# Patient Record
Sex: Female | Born: 1955 | Race: White | Hispanic: No | Marital: Married | Smoking: Never smoker
Health system: Southern US, Community
[De-identification: ages and names within clinical notes are randomized; demographics above are authoritative.]

## PROBLEM LIST (undated history)

## (undated) DIAGNOSIS — F419 Anxiety disorder, unspecified: Secondary | ICD-10-CM

## (undated) DIAGNOSIS — I10 Essential (primary) hypertension: Secondary | ICD-10-CM

## (undated) DIAGNOSIS — Z8719 Personal history of other diseases of the digestive system: Secondary | ICD-10-CM

## (undated) DIAGNOSIS — F329 Major depressive disorder, single episode, unspecified: Secondary | ICD-10-CM

## (undated) DIAGNOSIS — M1990 Unspecified osteoarthritis, unspecified site: Secondary | ICD-10-CM

## (undated) DIAGNOSIS — E039 Hypothyroidism, unspecified: Secondary | ICD-10-CM

## (undated) DIAGNOSIS — E7800 Pure hypercholesterolemia, unspecified: Secondary | ICD-10-CM

## (undated) DIAGNOSIS — K219 Gastro-esophageal reflux disease without esophagitis: Secondary | ICD-10-CM

## (undated) DIAGNOSIS — F32A Depression, unspecified: Secondary | ICD-10-CM

## (undated) HISTORY — PX: JOINT REPLACEMENT: SHX530

## (undated) HISTORY — PX: DILATION AND CURETTAGE OF UTERUS: SHX78

## (undated) HISTORY — PX: OTHER SURGICAL HISTORY: SHX169

## (undated) HISTORY — PX: TUBAL LIGATION: SHX77

## (undated) HISTORY — PX: CARDIAC CATHETERIZATION: SHX172

## (undated) HISTORY — PX: ABDOMINAL HYSTERECTOMY: SHX81

## (undated) HISTORY — PX: BREAST LUMPECTOMY: SHX2

---

## 1999-04-09 ENCOUNTER — Other Ambulatory Visit: Admission: RE | Admit: 1999-04-09 | Discharge: 1999-04-09 | Payer: Self-pay | Admitting: Family Medicine

## 2001-02-12 ENCOUNTER — Other Ambulatory Visit: Admission: RE | Admit: 2001-02-12 | Discharge: 2001-02-12 | Payer: Self-pay | Admitting: Family Medicine

## 2003-07-18 ENCOUNTER — Other Ambulatory Visit: Admission: RE | Admit: 2003-07-18 | Discharge: 2003-07-18 | Payer: Self-pay | Admitting: Family Medicine

## 2005-01-24 ENCOUNTER — Other Ambulatory Visit: Admission: RE | Admit: 2005-01-24 | Discharge: 2005-01-24 | Payer: Self-pay | Admitting: Family Medicine

## 2006-02-07 ENCOUNTER — Other Ambulatory Visit: Admission: RE | Admit: 2006-02-07 | Discharge: 2006-02-07 | Payer: Self-pay | Admitting: Family Medicine

## 2008-06-17 ENCOUNTER — Encounter: Payer: Self-pay | Admitting: Cardiology

## 2008-12-10 ENCOUNTER — Encounter: Payer: Self-pay | Admitting: Cardiology

## 2008-12-19 ENCOUNTER — Ambulatory Visit: Payer: Self-pay | Admitting: Cardiology

## 2008-12-19 DIAGNOSIS — R079 Chest pain, unspecified: Secondary | ICD-10-CM | POA: Insufficient documentation

## 2008-12-19 DIAGNOSIS — I10 Essential (primary) hypertension: Secondary | ICD-10-CM | POA: Insufficient documentation

## 2008-12-25 ENCOUNTER — Telehealth (INDEPENDENT_AMBULATORY_CARE_PROVIDER_SITE_OTHER): Payer: Self-pay | Admitting: *Deleted

## 2008-12-25 LAB — CONVERTED CEMR LAB
TSH: 0.56 microintl units/mL (ref 0.35–5.50)
Total CHOL/HDL Ratio: 5

## 2008-12-29 ENCOUNTER — Ambulatory Visit: Payer: Self-pay

## 2008-12-29 ENCOUNTER — Ambulatory Visit: Payer: Self-pay | Admitting: Cardiology

## 2008-12-29 ENCOUNTER — Encounter (HOSPITAL_COMMUNITY): Admission: RE | Admit: 2008-12-29 | Discharge: 2008-12-29 | Payer: Self-pay | Admitting: Cardiology

## 2008-12-31 ENCOUNTER — Encounter: Payer: Self-pay | Admitting: Cardiology

## 2009-01-01 ENCOUNTER — Ambulatory Visit: Payer: Self-pay | Admitting: Cardiology

## 2009-01-01 DIAGNOSIS — R9439 Abnormal result of other cardiovascular function study: Secondary | ICD-10-CM | POA: Insufficient documentation

## 2009-01-01 LAB — CONVERTED CEMR LAB
Basophils Relative: 0.9 % (ref 0.0–3.0)
CO2: 29 meq/L (ref 19–32)
Chloride: 98 meq/L (ref 96–112)
Eosinophils Absolute: 0.3 10*3/uL (ref 0.0–0.7)
Eosinophils Relative: 3.9 % (ref 0.0–5.0)
Hemoglobin: 13.2 g/dL (ref 12.0–15.0)
INR: 1 (ref 0.8–1.0)
MCHC: 33.4 g/dL (ref 30.0–36.0)
MCV: 85.8 fL (ref 78.0–100.0)
Monocytes Absolute: 0.5 10*3/uL (ref 0.1–1.0)
Neutro Abs: 3.9 10*3/uL (ref 1.4–7.7)
Potassium: 3.6 meq/L (ref 3.5–5.1)
RBC: 4.63 M/uL (ref 3.87–5.11)
Sodium: 135 meq/L (ref 135–145)
WBC: 6.8 10*3/uL (ref 4.5–10.5)

## 2009-01-05 ENCOUNTER — Inpatient Hospital Stay (HOSPITAL_BASED_OUTPATIENT_CLINIC_OR_DEPARTMENT_OTHER): Admission: RE | Admit: 2009-01-05 | Discharge: 2009-01-05 | Payer: Self-pay | Admitting: Cardiology

## 2009-01-05 ENCOUNTER — Ambulatory Visit: Payer: Self-pay | Admitting: Cardiology

## 2009-01-12 ENCOUNTER — Ambulatory Visit: Payer: Self-pay | Admitting: Cardiology

## 2012-01-30 ENCOUNTER — Encounter (INDEPENDENT_AMBULATORY_CARE_PROVIDER_SITE_OTHER): Payer: Self-pay | Admitting: *Deleted

## 2012-03-09 ENCOUNTER — Other Ambulatory Visit (INDEPENDENT_AMBULATORY_CARE_PROVIDER_SITE_OTHER): Payer: Self-pay | Admitting: *Deleted

## 2012-03-09 ENCOUNTER — Telehealth (INDEPENDENT_AMBULATORY_CARE_PROVIDER_SITE_OTHER): Payer: Self-pay | Admitting: *Deleted

## 2012-03-09 ENCOUNTER — Encounter (INDEPENDENT_AMBULATORY_CARE_PROVIDER_SITE_OTHER): Payer: Self-pay | Admitting: *Deleted

## 2012-03-09 DIAGNOSIS — Z1211 Encounter for screening for malignant neoplasm of colon: Secondary | ICD-10-CM

## 2012-03-09 MED ORDER — PEG-KCL-NACL-NASULF-NA ASC-C 100 G PO SOLR: 1.0000 | Freq: Once | ORAL | Status: DC

## 2012-03-09 NOTE — Telephone Encounter (Signed)
Patient needs movi prep 

## 2012-05-02 ENCOUNTER — Telehealth (INDEPENDENT_AMBULATORY_CARE_PROVIDER_SITE_OTHER): Payer: Self-pay | Admitting: *Deleted

## 2012-05-02 NOTE — Telephone Encounter (Signed)
  Procedure: tcs  Reason/Indication:  screening  Has patient had this procedure before?  no  If so, when, by whom and where?    Is there a family history of colon cancer?  no  Who?  What age when diagnosed?    Is patient diabetic?   no      Does patient have prosthetic heart valve?  no  Do you have a pacemaker?  no  Has patient had joint replacement within last 12 months?  no  Is patient on Coumadin, Plavix and/or Aspirin? yes  Medications: asa prn, advil, escitalopram 20 mg daily, atorvastatin 40 mg daily, benazepril/HCTZ 20/25 mg daily, levothyroxine 137 mg daily, clorazepate 3.75 mg daily, super B complex daily  Allergies: sulfur  Medication Adjustment: asa 2 days  Procedure date & time: 05/30/12 at 830

## 2012-05-02 NOTE — Telephone Encounter (Signed)
agree

## 2012-05-14 ENCOUNTER — Encounter (INDEPENDENT_AMBULATORY_CARE_PROVIDER_SITE_OTHER): Payer: Self-pay | Admitting: *Deleted

## 2012-05-29 ENCOUNTER — Encounter (HOSPITAL_COMMUNITY): Payer: Self-pay | Admitting: Pharmacy Technician

## 2012-05-30 ENCOUNTER — Encounter (HOSPITAL_COMMUNITY)
Admission: RE | Disposition: A | Discharge status: Home / Self Care | Payer: Self-pay | Source: Ambulatory Visit | Attending: Internal Medicine

## 2012-05-30 ENCOUNTER — Ambulatory Visit (HOSPITAL_COMMUNITY)
Admission: RE | Admit: 2012-05-30 | Discharge: 2012-05-30 | Disposition: A | Discharge status: Home / Self Care | Payer: BC Managed Care – PPO | Source: Ambulatory Visit | Attending: Internal Medicine | Admitting: Internal Medicine

## 2012-05-30 ENCOUNTER — Encounter (HOSPITAL_COMMUNITY): Payer: Self-pay | Admitting: *Deleted

## 2012-05-30 DIAGNOSIS — K644 Residual hemorrhoidal skin tags: Secondary | ICD-10-CM

## 2012-05-30 DIAGNOSIS — K5730 Diverticulosis of large intestine without perforation or abscess without bleeding: Secondary | ICD-10-CM

## 2012-05-30 DIAGNOSIS — D126 Benign neoplasm of colon, unspecified: Secondary | ICD-10-CM

## 2012-05-30 DIAGNOSIS — I10 Essential (primary) hypertension: Secondary | ICD-10-CM | POA: Insufficient documentation

## 2012-05-30 DIAGNOSIS — Z1211 Encounter for screening for malignant neoplasm of colon: Secondary | ICD-10-CM

## 2012-05-30 DIAGNOSIS — K645 Perianal venous thrombosis: Secondary | ICD-10-CM | POA: Insufficient documentation

## 2012-05-30 HISTORY — PX: COLONOSCOPY: SHX5424

## 2012-05-30 HISTORY — DX: Pure hypercholesterolemia, unspecified: E78.00

## 2012-05-30 HISTORY — DX: Essential (primary) hypertension: I10

## 2012-05-30 HISTORY — DX: Hypothyroidism, unspecified: E03.9

## 2012-05-30 HISTORY — DX: Major depressive disorder, single episode, unspecified: F32.9

## 2012-05-30 HISTORY — DX: Depression, unspecified: F32.A

## 2012-05-30 SURGERY — COLONOSCOPY
Anesthesia: Moderate Sedation

## 2012-05-30 MED ORDER — MIDAZOLAM HCL 5 MG/5ML IJ SOLN
INTRAMUSCULAR | Status: DC | PRN
  Administered 2012-05-30 (×2): 2 mg via INTRAVENOUS
  Administered 2012-05-30 (×2): 1 mg via INTRAVENOUS
  Administered 2012-05-30: 2 mg via INTRAVENOUS

## 2012-05-30 MED ORDER — SODIUM CHLORIDE 0.45 % IV SOLN: INTRAVENOUS | Status: DC

## 2012-05-30 MED ORDER — SODIUM CHLORIDE 0.9 % IV SOLN
INTRAVENOUS | Status: DC
  Administered 2012-05-30: 10:00:00 via INTRAVENOUS

## 2012-05-30 MED ORDER — MEPERIDINE HCL 50 MG/ML IJ SOLN
INTRAMUSCULAR | Status: AC
  Filled 2012-05-30: qty 1

## 2012-05-30 MED ORDER — MEPERIDINE HCL 50 MG/ML IJ SOLN
INTRAMUSCULAR | Status: DC | PRN
  Administered 2012-05-30 (×2): 25 mg via INTRAVENOUS

## 2012-05-30 MED ORDER — STERILE WATER FOR IRRIGATION IR SOLN
Status: DC | PRN
  Administered 2012-05-30: 10:00:00

## 2012-05-30 MED ORDER — MIDAZOLAM HCL 5 MG/5ML IJ SOLN
INTRAMUSCULAR | Status: AC
  Filled 2012-05-30: qty 10

## 2012-05-30 NOTE — Op Note (Signed)
COLONOSCOPY PROCEDURE REPORT  PATIENT:  Leslie Robbins  MR#:  [id number] Birthdate:  1955-09-30, 57 y.o., female Endoscopist:  Dr. Malissa Hippo, MD Referred By:  Ms. Bennie Pierini, FNP  Procedure Date: 05/30/2012  Procedure:   Colonoscopy  Indications:  Patient is 57 year old Caucasian female who is undergoing average risk screening colonoscopy. She has occasional hematochezia felt to be secondary to hemorrhoids.  Informed Consent:  The procedure and risks were reviewed with the patient and informed consent was obtained.  Medications:  Demerol 50 mg IV Versed 8 mg IV  Description of procedure:  After a digital rectal exam was performed, that colonoscope was advanced from the anus through the rectum and colon to the area of the cecum, ileocecal valve and appendiceal orifice. The cecum was deeply intubated. These structures were well-seen and photographed for the record. From the level of the cecum and ileocecal valve, the scope was slowly and cautiously withdrawn. The mucosal surfaces were carefully surveyed utilizing scope tip to flexion to facilitate fold flattening as needed. The scope was pulled down into the rectum where a thorough exam including retroflexion was performed.  Findings:   Prep excellent. Two small polyps ablated via cold biopsy and submitted together. These were located at cecum and ascending colon. Few diverticula at sigmoid colon. Normal rectal mucosa. Small hemorrhoids below the dentate line.    Therapeutic/Diagnostic Maneuvers Performed:  See above  Complications:  None  Cecal Withdrawal Time:  18 minutes  Impression:  Examination performed to cecum. Two small polyps ablated via cold biopsy and submitted together(cecum and ascending colon). Few diverticula at sigmoid colon. Small external hemorrhoids.  Recommendations:  Standard instructions given.  high fiber diet. I will contact patient with biopsy results and further  recommendations.  Fredderick Swanger U  05/30/2012 11:11 AM  CC: Dr. Bennie Pierini, FNP & Dr. Bonnetta Barry ref. provider found

## 2012-05-30 NOTE — H&P (Signed)
Leslie Robbins is an 57 y.o. female.   Chief Complaint: Patient is here for colonoscopy. HPI: Patient is 57 year old Caucasian female who is here for screening colonoscopy. She denies abdominal pain change in her bowel habits. She has occasional hematochezia. She had an episode one month ago another episode 6 months ago. She has good appetite and her weight has been stable. Family history is negative for colorectal carcinoma.  Past Medical History  Diagnosis Date  . Depression   . Hypothyroidism   . Hypertension   . Hypercholesteremia     Past Surgical History  Procedure Laterality Date  . Abdominal hysterectomy    . Tubal ligation    . Right carpal tunnel release    . Breast lumpectomy      benign    Family History  Problem Relation Age of Onset  . Colon cancer Neg Hx    Social History:  reports that she has never smoked. She does not have any smokeless tobacco history on file. She reports that she does not drink alcohol or use illicit drugs.  Allergies:  Allergies  Allergen Reactions  . Sulfonamide Derivatives     Medications Prior to Admission  Medication Sig Dispense Refill  . atorvastatin (LIPITOR) 40 MG tablet Take 40 mg by mouth at bedtime.      . benazepril-hydrochlorthiazide (LOTENSIN HCT) 20-25 MG per tablet Take 1 tablet by mouth daily.      Marland Kitchen escitalopram (LEXAPRO) 20 MG tablet Take 20 mg by mouth daily.      Marland Kitchen ibuprofen (ADVIL,MOTRIN) 200 MG tablet Take 600 mg by mouth daily.      Marland Kitchen levothyroxine (SYNTHROID, LEVOTHROID) 137 MCG tablet Take 137 mcg by mouth daily.      . peg 3350 powder (MOVIPREP) 100 G SOLR Take 1 kit (100 g total) by mouth once.  1 kit  0    No results found for this or any previous visit (from the past 48 hour(s)). No results found.  ROS  Blood pressure 154/86, pulse 68, temperature 97.6 F (36.4 C), temperature source Oral, resp. rate 20, height 5' 3.5" (1.613 m), weight 195 lb (88.451 kg), SpO2 95.00%. Physical Exam  Constitutional:  She appears well-developed and well-nourished.  HENT:  Mouth/Throat: Oropharynx is clear and moist.  Eyes: Conjunctivae are normal. No scleral icterus.  Neck: No thyromegaly present.  Cardiovascular: Normal rate, regular rhythm and normal heart sounds.   No murmur heard. Respiratory: Effort normal and breath sounds normal.  GI: Soft. Bowel sounds are normal.  Musculoskeletal: She exhibits no edema.  Lymphadenopathy:    She has no cervical adenopathy.  Neurological: She is alert.  Skin: Skin is warm and dry.     Assessment/Plan Average risk screening colonoscopy.  REHMAN,NAJEEB U 05/30/2012, 10:28 AM

## 2012-06-01 ENCOUNTER — Encounter (HOSPITAL_COMMUNITY): Payer: Self-pay | Admitting: Internal Medicine

## 2012-06-04 ENCOUNTER — Encounter (INDEPENDENT_AMBULATORY_CARE_PROVIDER_SITE_OTHER): Payer: Self-pay | Admitting: *Deleted

## 2012-06-11 ENCOUNTER — Other Ambulatory Visit: Payer: Self-pay | Admitting: *Deleted

## 2012-06-11 MED ORDER — ESCITALOPRAM OXALATE 20 MG PO TABS: 20.0000 mg | ORAL_TABLET | Freq: Every day | ORAL | Status: DC

## 2012-06-18 ENCOUNTER — Other Ambulatory Visit: Payer: Self-pay

## 2012-06-18 MED ORDER — LEVOTHYROXINE SODIUM 137 MCG PO TABS: 137.0000 ug | ORAL_TABLET | Freq: Every day | ORAL | Status: DC

## 2012-07-16 ENCOUNTER — Other Ambulatory Visit: Payer: Self-pay | Admitting: Nurse Practitioner

## 2012-07-16 NOTE — Telephone Encounter (Signed)
Patient last seen in office and had labs on 01-20-12. Please advise. Thank you

## 2012-08-12 ENCOUNTER — Other Ambulatory Visit: Payer: Self-pay | Admitting: Nurse Practitioner

## 2012-08-15 NOTE — Telephone Encounter (Signed)
Last seen 11/13.

## 2012-08-16 ENCOUNTER — Telehealth: Payer: Self-pay | Admitting: Nurse Practitioner

## 2012-08-16 MED ORDER — CLORAZEPATE DIPOTASSIUM 3.75 MG PO TABS: 3.7500 mg | ORAL_TABLET | Freq: Two times a day (BID) | ORAL | Status: DC | PRN

## 2012-08-16 NOTE — Telephone Encounter (Signed)
Paper chart says clorazepate 3.75 bid and last filled at [facility] in 09/2011

## 2012-08-16 NOTE — Telephone Encounter (Signed)
pleasecall in clorazepate rx with 0 refills

## 2012-08-16 NOTE — Telephone Encounter (Signed)
Not on med list meed to know when filled last and to what pharmacy

## 2012-08-16 NOTE — Telephone Encounter (Signed)
Rx called to [facility]. Patient notified

## 2012-08-31 ENCOUNTER — Other Ambulatory Visit: Payer: Self-pay | Admitting: Nurse Practitioner

## 2012-09-04 ENCOUNTER — Ambulatory Visit (INDEPENDENT_AMBULATORY_CARE_PROVIDER_SITE_OTHER): Payer: BC Managed Care – PPO

## 2012-09-04 ENCOUNTER — Ambulatory Visit (INDEPENDENT_AMBULATORY_CARE_PROVIDER_SITE_OTHER): Payer: BC Managed Care – PPO | Admitting: Physician Assistant

## 2012-09-04 ENCOUNTER — Encounter: Payer: Self-pay | Admitting: Physician Assistant

## 2012-09-04 VITALS — BP 147/83 | HR 62 | Temp 99.4°F | Ht 65.0 in | Wt 207.0 lb

## 2012-09-04 DIAGNOSIS — M25562 Pain in left knee: Secondary | ICD-10-CM

## 2012-09-04 DIAGNOSIS — M25569 Pain in unspecified knee: Secondary | ICD-10-CM

## 2012-09-04 MED ORDER — MELOXICAM 15 MG PO TABS: 15.0000 mg | ORAL_TABLET | Freq: Every day | ORAL | Status: DC

## 2012-09-04 NOTE — Patient Instructions (Signed)
Knee Pain  The knee is the complex joint between your thigh and your lower leg. It is made up of bones, tendons, ligaments, and cartilage. The bones that make up the knee are:   The femur in the thigh.   The tibia and fibula in the lower leg.   The patella or kneecap riding in the groove on the lower femur.  CAUSES   Knee pain is a common complaint with many causes. A few of these causes are:   Injury, such as:   A ruptured ligament or tendon injury.   Torn cartilage.   Medical conditions, such as:   Gout   Arthritis   Infections   Overuse, over training or overdoing a physical activity.  Knee pain can be minor or severe. Knee pain can accompany debilitating injury. Minor knee problems often respond well to self-care measures or get well on their own. More serious injuries may need medical intervention or even surgery.  SYMPTOMS  The knee is complex. Symptoms of knee problems can vary widely. Some of the problems are:   Pain with movement and weight bearing.   Swelling and tenderness.   Buckling of the knee.   Inability to straighten or extend your knee.   Your knee locks and you cannot straighten it.   Warmth and redness with pain and fever.   Deformity or dislocation of the kneecap.  DIAGNOSIS   Determining what is wrong may be very straight forward such as when there is an injury. It can also be challenging because of the complexity of the knee. Tests to make a diagnosis may include:   Your caregiver taking a history and doing a physical exam.   Routine X-rays can be used to rule out other problems. X-rays will not reveal a cartilage tear. Some injuries of the knee can be diagnosed by:   Arthroscopy a surgical technique by which a small video camera is inserted through tiny incisions on the sides of the knee. This procedure is used to examine and repair internal knee joint problems. Tiny instruments can be used during arthroscopy to repair the torn knee cartilage (meniscus).   Arthrography  is a radiology technique. A contrast liquid is directly injected into the knee joint. Internal structures of the knee joint then become visible on X-ray film.   An MRI scan is a non x-ray radiology procedure in which magnetic fields and a computer produce two- or three-dimensional images of the inside of the knee. Cartilage tears are often visible using an MRI scanner. MRI scans have largely replaced arthrography in diagnosing cartilage tears of the knee.   Blood work.   Examination of the fluid that helps to lubricate the knee joint (synovial fluid). This is done by taking a sample out using a needle and a syringe.  TREATMENT  The treatment of knee problems depends on the cause. Some of these treatments are:   Depending on the injury, proper casting, splinting, surgery or physical therapy care will be needed.   Give yourself adequate recovery time. Do not overuse your joints. If you begin to get sore during workout routines, back off. Slow down or do fewer repetitions.   For repetitive activities such as cycling or running, maintain your strength and nutrition.   Alternate muscle groups. For example if you are a weight lifter, work the upper body on one day and the lower body the next.   Either tight or weak muscles do not give the proper support for your   knee. Tight or weak muscles do not absorb the stress placed on the knee joint. Keep the muscles surrounding the knee strong.   Take care of mechanical problems.   If you have flat feet, orthotics or special shoes may help. See your caregiver if you need help.   Arch supports, sometimes with wedges on the inner or outer aspect of the heel, can help. These can shift pressure away from the side of the knee most bothered by osteoarthritis.   A brace called an "unloader" brace also may be used to help ease the pressure on the most arthritic side of the knee.   If your caregiver has prescribed crutches, braces, wraps or ice, use as directed. The acronym for  this is PRICE. This means protection, rest, ice, compression and elevation.   Nonsteroidal anti-inflammatory drugs (NSAID's), can help relieve pain. But if taken immediately after an injury, they may actually increase swelling. Take NSAID's with food in your stomach. Stop them if you develop stomach problems. Do not take these if you have a history of ulcers, stomach pain or bleeding from the bowel. Do not take without your caregiver's approval if you have problems with fluid retention, heart failure, or kidney problems.   For ongoing knee problems, physical therapy may be helpful.   Glucosamine and chondroitin are over-the-counter dietary supplements. Both may help relieve the pain of osteoarthritis in the knee. These medicines are different from the usual anti-inflammatory drugs. Glucosamine may decrease the rate of cartilage destruction.   Injections of a corticosteroid drug into your knee joint may help reduce the symptoms of an arthritis flare-up. They may provide pain relief that lasts a few months. You may have to wait a few months between injections. The injections do have a small increased risk of infection, water retention and elevated blood sugar levels.   Hyaluronic acid injected into damaged joints may ease pain and provide lubrication. These injections may work by reducing inflammation. A series of shots may give relief for as long as 6 months.   Topical painkillers. Applying certain ointments to your skin may help relieve the pain and stiffness of osteoarthritis. Ask your pharmacist for suggestions. Many over the-counter products are approved for temporary relief of arthritis pain.   In some countries, doctors often prescribe topical NSAID's for relief of chronic conditions such as arthritis and tendinitis. A review of treatment with NSAID creams found that they worked as well as oral medications but without the serious side effects.  PREVENTION   Maintain a healthy weight. Extra pounds put  more strain on your joints.   Get strong, stay limber. Weak muscles are a common cause of knee injuries. Stretching is important. Include flexibility exercises in your workouts.   Be smart about exercise. If you have osteoarthritis, chronic knee pain or recurring injuries, you may need to change the way you exercise. This does not mean you have to stop being active. If your knees ache after jogging or playing basketball, consider switching to swimming, water aerobics or other low-impact activities, at least for a few days a week. Sometimes limiting high-impact activities will provide relief.   Make sure your shoes fit well. Choose footwear that is right for your sport.   Protect your knees. Use the proper gear for knee-sensitive activities. Use kneepads when playing volleyball or laying carpet. Buckle your seat belt every time you drive. Most shattered kneecaps occur in car accidents.   Rest when you are tired.  SEEK MEDICAL CARE IF:     You have knee pain that is continual and does not seem to be getting better.   SEEK IMMEDIATE MEDICAL CARE IF:   Your knee joint feels hot to the touch and you have a high fever.  MAKE SURE YOU:    Understand these instructions.   Will watch your condition.   Will get help right away if you are not doing well or get worse.  Document Released: 12/19/2006 Document Revised: 05/16/2011 Document Reviewed: 12/19/2006  [employer] Patient Information 2014 [employer].

## 2012-09-04 NOTE — Progress Notes (Signed)
Subjective:     Patient ID: Leslie Robbins, female   DOB: 06-03-1955, 57 y.o.   MRN: [id number]  HPI Pt with intermit chronic medial L knee pain Sx improve when she is off of work Worsen w/in 2-3 days of being back on the job Denies any locking or giving way Sx are worse going down steps/hills She has used OTC NSAIDS and more recent OTC joint health med with some relief No prev surgery to the knee  Review of Systems  All other systems reviewed and are negative.       Objective:   Physical Exam  Nursing note and vitals reviewed. No effusion of the L knee Sl TTP along the medial joint line, no other TTP noted FROM of the knee with crepitus No laxity noted Lachman/[place] Scrape neg + Sx with patellar compression Good strength/sensory Xray- sig patellar and medial degen changes     Assessment:     1. Knee pain, left        Plan:     Heat/Ice Nl course reviewed with pt Can cont OTC med Mobic rx If sx cont will need referral to Ortho

## 2012-09-05 ENCOUNTER — Telehealth: Payer: Self-pay | Admitting: Physician Assistant

## 2012-09-05 DIAGNOSIS — M25562 Pain in left knee: Secondary | ICD-10-CM

## 2012-09-19 NOTE — Telephone Encounter (Signed)
Referral ordered. Patient aware.

## 2012-09-19 NOTE — Telephone Encounter (Signed)
Let her know I was out of the office last week so did not get message until today Please set up for Ortho

## 2012-10-05 ENCOUNTER — Other Ambulatory Visit: Payer: Self-pay

## 2012-10-05 DIAGNOSIS — M25562 Pain in left knee: Secondary | ICD-10-CM

## 2012-10-05 MED ORDER — MELOXICAM 15 MG PO TABS: 15.0000 mg | ORAL_TABLET | Freq: Every day | ORAL | Status: DC

## 2012-10-10 ENCOUNTER — Other Ambulatory Visit: Payer: Self-pay | Admitting: Nurse Practitioner

## 2012-10-23 ENCOUNTER — Encounter: Payer: Self-pay | Admitting: Nurse Practitioner

## 2012-10-23 ENCOUNTER — Ambulatory Visit (INDEPENDENT_AMBULATORY_CARE_PROVIDER_SITE_OTHER): Payer: BC Managed Care – PPO | Admitting: Nurse Practitioner

## 2012-10-23 VITALS — BP 139/85 | HR 60 | Temp 98.3°F | Ht 64.5 in | Wt 207.0 lb

## 2012-10-23 DIAGNOSIS — F32A Depression, unspecified: Secondary | ICD-10-CM

## 2012-10-23 DIAGNOSIS — F411 Generalized anxiety disorder: Secondary | ICD-10-CM

## 2012-10-23 DIAGNOSIS — E785 Hyperlipidemia, unspecified: Secondary | ICD-10-CM

## 2012-10-23 DIAGNOSIS — E039 Hypothyroidism, unspecified: Secondary | ICD-10-CM

## 2012-10-23 DIAGNOSIS — K219 Gastro-esophageal reflux disease without esophagitis: Secondary | ICD-10-CM

## 2012-10-23 DIAGNOSIS — Z0000 Encounter for general adult medical examination without abnormal findings: Secondary | ICD-10-CM

## 2012-10-23 DIAGNOSIS — F329 Major depressive disorder, single episode, unspecified: Secondary | ICD-10-CM

## 2012-10-23 DIAGNOSIS — I10 Essential (primary) hypertension: Secondary | ICD-10-CM

## 2012-10-23 LAB — POCT CBC
Hemoglobin: 13.9 g/dL (ref 12.2–16.2)
MPV: 8.3 fL (ref 0–99.8)
POC Granulocyte: 3 (ref 2–6.9)
POC LYMPH PERCENT: 40 %L (ref 10–50)
RBC: 4.8 M/uL (ref 4.04–5.48)

## 2012-10-23 MED ORDER — CLORAZEPATE DIPOTASSIUM 3.75 MG PO TABS: 3.7500 mg | ORAL_TABLET | Freq: Two times a day (BID) | ORAL | Status: DC | PRN

## 2012-10-23 MED ORDER — LEVOTHYROXINE SODIUM 137 MCG PO TABS: 137.0000 ug | ORAL_TABLET | Freq: Every day | ORAL | Status: DC

## 2012-10-23 MED ORDER — BENAZEPRIL-HYDROCHLOROTHIAZIDE 20-25 MG PO TABS: 1.0000 | ORAL_TABLET | Freq: Every day | ORAL | Status: DC

## 2012-10-23 MED ORDER — ESCITALOPRAM OXALATE 20 MG PO TABS: 20.0000 mg | ORAL_TABLET | Freq: Every day | ORAL | Status: DC

## 2012-10-23 MED ORDER — ATORVASTATIN CALCIUM 40 MG PO TABS: 40.0000 mg | ORAL_TABLET | Freq: Every day | ORAL | Status: DC

## 2012-10-23 MED ORDER — OMEPRAZOLE 40 MG PO CPDR: 40.0000 mg | DELAYED_RELEASE_CAPSULE | Freq: Every day | ORAL | Status: DC

## 2012-10-23 NOTE — Patient Instructions (Signed)

## 2012-10-23 NOTE — Progress Notes (Signed)
Subjective:    Patient ID: Leslie Robbins, female    DOB: 12-30-1955, 57 y.o.   MRN: [id number]   Patient in today for annual physical exam- No PAP- She is doing well  Hypertension This is a chronic problem. The current episode started more than 1 year ago. The problem is unchanged. The problem is controlled. Associated symptoms include anxiety. Pertinent negatives include no blurred vision, chest pain, neck pain, palpitations, peripheral edema or shortness of breath. There are no associated agents to hypertension. Risk factors for coronary artery disease include dyslipidemia, family history, obesity and post-menopausal state. Past treatments include ACE inhibitors and diuretics. The current treatment provides moderate improvement. Compliance problems include diet and exercise.  Hypertensive end-organ damage includes a thyroid problem.  Hyperlipidemia This is a chronic problem. The current episode started more than 1 year ago. The problem is controlled. Recent lipid tests were reviewed and are normal. Exacerbating diseases include hypothyroidism and obesity. She has no history of diabetes. Factors aggravating her hyperlipidemia include thiazides. Pertinent negatives include no chest pain, leg pain or shortness of breath. Current antihyperlipidemic treatment includes statins. The current treatment provides moderate improvement of lipids. Compliance problems include adherence to diet and adherence to exercise.  Risk factors for coronary artery disease include family history, hypertension, obesity and post-menopausal.  Thyroid Problem Presents for follow-up (hypothyroidism) visit. Patient reports no anxiety, depressed mood, diaphoresis, diarrhea, dry skin, fatigue, heat intolerance, menstrual problem, palpitations, visual change, weight gain or weight loss. The symptoms have been stable. Her past medical history is significant for hyperlipidemia. There is no history of diabetes.  Depression/GAD Currently on  lexaprodaily and clorazepate Qd-BID- Patient doing well right now- says that she has had no recent anxiety attacks.   Review of Systems  Constitutional: Negative for weight loss, weight gain, diaphoresis and fatigue.  HENT: Negative for neck pain.   Eyes: Negative for blurred vision.  Respiratory: Negative for shortness of breath.   Cardiovascular: Negative for chest pain and palpitations.  Gastrointestinal: Negative for diarrhea.  Endocrine: Negative for heat intolerance.  Genitourinary: Negative for menstrual problem.  All other systems reviewed and are negative.       Objective:   Physical Exam  Constitutional: She is oriented to person, place, and time. She appears well-developed and well-nourished.  HENT:  Nose: Nose normal.  Mouth/Throat: Oropharynx is clear and moist.  Eyes: EOM are normal.  Neck: Trachea normal, normal range of motion and full passive range of motion without pain. Neck supple. No JVD present. Carotid bruit is not present. No thyromegaly present.  Cardiovascular: Normal rate, regular rhythm, normal heart sounds and intact distal pulses.  Exam reveals no gallop and no friction rub.   No murmur heard. Pulmonary/Chest: Effort normal and breath sounds normal.  Abdominal: Soft. Bowel sounds are normal. She exhibits no distension and no mass. There is no tenderness.  Musculoskeletal: Normal range of motion.  Lymphadenopathy:    She has no cervical adenopathy.  Neurological: She is alert and oriented to person, place, and time. She has normal reflexes.  Skin: Skin is warm and dry.  Psychiatric: She has a normal mood and affect. Her behavior is normal. Judgment and thought content normal.   BP 139/85  Pulse 60  Temp(Src) 98.3 F (36.8 C) (Oral)  Ht 5' 4.5" (1.638 m)  Wt 207 lb (93.895 kg)  BMI 35 kg/m2        Assessment & Plan:  1. Annual physical exam  - POCT CBC - Thyroid  Panel With TSH  2. Hyperlipidemia Low fat diet and exercsie - NMR,  lipoprofile - atorvastatin (LIPITOR) 40 MG tablet; Take 1 tablet (40 mg total) by mouth daily.  Dispense: 30 tablet; Refill: 5  3. Hypertension Low NA+ diet - CMP14+EGFR - benazepril-hydrochlorthiazide (LOTENSIN HCT) 20-25 MG per tablet; Take 1 tablet by mouth daily.  Dispense: 30 tablet; Refill: 5  4. Depression Stress management - escitalopram (LEXAPRO) 20 MG tablet; Take 1 tablet (20 mg total) by mouth daily.  Dispense: 30 tablet; Refill: 5  5. GAD (generalized anxiety disorder) Stress menagement - clorazepate (TRANXENE) 3.75 MG tablet; Take 1 tablet (3.75 mg total) by mouth 2 (two) times daily as needed for anxiety.  Dispense: 30 tablet; Refill: 5  6. GERD (gastroesophageal reflux disease) AVoid spicy and fatty foods Do no eat 2 hours prior to bedtime - omeprazole (PRILOSEC) 40 MG capsule; Take 1 capsule (40 mg total) by mouth daily.  Dispense: 30 capsule; Refill: 5  7. Hypothyroidism  - levothyroxine (SYNTHROID, LEVOTHROID) 137 MCG tablet; Take 1 tablet (137 mcg total) by mouth daily.  Dispense: 30 tablet; Refill: 6  Health maintenance reviewed  Mary-Margaret Daphine Deutscher, FNP

## 2012-10-24 LAB — CMP14+EGFR
ALT: 339 IU/L — ABNORMAL HIGH (ref 0–32)
Albumin/Globulin Ratio: 1.4 (ref 1.1–2.5)
CO2: 26 mmol/L (ref 18–29)
Calcium: 9.8 mg/dL (ref 8.7–10.2)
Creatinine, Ser: 1.03 mg/dL — ABNORMAL HIGH (ref 0.57–1.00)
GFR calc non Af Amer: 60 mL/min/{1.73_m2} (ref >59)
Globulin, Total: 2.8 g/dL (ref 1.5–4.5)
Glucose: 69 mg/dL (ref 65–99)
Potassium: 4.5 mmol/L (ref 3.5–5.2)
Total Protein: 6.8 g/dL (ref 6.0–8.5)

## 2012-10-24 LAB — NMR, LIPOPROFILE
HDL Cholesterol by NMR: 54 mg/dL (ref >=40)
LDLC SERPL CALC-MCNC: 98 mg/dL (ref <100)
Small LDL Particle Number: 382 nmol/L (ref <=527)
Triglycerides by NMR: 95 mg/dL (ref <150)

## 2012-10-24 LAB — THYROID PANEL WITH TSH
Free Thyroxine Index: 3 (ref 1.2–4.9)
T3 Uptake Ratio: 25 % (ref 24–39)
T4, Total: 11.8 ug/dL (ref 4.5–12.0)

## 2013-01-06 ENCOUNTER — Other Ambulatory Visit: Payer: Self-pay | Admitting: Nurse Practitioner

## 2013-01-06 ENCOUNTER — Other Ambulatory Visit: Payer: Self-pay | Admitting: Physician Assistant

## 2013-03-11 ENCOUNTER — Other Ambulatory Visit: Payer: Self-pay | Admitting: Nurse Practitioner

## 2013-04-16 ENCOUNTER — Other Ambulatory Visit: Payer: Self-pay | Admitting: Nurse Practitioner

## 2013-04-30 ENCOUNTER — Other Ambulatory Visit: Payer: Self-pay | Admitting: Nurse Practitioner

## 2013-05-28 ENCOUNTER — Telehealth: Payer: Self-pay | Admitting: Nurse Practitioner

## 2013-05-28 NOTE — Telephone Encounter (Signed)
OTC mucinex and motrin

## 2013-05-29 ENCOUNTER — Encounter: Payer: Self-pay | Admitting: General Practice

## 2013-05-29 ENCOUNTER — Ambulatory Visit (INDEPENDENT_AMBULATORY_CARE_PROVIDER_SITE_OTHER): Payer: BC Managed Care – PPO | Admitting: Internal Medicine

## 2013-05-29 ENCOUNTER — Ambulatory Visit (INDEPENDENT_AMBULATORY_CARE_PROVIDER_SITE_OTHER): Payer: BC Managed Care – PPO | Admitting: General Practice

## 2013-05-29 VITALS — BP 117/83 | HR 76 | Temp 99.1°F | Ht 64.5 in | Wt 198.2 lb

## 2013-05-29 DIAGNOSIS — R059 Cough, unspecified: Secondary | ICD-10-CM

## 2013-05-29 DIAGNOSIS — J0100 Acute maxillary sinusitis, unspecified: Secondary | ICD-10-CM

## 2013-05-29 DIAGNOSIS — J101 Influenza due to other identified influenza virus with other respiratory manifestations: Secondary | ICD-10-CM

## 2013-05-29 DIAGNOSIS — R05 Cough: Secondary | ICD-10-CM

## 2013-05-29 DIAGNOSIS — J111 Influenza due to unidentified influenza virus with other respiratory manifestations: Secondary | ICD-10-CM

## 2013-05-29 DIAGNOSIS — R509 Fever, unspecified: Secondary | ICD-10-CM

## 2013-05-29 LAB — POCT RAPID STREP A (OFFICE): RAPID STREP A SCREEN: NEGATIVE

## 2013-05-29 LAB — POCT INFLUENZA A/B
INFLUENZA A, POC: NEGATIVE
INFLUENZA B, POC: POSITIVE

## 2013-05-29 MED ORDER — OSELTAMIVIR PHOSPHATE 75 MG PO CAPS: 75.0000 mg | ORAL_CAPSULE | Freq: Two times a day (BID) | ORAL | Status: DC

## 2013-05-29 MED ORDER — BENZONATATE 100 MG PO CAPS: 100.0000 mg | ORAL_CAPSULE | Freq: Three times a day (TID) | ORAL | Status: DC | PRN

## 2013-05-29 MED ORDER — AZITHROMYCIN 250 MG PO TABS: ORAL_TABLET | ORAL | Status: DC

## 2013-05-29 NOTE — Telephone Encounter (Signed)
Pt here now for appt for this

## 2013-05-29 NOTE — Progress Notes (Signed)
Subjective:    Patient ID: Leslie Robbins, female    DOB: 1955-04-11, 58 y.o.   MRN: [id number]  Cough This is a new problem. The current episode started in the past 7 days. The problem has been unchanged. The problem occurs every few minutes. The cough is non-productive. Associated symptoms include chills, a fever, headaches, nasal congestion, postnasal drip and a sore throat. Pertinent negatives include no chest pain, hemoptysis, shortness of breath or wheezing. The symptoms are aggravated by lying down. She has tried OTC cough suppressant for the symptoms. There is no history of asthma, bronchitis or pneumonia.  Fever  This is a new problem. The current episode started in the past 7 days. The problem occurs daily. The problem has been gradually improving. The maximum temperature noted was 101 to 101.9 F. The temperature was taken using an oral thermometer. Associated symptoms include coughing, headaches, muscle aches and a sore throat. Pertinent negatives include no chest pain, nausea, vomiting or wheezing.  Sore Throat  This is a new problem. The current episode started in the past 7 days. The problem has been gradually improving. Neither side of throat is experiencing more pain than the other. The pain is at a severity of 3/10. Associated symptoms include coughing and headaches. Pertinent negatives include no drooling, neck pain, shortness of breath or vomiting. She has had no exposure to strep or mono.      Review of Systems  Constitutional: Positive for fever and chills.  HENT: Positive for postnasal drip, sinus pressure and sore throat. Negative for drooling.   Respiratory: Positive for cough. Negative for hemoptysis, chest tightness, shortness of breath and wheezing.   Cardiovascular: Negative for chest pain.  Gastrointestinal: Negative for nausea and vomiting.  Musculoskeletal: Negative for neck pain.  Neurological: Positive for headaches.       Objective:   Physical Exam    Constitutional: She is oriented to person, place, and time. She appears well-developed and well-nourished.  HENT:  Head: Normocephalic and atraumatic.  Right Ear: External ear normal.  Left Ear: External ear normal.  Nose: Right sinus exhibits maxillary sinus tenderness. Left sinus exhibits maxillary sinus tenderness.  Pulmonary/Chest: Effort normal and breath sounds normal. No respiratory distress. She exhibits no tenderness.  Neurological: She is alert and oriented to person, place, and time.  Skin: Skin is warm and dry.  Psychiatric: She has a normal mood and affect.      Results for orders placed in visit on 05/29/13  POCT RAPID STREP A (OFFICE)      Result Value Ref Range   Rapid Strep A Screen Negative  Negative  POCT INFLUENZA A/B      Result Value Ref Range   Influenza A, POC Negative     Influenza B, POC Positive         Assessment & Plan:  1. Fever  - POCT rapid strep A - POCT Influenza A/B  2. Influenza B  - oseltamivir (TAMIFLU) 75 MG capsule; Take 1 capsule (75 mg total) by mouth 2 (two) times daily.  Dispense: 10 capsule; Refill: 0  3. Sinusitis, acute maxillary  - azithromycin (ZITHROMAX) 250 MG tablet; Take as directed  Dispense: 6 tablet; Refill: 0  4. Cough  - benzonatate (TESSALON) 100 MG capsule; Take 1 capsule (100 mg total) by mouth 3 (three) times daily as needed.  Dispense: 30 capsule; Refill: 0 -discussed adequate hydration -RTO if symptoms worsen or unresolved Patient verbalized understanding Erby Pian, FNP-C

## 2013-05-29 NOTE — Patient Instructions (Signed)

## 2013-06-04 ENCOUNTER — Other Ambulatory Visit: Payer: Self-pay | Admitting: General Practice

## 2013-06-13 ENCOUNTER — Other Ambulatory Visit: Payer: Self-pay | Admitting: *Deleted

## 2013-06-13 DIAGNOSIS — F32A Depression, unspecified: Secondary | ICD-10-CM

## 2013-06-13 DIAGNOSIS — F329 Major depressive disorder, single episode, unspecified: Secondary | ICD-10-CM

## 2013-06-13 MED ORDER — ESCITALOPRAM OXALATE 20 MG PO TABS: 20.0000 mg | ORAL_TABLET | Freq: Every day | ORAL | Status: DC

## 2013-07-11 ENCOUNTER — Encounter: Payer: Self-pay | Admitting: Nurse Practitioner

## 2013-07-11 ENCOUNTER — Ambulatory Visit (INDEPENDENT_AMBULATORY_CARE_PROVIDER_SITE_OTHER): Payer: BC Managed Care – PPO | Admitting: Nurse Practitioner

## 2013-07-11 ENCOUNTER — Ambulatory Visit (INDEPENDENT_AMBULATORY_CARE_PROVIDER_SITE_OTHER): Payer: BC Managed Care – PPO

## 2013-07-11 VITALS — BP 118/75 | HR 63 | Temp 98.4°F | Ht 64.5 in | Wt 200.8 lb

## 2013-07-11 DIAGNOSIS — F341 Dysthymic disorder: Secondary | ICD-10-CM

## 2013-07-11 DIAGNOSIS — F418 Other specified anxiety disorders: Secondary | ICD-10-CM

## 2013-07-11 DIAGNOSIS — I10 Essential (primary) hypertension: Secondary | ICD-10-CM

## 2013-07-11 DIAGNOSIS — Z01818 Encounter for other preprocedural examination: Secondary | ICD-10-CM

## 2013-07-11 DIAGNOSIS — F411 Generalized anxiety disorder: Secondary | ICD-10-CM

## 2013-07-11 DIAGNOSIS — K219 Gastro-esophageal reflux disease without esophagitis: Secondary | ICD-10-CM

## 2013-07-11 DIAGNOSIS — E039 Hypothyroidism, unspecified: Secondary | ICD-10-CM

## 2013-07-11 DIAGNOSIS — E785 Hyperlipidemia, unspecified: Secondary | ICD-10-CM

## 2013-07-11 LAB — POCT CBC
Granulocyte percent: 71.1 %G (ref 37–80)
HEMATOCRIT: 38.8 % (ref 37.7–47.9)
HEMOGLOBIN: 13.4 g/dL (ref 12.2–16.2)
Lymph, poc: 1.9 (ref 0.6–3.4)
MCH, POC: 29.9 pg (ref 27–31.2)
MCHC: 34.5 g/dL (ref 31.8–35.4)
MCV: 86.6 fL (ref 80–97)
MPV: 8.3 fL (ref 0–99.8)
POC GRANULOCYTE: 5 (ref 2–6.9)
POC LYMPH PERCENT: 26.9 %L (ref 10–50)
Platelet Count, POC: 206 10*3/uL (ref 142–424)
RBC: 4.5 M/uL (ref 4.04–5.48)
RDW, POC: 14.1 %
WBC: 7.1 10*3/uL (ref 4.6–10.2)

## 2013-07-11 NOTE — Progress Notes (Signed)
Subjective:    Patient ID: Leslie Robbins, female    DOB: August 30, 1955, 58 y.o.   MRN: [id number]  HPI Patient in today for surgical clearance- SHe is due to have a total knee replacement in august or 2015. SHe has no complaints today.  Hypertension This is a chronic problem. The current episode started more than 1 year ago. The problem is unchanged. The problem is controlled. Associated symptoms include anxiety. Pertinent negatives include no blurred vision, chest pain, neck pain, palpitations, peripheral edema or shortness of breath. There are no associated agents to hypertension. Risk factors for coronary artery disease include dyslipidemia, family history, obesity and post-menopausal state. Past treatments include ACE inhibitors and diuretics. The current treatment provides moderate improvement. Compliance problems include diet and exercise.  Hypertensive end-organ damage includes a thyroid problem.  Hyperlipidemia This is a chronic problem. The current episode started more than 1 year ago. The problem is controlled. Recent lipid tests were reviewed and are normal. Exacerbating diseases include hypothyroidism and obesity. She has no history of diabetes. Factors aggravating her hyperlipidemia include thiazides. Pertinent negatives include no chest pain, leg pain or shortness of breath. Current antihyperlipidemic treatment includes statins. The current treatment provides moderate improvement of lipids. Compliance problems include adherence to diet and adherence to exercise.  Risk factors for coronary artery disease include family history, hypertension, obesity and post-menopausal.  Thyroid Problem Presents for follow-up (hypothyroidism) visit. Patient reports no anxiety, depressed mood, diaphoresis, diarrhea, dry skin, fatigue, heat intolerance, menstrual problem, palpitations, visual change, weight gain or weight loss. The symptoms have been stable. Her past medical history is significant for  hyperlipidemia. There is no history of diabetes.  Depression/GAD Currently on lexapro daily and clorazepate Qd-BID- Patient doing well right now- says that she has had no recent anxiety attacks. GERD Omeprazole- works well to keep symptoms under control.  Review of Systems  Constitutional: Negative.   HENT: Negative.   Respiratory: Negative.  Negative for shortness of breath and wheezing.   Cardiovascular: Negative for chest pain and palpitations.  Gastrointestinal: Negative.   Genitourinary: Negative.   Neurological: Negative.   Psychiatric/Behavioral: Negative.   All other systems reviewed and are negative.      Objective:   Physical Exam  Constitutional: She is oriented to person, place, and time. She appears well-developed and well-nourished.  HENT:  Nose: Nose normal.  Mouth/Throat: Oropharynx is clear and moist.  Eyes: EOM are normal.  Neck: Trachea normal, normal range of motion and full passive range of motion without pain. Neck supple. No JVD present. Carotid bruit is not present. No thyromegaly present.  Cardiovascular: Normal rate, regular rhythm, normal heart sounds and intact distal pulses.  Exam reveals no gallop and no friction rub.   No murmur heard. Pulmonary/Chest: Effort normal and breath sounds normal.  Abdominal: Soft. Bowel sounds are normal. She exhibits no distension and no mass. There is no tenderness.  Musculoskeletal: Normal range of motion.  Lymphadenopathy:    She has no cervical adenopathy.  Neurological: She is alert and oriented to person, place, and time. She has normal reflexes.  Skin: Skin is warm and dry.  Psychiatric: She has a normal mood and affect. Her behavior is normal. Judgment and thought content normal.   BP 118/75  Pulse 63  Temp(Src) 98.4 F (36.9 C) (Oral)  Ht 5' 4.5" (1.638 m)  Wt 200 lb 12.8 oz (91.082 kg)  BMI 33.95 kg/m2 Chest x ray- bronchitic changes EKG- Sinus bradycardia  Assessment & Plan:   1.  Pre-operative clearance   2. GERD (gastroesophageal reflux disease)   3. GAD (generalized anxiety disorder)   4. Depression with anxiety   5. Hyperlipidemia LDL goal < 100   6. Hypothyroidism   7. ESSENTIAL HYPERTENSION    Orders Placed This Encounter  Procedures  . DG Chest 2 View    Standing Status: Future     Number of Occurrences: 1     Standing Expiration Date: 09/10/2014    Order Specific Question:  Reason for Exam (SYMPTOM  OR DIAGNOSIS REQUIRED)    Answer:  preop    Order Specific Question:  Is the patient pregnant?    Answer:  No    Order Specific Question:  Preferred imaging location?    Answer:  Internal  . CMP14+EGFR  . NMR, lipoprofile  . Thyroid Panel With TSH  . POCT CBC  . EKG 12-Lead    Labs pending Health maintenance reviewed Diet and exercise encouraged Continue all meds Follow up  In 6 months   [place], FNP

## 2013-07-11 NOTE — Patient Instructions (Signed)

## 2013-07-12 LAB — CMP14+EGFR
ALK PHOS: 134 IU/L — AB (ref 39–117)
ALT: 37 IU/L — ABNORMAL HIGH (ref 0–32)
AST: 41 IU/L — ABNORMAL HIGH (ref 0–40)
Albumin/Globulin Ratio: 1.5 (ref 1.1–2.5)
Albumin: 4.2 g/dL (ref 3.6–4.8)
BUN/Creatinine Ratio: 22 (ref 11–26)
BUN: 22 mg/dL (ref 8–27)
CHLORIDE: 102 mmol/L (ref 97–108)
CO2: 27 mmol/L (ref 18–29)
Calcium: 9.5 mg/dL (ref 8.7–10.3)
Creatinine, Ser: 0.99 mg/dL (ref 0.57–1.00)
GFR calc Af Amer: 72 mL/min/{1.73_m2} (ref >59)
GFR calc non Af Amer: 62 mL/min/{1.73_m2} (ref >59)
Globulin, Total: 2.8 g/dL (ref 1.5–4.5)
Glucose: 84 mg/dL (ref 65–99)
Potassium: 3.7 mmol/L (ref 3.5–5.2)
SODIUM: 142 mmol/L (ref 134–144)
Total Bilirubin: 0.8 mg/dL (ref 0.0–1.2)
Total Protein: 7 g/dL (ref 6.0–8.5)

## 2013-07-12 LAB — NMR, LIPOPROFILE
Cholesterol: 148 mg/dL (ref <200)
HDL Cholesterol by NMR: 52 mg/dL (ref >=40)
HDL PARTICLE NUMBER: 31.9 umol/L (ref >=30.5)
LDL PARTICLE NUMBER: 981 nmol/L (ref <1000)
LDL Size: 21.2 nm (ref >20.5)
LDLC SERPL CALC-MCNC: 80 mg/dL (ref <100)
Small LDL Particle Number: 313 nmol/L (ref <=527)
Triglycerides by NMR: 81 mg/dL (ref <150)

## 2013-07-12 LAB — THYROID PANEL WITH TSH
FREE THYROXINE INDEX: 3 (ref 1.2–4.9)
T3 Uptake Ratio: 30 % (ref 24–39)
T4, Total: 10 ug/dL (ref 4.5–12.0)
TSH: 1.28 u[IU]/mL (ref 0.450–4.500)

## 2013-08-19 ENCOUNTER — Other Ambulatory Visit: Payer: Self-pay | Admitting: Nurse Practitioner

## 2013-08-21 NOTE — Telephone Encounter (Signed)
Phoned refill request into pharmacy

## 2013-08-21 NOTE — Telephone Encounter (Signed)
Last refilled 04/18/13. Last office visit 07/11/13.

## 2013-08-21 NOTE — Telephone Encounter (Signed)
Please call in clorazepate with 1p refills

## 2013-08-27 NOTE — Progress Notes (Signed)
Please put orders in EPIC as patient haas pre-op appointment on 09/05/2013 at 11 am! Thank you!

## 2013-08-29 ENCOUNTER — Encounter (HOSPITAL_COMMUNITY): Payer: Self-pay | Admitting: Pharmacy Technician

## 2013-08-29 ENCOUNTER — Other Ambulatory Visit: Payer: Self-pay | Admitting: Orthopedic Surgery

## 2013-09-05 ENCOUNTER — Ambulatory Visit (HOSPITAL_COMMUNITY): Admission: RE | Admit: 2013-09-05 | Payer: BC Managed Care – PPO | Source: Ambulatory Visit

## 2013-09-05 ENCOUNTER — Encounter (HOSPITAL_COMMUNITY)
Admission: RE | Admit: 2013-09-05 | Discharge: 2013-09-05 | Disposition: A | Discharge status: Home / Self Care | Payer: BC Managed Care – PPO | Source: Ambulatory Visit | Attending: Orthopedic Surgery | Admitting: Orthopedic Surgery

## 2013-09-05 ENCOUNTER — Encounter (HOSPITAL_COMMUNITY): Payer: Self-pay

## 2013-09-05 DIAGNOSIS — Z01812 Encounter for preprocedural laboratory examination: Secondary | ICD-10-CM | POA: Insufficient documentation

## 2013-09-05 HISTORY — DX: Anxiety disorder, unspecified: F41.9

## 2013-09-05 HISTORY — DX: Gastro-esophageal reflux disease without esophagitis: K21.9

## 2013-09-05 HISTORY — DX: Unspecified osteoarthritis, unspecified site: M19.90

## 2013-09-05 LAB — URINALYSIS, ROUTINE W REFLEX MICROSCOPIC
Bilirubin Urine: NEGATIVE
Glucose, UA: NEGATIVE mg/dL
Hgb urine dipstick: NEGATIVE
Ketones, ur: NEGATIVE mg/dL
NITRITE: NEGATIVE
Protein, ur: NEGATIVE mg/dL
SPECIFIC GRAVITY, URINE: 1.011 (ref 1.005–1.030)
Urobilinogen, UA: 0.2 mg/dL (ref 0.0–1.0)
pH: 5.5 (ref 5.0–8.0)

## 2013-09-05 LAB — COMPREHENSIVE METABOLIC PANEL
ALBUMIN: 3.6 g/dL (ref 3.5–5.2)
ALT: 99 U/L — ABNORMAL HIGH (ref 0–35)
AST: 97 U/L — AB (ref 0–37)
Alkaline Phosphatase: 158 U/L — ABNORMAL HIGH (ref 39–117)
Anion gap: 13 (ref 5–15)
BUN: 19 mg/dL (ref 6–23)
CALCIUM: 10.3 mg/dL (ref 8.4–10.5)
CO2: 26 meq/L (ref 19–32)
Chloride: 104 mEq/L (ref 96–112)
Creatinine, Ser: 1.05 mg/dL (ref 0.50–1.10)
GFR calc Af Amer: 66 mL/min — ABNORMAL LOW (ref >90)
GFR, EST NON AFRICAN AMERICAN: 57 mL/min — AB (ref >90)
Glucose, Bld: 72 mg/dL (ref 70–99)
Potassium: 4.3 mEq/L (ref 3.7–5.3)
SODIUM: 143 meq/L (ref 137–147)
TOTAL PROTEIN: 7.6 g/dL (ref 6.0–8.3)
Total Bilirubin: 0.7 mg/dL (ref 0.3–1.2)

## 2013-09-05 LAB — URINE MICROSCOPIC-ADD ON

## 2013-09-05 LAB — CBC
HCT: 42.2 % (ref 36.0–46.0)
Hemoglobin: 13.5 g/dL (ref 12.0–15.0)
MCH: 28.8 pg (ref 26.0–34.0)
MCHC: 32 g/dL (ref 30.0–36.0)
MCV: 90.2 fL (ref 78.0–100.0)
PLATELETS: 196 10*3/uL (ref 150–400)
RBC: 4.68 MIL/uL (ref 3.87–5.11)
RDW: 14.2 % (ref 11.5–15.5)
WBC: 4.8 10*3/uL (ref 4.0–10.5)

## 2013-09-05 LAB — SURGICAL PCR SCREEN
MRSA, PCR: NEGATIVE
STAPHYLOCOCCUS AUREUS: NEGATIVE

## 2013-09-05 LAB — PROTIME-INR
INR: 0.93 (ref 0.00–1.49)
Prothrombin Time: 12.5 seconds (ref 11.6–15.2)

## 2013-09-05 LAB — APTT: aPTT: 29 seconds (ref 24–37)

## 2013-09-05 NOTE — Patient Instructions (Addendum)
Leslie Robbins  09/05/2013                           YOUR PROCEDURE IS SCHEDULED ON: 09/16/13 AT 12:40 PM               [facility] AND                            FOLLOW  SIGNS TO SHORT STAY CENTER                 ARRIVE AT SHORT STAY AT: 9:40 AM               CALL THIS NUMBER IF ANY PROBLEMS THE DAY OF SURGERY :               [phone]                                REMEMBER:   Do not eat food or drink liquids AFTER MIDNIGHT   May have clear liquids UNTIL 6 HOURS BEFORE SURGERY (6:40 AM)               Take these medicines the morning of surgery with               A SIPS OF WATER :   LEVOTHYROXINE / OMEPRAZOLE / ESCITALOPRAM / MAY TAKE CLORAZEPATE IF NEED      Do not wear jewelry, make-up   Do not wear lotions, powders, or perfumes.   Do not shave legs or underarms 12 hrs. before surgery (men may shave face)  Do not bring valuables to the hospital.  Contacts, dentures or bridgework may not be worn into surgery.  Leave suitcase in the car. After surgery it may be brought to your room.  For patients admitted to the hospital more than one night, checkout time is            11:00 AM                                                      ________________________________________________________________________                               CLEAR LIQUID DIET   Foods Allowed                                                                     Foods Excluded  Coffee and tea, regular and decaf                             liquids that you cannot  Plain Jell-O in any flavor  see through such as: Fruit ices (not with fruit pulp)                                     milk, soups, orange juice  Iced Popsicles                                    All solid food Carbonated beverages, regular and diet                                    Cranberry, grape and apple juices Sports drinks like Gatorade Lightly  seasoned clear broth or consume(fat free) Sugar, honey syrup  _____________________________________________________________________                [place]  Before surgery, you can play an important role.  Because skin is not sterile, your skin needs to be as free of germs as possible.  You can reduce the number of germs on your skin by washing with CHG (chlorahexidine gluconate) soap before surgery.  CHG is an antiseptic cleaner which kills germs and bonds with the skin to continue killing germs even after washing. Please DO NOT use if you have an allergy to CHG or antibacterial soaps.  If your skin becomes reddened/irritated stop using the CHG and inform your nurse when you arrive at Short Stay. Do not shave (including legs and underarms) for at least 48 hours prior to the first CHG shower.  You may shave your face. Please follow these instructions carefully:   1.  Shower with CHG Soap the night before surgery and the  morning of Surgery.   2.  If you choose to wash your hair, wash your hair first as usual with your  normal  Shampoo.   3.  After you shampoo, rinse your hair and body thoroughly to remove the  shampoo.                                         4.  Use CHG as you would any other liquid soap.  You can apply chg directly  to the skin and wash . Gently wash with scrungie or clean wascloth    5.  Apply the CHG Soap to your body ONLY FROM THE NECK DOWN.   Do not use on open                           Wound or open sores. Avoid contact with eyes, ears mouth and genitals (private parts).                        Genitals (private parts) with your normal soap.              6.  Wash thoroughly, paying special attention to the area where your surgery  will be performed.   7.  Thoroughly rinse your body with warm water from the neck down.   8.  DO NOT shower/wash with your normal soap after using and rinsing off  the CHG Soap .  9.  Pat yourself  dry with a clean towel.             10.  Wear clean pajamas.             11.  Place clean sheets on your bed the night of your first shower and do not  sleep with pets.  Day of Surgery : Do not apply any lotions/deodorants the morning of surgery.  Please wear clean clothes to the hospital/surgery center.  FAILURE TO FOLLOW THESE INSTRUCTIONS MAY RESULT IN THE CANCELLATION OF YOUR SURGERY    PATIENT SIGNATURE_________________________________  ______________________________________________________________________     Adam [place]ix  An incentive spirometer is a tool that can help keep your lungs clear and active. This tool measures how well you are filling your lungs with each breath. Taking long deep breaths may help reverse or decrease the chance of developing breathing (pulmonary) problems (especially infection) following:  A long period of time when you are unable to move or be active. BEFORE THE PROCEDURE   If the spirometer includes an indicator to show your best effort, your nurse or respiratory therapist will set it to a desired goal.  If possible, sit up straight or lean slightly forward. Try not to slouch.  Hold the incentive spirometer in an upright position. INSTRUCTIONS FOR USE  1. Sit on the edge of your bed if possible, or sit up as far as you can in bed or on a chair. 2. Hold the incentive spirometer in an upright position. 3. Breathe out normally. 4. Place the mouthpiece in your mouth and seal your lips tightly around it. 5. Breathe in slowly and as deeply as possible, raising the piston or the ball toward the top of the column. 6. Hold your breath for 3-5 seconds or for as long as possible. Allow the piston or ball to fall to the bottom of the column. 7. Remove the mouthpiece from your mouth and breathe out normally. 8. Rest for a few seconds and repeat Steps 1 through 7 at least 10 times every 1-2 hours when you are awake. Take your time and take a few  normal breaths between deep breaths. 9. The spirometer may include an indicator to show your best effort. Use the indicator as a goal to work toward during each repetition. 10. After each set of 10 deep breaths, practice coughing to be sure your lungs are clear. If you have an incision (the cut made at the time of surgery), support your incision when coughing by placing a pillow or rolled up towels firmly against it. Once you are able to get out of bed, walk around indoors and cough well. You may stop using the incentive spirometer when instructed by your caregiver.  RISKS AND COMPLICATIONS  Take your time so you do not get dizzy or light-headed.  If you are in pain, you may need to take or ask for pain medication before doing incentive spirometry. It is harder to take a deep breath if you are having pain. AFTER USE  Rest and breathe slowly and easily.  It can be helpful to keep track of a log of your progress. Your caregiver can provide you with a simple table to help with this. If you are using the spirometer at home, follow these instructions: [place] IF:   You are having difficultly using the spirometer.  You have trouble using the spirometer as often as instructed.  Your pain medication is not giving enough relief while  using the spirometer.  You develop fever of 100.5 F (38.1 C) or higher. SEEK IMMEDIATE MEDICAL CARE IF:   You cough up bloody sputum that had not been present before.  You develop fever of 102 F (38.9 C) or greater.  You develop worsening pain at or near the incision site. MAKE SURE YOU:   Understand these instructions.  Will watch your condition.  Will get help right away if you are not doing well or get worse. Document Released: 07/04/2006 Document Revised: 05/16/2011 Document Reviewed: 09/04/2006 ExitCare Patient Information 2014 Exit[employer], [place].   ________________________________________________________________________  WHAT IS A  BLOOD TRANSFUSION? Blood Transfusion Information  A transfusion is the replacement of blood or some of its parts. Blood is made up of multiple cells which provide different functions.  Red blood cells carry oxygen and are used for blood loss replacement.  White blood cells fight against infection.  Platelets control bleeding.  Plasma helps clot blood.  Other blood products are available for specialized needs, such as hemophilia or other clotting disorders. BEFORE THE TRANSFUSION  Who gives blood for transfusions?   Healthy volunteers who are fully evaluated to make sure their blood is safe. This is blood bank blood. Transfusion therapy is the safest it has ever been in the practice of medicine. Before blood is taken from a donor, a complete history is taken to make sure that person has no history of diseases nor engages in risky social behavior (examples are intravenous drug use or sexual activity with multiple partners). The donor's travel history is screened to minimize risk of transmitting infections, such as malaria. The donated blood is tested for signs of infectious diseases, such as HIV and hepatitis. The blood is then tested to be sure it is compatible with you in order to minimize the chance of a transfusion reaction. If you or a relative donates blood, this is often done in anticipation of surgery and is not appropriate for emergency situations. It takes many days to process the donated blood. RISKS AND COMPLICATIONS Although transfusion therapy is very safe and saves many lives, the main dangers of transfusion include:   Getting an infectious disease.  Developing a transfusion reaction. This is an allergic reaction to something in the blood you were given. Every precaution is taken to prevent this. The decision to have a blood transfusion has been considered carefully by your caregiver before blood is given. Blood is not given unless the benefits outweigh the risks. AFTER THE  TRANSFUSION  Right after receiving a blood transfusion, you will usually feel much better and more energetic. This is especially true if your red blood cells have gotten low (anemic). The transfusion raises the level of the red blood cells which carry oxygen, and this usually causes an energy increase.  The nurse administering the transfusion will monitor you carefully for complications. HOME CARE INSTRUCTIONS  No special instructions are needed after a transfusion. You may find your energy is better. Speak with your caregiver about any limitations on activity for underlying diseases you may have. SEEK MEDICAL CARE IF:   Your condition is not improving after your transfusion.  You develop redness or irritation at the intravenous (IV) site. SEEK IMMEDIATE MEDICAL CARE IF:  Any of the following symptoms occur over the next 12 hours:  Shaking chills.  You have a temperature by mouth above 102 F (38.9 C), not controlled by medicine.  Chest, back, or muscle pain.  People around you feel you are not acting correctly or  are confused.  Shortness of breath or difficulty breathing.  Dizziness and fainting.  You get a rash or develop hives.  You have a decrease in urine output.  Your urine turns a dark color or changes to pink, red, or brown. Any of the following symptoms occur over the next 10 days:  You have a temperature by mouth above 102 F (38.9 C), not controlled by medicine.  Shortness of breath.  Weakness after normal activity.  The white part of the eye turns yellow (jaundice).  You have a decrease in the amount of urine or are urinating less often.  Your urine turns a dark color or changes to pink, red, or brown. Document Released: 02/19/2000 Document Revised: 05/16/2011 Document Reviewed: 10/08/2007 [facility] Patient Information 2014 [place], [place].  _______________________________________________________________________

## 2013-09-11 NOTE — Progress Notes (Signed)
Did you really want a left hip xray on this pt since she is having a total left knee? I released the order then realized the order may not be correct but I can put it back in if you need the hip xray.

## 2013-09-15 ENCOUNTER — Other Ambulatory Visit: Payer: Self-pay | Admitting: Orthopedic Surgery

## 2013-09-15 NOTE — H&P (Signed)
Leslie Robbins w. Kittelson DOB: 1955-12-28 Married / Language: English / Race: White Female  Date of Admission:  09-16-2013 Chief Complaint:  Left Knee Pain History of Present Illness  The patient is a 58 year old female who comes in  for a preoperative history and physical. The patient is scheduled for a left total knee arthroplasty to be performed by Dr. Dione Plover. Aluisio, MD at [facility] on 09-16-2013. The patient is a 58 year old female who presents for follow up of their knee. The patient is being followed for their left knee pain and osteoarthritis. They are now month(s) out from Synvisc series. Symptoms reported today include: pain, pain in calf, locking, giving way, instability and difficulty ambulating. The patient feels that they are doing poorly and report their pain level to be moderate to severe. Current treatment includes: NSAIDs (meloxicam). The following medication has been used for pain control: Tylenol. The patient has reported improvement of their symptoms with: Cortisone injections (short term) and viscosupplementation (she reports her knees did not lock or catch as much after the visco, but the pain never really went away). The knee has been giving her more difficulty since her Synvisc has worn off now six months out. She would like to proceed with the total knee replacement at this time. They have been treated conservatively in the past for the above stated problem and despite conservative measures, they continue to have progressive pain and severe functional limitations and dysfunction. They have failed non-operative management including home exercise, medications, and injections. It is felt that they would benefit from undergoing total joint replacement. Risks and benefits of the procedure have been discussed with the patient and they elect to proceed with surgery. There are no active contraindications to surgery such as ongoing infection or rapidly progressive neurological  disease.  Allergies  Sulfathiazole *Sulfonamides** Itching, Rash.  Problem List/Past Medical Ok Edwards, III PA-C; 09/11/2013 2:00 PM) Primary osteoarthritis of left knee (715.16  M17.12) Osteoarthritis, Knee (715.96) (715.96  M17.9) Knee Pain (719.46  M25.569) Bursitis, hip (726.5  M70.70) Anxiety Disorder Depression Impaired Vision Hypertension Hypercholesterolemia Varicose veins Hiatal Hernia Gastroesophageal Reflux Disease Hemorrhoids Hepatitis Childhood with Jaundice Diverticulosis Hypothyroidism Measles   Family History Ok Edwards, III PA-C; Sep 11, 2013 2:06 PM) Father Deceased. age 30, Alcoholism, Suicide Mother Living. Lupus, Dementia  Social History Ok Edwards, III PA-C; September 11, 2013 2:03 PM) Tobacco use Former smoker. many years ago, but smoked very infrequently when she did Alcohol use Never consumed alcohol. Illicit drug use NONE Children 3 Living situation Lives with spouse. 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 Benazepril-Hydrochlorothiazide (20-25MG  Tablet, Oral) Active. (1 QD) Escitalopram Oxalate (20MG  Tablet, Oral) Active. (1 QD) Levothyroxine Sodium (137MCG Tablet, Oral) Active. (1 QD) Meloxicam (15MG  Tablet, Oral) Active. (QD) Clorazepate Dipotassium (3.75MG  Tablet, Oral) Active. (PRN) Atorvastatin Calcium (40MG  Tablet, Oral) Active. (1 QD)  Past Surgical History Ok Edwards, III PA-C; 11-Sep-2013 2:01 PM) Breast Biopsy Date: 1983. left Hysterectomy Date: 38. complete (non-cancerous) Carpal Tunnel Repair Date: 1982. right Tubal Ligation Date: 65. D & C Procedure 1976, 1993   Review of Systems (Alezandrew L. Roshelle Traub III PA-C; 09-11-13 1:56 PM) General Not Present- Chills,  Fatigue, Fever, Memory Loss, Night Sweats, Weight Gain and Weight Loss. Skin Not Present- Eczema, Hives, Itching, Lesions and Rash. HEENT Not Present- Dentures, Double Vision, Headache, Hearing Loss,  Tinnitus and Visual Loss. Respiratory Not Present- Allergies, Chronic Cough, Coughing up blood, Shortness of breath at rest and Shortness of breath with exertion. Cardiovascular Not Present- Chest Pain, Difficulty Breathing Lying Down, Murmur, Palpitations, Racing/skipping heartbeats and Swelling. Gastrointestinal Present- Nausea. Not Present- Abdominal Pain, Bloody Stool, Constipation, Diarrhea, Difficulty Swallowing, Heartburn, Jaundice, Loss of appetitie and Vomiting. Female Genitourinary Not Present- Blood in Urine, Discharge, Flank Pain, Incontinence, Painful Urination, Urgency, Urinary frequency, Urinary Retention, Urinating at Night and Weak urinary stream. Musculoskeletal Present- Back Pain, Joint Pain, Joint Swelling and Morning Stiffness. Not Present- Muscle Pain, Muscle Weakness and Spasms. Neurological Present- Headaches. Not Present- Blackout spells, Difficulty with balance, Dizziness, Paralysis, Tremor and Weakness. Psychiatric Not Present- Insomnia.   Vitals  Weight: 195 lb Height: 64in Weight was reported by patient. Height was reported by patient. Body Surface Area: 2 m Body Mass Index: 33.47 kg/m Pulse: 64 (Regular)  BP: 102/70 (Sitting, Left Arm, Standard)    Physical Exam (Alezandrew L. [place] Gadbois III PA-C; 09/05/2013 2:11 PM) General Mental Status -Alert, cooperative and good historian. General Appearance-pleasant, Not in acute distress. Orientation-Oriented X3. Build & Nutrition-Well nourished and Well developed.  Head and Neck Head-normocephalic, atraumatic . Neck Global Assessment - supple, no bruit auscultated on the right, no bruit auscultated on the left.  Eye Vision-Wears corrective lenses. Pupil - Bilateral-Regular and Round. Motion -  Bilateral-EOMI.  Chest and Lung Exam Auscultation Breath sounds - clear at anterior chest wall and clear at posterior chest wall. Adventitious sounds - No Adventitious sounds.  Cardiovascular Auscultation Rhythm - Regular rate and rhythm. Heart Sounds - S1 WNL and S2 WNL. Murmurs & Other Heart Sounds - Auscultation of the heart reveals - No Murmurs.  Abdomen Palpation/Percussion Tenderness - Abdomen is non-tender to palpation. Rigidity (guarding) - Abdomen is soft. Auscultation Auscultation of the abdomen reveals - Bowel sounds normal.  Female Genitourinary Note: Not done, not pertinent to present illness   Musculoskeletal Note: Today her knee shows slight varus deformity. She has crepitation throughout the range of motion. Tenderness in the medial joint line.  RADIOGRAPHS: x rays are obtained today and do show advanced patellofemoral and medial compartment arthritis, and moderately severe lateral compartment arthritis.    Assessment & Plan (Alezandrew L. Robbert Langlinais III PA-C; 09/05/2013 1:54 PM) Primary osteoarthritis of left knee (715.16  M17.12) Note:Plan is for a Left Total Knee Replacement by Dr. Wynelle Link.  Plan is to go home.  PCP - [facility], Ronnald Collum FNP  The patient does not have any contraindications and will receive TXA (tranexamic acid) prior to surgery.  Signed electronically by Ok Edwards, III PA-C

## 2013-09-16 ENCOUNTER — Encounter (HOSPITAL_COMMUNITY)
Admission: RE | Disposition: A | Discharge status: Home Health | Payer: Self-pay | Source: Ambulatory Visit | Attending: Orthopedic Surgery

## 2013-09-16 ENCOUNTER — Encounter (HOSPITAL_COMMUNITY): Payer: BC Managed Care – PPO | Admitting: Anesthesiology

## 2013-09-16 ENCOUNTER — Encounter (HOSPITAL_COMMUNITY): Payer: Self-pay | Admitting: *Deleted

## 2013-09-16 ENCOUNTER — Inpatient Hospital Stay (HOSPITAL_COMMUNITY): Payer: BC Managed Care – PPO | Admitting: Anesthesiology

## 2013-09-16 ENCOUNTER — Inpatient Hospital Stay (HOSPITAL_COMMUNITY)
Admission: RE | Admit: 2013-09-16 | Discharge: 2013-09-18 | DRG: 470 | Disposition: A | Discharge status: Home Health | Payer: BC Managed Care – PPO | Source: Ambulatory Visit | Attending: Orthopedic Surgery | Admitting: Orthopedic Surgery

## 2013-09-16 DIAGNOSIS — M1712 Unilateral primary osteoarthritis, left knee: Secondary | ICD-10-CM

## 2013-09-16 DIAGNOSIS — I10 Essential (primary) hypertension: Secondary | ICD-10-CM | POA: Diagnosis present

## 2013-09-16 DIAGNOSIS — F411 Generalized anxiety disorder: Secondary | ICD-10-CM

## 2013-09-16 DIAGNOSIS — F418 Other specified anxiety disorders: Secondary | ICD-10-CM

## 2013-09-16 DIAGNOSIS — M1710 Unilateral primary osteoarthritis, unspecified knee: Principal | ICD-10-CM | POA: Diagnosis present

## 2013-09-16 DIAGNOSIS — M179 Osteoarthritis of knee, unspecified: Secondary | ICD-10-CM | POA: Diagnosis present

## 2013-09-16 DIAGNOSIS — Z96652 Presence of left artificial knee joint: Secondary | ICD-10-CM

## 2013-09-16 DIAGNOSIS — Z01812 Encounter for preprocedural laboratory examination: Secondary | ICD-10-CM

## 2013-09-16 DIAGNOSIS — E038 Other specified hypothyroidism: Secondary | ICD-10-CM

## 2013-09-16 DIAGNOSIS — Z6834 Body mass index (BMI) 34.0-34.9, adult: Secondary | ICD-10-CM

## 2013-09-16 DIAGNOSIS — K219 Gastro-esophageal reflux disease without esophagitis: Secondary | ICD-10-CM | POA: Diagnosis present

## 2013-09-16 DIAGNOSIS — E039 Hypothyroidism, unspecified: Secondary | ICD-10-CM | POA: Diagnosis present

## 2013-09-16 DIAGNOSIS — E7800 Pure hypercholesterolemia, unspecified: Secondary | ICD-10-CM | POA: Diagnosis present

## 2013-09-16 HISTORY — PX: TOTAL KNEE ARTHROPLASTY: SHX125

## 2013-09-16 LAB — ABO/RH: ABO/RH(D): B POS

## 2013-09-16 LAB — TYPE AND SCREEN
ABO/RH(D): B POS
ANTIBODY SCREEN: NEGATIVE

## 2013-09-16 SURGERY — ARTHROPLASTY, KNEE, TOTAL
Anesthesia: Spinal | Site: Knee | Laterality: Left

## 2013-09-16 MED ORDER — DEXAMETHASONE SODIUM PHOSPHATE 10 MG/ML IJ SOLN
10.0000 mg | Freq: Every day | INTRAMUSCULAR | Status: AC
  Filled 2013-09-16: qty 1

## 2013-09-16 MED ORDER — TRANEXAMIC ACID 100 MG/ML IV SOLN
1000.0000 mg | INTRAVENOUS | Status: AC
  Administered 2013-09-16: 1000 mg via INTRAVENOUS
  Filled 2013-09-16: qty 10

## 2013-09-16 MED ORDER — METHOCARBAMOL 1000 MG/10ML IJ SOLN
500.0000 mg | Freq: Four times a day (QID) | INTRAMUSCULAR | Status: DC | PRN
  Administered 2013-09-16: 500 mg via INTRAVENOUS
  Filled 2013-09-16: qty 5

## 2013-09-16 MED ORDER — SODIUM CHLORIDE 0.9 % IR SOLN
Status: DC | PRN
  Administered 2013-09-16: 1000 mL

## 2013-09-16 MED ORDER — PROPOFOL INFUSION 10 MG/ML OPTIME
INTRAVENOUS | Status: DC | PRN
  Administered 2013-09-16: 75 ug/kg/min via INTRAVENOUS

## 2013-09-16 MED ORDER — PROPOFOL 10 MG/ML IV BOLUS
INTRAVENOUS | Status: AC
  Filled 2013-09-16: qty 20

## 2013-09-16 MED ORDER — CEFAZOLIN SODIUM-DEXTROSE 2-3 GM-% IV SOLR
INTRAVENOUS | Status: AC
  Filled 2013-09-16: qty 50

## 2013-09-16 MED ORDER — ONDANSETRON HCL 4 MG/2ML IJ SOLN
INTRAMUSCULAR | Status: AC
  Filled 2013-09-16: qty 2

## 2013-09-16 MED ORDER — HYDROMORPHONE HCL PF 1 MG/ML IJ SOLN
INTRAMUSCULAR | Status: AC
  Filled 2013-09-16: qty 1

## 2013-09-16 MED ORDER — 0.9 % SODIUM CHLORIDE (POUR BTL) OPTIME
TOPICAL | Status: DC | PRN
  Administered 2013-09-16: 1000 mL

## 2013-09-16 MED ORDER — BUPIVACAINE IN DEXTROSE 0.75-8.25 % IT SOLN
INTRATHECAL | Status: DC | PRN
  Administered 2013-09-16: 1.6 mL via INTRATHECAL

## 2013-09-16 MED ORDER — CLORAZEPATE DIPOTASSIUM 7.5 MG PO TABS
3.7500 mg | ORAL_TABLET | Freq: Two times a day (BID) | ORAL | Status: DC
  Administered 2013-09-16 – 2013-09-18 (×4): 3.75 mg via ORAL
  Filled 2013-09-16 (×4): qty 1

## 2013-09-16 MED ORDER — LIDOCAINE HCL (CARDIAC) 20 MG/ML IV SOLN
INTRAVENOUS | Status: DC | PRN
  Administered 2013-09-16: 50 mg via INTRAVENOUS

## 2013-09-16 MED ORDER — SODIUM CHLORIDE 0.9 % IJ SOLN
INTRAMUSCULAR | Status: DC | PRN
  Administered 2013-09-16: 30 mL

## 2013-09-16 MED ORDER — PHENOL 1.4 % MT LIQD
1.0000 | OROMUCOSAL | Status: DC | PRN
  Filled 2013-09-16: qty 177

## 2013-09-16 MED ORDER — MIDAZOLAM HCL 2 MG/2ML IJ SOLN
INTRAMUSCULAR | Status: AC
  Filled 2013-09-16: qty 2

## 2013-09-16 MED ORDER — ONDANSETRON HCL 4 MG/2ML IJ SOLN
INTRAMUSCULAR | Status: DC | PRN
  Administered 2013-09-16: 4 mg via INTRAVENOUS

## 2013-09-16 MED ORDER — METOCLOPRAMIDE HCL 5 MG/ML IJ SOLN: 5.0000 mg | Freq: Three times a day (TID) | INTRAMUSCULAR | Status: DC | PRN

## 2013-09-16 MED ORDER — FENTANYL CITRATE 0.05 MG/ML IJ SOLN
INTRAMUSCULAR | Status: DC | PRN
  Administered 2013-09-16: 100 ug via INTRAVENOUS

## 2013-09-16 MED ORDER — METHOCARBAMOL 500 MG PO TABS
500.0000 mg | ORAL_TABLET | Freq: Four times a day (QID) | ORAL | Status: DC | PRN
  Administered 2013-09-16 – 2013-09-18 (×5): 500 mg via ORAL
  Filled 2013-09-16 (×5): qty 1

## 2013-09-16 MED ORDER — FLEET ENEMA 7-19 GM/118ML RE ENEM: 1.0000 | ENEMA | Freq: Once | RECTAL | Status: AC | PRN

## 2013-09-16 MED ORDER — LACTATED RINGERS IV SOLN: INTRAVENOUS | Status: DC

## 2013-09-16 MED ORDER — BUPIVACAINE HCL 0.25 % IJ SOLN
INTRAMUSCULAR | Status: DC | PRN
  Administered 2013-09-16: 20 mL

## 2013-09-16 MED ORDER — LACTATED RINGERS IV SOLN
INTRAVENOUS | Status: DC
  Administered 2013-09-16: 14:00:00 via INTRAVENOUS
  Administered 2013-09-16: 1000 mL via INTRAVENOUS

## 2013-09-16 MED ORDER — SODIUM CHLORIDE 0.9 % IV SOLN: INTRAVENOUS | Status: DC

## 2013-09-16 MED ORDER — MENTHOL 3 MG MT LOZG
1.0000 | LOZENGE | OROMUCOSAL | Status: DC | PRN
  Filled 2013-09-16: qty 9

## 2013-09-16 MED ORDER — DEXAMETHASONE SODIUM PHOSPHATE 10 MG/ML IJ SOLN
10.0000 mg | Freq: Once | INTRAMUSCULAR | Status: AC
  Administered 2013-09-16: 10 mg via INTRAVENOUS

## 2013-09-16 MED ORDER — CEFAZOLIN SODIUM-DEXTROSE 2-3 GM-% IV SOLR
2.0000 g | INTRAVENOUS | Status: AC
  Administered 2013-09-16: 2 g via INTRAVENOUS

## 2013-09-16 MED ORDER — LEVOTHYROXINE SODIUM 137 MCG PO TABS
137.0000 ug | ORAL_TABLET | Freq: Every day | ORAL | Status: DC
  Administered 2013-09-17 – 2013-09-18 (×2): 137 ug via ORAL
  Filled 2013-09-16 (×3): qty 1

## 2013-09-16 MED ORDER — KETOROLAC TROMETHAMINE 15 MG/ML IJ SOLN
7.5000 mg | Freq: Four times a day (QID) | INTRAMUSCULAR | Status: DC | PRN
  Administered 2013-09-16: 7.5 mg via INTRAVENOUS
  Filled 2013-09-16: qty 1

## 2013-09-16 MED ORDER — PANTOPRAZOLE SODIUM 40 MG PO TBEC
80.0000 mg | DELAYED_RELEASE_TABLET | Freq: Every day | ORAL | Status: DC
  Administered 2013-09-16: 80 mg via ORAL
  Filled 2013-09-16 (×2): qty 2

## 2013-09-16 MED ORDER — BISACODYL 10 MG RE SUPP: 10.0000 mg | Freq: Every day | RECTAL | Status: DC | PRN

## 2013-09-16 MED ORDER — BUPIVACAINE HCL (PF) 0.25 % IJ SOLN
INTRAMUSCULAR | Status: AC
  Filled 2013-09-16: qty 30

## 2013-09-16 MED ORDER — ESCITALOPRAM OXALATE 20 MG PO TABS
20.0000 mg | ORAL_TABLET | Freq: Every morning | ORAL | Status: DC
  Administered 2013-09-17 – 2013-09-18 (×2): 20 mg via ORAL
  Filled 2013-09-16 (×2): qty 1

## 2013-09-16 MED ORDER — DIPHENHYDRAMINE HCL 12.5 MG/5ML PO ELIX: 12.5000 mg | ORAL_SOLUTION | ORAL | Status: DC | PRN

## 2013-09-16 MED ORDER — ATORVASTATIN CALCIUM 40 MG PO TABS
40.0000 mg | ORAL_TABLET | Freq: Every day | ORAL | Status: DC
  Administered 2013-09-16 – 2013-09-17 (×2): 40 mg via ORAL
  Filled 2013-09-16 (×3): qty 1

## 2013-09-16 MED ORDER — DEXAMETHASONE 6 MG PO TABS
10.0000 mg | ORAL_TABLET | Freq: Every day | ORAL | Status: AC
  Administered 2013-09-17: 10 mg via ORAL
  Filled 2013-09-16: qty 1

## 2013-09-16 MED ORDER — DOCUSATE SODIUM 100 MG PO CAPS
100.0000 mg | ORAL_CAPSULE | Freq: Two times a day (BID) | ORAL | Status: DC
  Administered 2013-09-16 – 2013-09-18 (×4): 100 mg via ORAL

## 2013-09-16 MED ORDER — OXYCODONE HCL 5 MG PO TABS
5.0000 mg | ORAL_TABLET | ORAL | Status: DC | PRN
  Administered 2013-09-16 (×2): 10 mg via ORAL
  Administered 2013-09-17: 5 mg via ORAL
  Administered 2013-09-17 – 2013-09-18 (×8): 10 mg via ORAL
  Filled 2013-09-16 (×11): qty 2

## 2013-09-16 MED ORDER — METOCLOPRAMIDE HCL 10 MG PO TABS: 5.0000 mg | ORAL_TABLET | Freq: Three times a day (TID) | ORAL | Status: DC | PRN

## 2013-09-16 MED ORDER — HYDROMORPHONE HCL PF 1 MG/ML IJ SOLN
0.2500 mg | INTRAMUSCULAR | Status: DC | PRN
  Administered 2013-09-16 (×3): 0.5 mg via INTRAVENOUS

## 2013-09-16 MED ORDER — BUPIVACAINE LIPOSOME 1.3 % IJ SUSP: 20.0000 mL | Freq: Once | INTRAMUSCULAR | Status: DC

## 2013-09-16 MED ORDER — CHLORHEXIDINE GLUCONATE 4 % EX LIQD: 60.0000 mL | Freq: Once | CUTANEOUS | Status: DC

## 2013-09-16 MED ORDER — MORPHINE SULFATE 2 MG/ML IJ SOLN: 1.0000 mg | INTRAMUSCULAR | Status: DC | PRN

## 2013-09-16 MED ORDER — ACETAMINOPHEN 10 MG/ML IV SOLN
1000.0000 mg | Freq: Once | INTRAVENOUS | Status: AC
  Administered 2013-09-16: 1000 mg via INTRAVENOUS
  Filled 2013-09-16: qty 100

## 2013-09-16 MED ORDER — BENAZEPRIL-HYDROCHLOROTHIAZIDE 20-25 MG PO TABS: 1.0000 | ORAL_TABLET | Freq: Every morning | ORAL | Status: DC

## 2013-09-16 MED ORDER — ONDANSETRON HCL 4 MG PO TABS: 4.0000 mg | ORAL_TABLET | Freq: Four times a day (QID) | ORAL | Status: DC | PRN

## 2013-09-16 MED ORDER — BENAZEPRIL HCL 20 MG PO TABS
20.0000 mg | ORAL_TABLET | Freq: Every day | ORAL | Status: DC
  Administered 2013-09-17 – 2013-09-18 (×2): 20 mg via ORAL
  Filled 2013-09-16 (×2): qty 1

## 2013-09-16 MED ORDER — ONDANSETRON HCL 4 MG/2ML IJ SOLN: 4.0000 mg | Freq: Four times a day (QID) | INTRAMUSCULAR | Status: DC | PRN

## 2013-09-16 MED ORDER — MIDAZOLAM HCL 5 MG/5ML IJ SOLN
INTRAMUSCULAR | Status: DC | PRN
  Administered 2013-09-16: 2 mg via INTRAVENOUS

## 2013-09-16 MED ORDER — RIVAROXABAN 10 MG PO TABS
10.0000 mg | ORAL_TABLET | Freq: Every day | ORAL | Status: DC
  Administered 2013-09-17 – 2013-09-18 (×2): 10 mg via ORAL
  Filled 2013-09-16 (×3): qty 1

## 2013-09-16 MED ORDER — BUPIVACAINE LIPOSOME 1.3 % IJ SUSP
20.0000 mL | Freq: Once | INTRAMUSCULAR | Status: DC
  Filled 2013-09-16: qty 20

## 2013-09-16 MED ORDER — DEXAMETHASONE SODIUM PHOSPHATE 10 MG/ML IJ SOLN
INTRAMUSCULAR | Status: AC
  Filled 2013-09-16: qty 1

## 2013-09-16 MED ORDER — BUPIVACAINE LIPOSOME 1.3 % IJ SUSP
INTRAMUSCULAR | Status: DC | PRN
  Administered 2013-09-16: 20 mL

## 2013-09-16 MED ORDER — CEFAZOLIN SODIUM-DEXTROSE 2-3 GM-% IV SOLR
2.0000 g | Freq: Four times a day (QID) | INTRAVENOUS | Status: AC
  Administered 2013-09-16 – 2013-09-17 (×2): 2 g via INTRAVENOUS
  Filled 2013-09-16 (×3): qty 50

## 2013-09-16 MED ORDER — POLYETHYLENE GLYCOL 3350 17 G PO PACK: 17.0000 g | PACK | Freq: Every day | ORAL | Status: DC | PRN

## 2013-09-16 MED ORDER — SODIUM CHLORIDE 0.9 % IJ SOLN
INTRAMUSCULAR | Status: AC
  Filled 2013-09-16: qty 50

## 2013-09-16 MED ORDER — LIDOCAINE HCL (CARDIAC) 20 MG/ML IV SOLN
INTRAVENOUS | Status: AC
  Filled 2013-09-16: qty 5

## 2013-09-16 MED ORDER — FENTANYL CITRATE 0.05 MG/ML IJ SOLN
INTRAMUSCULAR | Status: AC
  Filled 2013-09-16: qty 2

## 2013-09-16 MED ORDER — HYDROCHLOROTHIAZIDE 25 MG PO TABS
25.0000 mg | ORAL_TABLET | Freq: Every day | ORAL | Status: DC
  Administered 2013-09-17 – 2013-09-18 (×2): 25 mg via ORAL
  Filled 2013-09-16 (×2): qty 1

## 2013-09-16 MED ORDER — DEXTROSE-NACL 5-0.45 % IV SOLN
INTRAVENOUS | Status: DC
  Administered 2013-09-16 – 2013-09-17 (×2): via INTRAVENOUS

## 2013-09-16 SURGICAL SUPPLY — 65 items
BAG SPEC THK2 15X12 ZIP CLS (MISCELLANEOUS) ×1
BAG ZIPLOCK 12X15 (MISCELLANEOUS) ×2 IMPLANT
BANDAGE ELASTIC 6 VELCRO ST LF (GAUZE/BANDAGES/DRESSINGS) ×2 IMPLANT
BANDAGE ESMARK 6X9 LF (GAUZE/BANDAGES/DRESSINGS) ×1 IMPLANT
BLADE SAG 18X100X1.27 (BLADE) ×2 IMPLANT
BLADE SAW SGTL 11.0X1.19X90.0M (BLADE) ×2 IMPLANT
BNDG CMPR 9X6 STRL LF SNTH (GAUZE/BANDAGES/DRESSINGS) ×1
BNDG ESMARK 6X9 LF (GAUZE/BANDAGES/DRESSINGS) ×2
BOWL SMART MIX CTS (DISPOSABLE) ×2 IMPLANT
CAP KNEE ATTUNE RP ×1 IMPLANT
CEMENT HV SMART SET (Cement) ×4 IMPLANT
CUFF TOURN SGL QUICK 34 (TOURNIQUET CUFF) ×2
CUFF TRNQT CYL 34X4X40X1 (TOURNIQUET CUFF) ×1 IMPLANT
DECANTER SPIKE VIAL GLASS SM (MISCELLANEOUS) ×2 IMPLANT
DRAPE EXTREMITY T 121X128X90 (DRAPE) ×2 IMPLANT
DRAPE POUCH INSTRU U-SHP 10X18 (DRAPES) ×2 IMPLANT
DRAPE U-SHAPE 47X51 STRL (DRAPES) ×2 IMPLANT
DRSG ADAPTIC 3X8 NADH LF (GAUZE/BANDAGES/DRESSINGS) ×2 IMPLANT
DRSG PAD ABDOMINAL 8X10 ST (GAUZE/BANDAGES/DRESSINGS) ×2 IMPLANT
DURAPREP 26ML APPLICATOR (WOUND CARE) ×2 IMPLANT
ELECT REM PT RETURN 9FT ADLT (ELECTROSURGICAL) ×2
ELECTRODE REM PT RTRN 9FT ADLT (ELECTROSURGICAL) ×1 IMPLANT
EVACUATOR 1/8 PVC DRAIN (DRAIN) ×2 IMPLANT
FACESHIELD WRAPAROUND (MASK) ×10 IMPLANT
FACESHIELD WRAPAROUND OR TEAM (MASK) ×5 IMPLANT
GAUZE SPONGE 4X4 12PLY STRL (GAUZE/BANDAGES/DRESSINGS) ×2 IMPLANT
GLOVE BIO SURGEON STRL SZ7.5 (GLOVE) ×1 IMPLANT
GLOVE BIO SURGEON STRL SZ8 (GLOVE) ×2 IMPLANT
GLOVE BIOGEL PI IND STRL 6.5 (GLOVE) ×2 IMPLANT
GLOVE BIOGEL PI IND STRL 7.5 (GLOVE) IMPLANT
GLOVE BIOGEL PI IND STRL 8 (GLOVE) ×1 IMPLANT
GLOVE BIOGEL PI INDICATOR 6.5 (GLOVE) ×2
GLOVE BIOGEL PI INDICATOR 7.5 (GLOVE) ×1
GLOVE BIOGEL PI INDICATOR 8 (GLOVE) ×2
GLOVE SURG SS PI 6.5 STRL IVOR (GLOVE) ×1 IMPLANT
GLOVE SURG SS PI 7.5 STRL IVOR (GLOVE) ×2 IMPLANT
GOWN STRL REIN 2XL XLG LVL4 (GOWN DISPOSABLE) ×1 IMPLANT
GOWN STRL REUS W/TWL LRG LVL3 (GOWN DISPOSABLE) ×2 IMPLANT
GOWN STRL REUS W/TWL XL LVL3 (GOWN DISPOSABLE) ×2 IMPLANT
HANDPIECE INTERPULSE COAX TIP (DISPOSABLE) ×2
IMMOBILIZER KNEE 20 (SOFTGOODS) ×2
IMMOBILIZER KNEE 20 THIGH 36 (SOFTGOODS) ×1 IMPLANT
KIT BASIN OR (CUSTOM PROCEDURE TRAY) ×2 IMPLANT
MANIFOLD NEPTUNE II (INSTRUMENTS) ×2 IMPLANT
NDL SAFETY ECLIPSE 18X1.5 (NEEDLE) ×2 IMPLANT
NEEDLE HYPO 18GX1.5 SHARP (NEEDLE) ×4
NS IRRIG 1000ML POUR BTL (IV SOLUTION) ×2 IMPLANT
PACK TOTAL JOINT (CUSTOM PROCEDURE TRAY) ×2 IMPLANT
PADDING CAST COTTON 6X4 STRL (CAST SUPPLIES) ×3 IMPLANT
POSITIONER SURGICAL ARM (MISCELLANEOUS) ×2 IMPLANT
SET HNDPC FAN SPRY TIP SCT (DISPOSABLE) ×1 IMPLANT
SPONGE GAUZE 4X4 12PLY (GAUZE/BANDAGES/DRESSINGS) ×1 IMPLANT
STRIP CLOSURE SKIN 1/2X4 (GAUZE/BANDAGES/DRESSINGS) ×3 IMPLANT
SUCTION FRAZIER 12FR DISP (SUCTIONS) ×2 IMPLANT
SUT MNCRL AB 4-0 PS2 18 (SUTURE) ×2 IMPLANT
SUT VIC AB 2-0 CT1 27 (SUTURE) ×6
SUT VIC AB 2-0 CT1 TAPERPNT 27 (SUTURE) ×3 IMPLANT
SUT VLOC 180 0 24IN GS25 (SUTURE) ×2 IMPLANT
SYRINGE 20CC LL (MISCELLANEOUS) ×2 IMPLANT
SYRINGE 60CC LL (MISCELLANEOUS) ×2 IMPLANT
TOWEL OR 17X26 10 PK STRL BLUE (TOWEL DISPOSABLE) ×2 IMPLANT
TOWEL OR NON WOVEN STRL DISP B (DISPOSABLE) ×2 IMPLANT
TRAY FOLEY CATH 14FRSI W/METER (CATHETERS) ×2 IMPLANT
WATER STERILE IRR 1500ML POUR (IV SOLUTION) ×2 IMPLANT
WRAP KNEE MAXI GEL POST OP (GAUZE/BANDAGES/DRESSINGS) ×2 IMPLANT

## 2013-09-16 NOTE — Op Note (Signed)
Pre-operative diagnosis- Osteoarthritis  Left knee(s)  Post-operative diagnosis- Osteoarthritis Left knee(s)  Procedure-  Left  Total Knee Arthroplasty (Attune System)  Surgeon- Dione Plover. Mahki Spikes, MD  Assistant- Arlee Muslim, PA-C   Anesthesia-  Spinal  EBL-* No blood loss amount entered *   Drains Hemovac  Tourniquet time-  Total Tourniquet Time Documented: Thigh (Left) - 39 minutes Total: Thigh (Left) - 39 minutes     Complications- None  Condition-PACU - hemodynamically stable.   Brief Clinical Note  Leslie Robbins is a 58 y.o. year old female with end stage OA of her left knee with progressively worsening pain and dysfunction. She has constant pain, with activity and at rest and significant functional deficits with difficulties even with ADLs. She has had extensive non-op management including analgesics, injections of cortisone and viscosupplements, and home exercise program, but remains in significant pain with significant dysfunction. Radiographs show bone on bone arthritis medial and patellofemoral. She presents now for left Total Knee Arthroplasty.    Procedure in detail---   The patient is brought into the operating room and positioned supine on the operating table. After successful administration of  Spinal,   a tourniquet is placed high on the  Left thigh(s) and the lower extremity is prepped and draped in the usual sterile fashion. Time out is performed by the operating team and then the  Left lower extremity is wrapped in Esmarch, knee flexed and the tourniquet inflated to 300 mmHg.       A midline incision is made with a ten blade through the subcutaneous tissue to the level of the extensor mechanism. A fresh blade is used to make a medial parapatellar arthrotomy. Soft tissue over the proximal medial tibia is subperiosteally elevated to the joint line with a knife and into the semimembranosus bursa with a Cobb elevator. Soft tissue over the proximal lateral tibia is elevated  with attention being paid to avoiding the patellar tendon on the tibial tubercle. The patella is everted, knee flexed 90 degrees and the ACL and PCL are removed. Findings are bone on bone medial and patellofemoral with massive global osteophytes.        The drill is used to create a starting hole in the distal femur and the canal is thoroughly irrigated with sterile saline to remove the fatty contents. The 5 degree Left  valgus alignment guide is placed into the femoral canal and the distal femoral cutting block is pinned to remove 9 mm off the distal femur. Resection is made with an oscillating saw.      The tibia is subluxed forward and the menisci are removed. The extramedullary alignment guide is placed referencing proximally at the medial aspect of the tibial tubercle and distally along the second metatarsal axis and tibial crest. The block is pinned to remove 28mm off the more deficient medial  side. Resection is made with an oscillating saw. Size 6is the most appropriate size for the tibia and the proximal tibia is prepared with the modular drill and keel punch for that size.      The femoral sizing guide is placed and size 6 is most appropriate. Rotation is marked off the epicondylar axis and confirmed by creating a rectangular flexion gap at 90 degrees. The size 6 cutting block is pinned in this rotation and the anterior, posterior and chamfer cuts are made with the oscillating saw. The intercondylar block is then placed and that cut is made.      Trial size 6  tibial component, trial size 6 posterior stabilized femur and a 10  mm posterior stabilized rotating platform insert trial is placed. Full extension is achieved with excellent varus/valgus and anterior/posterior balance throughout full range of motion. The patella is everted and thickness measured to be 22  mm. Free hand resection is taken to 12 mm, a 35 template is placed, lug holes are drilled, trial patella is placed, and it tracks normally.  Osteophytes are removed off the posterior femur with the trial in place. All trials are removed and the cut bone surfaces prepared with pulsatile lavage. Cement is mixed and once ready for implantation, the size 6 tibial implant, size  6 posterior stabilized femoral component, and the size 35 patella are cemented in place and the patella is held with the clamp. The trial insert is placed and the knee held in full extension. The Exparel (20 ml mixed with 30 ml saline) and .25% Bupivicaine, are injected into the extensor mechanism, posterior capsule, medial and lateral gutters and subcutaneous tissues.  All extruded cement is removed and once the cement is hard the permanent 10 mm posterior stabilized rotating platform insert is placed into the tibial tray.      The wound is copiously irrigated with saline solution and the extensor mechanism closed over a hemovac drain with #1 V-loc suture. The tourniquet is released for a total tourniquet time of 39  minutes. Flexion against gravity is 140 degrees and the patella tracks normally. Subcutaneous tissue is closed with 2.0 vicryl and subcuticular with running 4.0 Monocryl. The incision is cleaned and dried and steri-strips and a bulky sterile dressing are applied. The limb is placed into a knee immobilizer and the patient is awakened and transported to recovery in stable condition.      Please note that a surgical assistant was a medical necessity for this procedure in order to perform it in a safe and expeditious manner. Surgical assistant was necessary to retract the ligaments and vital neurovascular structures to prevent injury to them and also necessary for proper positioning of the limb to allow for anatomic placement of the prosthesis.   Dione Plover Aphrodite Harpenau, MD    09/16/2013, 1:52 PM

## 2013-09-16 NOTE — Anesthesia Postprocedure Evaluation (Signed)
  Anesthesia Post-op Note  Patient: Leslie Robbins  Procedure(s) Performed: Procedure(s) (LRB): LEFT TOTAL KNEE ARTHROPLASTY (Left)  Patient Location: PACU  Anesthesia Type: Spinal  Level of Consciousness: awake and alert   Airway and Oxygen Therapy: Patient Spontanous Breathing  Post-op Pain: mild  Post-op Assessment: Post-op Vital signs reviewed, Patient's Cardiovascular Status Stable, Respiratory Function Stable, Patent Airway and No signs of Nausea or vomiting  Last Vitals:  Filed Vitals:   09/16/13 1445  BP: 131/76  Pulse: 55  Temp:   Resp: 12    Post-op Vital Signs: stable   Complications: No apparent anesthesia complications

## 2013-09-16 NOTE — H&P (View-Only) (Signed)
Leslie Robbins DOB: 1955/06/25 Married / Language: English / Race: White Female  Date of Admission:  09-16-2013 Chief Complaint:  Left Knee Pain History of Present Illness  The patient is a 58 year old female who comes in  for a preoperative history and physical. The patient is scheduled for a left total knee arthroplasty to be performed by Dr. Dione Plover. Aluisio, MD at [facility] on 09-16-2013. The patient is a 58 year old female who presents for follow up of their knee. The patient is being followed for their left knee pain and osteoarthritis. They are now month(s) out from Synvisc series. Symptoms reported today include: pain, pain in calf, locking, giving way, instability and difficulty ambulating. The patient feels that they are doing poorly and report their pain level to be moderate to severe. Current treatment includes: NSAIDs (meloxicam). The following medication has been used for pain control: Tylenol. The patient has reported improvement of their symptoms with: Cortisone injections (short term) and viscosupplementation (she reports her knees did not lock or catch as much after the visco, but the pain never really went away). The knee has been giving her more difficulty since her Synvisc has worn off now six months out. She would like to proceed with the total knee replacement at this time. They have been treated conservatively in the past for the above stated problem and despite conservative measures, they continue to have progressive pain and severe functional limitations and dysfunction. They have failed non-operative management including home exercise, medications, and injections. It is felt that they would benefit from undergoing total joint replacement. Risks and benefits of the procedure have been discussed with the patient and they elect to proceed with surgery. There are no active contraindications to surgery such as ongoing infection or rapidly progressive neurological  disease.  Allergies  Sulfathiazole *Sulfonamides** Itching, Rash.  Problem List/Past Medical Ok Edwards, III PA-C; 09/14/2013 2:00 PM) Primary osteoarthritis of left knee (715.16  M17.12) Osteoarthritis, Knee (715.96) (715.96  M17.9) Knee Pain (719.46  M25.569) Bursitis, hip (726.5  M70.70) Anxiety Disorder Depression Impaired Vision Hypertension Hypercholesterolemia Varicose veins Hiatal Hernia Gastroesophageal Reflux Disease Hemorrhoids Hepatitis Childhood with Jaundice Diverticulosis Hypothyroidism Measles   Family History Ok Edwards, III PA-C; 14-Sep-2013 2:06 PM) Father Deceased. age 29, Alcoholism, Suicide Mother Living. Lupus, Dementia  Social History Ok Edwards, III PA-C; September 14, 2013 2:03 PM) Tobacco use Former smoker. many years ago, but smoked very infrequently when she did Alcohol use Never consumed alcohol. Illicit drug use NONE Children 3 Living situation Lives with spouse. 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 Benazepril-Hydrochlorothiazide (20-25MG  Tablet, Oral) Active. (1 QD) Escitalopram Oxalate (20MG  Tablet, Oral) Active. (1 QD) Levothyroxine Sodium (137MCG Tablet, Oral) Active. (1 QD) Meloxicam (15MG  Tablet, Oral) Active. (QD) Clorazepate Dipotassium (3.75MG  Tablet, Oral) Active. (PRN) Atorvastatin Calcium (40MG  Tablet, Oral) Active. (1 QD)  Past Surgical History Ok Edwards, III PA-C; 09/14/2013 2:01 PM) Breast Biopsy Date: 1983. left Hysterectomy Date: 5. complete (non-cancerous) Carpal Tunnel Repair Date: 1982. right Tubal Ligation Date: 85. D & C Procedure 1976, 1993   Review of Systems (Alezandrew L. Serafina Topham III PA-C; Sep 14, 2013 1:56 PM) General Not Present- Chills,  Fatigue, Fever, Memory Loss, Night Sweats, Weight Gain and Weight Loss. Skin Not Present- Eczema, Hives, Itching, Lesions and Rash. HEENT Not Present- Dentures, Double Vision, Headache, Hearing Loss,  Tinnitus and Visual Loss. Respiratory Not Present- Allergies, Chronic Cough, Coughing up blood, Shortness of breath at rest and Shortness of breath with exertion. Cardiovascular Not Present- Chest Pain, Difficulty Breathing Lying Down, Murmur, Palpitations, Racing/skipping heartbeats and Swelling. Gastrointestinal Present- Nausea. Not Present- Abdominal Pain, Bloody Stool, Constipation, Diarrhea, Difficulty Swallowing, Heartburn, Jaundice, Loss of appetitie and Vomiting. Female Genitourinary Not Present- Blood in Urine, Discharge, Flank Pain, Incontinence, Painful Urination, Urgency, Urinary frequency, Urinary Retention, Urinating at Night and Weak urinary stream. Musculoskeletal Present- Back Pain, Joint Pain, Joint Swelling and Morning Stiffness. Not Present- Muscle Pain, Muscle Weakness and Spasms. Neurological Present- Headaches. Not Present- Blackout spells, Difficulty with balance, Dizziness, Paralysis, Tremor and Weakness. Psychiatric Not Present- Insomnia.   Vitals  Weight: 195 lb Height: 64in Weight was reported by patient. Height was reported by patient. Body Surface Area: 2 m Body Mass Index: 33.47 kg/m Pulse: 64 (Regular)  BP: 102/70 (Sitting, Left Arm, Standard)    Physical Exam (Alezandrew L. Christipher Rieger III PA-C; 09/05/2013 2:11 PM) General Mental Status -Alert, cooperative and good historian. General Appearance-pleasant, Not in acute distress. Orientation-Oriented X3. Build & Nutrition-Well nourished and Well developed.  Head and Neck Head-normocephalic, atraumatic . Neck Global Assessment - supple, no bruit auscultated on the right, no bruit auscultated on the left.  Eye Vision-Wears corrective lenses. Pupil - Bilateral-Regular and Round. Motion -  Bilateral-EOMI.  Chest and Lung Exam Auscultation Breath sounds - clear at anterior chest wall and clear at posterior chest wall. Adventitious sounds - No Adventitious sounds.  Cardiovascular Auscultation Rhythm - Regular rate and rhythm. Heart Sounds - S1 WNL and S2 WNL. Murmurs & Other Heart Sounds - Auscultation of the heart reveals - No Murmurs.  Abdomen Palpation/Percussion Tenderness - Abdomen is non-tender to palpation. Rigidity (guarding) - Abdomen is soft. Auscultation Auscultation of the abdomen reveals - Bowel sounds normal.  Female Genitourinary Note: Not done, not pertinent to present illness   Musculoskeletal Note: Today her knee shows slight varus deformity. She has crepitation throughout the range of motion. Tenderness in the medial joint line.  RADIOGRAPHS: x rays are obtained today and do show advanced patellofemoral and medial compartment arthritis, and moderately severe lateral compartment arthritis.    Assessment & Plan (Alezandrew L. Derry Kassel III PA-C; 09/05/2013 1:54 PM) Primary osteoarthritis of left knee (715.16  M17.12) Note:Plan is for a Left Total Knee Replacement by Dr. Wynelle Link.  Plan is to go home.  PCP - [facility], Ronnald Collum FNP  The patient does not have any contraindications and will receive TXA (tranexamic acid) prior to surgery.  Signed electronically by Ok Edwards, III PA-C

## 2013-09-16 NOTE — Anesthesia Preprocedure Evaluation (Signed)
Anesthesia Evaluation  Patient identified by MRN, date of birth, ID band Patient awake    Reviewed: Allergy & Precautions, H&P , NPO status , Patient's Chart, lab work & pertinent test results  Airway Mallampati: II TM Distance: >3 FB Neck ROM: full    Dental no notable dental hx. (+) Teeth Intact, Dental Advisory Given   Pulmonary neg pulmonary ROS,  breath sounds clear to auscultation  Pulmonary exam normal       Cardiovascular Exercise Tolerance: Good hypertension, On Medications Rhythm:regular Rate:Normal     Neuro/Psych Anxiety Depression negative neurological ROS  negative psych ROS   GI/Hepatic negative GI ROS, Neg liver ROS, GERD-  Medicated and Controlled,  Endo/Other  negative endocrine ROSHypothyroidism   Renal/GU negative Renal ROS  negative genitourinary   Musculoskeletal   Abdominal   Peds  Hematology negative hematology ROS (+)   Anesthesia Other Findings   Reproductive/Obstetrics negative OB ROS                           Anesthesia Physical Anesthesia Plan  ASA: II  Anesthesia Plan: Spinal   Post-op Pain Management:    Induction:   Airway Management Planned: Simple Face Mask  Additional Equipment:   Intra-op Plan:   Post-operative Plan:   Informed Consent: I have reviewed the patients History and Physical, chart, labs and discussed the procedure including the risks, benefits and alternatives for the proposed anesthesia with the patient or authorized representative who has indicated his/her understanding and acceptance.   Dental Advisory Given  Plan Discussed with: CRNA and Surgeon  Anesthesia Plan Comments:         Anesthesia Quick Evaluation

## 2013-09-16 NOTE — Interval H&P Note (Signed)
History and Physical Interval Note:  09/16/2013 10:51 AM  Leslie Robbins  has presented today for surgery, with the diagnosis of left knee osteoarthritis  The various methods of treatment have been discussed with the patient and family. After consideration of risks, benefits and other options for treatment, the patient has consented to  Procedure(s): LEFT TOTAL KNEE ARTHROPLASTY (Left) as a surgical intervention .  The patient's history has been reviewed, patient examined, no change in status, stable for surgery.  I have reviewed the patient's chart and labs.  Questions were answered to the patient's satisfaction.     Gearlean Alf

## 2013-09-16 NOTE — Transfer of Care (Signed)
Immediate Anesthesia Transfer of Care Note  Patient: Leslie Robbins  Procedure(s) Performed: Procedure(s): LEFT TOTAL KNEE ARTHROPLASTY (Left)  Patient Location: PACU  Anesthesia Type:Regional  Level of Consciousness: awake, alert  and oriented  Airway & Oxygen Therapy: Patient Spontanous Breathing and Patient connected to face mask oxygen  Post-op Assessment: Report given to PACU RN and Post -op Vital signs reviewed and stable  Post vital signs: Reviewed and stable  Complications: No apparent anesthesia complications

## 2013-09-16 NOTE — Anesthesia Procedure Notes (Signed)
Spinal  Patient location during procedure: OR End time: 09/16/2013 12:50 PM Staffing CRNA/Resident: Noralyn Pick Performed by: anesthesiologist and resident/CRNA  Preanesthetic Checklist Completed: patient identified, site marked, surgical consent, pre-op evaluation, timeout performed, IV checked, risks and benefits discussed and monitors and equipment checked Spinal Block Patient position: sitting Prep: Betadine Patient monitoring: heart rate, continuous pulse ox and blood pressure Approach: midline Location: L2-3 Injection technique: single-shot Needle Needle type: Sprotte  Needle gauge: 24 G Needle length: 9 cm Assessment Sensory level: T6 Additional Notes Expiration date of kit checked and confirmed. Patient tolerated procedure well, without complications.

## 2013-09-17 LAB — CBC
HCT: 36.2 % (ref 36.0–46.0)
Hemoglobin: 11.9 g/dL — ABNORMAL LOW (ref 12.0–15.0)
MCH: 29.5 pg (ref 26.0–34.0)
MCHC: 32.9 g/dL (ref 30.0–36.0)
MCV: 89.8 fL (ref 78.0–100.0)
PLATELETS: 170 10*3/uL (ref 150–400)
RBC: 4.03 MIL/uL (ref 3.87–5.11)
RDW: 13.6 % (ref 11.5–15.5)
WBC: 9.2 10*3/uL (ref 4.0–10.5)

## 2013-09-17 LAB — BASIC METABOLIC PANEL
ANION GAP: 9 (ref 5–15)
BUN: 18 mg/dL (ref 6–23)
CALCIUM: 9.5 mg/dL (ref 8.4–10.5)
CO2: 29 mEq/L (ref 19–32)
Chloride: 101 mEq/L (ref 96–112)
Creatinine, Ser: 0.93 mg/dL (ref 0.50–1.10)
GFR calc Af Amer: 76 mL/min — ABNORMAL LOW (ref >90)
GFR calc non Af Amer: 65 mL/min — ABNORMAL LOW (ref >90)
Glucose, Bld: 138 mg/dL — ABNORMAL HIGH (ref 70–99)
Potassium: 4 mEq/L (ref 3.7–5.3)
Sodium: 139 mEq/L (ref 137–147)

## 2013-09-17 MED ORDER — OMEPRAZOLE 20 MG PO CPDR
40.0000 mg | DELAYED_RELEASE_CAPSULE | Freq: Every day | ORAL | Status: DC
  Administered 2013-09-17 – 2013-09-18 (×2): 40 mg via ORAL
  Filled 2013-09-17 (×2): qty 2

## 2013-09-17 MED ORDER — PROMETHAZINE HCL 25 MG PO TABS: 25.0000 mg | ORAL_TABLET | Freq: Four times a day (QID) | ORAL | Status: DC | PRN

## 2013-09-17 MED ORDER — NON FORMULARY: 40.0000 mg | Freq: Every day | Status: DC

## 2013-09-17 MED ORDER — PROMETHAZINE HCL 25 MG/ML IJ SOLN: 6.2500 mg | Freq: Four times a day (QID) | INTRAMUSCULAR | Status: DC | PRN

## 2013-09-17 NOTE — Evaluation (Signed)
Physical Therapy Evaluation Patient Details Name: Leslie Robbins MRN: [id number] DOB: [birth year] Today's Date: 09/17/2013   History of Present Illness  LTKA  Clinical Impression  Pt tolerated  Ambulation, did c/o mild dizziness while ambulating. BP 127/77.Pt will benefit from PT to address problems listed in chart.    Follow Up Recommendations Home health PT;Supervision/Assistance - 24 hour    Equipment Recommendations  Rolling walker with 5" wheels;3in1 (PT)    Recommendations for Other Services       Precautions / Restrictions Precautions Precautions: Knee Required Braces or Orthoses: Knee Immobilizer - Left Restrictions Weight Bearing Restrictions: No      Mobility  Bed Mobility Overal bed mobility: Needs Assistance Bed Mobility: Supine to Sit     Supine to sit: Min assist     General bed mobility comments: cues for technique  Transfers Overall transfer level: Needs assistance Equipment used: Rolling walker (2 wheeled) Transfers: Sit to/from Stand Sit to Stand: Min assist         General transfer comment: cues for hand and LLE position, safety with RW  Ambulation/Gait Ambulation/Gait assistance: Min assist Ambulation Distance (Feet): 30 Feet Assistive device: Rolling walker (2 wheeled) Gait Pattern/deviations: Step-to pattern;Antalgic     General Gait Details: cues for posture and sequence and step length  Stairs            Wheelchair Mobility    Modified Rankin (Stroke Patients Only)       Balance                                             Pertinent Vitals/Pain 4-6 with mobility, premedicated, ice applied.    Home Living Family/patient expects to be discharged to:: Private residence Living Arrangements: Spouse/significant other;Children Available Help at Discharge: Family Type of Home: House Home Access: Stairs to enter Entrance Stairs-Rails: Right;Left;Can reach both Technical brewer of Steps: 3 Home  Layout: One level Home Equipment: None      Prior Function Level of Independence: Independent               Hand Dominance        Extremity/Trunk Assessment               Lower Extremity Assessment: LLE deficits/detail   LLE Deficits / Details: knee flexion to 40, performs 1 SLR     Communication   Communication: No difficulties  Cognition Arousal/Alertness: Awake/alert Behavior During Therapy: WFL for tasks assessed/performed Overall Cognitive Status: Within Functional Limits for tasks assessed                      General Comments      Exercises Total Joint Exercises Quad Sets: AROM;Both;10 reps;Supine Heel Slides: AAROM;Left;10 reps;Supine Straight Leg Raises: AAROM;Left;10 reps;Supine      Assessment/Plan    PT Assessment    PT Diagnosis     PT Problem List    PT Treatment Interventions     PT Goals (Current goals can be found in the Care Plan section) Acute Rehab PT Goals Patient Stated Goal: I want to get this pain out of my R hip. I have favored the left leg PT Goal Formulation: With patient Time For Goal Achievement: 09/20/13 Potential to Achieve Goals: Good    Frequency     Barriers to discharge        Co-evaluation  End of Session Equipment Utilized During Treatment: Gait belt Activity Tolerance: Patient tolerated treatment well Patient left: in chair;with call bell/phone within reach;with family/visitor present Nurse Communication: Mobility status (Bleeding through dressing. reinforced with ABD and 2 ace wraps.)         Time: 2542-7062 PT Time Calculation (min): 45 min   Charges:   PT Evaluation $Initial PT Evaluation Tier I: 1 Procedure PT Treatments $Gait Training: 23-37 mins $Therapeutic Exercise: 8-22 mins   PT G Codes:          Claretha Cooper 09/17/2013, 10:09 AM  Tresa Endo PT [phone]

## 2013-09-17 NOTE — Evaluation (Addendum)
Occupational Therapy Evaluation Patient Details Name: Leslie Robbins MRN: [id number] DOB: [birth year] Today's Date: 09/17/2013    History of Present Illness LTKA   Clinical Impression   Pt up to the 3in1 to practice toilet transfer and had an anxiety attack while transferring back out of bathroom. Nursing called to room and aware of all. Pt will benefit from continued OT to progress ADL independence for d/c home with family.    Follow Up Recommendations  No OT follow up;Supervision/Assistance - 24 hour    Equipment Recommendations  3 in 1 bedside comode    Recommendations for Other Services       Precautions / Restrictions Precautions Precautions: Knee Required Braces or Orthoses: Knee Immobilizer - Left Restrictions Weight Bearing Restrictions: No      Mobility Bed Mobility Overal bed mobility: Needs Assistance Bed Mobility: Sit to Supine      Sit to supine: Min assist   General bed mobility comments: assist for LEs onto bed  Transfers Overall transfer level: Needs assistance Equipment used: Rolling walker (2 wheeled) Transfers: Sit to/from Stand Sit to Stand: Min assist         General transfer comment: verbal cues for hand placement and LE management.    Balance                                            ADL Overall ADL's : Needs assistance/impaired Eating/Feeding: Independent;Sitting   Grooming: Wash/dry hands;Minimal assistance;Standing   Upper Body Bathing: Set up;Sitting;Supervision/ safety   Lower Body Bathing: Moderate assistance;Sit to/from stand   Upper Body Dressing : Set up;Supervision/safety;Sitting   Lower Body Dressing: Moderate assistance;Sit to/from stand   Toilet Transfer: Minimal assistance;RW;Ambulation;BSC   Toileting- Clothing Manipulation and Hygiene: Minimal assistance;Sit to/from stand         General ADL Comments: Pt up to 3in1 and was able to void. Stood at the sink to wash hands and started to exit  bathroom with walker and pt became short of breath  and started breathing heaver. Pulled up 3in1 for pt to sit down. Called nursing to the room. Pt and family member both report that pt has history of anxiety attacks. Attempted to calm pt and encourage purse lip breathing. Nursing in room and O2 sats wouldnt read due to ? cold fingers. Pt able to calm after sitting on BSC for several minutes. Assisted pt to return to bed to rest. Nursing in room with return to bed also and aware of all. Discussued 3in1 recommendation with pt as she doesnt have a vanity or anything to hold to on side of toilet. Discussed use of 3in1 as shower chair but pt states she cant fit it in shower with her built in seat also. She states she will likely sponge bathe a few days. Discussed Ki and how to don, when to wear.      Vision                     Perception     Praxis      Pertinent Vitals/Pain 2/10 at rest L knee; 5/10 with activity ; reposition, ice     Hand Dominance     Extremity/Trunk Assessment Upper Extremity Assessment Upper Extremity Assessment: Overall WFL for tasks assessed   Lower Extremity Assessment Lower Extremity Assessment: LLE deficits/detail LLE Deficits / Details: knee flexion to 40, performs  1 SLR       Communication Communication Communication: No difficulties   Cognition Arousal/Alertness: Awake/alert Behavior During Therapy: Anxious Overall Cognitive Status: Within Functional Limits for tasks assessed                     General Comments       Exercises       Shoulder Instructions      Home Living Family/patient expects to be discharged to:: Private residence Living Arrangements: Spouse/significant other;Children Available Help at Discharge: Family Type of Home: House Home Access: Stairs to enter Technical brewer of Steps: 3 Entrance Stairs-Rails: Right;Left;Can reach both [place]: One level     Bathroom Shower/Tub: Medical illustrator: Handicapped height     Home Equipment: None          Prior Functioning/Environment Level of Independence: Independent             OT Diagnosis: Generalized weakness   OT Problem List: Decreased strength;Decreased knowledge of use of DME or AE   OT Treatment/Interventions: Self-care/ADL training;Patient/family education;Therapeutic activities;DME and/or AE instruction    OT Goals(Current goals can be found in the care plan section) Acute Rehab OT Goals Patient Stated Goal: agreeable to OT OT Goal Formulation: With patient Time For Goal Achievement: 09/24/13 Potential to Achieve Goals: Good ADL Goals Pt Will Perform Grooming: with min guard assist;standing Pt Will Perform Lower Body Dressing: with min assist;sit to/from stand Pt Will Transfer to Toilet: with min guard assist;ambulating;bedside commode Pt Will Perform Toileting - Clothing Manipulation and hygiene: with min guard assist;sit to/from stand Pt Will Perform Tub/Shower Transfer: with min assist;Shower transfer  OT Frequency: Min 2X/week   Barriers to D/C:            Co-evaluation              End of Session Equipment Utilized During Treatment: Gait belt;Left knee immobilizer;Rolling walker  Activity Tolerance: Other (comment) (anxiety attack) Patient left: in bed;with call bell/phone within reach;with family/visitor present   Time: 1219-7588 OT Time Calculation (min): 42 min Charges:  OT General Charges $OT Visit: 1 Procedure OT Evaluation $Initial OT Evaluation Tier I: 1 Procedure OT Treatments $Self Care/Home Management : 8-22 mins $Therapeutic Activity: 23-37 mins G-Codes:    Jules Schick [phone] 09/17/2013, 11:59 AM

## 2013-09-17 NOTE — Discharge Instructions (Addendum)
° °Dr. Frank Aluisio °Total Joint Specialist °[facility] °[address] °[place], [place] [address] °[phone] ° °TOTAL KNEE REPLACEMENT POSTOPERATIVE DIRECTIONS ° ° ° °Knee Rehabilitation, Guidelines Following Surgery  °Results after knee surgery are often greatly improved when you follow the exercise, range of motion and muscle strengthening exercises prescribed by your doctor. Safety measures are also important to protect the knee from further injury. Any time any of these exercises cause you to have increased pain or swelling in your knee joint, decrease the amount until you are comfortable again and slowly increase them. If you have problems or questions, call your caregiver or physical therapist for advice.  ° °HOME CARE INSTRUCTIONS  °Remove items at home which could result in a fall. This includes throw rugs or furniture in walking pathways.  °Continue medications as instructed at time of discharge. °You may have some home medications which will be placed on hold until you complete the course of blood thinner medication.  °You may start showering once you are discharged home but do not submerge the incision under water. Just pat the incision dry and apply a dry gauze dressing on daily. °Walk with walker as instructed.  °You may resume a sexual relationship in one month or when given the OK by  your doctor.  °· Use walker as long as suggested by your caregivers. °· Avoid periods of inactivity such as sitting longer than an hour when not asleep. This helps prevent blood clots.  °You may put full weight on your legs and walk as much as is comfortable.  °You may return to work once you are cleared by your doctor.  °Do not drive a car for 6 weeks or until released by you surgeon.  °· Do not drive while taking narcotics.  °Wear the elastic stockings for three weeks following surgery during the day but you may remove then at night. °Make sure you keep all of your appointments after your  operation with all of your doctors and caregivers. You should call the office at the above phone number and make an appointment for approximately two weeks after the date of your surgery. °Change the dressing daily and reapply a dry dressing each time. °Please pick up a stool softener and laxative for home use as long as you are requiring pain medications. °· Continue to use ice on the knee for pain and swelling from surgery. You may notice swelling that will progress down to the foot and ankle.  This is normal after surgery.  Elevate the leg when you are not up walking on it.   °It is important for you to complete the blood thinner medication as prescribed by your doctor. °· Continue to use the breathing machine which will help keep your temperature down.  It is common for your temperature to cycle up and down following surgery, especially at night when you are not up moving around and exerting yourself.  The breathing machine keeps your lungs expanded and your temperature down. ° °RANGE OF MOTION AND STRENGTHENING EXERCISES  °Rehabilitation of the knee is important following a knee injury or an operation. After just a few days of immobilization, the muscles of the thigh which control the knee become weakened and shrink (atrophy). Knee exercises are designed to build up the tone and strength of the thigh muscles and to improve knee motion. Often times heat used for twenty to thirty minutes before working out will loosen up your tissues and help with improving the   range of motion but do not use heat for the first two weeks following surgery. These exercises can be done on a training (exercise) mat, on the floor, on a table or on a bed. Use what ever works the best and is most comfortable for you Knee exercises include:  Leg Lifts - While your knee is still immobilized in a splint or cast, you can do straight leg raises. Lift the leg to 60 degrees, hold for 3 sec, and slowly lower the leg. Repeat 10-20 times 2-3  times daily. Perform this exercise against resistance later as your knee gets better.  Quad and Hamstring Sets - Tighten up the muscle on the front of the thigh (Quad) and hold for 5-10 sec. Repeat this 10-20 times hourly. Hamstring sets are done by pushing the foot backward against an object and holding for 5-10 sec. Repeat as with quad sets.  A rehabilitation program following serious knee injuries can speed recovery and prevent re-injury in the future due to weakened muscles. Contact your doctor or a physical therapist for more information on knee rehabilitation.   SKILLED REHAB INSTRUCTIONS: If the patient is transferred to a skilled rehab facility following release from the hospital, a list of the current medications will be sent to the facility for the patient to continue.  When discharged from the skilled rehab facility, please have the facility set up the patient's [place] prior to being released. Also, the skilled facility will be responsible for providing the patient with their medications at time of release from the facility to include their pain medication, the muscle relaxants, and their blood thinner medication. If the patient is still at the rehab facility at time of the two week follow up appointment, the skilled rehab facility will also need to assist the patient in arranging follow up appointment in our office and any transportation needs.  MAKE SURE YOU:  Understand these instructions.  Will watch your condition.  Will get help right away if you are not doing well or get worse.    Pick up stool softner and laxative for home. Do not submerge incision under water. May shower. Continue to use ice for pain and swelling from surgery.  Take Xarelto for two and a half more weeks, then discontinue Xarelto. Once the patient has completed the blood thinner regimen, then take a Baby 81 mg Aspirin daily for three more weeks.    Information on my medicine - XARELTO  (Rivaroxaban)  This medication education was reviewed with me or my healthcare representative as part of my discharge preparation.  The pharmacist that spoke with me during my hospital stay was:  WOFFORD, DREW A, RPH  Why was Xarelto prescribed for you? Xarelto was prescribed for you to reduce the risk of blood clots forming after orthopedic surgery. The medical term for these abnormal blood clots is venous thromboembolism (VTE).  What do you need to know about xarelto ? Take your Xarelto ONCE DAILY at the same time every day. You may take it either with or without food.  If you have difficulty swallowing the tablet whole, you may crush it and mix in applesauce just prior to taking your dose.  Take Xarelto exactly as prescribed by your doctor and DO NOT stop taking Xarelto without talking to the doctor who prescribed the medication.  Stopping without other VTE prevention medication to take the place of Xarelto may increase your risk of developing a clot.  After discharge, you should have regular  check-up appointments with your healthcare provider that is prescribing your Xarelto.    What do you do if you miss a dose? If you miss a dose, take it as soon as you remember on the same day then continue your regularly scheduled once daily regimen the next day. Do not take two doses of Xarelto on the same day.   Important Safety Information A possible side effect of Xarelto is bleeding. You should call your healthcare provider right away if you experience any of the following:   Bleeding from an injury or your nose that does not stop.   Unusual colored urine (red or dark brown) or unusual colored stools (red or black).   Unusual bruising for unknown reasons.   A serious fall or if you hit your head (even if there is no bleeding).  Some medicines may interact with Xarelto and might increase your risk of bleeding while on Xarelto. To help avoid this, consult your healthcare provider or  pharmacist prior to using any new prescription or non-prescription medications, including herbals, vitamins, non-steroidal anti-inflammatory drugs (NSAIDs) and supplements.  This website has more information on Xarelto: [email or web address]

## 2013-09-17 NOTE — Progress Notes (Signed)
   Subjective: 1 Day Post-Op Procedure(s) (LRB): LEFT TOTAL KNEE ARTHROPLASTY (Left) Patient reports pain as moderate and severe last night but a little better this morning. Patient seen in rounds with Dr. Wynelle Link. Patient is well, but has had some minor complaints of pain in the knee, requiring pain medications We will start therapy today.  Plan is to go Home after hospital stay.  Objective: Vital signs in last 24 hours: Temp:  [96.9 F (36.1 C)-98.3 F (36.8 C)] 97.3 F (36.3 C) (07/14 0540) Pulse Rate:  [50-63] 50 (07/14 0540) Resp:  [12-26] 16 (07/14 0727) BP: (111-146)/(75-93) 111/75 mmHg (07/14 0540) SpO2:  [97 %-100 %] 100 % (07/14 0727) Weight:  [91.627 kg (202 lb)] 91.627 kg (202 lb) (07/13 1031)  Intake/Output from previous day:  Intake/Output Summary (Last 24 hours) at 09/17/13 0800 Last data filed at 09/17/13 0701  Gross per 24 hour  Intake 3207.5 ml  Output   2980 ml  Net  227.5 ml    Intake/Output this shift: UOP 1525 +227  Labs:  Recent Labs  09/17/13 0440  HGB 11.9*    Recent Labs  09/17/13 0440  WBC 9.2  RBC 4.03  HCT 36.2  PLT 170    Recent Labs  09/17/13 0440  NA 139  K 4.0  CL 101  CO2 29  BUN 18  CREATININE 0.93  GLUCOSE 138*  CALCIUM 9.5   No results found for this basename: LABPT, INR,  in the last 72 hours  EXAM General - Patient is Alert, Appropriate and Oriented Extremity - Neurovascular intact Sensation intact distally Dressing - dressing C/D/I Motor Function - intact, moving foot and toes well on exam.  Hemovac pulled without difficulty.  Past Medical History  Diagnosis Date  . Depression   . Hypothyroidism   . Hypertension   . Hypercholesteremia   . Arthritis   . GERD (gastroesophageal reflux disease)   . Anxiety     Assessment/Plan: 1 Day Post-Op Procedure(s) (LRB): LEFT TOTAL KNEE ARTHROPLASTY (Left) Principal Problem:   OA (osteoarthritis) of knee  Estimated body mass index is 34.66 kg/(m^2) as  calculated from the following:   Height as of this encounter: 5\' 4"  (1.626 m).   Weight as of this encounter: 91.627 kg (202 lb). Advance diet Up with therapy Plan for discharge tomorrow Discharge home with home health  DVT Prophylaxis - Xarelto Weight-Bearing as tolerated to left leg D/C O2 and Pulse OX and try on Room Air  Arlee Muslim, PA-C Orthopaedic Surgery 09/17/2013, 8:00 AM

## 2013-09-17 NOTE — Progress Notes (Signed)
Physical Therapy Treatment Patient Details Name: AAHNA ROSSA MRN: [id number] DOB: 03-27-1955 Today's Date: 09/17/2013    History of Present Illness LTKA    PT Comments    Pt reports having a panic attack but is not as pronounced as w/OT earlier. Pt did ambulate back to room.   Follow Up Recommendations  Home health PT;Supervision/Assistance - 24 hour     Equipment Recommendations  Rolling walker with 5" wheels;3in1 (PT)    Recommendations for Other Services       Precautions / Restrictions Precautions Precautions: Knee Precaution Comments: has panic attacks Required Braces or Orthoses: Knee Immobilizer - Left Restrictions Weight Bearing Restrictions: No    Mobility  Bed Mobility   Bed Mobility: Supine to Sit;Sit to Supine     Supine to sit: Min guard Sit to supine: Min guard   General bed mobility comments: assist for LEs onto bed  Transfers Overall transfer level: Needs assistance Equipment used: Rolling walker (2 wheeled) Transfers: Sit to/from [facility] Sit to Stand: Min guard Stand pivot transfers: Min guard       General transfer comment: verbal cues for hand placement and LE management.  Ambulation/Gait Ambulation/Gait assistance: Min assist Ambulation Distance (Feet): 50 Feet Assistive device: Rolling walker (2 wheeled) Gait Pattern/deviations: Step-to pattern     General Gait Details: cues for posture and sequence and step length, while walking pt reports feeling like she cannot breathe, encouraged pt to purse lip breathe. Pt worked through respiratory issue and able to walk back to room.   Stairs            Wheelchair Mobility    Modified Rankin (Stroke Patients Only)       Balance                                    Cognition Arousal/Alertness: Awake/alert Behavior During Therapy: Anxious Overall Cognitive Status: Within Functional Limits for tasks assessed                       Exercises      General Comments General comments (skin integrity, edema, etc.): min assist to groom at the sink with walker.      Pertinent Vitals/Pain Pain ,4    Home Living Family/patient expects to be discharged to:: Private residence Living Arrangements: Spouse/significant other;Children Available Help at Discharge: Family Type of Home: House Home Access: Stairs to enter Entrance Stairs-Rails: Right;Left;Can reach both Home Layout: One level Home Equipment: None      Prior Function Level of Independence: Independent          PT Goals (current goals can now be found in the care plan section) Acute Rehab PT Goals Patient Stated Goal: agreeable to OT Progress towards PT goals: Progressing toward goals    Frequency  7X/week    PT Plan Current plan remains appropriate    Co-evaluation             End of Session Equipment Utilized During Treatment: Gait belt Activity Tolerance: Patient tolerated treatment well Patient left: with call bell/phone within reach;with family/visitor present;in bed     Time: 5188-4166 PT Time Calculation (min): 25 min  Charges:  $Gait Training: 23-37 mins                    G Codes:      Claretha Cooper 09/17/2013, 3:10 PM

## 2013-09-18 LAB — CBC
HCT: 33.8 % — ABNORMAL LOW (ref 36.0–46.0)
Hemoglobin: 11.2 g/dL — ABNORMAL LOW (ref 12.0–15.0)
MCH: 29.4 pg (ref 26.0–34.0)
MCHC: 33.1 g/dL (ref 30.0–36.0)
MCV: 88.7 fL (ref 78.0–100.0)
Platelets: 178 10*3/uL (ref 150–400)
RBC: 3.81 MIL/uL — ABNORMAL LOW (ref 3.87–5.11)
RDW: 13.7 % (ref 11.5–15.5)
WBC: 8.4 10*3/uL (ref 4.0–10.5)

## 2013-09-18 LAB — BASIC METABOLIC PANEL
Anion gap: 10 (ref 5–15)
BUN: 23 mg/dL (ref 6–23)
CO2: 30 mEq/L (ref 19–32)
Calcium: 9.5 mg/dL (ref 8.4–10.5)
Chloride: 100 mEq/L (ref 96–112)
Creatinine, Ser: 0.94 mg/dL (ref 0.50–1.10)
GFR, EST AFRICAN AMERICAN: 75 mL/min — AB (ref >90)
GFR, EST NON AFRICAN AMERICAN: 65 mL/min — AB (ref >90)
Glucose, Bld: 117 mg/dL — ABNORMAL HIGH (ref 70–99)
POTASSIUM: 4 meq/L (ref 3.7–5.3)
SODIUM: 140 meq/L (ref 137–147)

## 2013-09-18 MED ORDER — OXYCODONE HCL 5 MG PO TABS: 5.0000 mg | ORAL_TABLET | ORAL | Status: DC | PRN

## 2013-09-18 MED ORDER — METHOCARBAMOL 500 MG PO TABS: 500.0000 mg | ORAL_TABLET | Freq: Four times a day (QID) | ORAL | Status: DC | PRN

## 2013-09-18 MED ORDER — RIVAROXABAN 10 MG PO TABS: 10.0000 mg | ORAL_TABLET | Freq: Every day | ORAL | Status: DC

## 2013-09-18 NOTE — Plan of Care (Signed)
Problem: Phase III Progression Outcomes Goal: Anticoagulant follow-up in place Outcome: Not Applicable Date Met:  05/25/20 Xarelto VTE, no f/u needed.

## 2013-09-18 NOTE — Progress Notes (Signed)
PT DC instructions reviewed with family at the bedside. All questions and concerns were addressed, prescriptions given and reviewed. Pt denies pain at this time, VSS, no apparent distress or discomfort at this time, dsg c/d/i- all other skin intact, supplies given for dsg changes at home. Pt will make her own follow up appt within the next two weeks. Pt waiting on [facility] equipment to arrive. HH PT to see her tomorrow from [facility].

## 2013-09-18 NOTE — Progress Notes (Signed)
Physical Therapy Treatment Patient Details Name: IMANIE DARROW MRN: [id number] DOB: [birth year] Today's Date: 09/18/2013    History of Present Illness LTKA    PT Comments    Pt tolerating activity. No "panic attack" today. Plan for PT after noon , then Dc.  Follow Up Recommendations  Home health PT;Supervision/Assistance - 24 hour     Equipment Recommendations  Rolling walker with 5" wheels;3in1 (PT)    Recommendations for Other Services       Precautions / Restrictions Precautions Precautions: Knee Precaution Comments: has panic attacks    Mobility  Bed Mobility   Bed Mobility: Supine to Sit     Supine to sit: Min guard        Transfers Overall transfer level: Needs assistance Equipment used: Rolling walker (2 wheeled) Transfers: Sit to/from Stand Sit to Stand: Min guard;Supervision         General transfer comment: verbal cues for hand placement and LE management.  Ambulation/Gait Ambulation/Gait assistance: Min guard Ambulation Distance (Feet): 80 Feet Assistive device: Rolling walker (2 wheeled) Gait Pattern/deviations: Step-to pattern;Antalgic     General Gait Details: cues for posture and sequence and step length, while walking pt reports feeling like she cannot breathe, encouraged pt to purse lip breathe. Pt worked through respiratory issue and able to walk back to room.   Stairs Stairs: Yes Stairs assistance: Min assist Stair Management: Two rails;Step to pattern;Forwards Number of Stairs: 2 General stair comments: pt tends to stand sideways and states did this before.   Wheelchair Mobility    Modified Rankin (Stroke Patients Only)       Balance                                    Cognition Arousal/Alertness: Awake/alert                          Exercises      General Comments        Pertinent Vitals/Pain 3 , took meds.    Home Living                      Prior Function            PT  Goals (current goals can now be found in the care plan section) Progress towards PT goals: Progressing toward goals    Frequency  7X/week    PT Plan Current plan remains appropriate    Co-evaluation             End of Session   Activity Tolerance: Patient tolerated treatment well Patient left: in chair;with family/visitor present;with call bell/phone within reach     Time: 1157-2620 PT Time Calculation (min): 41 min  Charges:  $Gait Training: 8-22 mins $Therapeutic Exercise: 8-22 mins $Self Care/Home Management: 8-22                    G Codes:      Claretha Cooper 09/18/2013, 9:31 AM

## 2013-09-18 NOTE — Progress Notes (Signed)
   Subjective: 2 Days Post-Op Procedure(s) (LRB): LEFT TOTAL KNEE ARTHROPLASTY (Left) Patient reports pain as mild.    Doing better. Patient seen in rounds with Dr. Wynelle Link. Patient is well, and has had no acute complaints or problems Patient is ready to go home after two sessions.  Objective: Vital signs in last 24 hours: Temp:  [97.7 F (36.5 C)-98.8 F (37.1 C)] 97.7 F (36.5 C) (07/15 0435) Pulse Rate:  [57-70] 70 (07/15 0435) Resp:  [16-18] 16 (07/15 0435) BP: (92-148)/(50-68) 100/65 mmHg (07/15 0435) SpO2:  [94 %-98 %] 96 % (07/15 0435)  Intake/Output from previous day:  Intake/Output Summary (Last 24 hours) at 09/18/13 1014 Last data filed at 09/18/13 0435  Gross per 24 hour  Intake    720 ml  Output   2950 ml  Net  -2230 ml    Intake/Output this shift:    Labs:  Recent Labs  09/17/13 0440 09/18/13 0516  HGB 11.9* 11.2*    Recent Labs  09/17/13 0440 09/18/13 0516  WBC 9.2 8.4  RBC 4.03 3.81*  HCT 36.2 33.8*  PLT 170 178    Recent Labs  09/17/13 0440 09/18/13 0516  NA 139 140  K 4.0 4.0  CL 101 100  CO2 29 30  BUN 18 23  CREATININE 0.93 0.94  GLUCOSE 138* 117*  CALCIUM 9.5 9.5   No results found for this basename: LABPT, INR,  in the last 72 hours  EXAM: General - Patient is Alert, Appropriate and Oriented Extremity - Neurovascular intact Sensation intact distally Dorsiflexion/Plantar flexion intact Incision - clean, dry, no drainage, healing Motor Function - intact, moving foot and toes well on exam.   Assessment/Plan: 2 Days Post-Op Procedure(s) (LRB): LEFT TOTAL KNEE ARTHROPLASTY (Left) Procedure(s) (LRB): LEFT TOTAL KNEE ARTHROPLASTY (Left) Past Medical History  Diagnosis Date  . Depression   . Hypothyroidism   . Hypertension   . Hypercholesteremia   . Arthritis   . GERD (gastroesophageal reflux disease)   . Anxiety    Principal Problem:   OA (osteoarthritis) of knee  Estimated body mass index is 34.66 kg/(m^2) as  calculated from the following:   Height as of this encounter: 5\' 4"  (1.626 m).   Weight as of this encounter: 91.627 kg (202 lb). Up with therapy Discharge home with home health Diet - Cardiac diet Follow up - in two weeks Activity - WBAT Disposition - Home Condition Upon Discharge - Good D/C Meds - See DC Summary DVT Prophylaxis - Xarelto  Arlee Muslim, PA-C Orthopaedic Surgery 09/18/2013, 10:14 AM

## 2013-09-18 NOTE — Progress Notes (Signed)
Occupational Therapy Treatment Patient Details Name: WANETTA FUNDERBURKE MRN: [id number] DOB: 04-12-1955 Today's Date: 09/18/2013    History of present illness LTKA   OT comments  Pt tolerated session well and no panic attacks today. Family present for education. Pt supposed to d/c later today.  Follow Up Recommendations  No OT follow up;Supervision/Assistance - 24 hour    Equipment Recommendations  3 in 1 bedside comode    Recommendations for Other Services      Precautions / Restrictions Precautions Precautions: Knee Precaution Comments: has panic attacks Required Braces or Orthoses: Knee Immobilizer - Left Restrictions Weight Bearing Restrictions: No       Mobility Bed Mobility   Bed Mobility: Supine to Sit     Supine to sit: Min guard        Transfers Overall transfer level: Needs assistance Equipment used: Rolling walker (2 wheeled) Transfers: Sit to/from Stand Sit to Stand: Min guard         General transfer comment: verbal cues for hand placement and LE management.    Balance                                   ADL                           Toilet Transfer: Minimal assistance;Ambulation;BSC;RW   Toileting- Clothing Manipulation and Hygiene: Minimal assistance;Sit to/from stand         General ADL Comments: Demonstrated shower transfer technique for pt and family and they verbalized understanding. Feel pt would benefit from an additional day or two of sponge bathing before showering and pt states she will wait several days or a week before taking a shower and is ok with sponge bathing. Discussed use of 3in1 as shower seat also and how to adjust 3in1 for appropriate height. Reviewed KI wear and how to don/doff. Discussed sequence for LB dressing and sitting down to start clothing over LEs and then stand to pull up with walker in front. Pt states family will help with LB dressing. Pt did well and no panic attacks today. She needed some  cues for walker distance to self and hand placement as she doesnt consistently reach back for armrests of 3in1.       Vision                     Perception     Praxis      Cognition   Behavior During Therapy: [facility] for tasks assessed/performed Overall Cognitive Status: Within Functional Limits for tasks assessed                       Extremity/Trunk Assessment               Exercises     Shoulder Instructions       General Comments      Pertinent Vitals/ Pain       3/10 L knee; reposition, ice  Home Living                                          Prior Functioning/Environment              Frequency Min 2X/week     Progress Toward  Goals  OT Goals(current goals can now be found in the care plan section)  Progress towards OT goals: Progressing toward goals     Plan Discharge plan remains appropriate    Co-evaluation                 End of Session Equipment Utilized During Treatment: Rolling walker;Left knee immobilizer   Activity Tolerance Patient tolerated treatment well   Patient Left in chair;with call bell/phone within reach;with family/visitor present   Nurse Communication          Time: 0950-1020 OT Time Calculation (min): 30 min  Charges: OT General Charges $OT Visit: 1 Procedure OT Treatments $Self Care/Home Management : 8-22 mins $Therapeutic Activity: 8-22 mins  Jules Schick [id number] 09/18/2013, 10:31 AM

## 2013-09-18 NOTE — Discharge Summary (Signed)
Physician Discharge Summary   Patient ID: Leslie Robbins MRN: [id number] DOB/AGE: 60-Feb-1957 60 y.o.  Admit date: 09/16/2013 Discharge date: 09/18/2013  Primary Diagnosis:  Osteoarthritis Left knee(s)  Admission Diagnoses:  Past Medical History  Diagnosis Date  . Depression   . Hypothyroidism   . Hypertension   . Hypercholesteremia   . Arthritis   . GERD (gastroesophageal reflux disease)   . Anxiety    Discharge Diagnoses:   Principal Problem:   OA (osteoarthritis) of knee  Estimated body mass index is 34.66 kg/(m^2) as calculated from the following:   Height as of this encounter: _0  (1.626 m).   Weight as of this encounter: 91.627 kg (202 lb).  Procedure:  Procedure(s) (LRB): LEFT TOTAL KNEE ARTHROPLASTY (Left)   Consults: None  HPI: Leslie Robbins is a 58 y.o. year old female with end stage OA of her left knee with progressively worsening pain and dysfunction. She has constant pain, with activity and at rest and significant functional deficits with difficulties even with ADLs. She has had extensive non-op management including analgesics, injections of cortisone and viscosupplements, and home exercise program, but remains in significant pain with significant dysfunction. Radiographs show bone on bone arthritis medial and patellofemoral. She presents now for left Total Knee Arthroplasty.   Laboratory Data: Admission on 09/16/2013, Discharged on 09/18/2013  Component Date Value Ref Range Status  . ABO/RH(D) 09/16/2013 B POS   Final  . Antibody Screen 09/16/2013 NEG   Final  . Sample Expiration 09/16/2013 09/19/2013   Final  . ABO/RH(D) 09/16/2013 B POS   Final  . WBC 09/17/2013 9.2  4.0 - 10.5 K/uL Final  . RBC 09/17/2013 4.03  3.87 - 5.11 MIL/uL Final  . Hemoglobin 09/17/2013 11.9* 12.0 - 15.0 g/dL Final  . HCT 09/17/2013 36.2  36.0 - 46.0 % Final  . MCV 09/17/2013 89.8  78.0 - 100.0 fL Final  . MCH 09/17/2013 29.5  26.0 - 34.0 pg Final  . MCHC 09/17/2013 32.9  30.0 -  36.0 g/dL Final  . RDW 09/17/2013 13.6  11.5 - 15.5 % Final  . Platelets 09/17/2013 170  150 - 400 K/uL Final  . Sodium 09/17/2013 139  137 - 147 mEq/L Final  . Potassium 09/17/2013 4.0  3.7 - 5.3 mEq/L Final  . Chloride 09/17/2013 101  96 - 112 mEq/L Final  . CO2 09/17/2013 29  19 - 32 mEq/L Final  . Glucose, Bld 09/17/2013 138* 70 - 99 mg/dL Final  . BUN 09/17/2013 18  6 - 23 mg/dL Final  . Creatinine, Ser 09/17/2013 0.93  0.50 - 1.10 mg/dL Final  . Calcium 09/17/2013 9.5  8.4 - 10.5 mg/dL Final  . GFR calc non Af Amer 09/17/2013 65* >90 mL/min Final  . GFR calc Af Amer 09/17/2013 76* >90 mL/min Final   Comment: (NOTE)                          The eGFR has been calculated using the CKD EPI equation.                          This calculation has not been validated in all clinical situations.                          eGFR's persistently <90 mL/min signify possible Chronic Kidney  Disease.  . Anion gap 09/17/2013 9  5 - 15 Final  . WBC 09/18/2013 8.4  4.0 - 10.5 K/uL Final  . RBC 09/18/2013 3.81* 3.87 - 5.11 MIL/uL Final  . Hemoglobin 09/18/2013 11.2* 12.0 - 15.0 g/dL Final  . HCT 09/18/2013 33.8* 36.0 - 46.0 % Final  . MCV 09/18/2013 88.7  78.0 - 100.0 fL Final  . MCH 09/18/2013 29.4  26.0 - 34.0 pg Final  . MCHC 09/18/2013 33.1  30.0 - 36.0 g/dL Final  . RDW 09/18/2013 13.7  11.5 - 15.5 % Final  . Platelets 09/18/2013 178  150 - 400 K/uL Final  . Sodium 09/18/2013 140  137 - 147 mEq/L Final  . Potassium 09/18/2013 4.0  3.7 - 5.3 mEq/L Final  . Chloride 09/18/2013 100  96 - 112 mEq/L Final  . CO2 09/18/2013 30  19 - 32 mEq/L Final  . Glucose, Bld 09/18/2013 117* 70 - 99 mg/dL Final  . BUN 09/18/2013 23  6 - 23 mg/dL Final  . Creatinine, Ser 09/18/2013 0.94  0.50 - 1.10 mg/dL Final  . Calcium 09/18/2013 9.5  8.4 - 10.5 mg/dL Final  . GFR calc non Af Amer 09/18/2013 65* >90 mL/min Final  . GFR calc Af Amer 09/18/2013 75* >90 mL/min Final   Comment: (NOTE)                            The eGFR has been calculated using the CKD EPI equation.                          This calculation has not been validated in all clinical situations.                          eGFR's persistently <90 mL/min signify possible Chronic Kidney                          Disease.  Georgiann Hahn gap 09/18/2013 10  5 - 15 Final  Hospital Outpatient Visit on 09/05/2013  Component Date Value Ref Range Status  . aPTT 09/05/2013 29  24 - 37 seconds Final  . WBC 09/05/2013 4.8  4.0 - 10.5 K/uL Final  . RBC 09/05/2013 4.68  3.87 - 5.11 MIL/uL Final  . Hemoglobin 09/05/2013 13.5  12.0 - 15.0 g/dL Final  . HCT 09/05/2013 42.2  36.0 - 46.0 % Final  . MCV 09/05/2013 90.2  78.0 - 100.0 fL Final  . MCH 09/05/2013 28.8  26.0 - 34.0 pg Final  . MCHC 09/05/2013 32.0  30.0 - 36.0 g/dL Final  . RDW 09/05/2013 14.2  11.5 - 15.5 % Final  . Platelets 09/05/2013 196  150 - 400 K/uL Final  . Sodium 09/05/2013 143  137 - 147 mEq/L Final  . Potassium 09/05/2013 4.3  3.7 - 5.3 mEq/L Final  . Chloride 09/05/2013 104  96 - 112 mEq/L Final  . CO2 09/05/2013 26  19 - 32 mEq/L Final  . Glucose, Bld 09/05/2013 72  70 - 99 mg/dL Final  . BUN 09/05/2013 19  6 - 23 mg/dL Final  . Creatinine, Ser 09/05/2013 1.05  0.50 - 1.10 mg/dL Final  . Calcium 09/05/2013 10.3  8.4 - 10.5 mg/dL Final  . Total Protein 09/05/2013 7.6  6.0 - 8.3 g/dL Final  . Albumin 09/05/2013 3.6  3.5 - 5.2  g/dL Final  . AST 09/05/2013 97* 0 - 37 U/L Final  . ALT 09/05/2013 99* 0 - 35 U/L Final  . Alkaline Phosphatase 09/05/2013 158* 39 - 117 U/L Final  . Total Bilirubin 09/05/2013 0.7  0.3 - 1.2 mg/dL Final  . GFR calc non Af Amer 09/05/2013 57* >90 mL/min Final  . GFR calc Af Amer 09/05/2013 66* >90 mL/min Final   Comment: (NOTE)                          The eGFR has been calculated using the CKD EPI equation.                          This calculation has not been validated in all clinical situations.                          eGFR's  persistently <90 mL/min signify possible Chronic Kidney                          Disease.  . Anion gap 09/05/2013 13  5 - 15 Final  . Prothrombin Time 09/05/2013 12.5  11.6 - 15.2 seconds Final  . INR 09/05/2013 0.93  0.00 - 1.49 Final  . Color, Urine 09/05/2013 YELLOW  YELLOW Final  . APPearance 09/05/2013 CLOUDY* CLEAR Final  . Specific Gravity, Urine 09/05/2013 1.011  1.005 - 1.030 Final  . pH 09/05/2013 5.5  5.0 - 8.0 Final  . Glucose, UA 09/05/2013 NEGATIVE  NEGATIVE mg/dL Final  . Hgb urine dipstick 09/05/2013 NEGATIVE  NEGATIVE Final  . Bilirubin Urine 09/05/2013 NEGATIVE  NEGATIVE Final  . Ketones, ur 09/05/2013 NEGATIVE  NEGATIVE mg/dL Final  . Protein, ur 09/05/2013 NEGATIVE  NEGATIVE mg/dL Final  . Urobilinogen, UA 09/05/2013 0.2  0.0 - 1.0 mg/dL Final  . Nitrite 09/05/2013 NEGATIVE  NEGATIVE Final  . Leukocytes, UA 09/05/2013 SMALL* NEGATIVE Final  . MRSA, PCR 09/05/2013 NEGATIVE  NEGATIVE Final  . Staphylococcus aureus 09/05/2013 NEGATIVE  NEGATIVE Final   Comment:                                 The Xpert SA Assay (FDA                          approved for NASAL specimens                          in patients over 31 years of age),                          is one component of                          a comprehensive surveillance                          program.  Test performance has                          been validated by [facility] for patients greater                          than or equal to 74 year old.                          It is not intended                          to diagnose infection nor to                          guide or monitor treatment.  . Squamous Epithelial / LPF 09/05/2013 RARE  RARE Final  . WBC, UA 09/05/2013 3-6  <3 WBC/hpf Final  . RBC / HPF 09/05/2013 0-2  <3 RBC/hpf Final  . Bacteria, UA 09/05/2013 FEW* RARE Final  . Casts 09/05/2013 HYALINE CASTS* NEGATIVE Final  . Urine-Other 09/05/2013 MUCOUS PRESENT    Final     X-Rays:No results found.  EKG: Orders placed in visit on 07/11/13  . EKG 12-LEAD     Hospital Course: Leslie Robbins is a 58 y.o. who was admitted to [facility]. They were brought to the operating room on 09/16/2013 and underwent Procedure(s): LEFT TOTAL KNEE ARTHROPLASTY.  Patient tolerated the procedure well and was later transferred to the recovery room and then to the orthopaedic floor for postoperative care.  They were given PO and IV analgesics for pain control following their surgery.  They were given 24 hours of postoperative antibiotics of  Anti-infectives   Start     Dose/Rate Route Frequency Ordered Stop   09/16/13 1900  ceFAZolin (ANCEF) IVPB 2 g/50 mL premix     2 g 100 mL/hr over 30 Minutes Intravenous Every 6 hours 09/16/13 1549 09/17/13 0214   09/16/13 1031  ceFAZolin (ANCEF) IVPB 2 g/50 mL premix     2 g 100 mL/hr over 30 Minutes Intravenous On call to O.R. 09/16/13 1031 09/16/13 1252     and started on DVT prophylaxis in the form of Xarelto.   PT and OT were ordered for total joint protocol.  Discharge planning consulted to help with postop disposition and equipment needs.  Patient had a tough night on the evening of surgery with moderate to severe pain.  They started to get up OOB with therapy on day one since pain was a little better. Hemovac drain was pulled without difficulty.  Continued to work with therapy into day two.  Dressing was changed on day two and the incision was healing well.  Patient was seen in rounds and was ready to go home later on day two following therapy.  Discharge home with home health  Diet - Cardiac diet  Follow up - in two weeks  Activity - WBAT  Disposition - Home  Condition Upon Discharge - Good  D/C Meds - See DC Summary  DVT Prophylaxis - Xarelto        Discharge Instructions   Call MD / Call 911    Complete by:  As directed   If you experience chest pain or shortness of breath, CALL 911 and be transported to  the hospital emergency room.  If you develope a fever above 101 F, pus (white drainage) or increased drainage or redness at the wound, or calf pain, call your surgeon's office.     Change  dressing    Complete by:  As directed   Change dressing daily with sterile 4 x 4 inch gauze dressing and apply TED hose. Do not submerge the incision under water.     Constipation Prevention    Complete by:  As directed   Drink plenty of fluids.  Prune juice may be helpful.  You may use a stool softener, such as Colace (over the counter) 100 mg twice a day.  Use MiraLax (over the counter) for constipation as needed.     Diet - low sodium heart healthy    Complete by:  As directed      Discharge instructions    Complete by:  As directed   Pick up stool softner and laxative for home. Do not submerge incision under water. May shower. Continue to use ice for pain and swelling from surgery. Take Xarelto for two and a half more weeks, then discontinue Xarelto. Once the patient has completed the blood thinner regimen, then take a Baby 81 mg Aspirin daily for three more weeks.     Do not put a pillow under the knee. Place it under the heel.    Complete by:  As directed      Do not sit on low chairs, stoools or toilet seats, as it may be difficult to get up from low surfaces    Complete by:  As directed      Driving restrictions    Complete by:  As directed   No driving until released by the physician.     Increase activity slowly as tolerated    Complete by:  As directed      Lifting restrictions    Complete by:  As directed   No lifting until released by the physician.     Patient may shower    Complete by:  As directed   You may shower without a dressing once there is no drainage.  Do not wash over the wound.  If drainage remains, do not shower until drainage stops.     TED hose    Complete by:  As directed   Use stockings (TED hose) for 3 weeks on both leg(s).  You may remove them at night for sleeping.      Weight bearing as tolerated    Complete by:  As directed             Medication List    STOP taking these medications       meloxicam 15 MG tablet  Commonly known as:  MOBIC      TAKE these medications       atorvastatin 40 MG tablet  Commonly known as:  LIPITOR  Take 40 mg by mouth daily at 6 PM.     benazepril-hydrochlorthiazide 20-25 MG per tablet  Commonly known as:  LOTENSIN HCT  Take 1 tablet by mouth every morning.     clorazepate 3.75 MG tablet  Commonly known as:  TRANXENE  Take 3.75 mg by mouth 2 (two) times daily.     escitalopram 20 MG tablet  Commonly known as:  LEXAPRO  Take 20 mg by mouth every morning.     levothyroxine 137 MCG tablet  Commonly known as:  SYNTHROID, LEVOTHROID  Take 137 mcg by mouth daily before breakfast.     methocarbamol 500 MG tablet  Commonly known as:  ROBAXIN  Take 1 tablet (500 mg total) by mouth every 6 (six) hours as needed for muscle spasms.  omeprazole 40 MG capsule  Commonly known as:  PRILOSEC  Take 40 mg by mouth every evening.     oxyCODONE 5 MG immediate release tablet  Commonly known as:  Oxy IR/ROXICODONE  Take 1-2 tablets (5-10 mg total) by mouth every 3 (three) hours as needed for moderate pain, severe pain or breakthrough pain.     rivaroxaban 10 MG Tabs tablet  Commonly known as:  XARELTO  - Take 1 tablet (10 mg total) by mouth daily with breakfast. Take Xarelto for two and a half more weeks, then discontinue Xarelto.  - Once the patient has completed the blood thinner regimen, then take a Baby 81 mg Aspirin daily for three more weeks.       Follow-up Information   Follow up with Gearlean Alf, MD. Schedule an appointment as soon as possible for a visit in 2 weeks.   Specialty:  Orthopedic Surgery   Contact information:   [address] [place] [address] [phone]       Signed: Arlee Muslim, PA-C Orthopaedic Surgery 09/24/2013, 10:22 AM

## 2013-09-18 NOTE — Progress Notes (Signed)
Physical Therapy Treatment Patient Details Name: NEVAEHA FINERTY MRN: [id number] DOB: Nov 24, 1955 Today's Date: 09/18/2013    History of Present Illness LTKA    PT Comments    Pt tolerated well, ready to DC  Follow Up Recommendations        Equipment Recommendations       Recommendations for Other Services       Precautions / Restrictions Precautions Precautions: Knee Precaution Comments: has panic attacks Required Braces or Orthoses: Knee Immobilizer - Left Restrictions Weight Bearing Restrictions: No    Mobility  Bed Mobility                  Transfers Overall transfer level: Needs assistance Equipment used: Rolling walker (2 wheeled) Transfers: Sit to/from Stand Sit to Stand: Min guard         General transfer comment: verbal cues for hand placement and LE management.  Ambulation/Gait Ambulation/Gait assistance: Min guard Ambulation Distance (Feet): 50 Feet Assistive device: Rolling walker (2 wheeled)           Stairs Stairs: Yes Stairs assistance: Min assist Stair Management: No rails;Step to pattern;Backwards;Forwards;With walker Number of Stairs: 1 General stair comments: too much pain forward so instructed in backward.  Wheelchair Mobility    Modified Rankin (Stroke Patients Only)       Balance                                    Cognition Arousal/Alertness: Awake/alert Behavior During Therapy: WFL for tasks assessed/performed Overall Cognitive Status: Within Functional Limits for tasks assessed                      Exercises Total Joint Exercises Quad Sets: AROM;Both;10 reps;Supine Short Arc Quad: AROM;Left;10 reps;Supine Heel Slides: AAROM;Left;10 reps;Supine Hip ABduction/ADduction: AROM;Left;10 reps;Supine Straight Leg Raises: AAROM;Left;10 reps;Supine Goniometric ROM: 10-50    General Comments        Pertinent Vitals/Pain < 3 l knee    Home Living                      Prior  Function            PT Goals (current goals can now be found in the care plan section) Progress towards PT goals: Progressing toward goals    Frequency       PT Plan      Co-evaluation             End of Session           Time: 2774-1287 PT Time Calculation (min): 32 min  Charges:  $Gait Training: 8-22 mins $Therapeutic Exercise: 8-22 mins                    G Codes:      Claretha Cooper 09/18/2013, 1:48 PM

## 2013-09-20 ENCOUNTER — Telehealth: Payer: Self-pay | Admitting: *Deleted

## 2013-09-20 ENCOUNTER — Telehealth: Payer: Self-pay | Admitting: Nurse Practitioner

## 2013-09-20 NOTE — Telephone Encounter (Signed)
Already took care of 

## 2013-09-20 NOTE — Telephone Encounter (Signed)
Pt feels anxious with standing, slightly dizzy, and a little SOb with standing. Per therapist her BP have ran 90's/50's.  Per MMM_   Cut Lotensin in half x 1 week and continue to keep up with readings. Increase fluid intake and call us in a week with reading and an update.

## 2013-09-29 ENCOUNTER — Other Ambulatory Visit: Payer: Self-pay | Admitting: Nurse Practitioner

## 2013-10-09 ENCOUNTER — Ambulatory Visit: Payer: BC Managed Care – PPO | Attending: Orthopedic Surgery | Admitting: Physical Therapy

## 2013-10-09 DIAGNOSIS — M25669 Stiffness of unspecified knee, not elsewhere classified: Secondary | ICD-10-CM | POA: Insufficient documentation

## 2013-10-09 DIAGNOSIS — R269 Unspecified abnormalities of gait and mobility: Secondary | ICD-10-CM | POA: Insufficient documentation

## 2013-10-09 DIAGNOSIS — M25569 Pain in unspecified knee: Secondary | ICD-10-CM | POA: Diagnosis not present

## 2013-10-09 DIAGNOSIS — R5381 Other malaise: Secondary | ICD-10-CM | POA: Insufficient documentation

## 2013-10-09 DIAGNOSIS — IMO0001 Reserved for inherently not codable concepts without codable children: Secondary | ICD-10-CM | POA: Diagnosis not present

## 2013-10-10 ENCOUNTER — Ambulatory Visit: Payer: BC Managed Care – PPO | Admitting: Physical Therapy

## 2013-10-10 DIAGNOSIS — IMO0001 Reserved for inherently not codable concepts without codable children: Secondary | ICD-10-CM | POA: Diagnosis not present

## 2013-10-14 ENCOUNTER — Ambulatory Visit: Payer: BC Managed Care – PPO | Admitting: Physical Therapy

## 2013-10-14 DIAGNOSIS — IMO0001 Reserved for inherently not codable concepts without codable children: Secondary | ICD-10-CM | POA: Diagnosis not present

## 2013-10-16 ENCOUNTER — Ambulatory Visit: Payer: BC Managed Care – PPO | Admitting: Physical Therapy

## 2013-10-16 DIAGNOSIS — IMO0001 Reserved for inherently not codable concepts without codable children: Secondary | ICD-10-CM | POA: Diagnosis not present

## 2013-10-18 ENCOUNTER — Ambulatory Visit: Payer: BC Managed Care – PPO | Admitting: Physical Therapy

## 2013-10-18 DIAGNOSIS — IMO0001 Reserved for inherently not codable concepts without codable children: Secondary | ICD-10-CM | POA: Diagnosis not present

## 2013-10-22 ENCOUNTER — Ambulatory Visit: Payer: BC Managed Care – PPO | Admitting: Physical Therapy

## 2013-10-22 DIAGNOSIS — IMO0001 Reserved for inherently not codable concepts without codable children: Secondary | ICD-10-CM | POA: Diagnosis not present

## 2013-10-24 ENCOUNTER — Ambulatory Visit: Payer: BC Managed Care – PPO | Admitting: Physical Therapy

## 2013-10-24 DIAGNOSIS — IMO0001 Reserved for inherently not codable concepts without codable children: Secondary | ICD-10-CM | POA: Diagnosis not present

## 2013-10-25 ENCOUNTER — Ambulatory Visit: Payer: BC Managed Care – PPO | Admitting: *Deleted

## 2013-10-25 DIAGNOSIS — IMO0001 Reserved for inherently not codable concepts without codable children: Secondary | ICD-10-CM | POA: Diagnosis not present

## 2013-10-29 ENCOUNTER — Ambulatory Visit: Payer: BC Managed Care – PPO | Admitting: Physical Therapy

## 2013-10-29 DIAGNOSIS — IMO0001 Reserved for inherently not codable concepts without codable children: Secondary | ICD-10-CM | POA: Diagnosis not present

## 2013-10-31 ENCOUNTER — Ambulatory Visit (INDEPENDENT_AMBULATORY_CARE_PROVIDER_SITE_OTHER): Payer: BC Managed Care – PPO | Admitting: Nurse Practitioner

## 2013-10-31 ENCOUNTER — Ambulatory Visit: Payer: BC Managed Care – PPO | Admitting: Physical Therapy

## 2013-10-31 ENCOUNTER — Encounter: Payer: Self-pay | Admitting: Nurse Practitioner

## 2013-10-31 VITALS — BP 139/96 | HR 81 | Temp 97.0°F | Ht 64.0 in | Wt 203.0 lb

## 2013-10-31 DIAGNOSIS — R300 Dysuria: Secondary | ICD-10-CM

## 2013-10-31 DIAGNOSIS — N3000 Acute cystitis without hematuria: Secondary | ICD-10-CM

## 2013-10-31 DIAGNOSIS — I10 Essential (primary) hypertension: Secondary | ICD-10-CM

## 2013-10-31 DIAGNOSIS — IMO0001 Reserved for inherently not codable concepts without codable children: Secondary | ICD-10-CM | POA: Diagnosis not present

## 2013-10-31 LAB — POCT UA - MICROSCOPIC ONLY
CRYSTALS, UR, HPF, POC: NEGATIVE
Casts, Ur, LPF, POC: NEGATIVE
Mucus, UA: NEGATIVE
Yeast, UA: NEGATIVE

## 2013-10-31 LAB — POCT URINALYSIS DIPSTICK
Bilirubin, UA: NEGATIVE
Glucose, UA: NEGATIVE
Ketones, UA: NEGATIVE
NITRITE UA: NEGATIVE
PROTEIN UA: NEGATIVE
Spec Grav, UA: 1.01
Urobilinogen, UA: NEGATIVE
pH, UA: 5

## 2013-10-31 MED ORDER — AMLODIPINE BESYLATE 2.5 MG PO TABS: 2.5000 mg | ORAL_TABLET | Freq: Every day | ORAL | Status: DC

## 2013-10-31 MED ORDER — CIPROFLOXACIN HCL 500 MG PO TABS: 500.0000 mg | ORAL_TABLET | Freq: Two times a day (BID) | ORAL | Status: DC

## 2013-10-31 NOTE — Progress Notes (Signed)
   Subjective:    Patient ID: Leslie Robbins, female    DOB: [birth year], 58 y.o.   MRN: [id number]  HPI Having elevated blood pressure for 2 weeks and currently back on Lotension/HCTZ.  Had a left total knee surgery on July 13-2015 and was having episodes of hypotension and was told to hold Lotension until blood pressure got elevated.  Blood pressure readings were 100/40's.  Since restarting the medication blood pressure has been reading as high as 150/104.  Has had dysuria, supra pubic and mid abdominal pain for a month that is intermittent.   Review of Systems  Constitutional: Negative.   Respiratory: Negative.   Cardiovascular: Negative.   Gastrointestinal: Positive for abdominal pain.  Genitourinary: Positive for dysuria and frequency.  Skin: Negative.        Objective:   Physical Exam  Constitutional: She is oriented to person, place, and time. She appears well-developed and well-nourished.  Cardiovascular: Normal rate, regular rhythm and normal heart sounds.   Pulmonary/Chest: Effort normal and breath sounds normal.  Abdominal: Soft. Bowel sounds are normal.  Genitourinary:  NO CVA tendernes  Neurological: She is alert and oriented to person, place, and time.  Skin: Skin is warm and dry.  Psychiatric: She has a normal mood and affect. Her behavior is normal. Judgment and thought content normal.   BP 139/96  Pulse 81  Temp(Src) 97 F (36.1 C) (Oral)  Ht 5\' 4"  (1.626 m)  Wt 203 lb (92.08 kg)  BMI 34.83 kg/m2  Results for orders placed in visit on 10/31/13  POCT URINALYSIS DIPSTICK      Result Value Ref Range   Color, UA gold     Clarity, UA clear     Glucose, UA neg     Bilirubin, UA neg     Ketones, UA neg     Spec Grav, UA 1.010     Blood, UA mod     pH, UA 5.0     Protein, UA neg     Urobilinogen, UA negative     Nitrite, UA neg     Leukocytes, UA large (3+)           Assessment & Plan:  1. Essential hypertension, benign Avoid salt in diet Continue  diary of blood pressure Keep follow up appointment in October - amLODipine (NORVASC) 2.5 MG tablet; Take 1 tablet (2.5 mg total) by mouth daily.  Dispense: 90 tablet; Refill: 1  2. Dysuria - POCT UA - Microscopic Only - POCT urinalysis dipstick  3. Acute cystitis without hematuria Force fluids AZO over the counter X2 days RTO prn Culture pending - ciprofloxacin (CIPRO) 500 MG tablet; Take 1 tablet (500 mg total) by mouth 2 (two) times daily.  Dispense: 6 tablet; Refill: 0  Mary-Margaret Hassell Done, FNP

## 2013-10-31 NOTE — Patient Instructions (Signed)

## 2013-11-04 LAB — URINE CULTURE

## 2013-11-05 ENCOUNTER — Ambulatory Visit: Payer: BC Managed Care – PPO | Attending: Orthopedic Surgery | Admitting: Physical Therapy

## 2013-11-05 DIAGNOSIS — R269 Unspecified abnormalities of gait and mobility: Secondary | ICD-10-CM | POA: Insufficient documentation

## 2013-11-05 DIAGNOSIS — M25669 Stiffness of unspecified knee, not elsewhere classified: Secondary | ICD-10-CM | POA: Insufficient documentation

## 2013-11-05 DIAGNOSIS — R5381 Other malaise: Secondary | ICD-10-CM | POA: Insufficient documentation

## 2013-11-05 DIAGNOSIS — IMO0001 Reserved for inherently not codable concepts without codable children: Secondary | ICD-10-CM | POA: Insufficient documentation

## 2013-11-05 DIAGNOSIS — M25569 Pain in unspecified knee: Secondary | ICD-10-CM | POA: Diagnosis not present

## 2013-11-07 ENCOUNTER — Ambulatory Visit: Payer: BC Managed Care – PPO | Admitting: Physical Therapy

## 2013-11-07 DIAGNOSIS — IMO0001 Reserved for inherently not codable concepts without codable children: Secondary | ICD-10-CM | POA: Diagnosis not present

## 2013-11-11 ENCOUNTER — Other Ambulatory Visit: Payer: Self-pay | Admitting: Nurse Practitioner

## 2013-11-12 ENCOUNTER — Ambulatory Visit: Payer: BC Managed Care – PPO | Admitting: *Deleted

## 2013-11-12 DIAGNOSIS — IMO0001 Reserved for inherently not codable concepts without codable children: Secondary | ICD-10-CM | POA: Diagnosis not present

## 2013-11-12 NOTE — Telephone Encounter (Signed)
Please call in clorazepate with 1 refills  

## 2013-11-12 NOTE — Telephone Encounter (Signed)
rx called into pharmacy

## 2013-11-12 NOTE — Telephone Encounter (Signed)
Last refill 09/15/13. Last ov 10/31/13. If approved call to [facility].

## 2013-11-14 ENCOUNTER — Ambulatory Visit: Payer: BC Managed Care – PPO | Admitting: Physical Therapy

## 2013-11-14 DIAGNOSIS — IMO0001 Reserved for inherently not codable concepts without codable children: Secondary | ICD-10-CM | POA: Diagnosis not present

## 2013-11-19 ENCOUNTER — Ambulatory Visit: Payer: BC Managed Care – PPO | Admitting: Physical Therapy

## 2013-11-19 DIAGNOSIS — IMO0001 Reserved for inherently not codable concepts without codable children: Secondary | ICD-10-CM | POA: Diagnosis not present

## 2013-11-21 ENCOUNTER — Ambulatory Visit: Payer: BC Managed Care – PPO | Admitting: Physical Therapy

## 2013-11-21 ENCOUNTER — Other Ambulatory Visit: Payer: Self-pay | Admitting: Nurse Practitioner

## 2013-11-21 DIAGNOSIS — IMO0001 Reserved for inherently not codable concepts without codable children: Secondary | ICD-10-CM | POA: Diagnosis not present

## 2013-12-30 ENCOUNTER — Encounter: Payer: Self-pay | Admitting: Nurse Practitioner

## 2013-12-30 ENCOUNTER — Ambulatory Visit (INDEPENDENT_AMBULATORY_CARE_PROVIDER_SITE_OTHER): Payer: BC Managed Care – PPO | Admitting: Nurse Practitioner

## 2013-12-30 VITALS — BP 106/76 | HR 74 | Temp 97.8°F | Ht 64.0 in | Wt 205.8 lb

## 2013-12-30 DIAGNOSIS — Z23 Encounter for immunization: Secondary | ICD-10-CM

## 2013-12-30 DIAGNOSIS — K219 Gastro-esophageal reflux disease without esophagitis: Secondary | ICD-10-CM

## 2013-12-30 DIAGNOSIS — N904 Leukoplakia of vulva: Secondary | ICD-10-CM

## 2013-12-30 DIAGNOSIS — E038 Other specified hypothyroidism: Secondary | ICD-10-CM

## 2013-12-30 DIAGNOSIS — F411 Generalized anxiety disorder: Secondary | ICD-10-CM

## 2013-12-30 DIAGNOSIS — I10 Essential (primary) hypertension: Secondary | ICD-10-CM

## 2013-12-30 DIAGNOSIS — F418 Other specified anxiety disorders: Secondary | ICD-10-CM

## 2013-12-30 DIAGNOSIS — E785 Hyperlipidemia, unspecified: Secondary | ICD-10-CM

## 2013-12-30 DIAGNOSIS — Z01419 Encounter for gynecological examination (general) (routine) without abnormal findings: Secondary | ICD-10-CM

## 2013-12-30 DIAGNOSIS — Z0000 Encounter for general adult medical examination without abnormal findings: Secondary | ICD-10-CM

## 2013-12-30 LAB — POCT UA - MICROSCOPIC ONLY
Bacteria, U Microscopic: NEGATIVE
CRYSTALS, UR, HPF, POC: NEGATIVE
Casts, Ur, LPF, POC: NEGATIVE
Mucus, UA: NEGATIVE
RBC, URINE, MICROSCOPIC: NEGATIVE
WBC, Ur, HPF, POC: NEGATIVE
Yeast, UA: NEGATIVE

## 2013-12-30 LAB — POCT URINALYSIS DIPSTICK
Bilirubin, UA: NEGATIVE
Blood, UA: NEGATIVE
GLUCOSE UA: NEGATIVE
Ketones, UA: NEGATIVE
LEUKOCYTES UA: NEGATIVE
Nitrite, UA: NEGATIVE
PROTEIN UA: NEGATIVE
Spec Grav, UA: 1.015
UROBILINOGEN UA: NEGATIVE
pH, UA: 7

## 2013-12-30 LAB — POCT CBC
Granulocyte percent: 64.6 %G (ref 37–80)
HCT, POC: 41.1 % (ref 37.7–47.9)
Hemoglobin: 13.1 g/dL (ref 12.2–16.2)
LYMPH, POC: 1.8 (ref 0.6–3.4)
MCH: 26.1 pg — AB (ref 27–31.2)
MCHC: 31.9 g/dL (ref 31.8–35.4)
MCV: 81.9 fL (ref 80–97)
MPV: 8.7 fL (ref 0–99.8)
PLATELET COUNT, POC: 233 10*3/uL (ref 142–424)
POC Granulocyte: 3.8 (ref 2–6.9)
POC LYMPH %: 30.2 % (ref 10–50)
RBC: 5 M/uL (ref 4.04–5.48)
RDW, POC: 15.5 %
WBC: 5.9 10*3/uL (ref 4.6–10.2)

## 2013-12-30 NOTE — Patient Instructions (Signed)

## 2013-12-30 NOTE — Progress Notes (Signed)
Subjective:    Patient ID: Leslie Robbins, female    DOB: Sep 27, 1955, 58 y.o.   MRN: [id number]   Patient in today for annual physical exam with a PAP- She is doing well, no acute complaint.   Hypertension This is a chronic problem. The current episode started more than 1 year ago. The problem is unchanged. The problem is controlled. Associated symptoms include anxiety. Pertinent negatives include no blurred vision, chest pain, neck pain, palpitations, peripheral edema or shortness of breath. There are no associated agents to hypertension. Risk factors for coronary artery disease include dyslipidemia, family history, obesity and post-menopausal state. Past treatments include ACE inhibitors and diuretics. The current treatment provides moderate improvement. Compliance problems include diet and exercise.  Hypertensive end-organ damage includes a thyroid problem.  Hyperlipidemia This is a chronic problem. The current episode started more than 1 year ago. The problem is controlled. Recent lipid tests were reviewed and are normal. Exacerbating diseases include hypothyroidism and obesity. She has no history of diabetes. Factors aggravating her hyperlipidemia include thiazides. Pertinent negatives include no chest pain, leg pain or shortness of breath. Current antihyperlipidemic treatment includes statins. The current treatment provides moderate improvement of lipids. Compliance problems include adherence to diet and adherence to exercise.  Risk factors for coronary artery disease include family history, hypertension, obesity and post-menopausal.  Thyroid Problem Presents for follow-up (hypothyroidism) visit. Patient reports no anxiety, depressed mood, diaphoresis, diarrhea, dry skin, fatigue, heat intolerance, menstrual problem, palpitations, visual change, weight gain or weight loss. The symptoms have been stable. Her past medical history is significant for hyperlipidemia. There is no history of diabetes.   Depression/GAD Currently on lexapro daily and clorazepate Qd-BID- Patient doing well right now- says that she has had no recent anxiety attacks. GERD prilosec working well to keep symptoms unde rcontrol      *patient reports a left total knee replacement, she is doing well.   Review of Systems  Constitutional: Negative for weight loss, weight gain, diaphoresis and fatigue.  Eyes: Negative for blurred vision.  Respiratory: Negative for shortness of breath.   Cardiovascular: Negative for chest pain and palpitations.  Gastrointestinal: Negative for diarrhea.  Endocrine: Negative for heat intolerance.  Genitourinary: Negative for menstrual problem.  Musculoskeletal: Negative for neck pain.  All other systems reviewed and are negative.      Objective:   Physical Exam  Constitutional: She is oriented to person, place, and time. She appears well-developed and well-nourished.  HENT:  Head: Normocephalic.  Right Ear: Hearing, tympanic membrane, external ear and ear canal normal.  Left Ear: Hearing, tympanic membrane, external ear and ear canal normal.  Nose: Nose normal.  Mouth/Throat: Uvula is midline, oropharynx is clear and moist and mucous membranes are normal.  Eyes: Conjunctivae and EOM are normal. Pupils are equal, round, and reactive to light.  Neck: Trachea normal, normal range of motion and full passive range of motion without pain. Neck supple. No JVD present. Carotid bruit is not present. No mass and no thyromegaly present.  Cardiovascular: Normal rate, regular rhythm, normal heart sounds and intact distal pulses.  Exam reveals no gallop and no friction rub.   No murmur heard. Pulmonary/Chest: Effort normal and breath sounds normal. She has no wheezes. She has no rales. Right breast exhibits no inverted nipple, no mass, no nipple discharge, no skin change and no tenderness. Left breast exhibits no inverted nipple, no mass, no nipple discharge, no skin change and no  tenderness.  Abdominal: Soft. Bowel sounds are  normal. She exhibits no distension and no mass. There is no tenderness.  Genitourinary: Vagina normal and uterus normal. No breast swelling, tenderness, discharge or bleeding.  bimanual exam-No adnexal masses or tenderness. Vaginal cuff intact Lichen sclerus of vulva and perineum  Musculoskeletal: Normal range of motion.  Lymphadenopathy:    She has no cervical adenopathy.  Neurological: She is alert and oriented to person, place, and time. She has normal reflexes.  Skin: Skin is warm and dry.  Psychiatric: She has a normal mood and affect. Her behavior is normal. Judgment and thought content normal.    BP 106/76  Pulse 74  Temp(Src) 97.8 F (36.6 C) (Oral)  Ht 5' 4" (1.626 m)  Wt 205 lb 12.8 oz (93.35 kg)  BMI 35.31 kg/m2        Assessment & Plan:   1. Annual physical exam   2. Encounter for routine gynecological examination   3. Encounter for immunization   4. Essential hypertension   5. Depression with anxiety   6. Hyperlipidemia with target LDL less than 100   7. Gastroesophageal reflux disease without esophagitis   8. Other specified hypothyroidism   9. GAD (generalized anxiety disorder)   10. Lichen sclerosus of female genitalia    Orders Placed This Encounter  Procedures  . CMP14+EGFR  . NMR, lipoprofile  . Thyroid Panel With TSH  . Vit D  25 hydroxy (rtn osteoporosis monitoring)  . POCT UA - Microscopic Only  . POCT urinalysis dipstick  . POCT CBC   Meds ordered this encounter  Medications  . Red Yeast Rice 600 MG CAPS    Sig: Take by mouth.   Health maintenance reviewed Labs ending Low fat diet and exercise encouraged RTO in 3 months follow up  Mary-Margaret , FNP  

## 2013-12-31 ENCOUNTER — Other Ambulatory Visit: Payer: Self-pay | Admitting: Nurse Practitioner

## 2013-12-31 LAB — THYROID PANEL WITH TSH
Free Thyroxine Index: 3.8 (ref 1.2–4.9)
T3 UPTAKE RATIO: 30 % (ref 24–39)
T4 TOTAL: 12.6 ug/dL — AB (ref 4.5–12.0)
TSH: 0.023 u[IU]/mL — ABNORMAL LOW (ref 0.450–4.500)

## 2013-12-31 LAB — CMP14+EGFR
ALBUMIN: 4.2 g/dL (ref 3.6–4.8)
ALT: 15 IU/L (ref 0–32)
AST: 22 IU/L (ref 0–40)
Albumin/Globulin Ratio: 1.4 (ref 1.1–2.5)
Alkaline Phosphatase: 132 IU/L — ABNORMAL HIGH (ref 39–117)
BUN/Creatinine Ratio: 20 (ref 11–26)
BUN: 20 mg/dL (ref 8–27)
CALCIUM: 10 mg/dL (ref 8.7–10.3)
CO2: 26 mmol/L (ref 18–29)
Chloride: 101 mmol/L (ref 97–108)
Creatinine, Ser: 0.98 mg/dL (ref 0.57–1.00)
GFR calc Af Amer: 73 mL/min/{1.73_m2} (ref >59)
GFR, EST NON AFRICAN AMERICAN: 63 mL/min/{1.73_m2} (ref >59)
GLUCOSE: 78 mg/dL (ref 65–99)
Globulin, Total: 3.1 g/dL (ref 1.5–4.5)
POTASSIUM: 4.3 mmol/L (ref 3.5–5.2)
Sodium: 141 mmol/L (ref 134–144)
Total Bilirubin: 0.4 mg/dL (ref 0.0–1.2)
Total Protein: 7.3 g/dL (ref 6.0–8.5)

## 2013-12-31 LAB — VITAMIN D 25 HYDROXY (VIT D DEFICIENCY, FRACTURES): Vit D, 25-Hydroxy: 28.3 ng/mL — ABNORMAL LOW (ref 30.0–100.0)

## 2013-12-31 LAB — NMR, LIPOPROFILE
Cholesterol: 196 mg/dL (ref 100–199)
HDL Cholesterol by NMR: 45 mg/dL (ref >39)
HDL Particle Number: 25.6 umol/L — ABNORMAL LOW (ref >=30.5)
LDL PARTICLE NUMBER: 1659 nmol/L — AB (ref <1000)
LDL Size: 21 nm (ref >20.5)
LDL-C: 123 mg/dL — ABNORMAL HIGH (ref 0–99)
LP-IR Score: 50 — ABNORMAL HIGH (ref <=45)
SMALL LDL PARTICLE NUMBER: 674 nmol/L — AB (ref <=527)
TRIGLYCERIDES BY NMR: 138 mg/dL (ref 0–149)

## 2013-12-31 MED ORDER — LEVOTHYROXINE SODIUM 125 MCG PO TABS: 125.0000 ug | ORAL_TABLET | Freq: Every day | ORAL | Status: DC

## 2014-01-03 LAB — PAP IG W/ RFLX HPV ASCU: PAP Smear Comment: 0

## 2014-01-03 LAB — HPV, LOW VOLUME (REFLEX)

## 2014-01-03 LAB — HPV DNA PROBE HIGH RISK, AMPLIFIED

## 2014-01-10 ENCOUNTER — Other Ambulatory Visit: Payer: Self-pay | Admitting: Nurse Practitioner

## 2014-01-10 NOTE — Telephone Encounter (Signed)
Please call in clorazepate 3.75 1 po BID #[id number]  with 0 refills

## 2014-01-10 NOTE — Telephone Encounter (Signed)
Last seen 12/30/13 MMM  

## 2014-01-11 NOTE — Telephone Encounter (Signed)
rx called into pharmacy

## 2014-01-13 ENCOUNTER — Telehealth: Payer: Self-pay | Admitting: *Deleted

## 2014-01-13 NOTE — Telephone Encounter (Signed)
Tranxene called to [facility].

## 2014-01-17 ENCOUNTER — Other Ambulatory Visit: Payer: Self-pay | Admitting: Nurse Practitioner

## 2014-02-09 ENCOUNTER — Other Ambulatory Visit: Payer: Self-pay | Admitting: Nurse Practitioner

## 2014-02-10 NOTE — Telephone Encounter (Signed)
I do not see where Lotensin HCT 20/25 has been prescribed before

## 2014-02-15 ENCOUNTER — Other Ambulatory Visit: Payer: Self-pay | Admitting: Nurse Practitioner

## 2014-02-23 ENCOUNTER — Other Ambulatory Visit: Payer: Self-pay | Admitting: Nurse Practitioner

## 2014-02-24 NOTE — Telephone Encounter (Signed)
Please call in cloraxepate with o refills

## 2014-02-24 NOTE — Telephone Encounter (Signed)
Last seen 12/30/13 MMM

## 2014-02-25 ENCOUNTER — Telehealth: Payer: Self-pay | Admitting: *Deleted

## 2014-02-25 NOTE — Telephone Encounter (Signed)
Script for tranxene called in.

## 2014-02-26 ENCOUNTER — Other Ambulatory Visit: Payer: Self-pay | Admitting: Nurse Practitioner

## 2014-03-17 ENCOUNTER — Other Ambulatory Visit: Payer: Self-pay | Admitting: Nurse Practitioner

## 2014-04-18 ENCOUNTER — Ambulatory Visit (INDEPENDENT_AMBULATORY_CARE_PROVIDER_SITE_OTHER): Payer: BLUE CROSS/BLUE SHIELD

## 2014-04-18 ENCOUNTER — Ambulatory Visit (INDEPENDENT_AMBULATORY_CARE_PROVIDER_SITE_OTHER): Payer: BLUE CROSS/BLUE SHIELD | Admitting: Family Medicine

## 2014-04-18 ENCOUNTER — Encounter: Payer: Self-pay | Admitting: Family Medicine

## 2014-04-18 VITALS — BP 109/74 | HR 62 | Temp 97.7°F | Ht 64.0 in | Wt 200.6 lb

## 2014-04-18 DIAGNOSIS — M25551 Pain in right hip: Secondary | ICD-10-CM

## 2014-04-18 DIAGNOSIS — E785 Hyperlipidemia, unspecified: Secondary | ICD-10-CM

## 2014-04-18 DIAGNOSIS — I10 Essential (primary) hypertension: Secondary | ICD-10-CM

## 2014-04-18 DIAGNOSIS — M1610 Unilateral primary osteoarthritis, unspecified hip: Secondary | ICD-10-CM

## 2014-04-18 DIAGNOSIS — E038 Other specified hypothyroidism: Secondary | ICD-10-CM

## 2014-04-18 DIAGNOSIS — M1990 Unspecified osteoarthritis, unspecified site: Secondary | ICD-10-CM

## 2014-04-18 LAB — POCT CBC
Granulocyte percent: 56.4 %G (ref 37–80)
HEMATOCRIT: 41.7 % (ref 37.7–47.9)
HEMOGLOBIN: 12.5 g/dL (ref 12.2–16.2)
Lymph, poc: 2.1 (ref 0.6–3.4)
MCH, POC: 25.5 pg — AB (ref 27–31.2)
MCHC: 30 g/dL — AB (ref 31.8–35.4)
MCV: 85 fL (ref 80–97)
MPV: 8.4 fL (ref 0–99.8)
POC GRANULOCYTE: 3.3 (ref 2–6.9)
POC LYMPH %: 35.7 % (ref 10–50)
Platelet Count, POC: 209 10*3/uL (ref 142–424)
RBC: 4.9 M/uL (ref 4.04–5.48)
RDW, POC: 14.6 %
WBC: 5.9 10*3/uL (ref 4.6–10.2)

## 2014-04-18 MED ORDER — MELOXICAM 15 MG PO TABS: 15.0000 mg | ORAL_TABLET | Freq: Every day | ORAL | Status: DC

## 2014-04-18 NOTE — Progress Notes (Signed)
Subjective:  Patient ID: Leslie Robbins, female    DOB: 26-Sep-1955  Age: 59 y.o. MRN: [id number]  CC: Hyperlipidemia; Hypothyroidism; and Right hip pain   HPI Leslie Robbins presents for knee pain. returns today for follow-up on left knee pain. The pain is different from before her surgery last July. She is able to function she is back at work although the pain was not present until she started work she is able to endure her day. The pain comes and goes most of the time is rather mild but she has had rather severe pains at times. Currently she uses Advil as needed for the pain. She does have right hip pain as well. The orthopedist to replace the knee also put a shot in the hip approximately 11 months ago. The shot seemed to help with the right hip pain until about a month ago. 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  Patient presents for follow-up on  thyroid. She has a history of hypothyroidism for many years. It has been stable recently. Pt. denies any change in  voice, loss of hair, heat or cold intolerance. Energy level has been adequate to good. She denies constipation and diarrhea. No myxedema. Medication is as noted below. Verified that pt is taking it daily on an empty stomach. Well tolerated. Leslie Robbins states that at times she is cold other times she breaks out in sweats. She is a bit cold now. She had some diarrhea frequently and soft stools before her colonoscopy a year ago now her stools have fairly normalized but she does have occasional hard painful bowel movement but no chronic constipation symptoms. Asian had diverticulosis noted on her colonoscopy.  Patient in for follow-up of GERD. He is currently asymptomatic taking his PPI daily. There is frequent indigestion or heartburn. He is taking the medication regularly. No dysphagia or choking."  Patient in for follow-up of hypertension. Patient has no history of headache chest pain or shortness of  breath or recent cough. Patient also denies symptoms of TIA such as numbness weakness lateralizing. Patient checks  blood pressure at home and has not had any elevated readings recently. Patient denies side effects from his medication. States taking it regularly.   story Leslie Robbins has a past medical history of Depression; Hypothyroidism; Hypertension; Hypercholesteremia; Arthritis; GERD (gastroesophageal reflux disease); and Anxiety.   She has past surgical history that includes Abdominal hysterectomy; Tubal ligation; Right Carpal Tunnel Release; Breast lumpectomy; Colonoscopy (N/A, 05/30/2012); Dilation and curettage of uterus; Cardiac catheterization; and Total knee arthroplasty (Left, 09/16/2013).   Her family history includes Dementia in her mother; Depression in her sister; Fibromyalgia in her sister; Hypertension in her mother; Lupus in her mother. There is no history of Colon cancer.She reports that she has never smoked. She has never used smokeless tobacco. She reports that she does not drink alcohol or use illicit drugs.  Current Outpatient Prescriptions on File Prior to Visit  Medication Sig Dispense Refill  . amLODipine (NORVASC) 2.5 MG tablet TAKE 1 TABLET (2.5 MG TOTAL) BY MOUTH DAILY. 30 tablet 0  . atorvastatin (LIPITOR) 40 MG tablet Take 40 mg by mouth daily at 6 PM.    . benazepril-hydrochlorthiazide (LOTENSIN HCT) 20-25 MG per tablet TAKE 1 TABLET BY MOUTH DAILY. 30 tablet 2  . clorazepate (TRANXENE) 3.75 MG tablet TAKE 1 TABLET BY MOUTH TWICE A DAY 60 tablet 0  . escitalopram (LEXAPRO) 20 MG tablet TAKE 1 TABLET (  20 MG TOTAL) BY MOUTH DAILY. 30 tablet 0  . levothyroxine (SYNTHROID, LEVOTHROID) 125 MCG tablet Take 1 tablet (125 mcg total) by mouth daily. 90 tablet 1  . methocarbamol (ROBAXIN) 500 MG tablet Take 1 tablet (500 mg total) by mouth every 6 (six) hours as needed for muscle spasms. 80 tablet 0  . omeprazole (PRILOSEC) 40 MG capsule TAKE 1 CAPSULE (40 MG TOTAL) BY MOUTH DAILY.  30 capsule 5  . aspirin 81 MG tablet Take 81 mg by mouth daily.     No current facility-administered medications on file prior to visit.    ROS Review of Systems  Constitutional: Negative for fever, chills, diaphoresis, appetite change, fatigue and unexpected weight change.  HENT: Negative for congestion, ear pain, hearing loss, postnasal drip, rhinorrhea, sneezing, sore throat and trouble swallowing.   Eyes: Negative for pain.  Respiratory: Negative for cough, chest tightness and shortness of breath.   Cardiovascular: Negative for chest pain and palpitations.  Gastrointestinal: Negative for nausea, vomiting, abdominal pain, diarrhea and constipation.  Genitourinary: Negative for dysuria, frequency and menstrual problem.  Musculoskeletal: Positive for arthralgias. Negative for myalgias, back pain, joint swelling and neck stiffness.  Skin: Negative for rash.  Neurological: Negative for dizziness, weakness, numbness and headaches.  Psychiatric/Behavioral: Negative for dysphoric mood and agitation.    Objective:  BP 109/74 mmHg  Pulse 62  Temp(Src) 97.7 F (36.5 C) (Oral)  Ht 5\' 4"  (1.626 m)  Wt 200 lb 9.6 oz (90.992 kg)  BMI 34.42 kg/m2  BP Readings from Last 3 Encounters:  04/18/14 109/74  12/30/13 106/76  10/31/13 139/96    Wt Readings from Last 3 Encounters:  04/18/14 200 lb 9.6 oz (90.992 kg)  12/30/13 205 lb 12.8 oz (93.35 kg)  10/31/13 203 lb (92.08 kg)     Physical Exam  Constitutional: She is oriented to person, place, and time. She appears well-developed and well-nourished. No distress.  HENT:  Head: Normocephalic and atraumatic.  Right Ear: External ear normal.  Left Ear: External ear normal.  Nose: Nose normal.  Mouth/Throat: Oropharynx is clear and moist.  Eyes: Conjunctivae and EOM are normal. Pupils are equal, round, and reactive to light.  Neck: Normal range of motion. Neck supple. No thyromegaly present.  Cardiovascular: Normal rate, regular rhythm  and normal heart sounds.   No murmur heard. Pulmonary/Chest: Effort normal and breath sounds normal. No respiratory distress. She has no wheezes. She has no rales.  Abdominal: Soft. Bowel sounds are normal. She exhibits no distension. There is no tenderness.  Lymphadenopathy:    She has no cervical adenopathy.  Neurological: She is alert and oriented to person, place, and time. She has normal reflexes.  Skin: Skin is warm and dry.  Psychiatric: She has a normal mood and affect. Her behavior is normal. Judgment and thought content normal.    No results found for: HGBA1C  Lab Results  Component Value Date   WBC 5.9 04/18/2014   HGB 12.5 04/18/2014   HCT 41.7 04/18/2014   PLT 178 09/18/2013   GLUCOSE 78 12/30/2013   CHOL 196 12/30/2013   TRIG 138 12/30/2013   HDL 45 12/30/2013   LDLCALC 80 07/11/2013   ALT 15 12/30/2013   AST 22 12/30/2013   NA 141 12/30/2013   K 4.3 12/30/2013   CL 101 12/30/2013   CREATININE 0.98 12/30/2013   BUN 20 12/30/2013   CO2 26 12/30/2013   TSH 0.023* 12/30/2013   INR 0.93 09/05/2013    No results  found.  Assessment & Plan:   Annabella was seen today for hyperlipidemia, hypothyroidism and right hip pain.  Diagnoses and all orders for this visit:  Essential hypertension  Other specified hypothyroidism Orders: -     Thyroid Panel With TSH  Hyperlipidemia with target LDL less than 100 Orders: -     POCT CBC -     CMP14+EGFR -     Lipid panel  Right hip pain Orders: -     DG HIP UNILAT WITH PELVIS 2-3 VIEWS RIGHT -     Ambulatory referral to Orthopedic Surgery  Arthritis, hip Orders: -     Ambulatory referral to Orthopedic Surgery  Other orders -     meloxicam (MOBIC) 15 MG tablet; Take 1 tablet (15 mg total) by mouth daily.   I have discontinued Ms. Cotten's Red Yeast Rice. I am also having her start on meloxicam. Additionally, I am having her maintain her atorvastatin, methocarbamol, aspirin, levothyroxine, omeprazole, clorazepate,  amLODipine, escitalopram, and benazepril-hydrochlorthiazide.  Meds ordered this encounter  Medications  . meloxicam (MOBIC) 15 MG tablet    Sig: Take 1 tablet (15 mg total) by mouth daily.    Dispense:  90 tablet    Refill:  3    Follow-up: Return in about 6 months (around 10/17/2014), or if symptoms worsen or fail to improve.  Claretta Fraise, M.D.

## 2014-04-19 LAB — CMP14+EGFR
A/G RATIO: 1.5 (ref 1.1–2.5)
ALBUMIN: 4 g/dL (ref 3.6–4.8)
ALK PHOS: 140 IU/L — AB (ref 39–117)
ALT: 34 IU/L — ABNORMAL HIGH (ref 0–32)
AST: 42 IU/L — ABNORMAL HIGH (ref 0–40)
BUN / CREAT RATIO: 21 (ref 11–26)
BUN: 21 mg/dL (ref 8–27)
Bilirubin Total: 0.8 mg/dL (ref 0.0–1.2)
CALCIUM: 9.7 mg/dL (ref 8.7–10.3)
CO2: 27 mmol/L (ref 18–29)
Chloride: 104 mmol/L (ref 97–108)
Creatinine, Ser: 0.98 mg/dL (ref 0.57–1.00)
GFR calc Af Amer: 72 mL/min/{1.73_m2} (ref >59)
GFR calc non Af Amer: 62 mL/min/{1.73_m2} (ref >59)
GLOBULIN, TOTAL: 2.7 g/dL (ref 1.5–4.5)
Glucose: 82 mg/dL (ref 65–99)
POTASSIUM: 3.7 mmol/L (ref 3.5–5.2)
Sodium: 143 mmol/L (ref 134–144)
Total Protein: 6.7 g/dL (ref 6.0–8.5)

## 2014-04-19 LAB — LIPID PANEL
CHOL/HDL RATIO: 2.5 ratio (ref 0.0–4.4)
Cholesterol, Total: 126 mg/dL (ref 100–199)
HDL: 50 mg/dL (ref >39)
LDL Calculated: 63 mg/dL (ref 0–99)
TRIGLYCERIDES: 64 mg/dL (ref 0–149)
VLDL CHOLESTEROL CAL: 13 mg/dL (ref 5–40)

## 2014-04-19 LAB — THYROID PANEL WITH TSH
Free Thyroxine Index: 3.5 (ref 1.2–4.9)
T3 UPTAKE RATIO: 29 % (ref 24–39)
T4, Total: 11.9 ug/dL (ref 4.5–12.0)
TSH: 0.106 u[IU]/mL — ABNORMAL LOW (ref 0.450–4.500)

## 2014-06-20 ENCOUNTER — Ambulatory Visit (INDEPENDENT_AMBULATORY_CARE_PROVIDER_SITE_OTHER): Payer: BLUE CROSS/BLUE SHIELD | Admitting: Family Medicine

## 2014-06-20 ENCOUNTER — Encounter: Payer: Self-pay | Admitting: Family Medicine

## 2014-06-20 VITALS — Ht 64.0 in

## 2014-06-20 DIAGNOSIS — I10 Essential (primary) hypertension: Secondary | ICD-10-CM | POA: Diagnosis not present

## 2014-06-20 DIAGNOSIS — E039 Hypothyroidism, unspecified: Secondary | ICD-10-CM | POA: Diagnosis not present

## 2014-06-20 DIAGNOSIS — R7989 Other specified abnormal findings of blood chemistry: Secondary | ICD-10-CM

## 2014-06-20 DIAGNOSIS — E785 Hyperlipidemia, unspecified: Secondary | ICD-10-CM

## 2014-06-20 DIAGNOSIS — R945 Abnormal results of liver function studies: Principal | ICD-10-CM

## 2014-06-20 MED ORDER — DULOXETINE HCL 30 MG PO CPEP: 30.0000 mg | ORAL_CAPSULE | Freq: Every day | ORAL | Status: DC

## 2014-06-20 MED ORDER — BENAZEPRIL-HYDROCHLOROTHIAZIDE 20-25 MG PO TABS: 1.0000 | ORAL_TABLET | Freq: Every day | ORAL | Status: DC

## 2014-06-20 MED ORDER — OMEPRAZOLE 40 MG PO CPDR: DELAYED_RELEASE_CAPSULE | ORAL | Status: DC

## 2014-06-20 MED ORDER — LEVOTHYROXINE SODIUM 125 MCG PO TABS: 125.0000 ug | ORAL_TABLET | Freq: Every day | ORAL | Status: DC

## 2014-06-20 MED ORDER — ATORVASTATIN CALCIUM 40 MG PO TABS: 40.0000 mg | ORAL_TABLET | Freq: Every day | ORAL | Status: DC

## 2014-06-20 NOTE — Progress Notes (Signed)
Subjective:  Patient ID: Leslie Robbins, female    DOB: 1955-03-31  Age: 59 y.o. MRN: [id number]  CC: Hyperlipidemia; Hypertension; and Hypothyroidism   HPI Leslie Robbins presents for Patient in for follow-up of elevated cholesterol. Doing well without complaints on current medication. Denies side effects of statin including myalgia and arthralgia and nausea. Also in today for liver function testing. Currently no chest pain, shortness of breath or other cardiovascular related symptoms noted.  Patient presents for follow-up on  thyroid. She has a history of hypothyroidism for many years. It has been stable recently. Pt. denies any change in  voice, loss of hair, heat or cold intolerance. Energy level has been adequate to good. She denies constipation and diarrhea. No myxedema. Medication is as noted below. Verified that pt is taking it daily on an empty stomach. Well tolerated.   follow-up of hypertension. Patient has no history of headache chest pain or shortness of breath or recent cough. Patient also denies symptoms of TIA such as numbness weakness lateralizing. Patient checks  blood pressure at home and has not had any elevated readings recently. Patient denies side effects from his medication. States taking it regularly.  History Leslie Robbins has a past medical history of Depression; Hypothyroidism; Hypertension; Hypercholesteremia; Arthritis; GERD (gastroesophageal reflux disease); and Anxiety.   She has past surgical history that includes Abdominal hysterectomy; Tubal ligation; Right Carpal Tunnel Release; Breast lumpectomy; Colonoscopy (N/A, 05/30/2012); Dilation and curettage of uterus; Cardiac catheterization; and Total knee arthroplasty (Left, 09/16/2013).   Her family history includes Dementia in her mother; Depression in her sister; Fibromyalgia in her sister; Hypertension in her mother; Lupus in her mother. There is no history of Colon cancer.She reports that she has never smoked. She has never  used smokeless tobacco. She reports that she does not drink alcohol or use illicit drugs.  Current Outpatient Prescriptions on File Prior to Visit  Medication Sig Dispense Refill  . amLODipine (NORVASC) 2.5 MG tablet TAKE 1 TABLET (2.5 MG TOTAL) BY MOUTH DAILY. 30 tablet 0  . aspirin 81 MG tablet Take 81 mg by mouth daily.    . clorazepate (TRANXENE) 3.75 MG tablet TAKE 1 TABLET BY MOUTH TWICE A DAY 60 tablet 0  . escitalopram (LEXAPRO) 20 MG tablet TAKE 1 TABLET (20 MG TOTAL) BY MOUTH DAILY. 30 tablet 0  . meloxicam (MOBIC) 15 MG tablet Take 1 tablet (15 mg total) by mouth daily. 90 tablet 3   No current facility-administered medications on file prior to visit.    ROS Review of Systems  Constitutional: Negative for fever, chills, diaphoresis, appetite change, fatigue and unexpected weight change.  HENT: Negative for congestion, ear pain, hearing loss, postnasal drip, rhinorrhea, sneezing, sore throat and trouble swallowing.   Eyes: Negative for pain.  Respiratory: Negative for cough, chest tightness and shortness of breath.   Cardiovascular: Negative for chest pain and palpitations.  Gastrointestinal: Negative for nausea, vomiting, abdominal pain, diarrhea and constipation.  Genitourinary: Negative for dysuria, frequency and menstrual problem.  Musculoskeletal: Negative for joint swelling and arthralgias.  Skin: Negative for rash.  Neurological: Negative for dizziness, weakness, numbness and headaches.  Psychiatric/Behavioral: Negative for dysphoric mood and agitation.    Objective:  Ht $R'5\' 4"'Tf$  (1.626 m)  BP Readings from Last 3 Encounters:  04/18/14 109/74  12/30/13 106/76  10/31/13 139/96    Wt Readings from Last 3 Encounters:  04/18/14 200 lb 9.6 oz (90.992 kg)  12/30/13 205 lb 12.8 oz (93.35 kg)  10/31/13 203  lb (92.08 kg)     Physical Exam  Constitutional: She is oriented to person, place, and time. She appears well-developed and well-nourished. No distress.  HENT:    Head: Normocephalic and atraumatic.  Right Ear: External ear normal.  Left Ear: External ear normal.  Nose: Nose normal.  Mouth/Throat: Oropharynx is clear and moist.  Eyes: Conjunctivae and EOM are normal. Pupils are equal, round, and reactive to light.  Neck: Normal range of motion. Neck supple. No thyromegaly present.  Cardiovascular: Normal rate, regular rhythm and normal heart sounds.   No murmur heard. Pulmonary/Chest: Effort normal and breath sounds normal. No respiratory distress. She has no wheezes. She has no rales.  Abdominal: Soft. Bowel sounds are normal. She exhibits no distension. There is no tenderness.  Lymphadenopathy:    She has no cervical adenopathy.  Neurological: She is alert and oriented to person, place, and time. She has normal reflexes.  Skin: Skin is warm and dry.  Psychiatric: She has a normal mood and affect. Her behavior is normal. Judgment and thought content normal.    No results found for: HGBA1C  Lab Results  Component Value Date   WBC 5.9 04/18/2014   HGB 12.5 04/18/2014   HCT 41.7 04/18/2014   PLT 178 09/18/2013   GLUCOSE 77 06/20/2014   CHOL 126 04/18/2014   TRIG 64 04/18/2014   HDL 50 04/18/2014   LDLCALC 63 04/18/2014   ALT 38* 06/20/2014   AST 35 06/20/2014   NA 141 06/20/2014   K 4.4 06/20/2014   CL 100 06/20/2014   CREATININE 0.98 06/20/2014   BUN 29* 06/20/2014   CO2 25 06/20/2014   TSH 0.106* 04/18/2014   INR 0.93 09/05/2013    No results found.  Assessment & Plan:   Leslie Robbins was seen today for hyperlipidemia, hypertension and hypothyroidism.  Diagnoses and all orders for this visit:  Elevated LFTs Orders: -     CMP14+EGFR  Hyperlipidemia with target LDL less than 100  Hypothyroidism, unspecified hypothyroidism type Orders: -     T4, free  Essential hypertension  Other orders -     atorvastatin (LIPITOR) 40 MG tablet; Take 1 tablet (40 mg total) by mouth daily at 6 PM. -     benazepril-hydrochlorthiazide  (LOTENSIN HCT) 20-25 MG per tablet; Take 1 tablet by mouth daily. -     levothyroxine (SYNTHROID, LEVOTHROID) 125 MCG tablet; Take 1 tablet (125 mcg total) by mouth daily. -     omeprazole (PRILOSEC) 40 MG capsule; TAKE 1 CAPSULE (40 MG TOTAL) BY MOUTH DAILY. -     DULoxetine (CYMBALTA) 30 MG capsule; Take 1 capsule (30 mg total) by mouth daily. For 1 week then 2 daily with supper   I have discontinued Ms. Rapaport's methocarbamol. I have also changed her atorvastatin. Additionally, I am having her start on DULoxetine. Lastly, I am having her maintain her aspirin, clorazepate, amLODipine, escitalopram, meloxicam, HYDROcodone-acetaminophen, benazepril-hydrochlorthiazide, levothyroxine, and omeprazole.  Meds ordered this encounter  Medications  . HYDROcodone-acetaminophen (NORCO/VICODIN) 5-325 MG per tablet    Sig: Take 1 tablet by mouth 2 (two) times daily as needed.    Refill:  0  . atorvastatin (LIPITOR) 40 MG tablet    Sig: Take 1 tablet (40 mg total) by mouth daily at 6 PM.    Dispense:  30 tablet    Refill:  11  . benazepril-hydrochlorthiazide (LOTENSIN HCT) 20-25 MG per tablet    Sig: Take 1 tablet by mouth daily.    Dispense:  30 tablet    Refill:  4  . levothyroxine (SYNTHROID, LEVOTHROID) 125 MCG tablet    Sig: Take 1 tablet (125 mcg total) by mouth daily.    Dispense:  30 tablet    Refill:  11  . omeprazole (PRILOSEC) 40 MG capsule    Sig: TAKE 1 CAPSULE (40 MG TOTAL) BY MOUTH DAILY.    Dispense:  30 capsule    Refill:  5  . DULoxetine (CYMBALTA) 30 MG capsule    Sig: Take 1 capsule (30 mg total) by mouth daily. For 1 week then 2 daily with supper    Dispense:  60 capsule    Refill:  0     Follow-up: Return in about 6 months (around 12/20/2014).  Claretta Fraise, M.D.

## 2014-06-21 LAB — CMP14+EGFR
ALT: 38 IU/L — AB (ref 0–32)
AST: 35 IU/L (ref 0–40)
Albumin/Globulin Ratio: 1.7 (ref 1.1–2.5)
Albumin: 4.5 g/dL (ref 3.6–4.8)
Alkaline Phosphatase: 170 IU/L — ABNORMAL HIGH (ref 39–117)
BUN/Creatinine Ratio: 30 — ABNORMAL HIGH (ref 11–26)
BUN: 29 mg/dL — ABNORMAL HIGH (ref 8–27)
Bilirubin Total: 0.8 mg/dL (ref 0.0–1.2)
CALCIUM: 10.3 mg/dL (ref 8.7–10.3)
CO2: 25 mmol/L (ref 18–29)
Chloride: 100 mmol/L (ref 97–108)
Creatinine, Ser: 0.98 mg/dL (ref 0.57–1.00)
GFR calc Af Amer: 72 mL/min/{1.73_m2} (ref >59)
GFR calc non Af Amer: 62 mL/min/{1.73_m2} (ref >59)
Globulin, Total: 2.7 g/dL (ref 1.5–4.5)
Glucose: 77 mg/dL (ref 65–99)
POTASSIUM: 4.4 mmol/L (ref 3.5–5.2)
Sodium: 141 mmol/L (ref 134–144)
Total Protein: 7.2 g/dL (ref 6.0–8.5)

## 2014-06-21 LAB — T4, FREE: Free T4: 1.29 ng/dL (ref 0.82–1.77)

## 2014-06-26 ENCOUNTER — Other Ambulatory Visit: Payer: Self-pay | Admitting: Nurse Practitioner

## 2014-07-24 ENCOUNTER — Other Ambulatory Visit: Payer: Self-pay | Admitting: Family Medicine

## 2014-08-01 ENCOUNTER — Encounter: Payer: Self-pay | Admitting: Family Medicine

## 2014-08-01 ENCOUNTER — Ambulatory Visit (INDEPENDENT_AMBULATORY_CARE_PROVIDER_SITE_OTHER): Payer: BLUE CROSS/BLUE SHIELD | Admitting: Family Medicine

## 2014-08-01 VITALS — BP 117/72 | HR 79 | Temp 97.5°F | Ht 64.0 in | Wt 199.6 lb

## 2014-08-01 DIAGNOSIS — L03011 Cellulitis of right finger: Secondary | ICD-10-CM

## 2014-08-01 DIAGNOSIS — M1610 Unilateral primary osteoarthritis, unspecified hip: Secondary | ICD-10-CM

## 2014-08-01 DIAGNOSIS — M1990 Unspecified osteoarthritis, unspecified site: Secondary | ICD-10-CM

## 2014-08-01 MED ORDER — DULOXETINE HCL 60 MG PO CPEP: 60.0000 mg | ORAL_CAPSULE | Freq: Every day | ORAL | Status: DC

## 2014-08-01 MED ORDER — CIPROFLOXACIN HCL 500 MG PO TABS: 500.0000 mg | ORAL_TABLET | Freq: Two times a day (BID) | ORAL | Status: DC

## 2014-08-01 NOTE — Progress Notes (Signed)
Subjective:  Patient ID: Leslie Robbins, female    DOB: 05-27-1955  Age: 59 y.o. MRN: [id number]  CC: Hip Pain   HPI Leslie Robbins presents for recurrent right hip pain. She was seen for this approximately 3 months ago. She reports improvement with the use of the duloxetine both for her anxiety and for the pain. Recently she has noted some increase in the pain. She had an injection by orthopedics into the joint and that helped significantly. Pain is now mild to moderate but increasing over the last week or 2 and she wanted a second look before it got as bad as it was before.  History Leslie Robbins has a past medical history of Depression; Hypothyroidism; Hypertension; Hypercholesteremia; Arthritis; GERD (gastroesophageal reflux disease); and Anxiety.   She has past surgical history that includes Abdominal hysterectomy; Tubal ligation; Right Carpal Tunnel Release; Breast lumpectomy; Colonoscopy (N/A, 05/30/2012); Dilation and curettage of uterus; Cardiac catheterization; and Total knee arthroplasty (Left, 09/16/2013).   Her family history includes Dementia in her mother; Depression in her sister; Fibromyalgia in her sister; Hypertension in her mother; Lupus in her mother. There is no history of Colon cancer.She reports that she has never smoked. She has never used smokeless tobacco. She reports that she does not drink alcohol or use illicit drugs.  Outpatient Prescriptions Prior to Visit  Medication Sig Dispense Refill  . amLODipine (NORVASC) 2.5 MG tablet TAKE 1 TABLET (2.5 MG TOTAL) BY MOUTH DAILY. 30 tablet 0  . aspirin 81 MG tablet Take 81 mg by mouth daily.    Marland Kitchen atorvastatin (LIPITOR) 40 MG tablet Take 1 tablet (40 mg total) by mouth daily at 6 PM. 30 tablet 11  . benazepril-hydrochlorthiazide (LOTENSIN HCT) 20-25 MG per tablet Take 1 tablet by mouth daily. 30 tablet 4  . clorazepate (TRANXENE) 3.75 MG tablet TAKE 1 TABLET BY MOUTH TWICE A DAY 60 tablet 0  . HYDROcodone-acetaminophen (NORCO/VICODIN)  5-325 MG per tablet Take 1 tablet by mouth 2 (two) times daily as needed.  0  . levothyroxine (SYNTHROID, LEVOTHROID) 125 MCG tablet Take 1 tablet (125 mcg total) by mouth daily. 30 tablet 11  . meloxicam (MOBIC) 15 MG tablet Take 1 tablet (15 mg total) by mouth daily. 90 tablet 3  . omeprazole (PRILOSEC) 40 MG capsule TAKE 1 CAPSULE (40 MG TOTAL) BY MOUTH DAILY. 30 capsule 5  . DULoxetine (CYMBALTA) 30 MG capsule TAKE 1 CAPSULE (30 MG TOTAL) BY MOUTH DAILY. FOR 1 WEEK THEN 2 DAILY WITH SUPPER 60 capsule 1  . escitalopram (LEXAPRO) 20 MG tablet TAKE 1 TABLET (20 MG TOTAL) BY MOUTH DAILY. (Patient not taking: Reported on 08/01/2014) 30 tablet 0   No facility-administered medications prior to visit.    ROS Review of Systems  Constitutional: Negative for fever, chills, diaphoresis, appetite change, fatigue and unexpected weight change.  HENT: Negative for congestion, ear pain, hearing loss, postnasal drip, rhinorrhea, sneezing, sore throat and trouble swallowing.   Eyes: Negative for pain.  Respiratory: Negative for cough, chest tightness and shortness of breath.   Cardiovascular: Negative for chest pain and palpitations.  Gastrointestinal: Negative for nausea, vomiting, abdominal pain, diarrhea and constipation.  Genitourinary: Negative for dysuria, frequency and menstrual problem.  Musculoskeletal:       See history of present illness  Skin: Positive for wound (there is an erythematous lesion of the left second finger where she pulled a hangnail away from the cuticle several days ago). Negative for rash.  Neurological: Negative for dizziness,  weakness, numbness and headaches.  Psychiatric/Behavioral: Negative for dysphoric mood and agitation.    Objective:  BP 117/72 mmHg  Pulse 79  Temp(Src) 97.5 F (36.4 C) (Oral)  Ht 5\' 4"  (1.626 m)  Wt 199 lb 9.6 oz (90.538 kg)  BMI 34.24 kg/m2  BP Readings from Last 3 Encounters:  08/01/14 117/72  04/18/14 109/74  12/30/13 106/76    Wt  Readings from Last 3 Encounters:  08/01/14 199 lb 9.6 oz (90.538 kg)  04/18/14 200 lb 9.6 oz (90.992 kg)  12/30/13 205 lb 12.8 oz (93.35 kg)     Physical Exam  Constitutional: She is oriented to person, place, and time. She appears well-developed and well-nourished. No distress.  HENT:  Head: Normocephalic and atraumatic.  Right Ear: External ear normal.  Left Ear: External ear normal.  Nose: Nose normal.  Mouth/Throat: Oropharynx is clear and moist.  Eyes: Conjunctivae and EOM are normal. Pupils are equal, round, and reactive to light.  Neck: Normal range of motion. Neck supple. No thyromegaly present.  Cardiovascular: Normal rate, regular rhythm and normal heart sounds.   No murmur heard. Pulmonary/Chest: Effort normal and breath sounds normal. No respiratory distress. She has no wheezes. She has no rales.  Abdominal: Soft. Bowel sounds are normal. She exhibits no distension. There is no tenderness.  Musculoskeletal: Normal range of motion. She exhibits tenderness (mild right hip tenderness for range of motion).  Lymphadenopathy:    She has no cervical adenopathy.  Neurological: She is alert and oriented to person, place, and time. She has normal reflexes.  Skin: Skin is warm and dry.  Psychiatric: She has a normal mood and affect. Her behavior is normal. Judgment and thought content normal.    No results found for: HGBA1C  Lab Results  Component Value Date   WBC 5.9 04/18/2014   HGB 12.5 04/18/2014   HCT 41.7 04/18/2014   PLT 178 09/18/2013   GLUCOSE 77 06/20/2014   CHOL 126 04/18/2014   TRIG 64 04/18/2014   HDL 50 04/18/2014   LDLCALC 63 04/18/2014   ALT 38* 06/20/2014   AST 35 06/20/2014   NA 141 06/20/2014   K 4.4 06/20/2014   CL 100 06/20/2014   CREATININE 0.98 06/20/2014   BUN 29* 06/20/2014   CO2 25 06/20/2014   TSH 0.106* 04/18/2014   INR 0.93 09/05/2013    No results found.  Assessment & Plan:   Leslie Robbins was seen today for hip pain.  Diagnoses and  all orders for this visit:  Arthritis, hip  Paronychia of finger, right  Other orders -     ciprofloxacin (CIPRO) 500 MG tablet; Take 1 tablet (500 mg total) by mouth 2 (two) times daily. -     DULoxetine (CYMBALTA) 60 MG capsule; Take 1 capsule (60 mg total) by mouth daily.   I have discontinued Leslie Robbins's escitalopram. I have also changed her DULoxetine. Additionally, I am having her start on ciprofloxacin. Lastly, I am having her maintain her aspirin, clorazepate, amLODipine, meloxicam, HYDROcodone-acetaminophen, atorvastatin, benazepril-hydrochlorthiazide, levothyroxine, and omeprazole.  Meds ordered this encounter  Medications  . ciprofloxacin (CIPRO) 500 MG tablet    Sig: Take 1 tablet (500 mg total) by mouth 2 (two) times daily.    Dispense:  20 tablet    Refill:  0  . DULoxetine (CYMBALTA) 60 MG capsule    Sig: Take 1 capsule (60 mg total) by mouth daily.    Dispense:  30 capsule    Refill:  5  Follow-up: Return in about 6 months (around 02/01/2015).  Claretta Fraise, M.D.

## 2014-08-02 ENCOUNTER — Other Ambulatory Visit: Payer: Self-pay | Admitting: Nurse Practitioner

## 2014-08-18 ENCOUNTER — Other Ambulatory Visit: Payer: Self-pay | Admitting: Nurse Practitioner

## 2014-08-18 NOTE — Telephone Encounter (Signed)
Last seen 08/01/14 Dr Livia Snellen

## 2014-08-19 ENCOUNTER — Other Ambulatory Visit: Payer: Self-pay | Admitting: Nurse Practitioner

## 2014-09-03 ENCOUNTER — Telehealth: Payer: Self-pay | Admitting: Family Medicine

## 2014-09-03 ENCOUNTER — Other Ambulatory Visit: Payer: Self-pay | Admitting: *Deleted

## 2014-09-03 MED ORDER — BENAZEPRIL-HYDROCHLOROTHIAZIDE 20-25 MG PO TABS
1.0000 | ORAL_TABLET | Freq: Every day | ORAL | Status: DC
Start: 2014-09-03 — End: 2015-03-27

## 2014-09-03 NOTE — Progress Notes (Signed)
Rx for Benazepril/HCT sent into pharmacy

## 2014-09-10 ENCOUNTER — Telehealth: Payer: Self-pay | Admitting: Family Medicine

## 2014-09-10 NOTE — Telephone Encounter (Signed)
Pt had labs drawn at wellness fair at work Elevated LFTs, low TSH Scheduled f/u visit

## 2014-09-17 ENCOUNTER — Encounter: Payer: Self-pay | Admitting: Family Medicine

## 2014-09-17 ENCOUNTER — Ambulatory Visit (INDEPENDENT_AMBULATORY_CARE_PROVIDER_SITE_OTHER): Payer: BLUE CROSS/BLUE SHIELD | Admitting: Family Medicine

## 2014-09-17 VITALS — BP 124/82 | HR 70 | Temp 97.9°F | Ht 64.0 in | Wt 204.4 lb

## 2014-09-17 DIAGNOSIS — E785 Hyperlipidemia, unspecified: Secondary | ICD-10-CM | POA: Diagnosis not present

## 2014-09-17 DIAGNOSIS — R945 Abnormal results of liver function studies: Secondary | ICD-10-CM

## 2014-09-17 DIAGNOSIS — R7989 Other specified abnormal findings of blood chemistry: Secondary | ICD-10-CM | POA: Diagnosis not present

## 2014-09-17 DIAGNOSIS — E038 Other specified hypothyroidism: Secondary | ICD-10-CM

## 2014-09-17 DIAGNOSIS — I10 Essential (primary) hypertension: Secondary | ICD-10-CM | POA: Diagnosis not present

## 2014-09-17 NOTE — Progress Notes (Signed)
Subjective:  Patient ID: Leslie Robbins, female    DOB: 14-Oct-1955  Age: 59 y.o. MRN: [id number]  CC: Hypothyroidism and elevated LFTs   HPI Leslie Robbins presents for follow up of thyroid. No recent change in dose. However, recent TSH was low. She does not feel sluggish. She is post menopause. No weight change. No cold intolerance. Some hair loss. Bowels nml functioning.  She recently had bloodwork showing LFTs elevated. Denies hx of liver disease. She does not drink alcohol.She currently uses atorvastatin for cholesterol. Went off due to LFT elevation.   HTN stable without c/o side effects with amlodipine & lotensin HCT. No chest pain or dyspnea.  History Leslie Robbins has a past medical history of Depression; Hypothyroidism; Hypertension; Hypercholesteremia; Arthritis; GERD (gastroesophageal reflux disease); and Anxiety.   She has past surgical history that includes Abdominal hysterectomy; Tubal ligation; Right Carpal Tunnel Release; Breast lumpectomy; Colonoscopy (N/A, 05/30/2012); Dilation and curettage of uterus; Cardiac catheterization; and Total knee arthroplasty (Left, 09/16/2013).   Her family history includes Dementia in her mother; Depression in her sister; Fibromyalgia in her sister; Hypertension in her mother; Lupus in her mother. There is no history of Colon cancer.She reports that she has never smoked. She has never used smokeless tobacco. She reports that she does not drink alcohol or use illicit drugs.  Outpatient Prescriptions Prior to Visit  Medication Sig Dispense Refill  . amLODipine (NORVASC) 2.5 MG tablet TAKE 1 TABLET (2.5 MG TOTAL) BY MOUTH DAILY. 90 tablet 1  . aspirin 81 MG tablet Take 81 mg by mouth daily.    Marland Kitchen atorvastatin (LIPITOR) 40 MG tablet Take 1 tablet (40 mg total) by mouth daily at 6 PM. 30 tablet 11  . benazepril-hydrochlorthiazide (LOTENSIN HCT) 20-25 MG per tablet Take 1 tablet by mouth daily. 30 tablet 11  . clorazepate (TRANXENE) 3.75 MG tablet TAKE 1 TABLET  TWICE A DAY 60 tablet 1  . DULoxetine (CYMBALTA) 60 MG capsule Take 1 capsule (60 mg total) by mouth daily. 30 capsule 5  . levothyroxine (SYNTHROID, LEVOTHROID) 125 MCG tablet Take 1 tablet (125 mcg total) by mouth daily. 30 tablet 11  . meloxicam (MOBIC) 15 MG tablet Take 1 tablet (15 mg total) by mouth daily. 90 tablet 3  . omeprazole (PRILOSEC) 40 MG capsule TAKE 1 CAPSULE (40 MG TOTAL) BY MOUTH DAILY. 30 capsule 5  . HYDROcodone-acetaminophen (NORCO/VICODIN) 5-325 MG per tablet Take 1 tablet by mouth 2 (two) times daily as needed.  0  . amLODipine (NORVASC) 2.5 MG tablet TAKE 1 TABLET (2.5 MG TOTAL) BY MOUTH DAILY. (Patient not taking: Reported on 09/17/2014) 30 tablet 0  . ciprofloxacin (CIPRO) 500 MG tablet Take 1 tablet (500 mg total) by mouth 2 (two) times daily. (Patient not taking: Reported on 09/17/2014) 20 tablet 0   No facility-administered medications prior to visit.    ROS Review of Systems  Constitutional: Negative for fever, chills, diaphoresis, appetite change, fatigue and unexpected weight change.  HENT: Negative for congestion, ear pain, hearing loss, postnasal drip, rhinorrhea, sneezing, sore throat and trouble swallowing.   Eyes: Negative for pain.  Respiratory: Negative for cough, chest tightness and shortness of breath.   Cardiovascular: Negative for chest pain and palpitations.  Gastrointestinal: Negative for nausea, vomiting, abdominal pain, diarrhea and constipation.  Genitourinary: Negative for dysuria, frequency and menstrual problem.  Musculoskeletal: Negative for joint swelling and arthralgias.  Skin: Negative for rash.  Neurological: Negative for dizziness, weakness, numbness and headaches.  Psychiatric/Behavioral: Negative for dysphoric  mood and agitation.    Objective:  BP 124/82 mmHg  Pulse 70  Temp(Src) 97.9 F (36.6 C) (Oral)  Ht $R'5\' 4"'jP$  (1.626 m)  Wt 204 lb 6.4 oz (92.715 kg)  BMI 35.07 kg/m2  BP Readings from Last 3 Encounters:  09/17/14 124/82   08/01/14 117/72  04/18/14 109/74    Wt Readings from Last 3 Encounters:  09/17/14 204 lb 6.4 oz (92.715 kg)  08/01/14 199 lb 9.6 oz (90.538 kg)  04/18/14 200 lb 9.6 oz (90.992 kg)     Physical Exam  Constitutional: She is oriented to person, place, and time. She appears well-developed and well-nourished. No distress.  HENT:  Head: Normocephalic and atraumatic.  Right Ear: External ear normal.  Left Ear: External ear normal.  Nose: Nose normal.  Mouth/Throat: Oropharynx is clear and moist.  Eyes: Conjunctivae and EOM are normal. Pupils are equal, round, and reactive to light.  Neck: Normal range of motion. Neck supple. No thyromegaly present.  Cardiovascular: Normal rate, regular rhythm and normal heart sounds.   No murmur heard. Pulmonary/Chest: Effort normal and breath sounds normal. No respiratory distress. She has no wheezes. She has no rales.  Abdominal: Soft. Bowel sounds are normal. She exhibits no distension. There is no tenderness.  Lymphadenopathy:    She has no cervical adenopathy.  Neurological: She is alert and oriented to person, place, and time. She has normal reflexes.  Skin: Skin is warm and dry.  Psychiatric: She has a normal mood and affect. Her behavior is normal. Judgment and thought content normal.    No results found for: HGBA1C  Lab Results  Component Value Date   WBC 5.9 04/18/2014   HGB 12.5 04/18/2014   HCT 41.7 04/18/2014   PLT 178 09/18/2013   GLUCOSE 79 09/17/2014   CHOL 126 04/18/2014   TRIG 64 04/18/2014   HDL 50 04/18/2014   LDLCALC 63 04/18/2014   ALT 78* 09/17/2014   AST 62* 09/17/2014   NA 140 09/17/2014   K 4.3 09/17/2014   CL 99 09/17/2014   CREATININE 0.90 09/17/2014   BUN 17 09/17/2014   CO2 25 09/17/2014   TSH 0.082* 09/17/2014   INR 0.93 09/05/2013    No results found.  Assessment & Plan:   Leslie Robbins was seen today for hypothyroidism and elevated lfts.  Diagnoses and all orders for this visit:  Elevated  LFTs Orders: -     CMP14+EGFR -     Hepatic function panel  Other specified hypothyroidism Orders: -     Thyroid Panel With TSH  Hyperlipidemia with target LDL less than 100 Orders: -     T4, free -     T3, Free   I have discontinued Leslie Robbins's ciprofloxacin. I am also having her maintain her aspirin, meloxicam, HYDROcodone-acetaminophen, atorvastatin, levothyroxine, omeprazole, DULoxetine, amLODipine, clorazepate, and benazepril-hydrochlorthiazide.  No orders of the defined types were placed in this encounter.     Follow-up: Return in about 2 months (around 11/18/2014).  Claretta Fraise, M.D.

## 2014-09-18 ENCOUNTER — Telehealth: Payer: Self-pay | Admitting: *Deleted

## 2014-09-18 DIAGNOSIS — R7989 Other specified abnormal findings of blood chemistry: Secondary | ICD-10-CM

## 2014-09-18 DIAGNOSIS — R945 Abnormal results of liver function studies: Principal | ICD-10-CM

## 2014-09-18 LAB — CMP14+EGFR
ALK PHOS: 166 IU/L — AB (ref 39–117)
ALT: 78 IU/L — ABNORMAL HIGH (ref 0–32)
AST: 62 IU/L — ABNORMAL HIGH (ref 0–40)
Albumin/Globulin Ratio: 1.2 (ref 1.1–2.5)
Albumin: 3.9 g/dL (ref 3.6–4.8)
BILIRUBIN TOTAL: 0.7 mg/dL (ref 0.0–1.2)
BUN/Creatinine Ratio: 19 (ref 11–26)
BUN: 17 mg/dL (ref 8–27)
CHLORIDE: 99 mmol/L (ref 97–108)
CO2: 25 mmol/L (ref 18–29)
Calcium: 9.7 mg/dL (ref 8.7–10.3)
Creatinine, Ser: 0.9 mg/dL (ref 0.57–1.00)
GFR calc Af Amer: 80 mL/min/{1.73_m2} (ref >59)
GFR, EST NON AFRICAN AMERICAN: 69 mL/min/{1.73_m2} (ref >59)
GLOBULIN, TOTAL: 3.3 g/dL (ref 1.5–4.5)
GLUCOSE: 79 mg/dL (ref 65–99)
POTASSIUM: 4.3 mmol/L (ref 3.5–5.2)
SODIUM: 140 mmol/L (ref 134–144)
Total Protein: 7.2 g/dL (ref 6.0–8.5)

## 2014-09-18 LAB — THYROID PANEL WITH TSH
FREE THYROXINE INDEX: 3 (ref 1.2–4.9)
T3 UPTAKE RATIO: 26 % (ref 24–39)
T4, Total: 11.5 ug/dL (ref 4.5–12.0)
TSH: 0.082 u[IU]/mL — AB (ref 0.450–4.500)

## 2014-09-18 LAB — T3, FREE: T3, Free: 2.9 pg/mL (ref 2.0–4.4)

## 2014-09-18 LAB — T4, FREE: FREE T4: 1.44 ng/dL (ref 0.82–1.77)

## 2014-09-18 LAB — HEPATIC FUNCTION PANEL: BILIRUBIN, DIRECT: 0.19 mg/dL (ref 0.00–0.40)

## 2014-09-18 NOTE — Telephone Encounter (Signed)
-----   Message from Claretta Fraise, MD sent at 09/18/2014  2:13 PM EDT ----- Liver functions are a little high. Not enough to cause concern, but DC cholesterol pill and lets recheck LFT in 2 weeks.

## 2014-09-18 NOTE — Telephone Encounter (Signed)
Pt notified of results Verbalizes understanding 

## 2014-10-20 ENCOUNTER — Other Ambulatory Visit (INDEPENDENT_AMBULATORY_CARE_PROVIDER_SITE_OTHER): Payer: BLUE CROSS/BLUE SHIELD

## 2014-10-20 DIAGNOSIS — R7989 Other specified abnormal findings of blood chemistry: Secondary | ICD-10-CM

## 2014-10-20 DIAGNOSIS — R945 Abnormal results of liver function studies: Principal | ICD-10-CM

## 2014-10-20 NOTE — Progress Notes (Signed)
Lab only 

## 2014-10-21 LAB — HEPATIC FUNCTION PANEL
ALBUMIN: 4 g/dL (ref 3.6–4.8)
ALT: 26 IU/L (ref 0–32)
AST: 27 IU/L (ref 0–40)
Alkaline Phosphatase: 143 IU/L — ABNORMAL HIGH (ref 39–117)
Bilirubin Total: 0.6 mg/dL (ref 0.0–1.2)
Bilirubin, Direct: 0.14 mg/dL (ref 0.00–0.40)
Total Protein: 7.6 g/dL (ref 6.0–8.5)

## 2014-10-21 NOTE — Progress Notes (Signed)
Patient aware.

## 2014-11-05 ENCOUNTER — Encounter: Payer: Self-pay | Admitting: Family Medicine

## 2014-11-05 ENCOUNTER — Ambulatory Visit (INDEPENDENT_AMBULATORY_CARE_PROVIDER_SITE_OTHER): Payer: BLUE CROSS/BLUE SHIELD | Admitting: Family Medicine

## 2014-11-05 VITALS — BP 120/81 | HR 76 | Temp 98.3°F | Ht 64.0 in | Wt 201.8 lb

## 2014-11-05 DIAGNOSIS — M25551 Pain in right hip: Secondary | ICD-10-CM | POA: Diagnosis not present

## 2014-11-05 DIAGNOSIS — M7061 Trochanteric bursitis, right hip: Secondary | ICD-10-CM

## 2014-11-05 NOTE — Progress Notes (Signed)
Subjective:  Patient ID: SHAKIA SEBASTIANO, female    DOB: [birth year]  Age: 59 y.o. MRN: [id number]  CC: Hip Pain   HPI Allyah Heather Ehrmann presents for severe right hip pain. She has had injections in the past into the bursa. Today she would like to have that done again. Pain is approximately 8/10 with activity. When she gets the injection 6 reduced usually 2 a blended 2/10.  History Janell has a past medical history of Depression; Hypothyroidism; Hypertension; Hypercholesteremia; Arthritis; GERD (gastroesophageal reflux disease); and Anxiety.   She has past surgical history that includes Abdominal hysterectomy; Tubal ligation; Right Carpal Tunnel Release; Breast lumpectomy; Colonoscopy (N/A, 05/30/2012); Dilation and curettage of uterus; Cardiac catheterization; and Total knee arthroplasty (Left, 09/16/2013).   Her family history includes Dementia in her mother; Depression in her sister; Fibromyalgia in her sister; Hypertension in her mother; Lupus in her mother. There is no history of Colon cancer.She reports that she has never smoked. She has never used smokeless tobacco. She reports that she does not drink alcohol or use illicit drugs.  Outpatient Prescriptions Prior to Visit  Medication Sig Dispense Refill  . amLODipine (NORVASC) 2.5 MG tablet TAKE 1 TABLET (2.5 MG TOTAL) BY MOUTH DAILY. 90 tablet 1  . aspirin 81 MG tablet Take 81 mg by mouth daily.    Marland Kitchen atorvastatin (LIPITOR) 40 MG tablet Take 1 tablet (40 mg total) by mouth daily at 6 PM. 30 tablet 11  . benazepril-hydrochlorthiazide (LOTENSIN HCT) 20-25 MG per tablet Take 1 tablet by mouth daily. 30 tablet 11  . clorazepate (TRANXENE) 3.75 MG tablet TAKE 1 TABLET TWICE A DAY 60 tablet 1  . DULoxetine (CYMBALTA) 60 MG capsule Take 1 capsule (60 mg total) by mouth daily. 30 capsule 5  . HYDROcodone-acetaminophen (NORCO/VICODIN) 5-325 MG per tablet Take 1 tablet by mouth 2 (two) times daily as needed.  0  . levothyroxine (SYNTHROID, LEVOTHROID) 125  MCG tablet Take 1 tablet (125 mcg total) by mouth daily. 30 tablet 11  . meloxicam (MOBIC) 15 MG tablet Take 1 tablet (15 mg total) by mouth daily. 90 tablet 3  . omeprazole (PRILOSEC) 40 MG capsule TAKE 1 CAPSULE (40 MG TOTAL) BY MOUTH DAILY. 30 capsule 5   No facility-administered medications prior to visit.    ROS Review of Systems  Constitutional: Negative for fever, chills, diaphoresis, appetite change and fatigue.  HENT: Negative for congestion, ear pain, hearing loss, postnasal drip, rhinorrhea, sore throat and trouble swallowing.   Respiratory: Negative for cough, chest tightness and shortness of breath.   Cardiovascular: Negative for chest pain and palpitations.  Gastrointestinal: Negative for abdominal pain.  Musculoskeletal: Positive for myalgias, joint swelling and arthralgias.  Skin: Negative for rash.    Objective:  BP 120/81 mmHg  Pulse 76  Temp(Src) 98.3 F (36.8 C) (Oral)  Ht 5\' 4"  (1.626 m)  Wt 201 lb 12.8 oz (91.536 kg)  BMI 34.62 kg/m2  BP Readings from Last 3 Encounters:  11/05/14 120/81  09/17/14 124/82  08/01/14 117/72    Wt Readings from Last 3 Encounters:  11/05/14 201 lb 12.8 oz (91.536 kg)  09/17/14 204 lb 6.4 oz (92.715 kg)  08/01/14 199 lb 9.6 oz (90.538 kg)     Physical Exam  Constitutional: She is oriented to person, place, and time. She appears well-developed and well-nourished. No distress.  HENT:  Head: Normocephalic and atraumatic.  Eyes: Conjunctivae are normal. Pupils are equal, round, and reactive to light.  Neck: Normal range  of motion. Neck supple. No thyromegaly present.  Cardiovascular: Normal rate, regular rhythm and normal heart sounds.   No murmur heard. Pulmonary/Chest: Effort normal and breath sounds normal. No respiratory distress. She has no wheezes. She has no rales.  Abdominal: Soft. Bowel sounds are normal. She exhibits no distension. There is no tenderness.  Musculoskeletal: Normal range of motion. She exhibits  tenderness (Right greater trochanter at the bursa).  Lymphadenopathy:    She has no cervical adenopathy.  Neurological: She is alert and oriented to person, place, and time.  Skin: Skin is warm and dry.  Psychiatric: She has a normal mood and affect. Her behavior is normal. Judgment and thought content normal.    No results found for: HGBA1C  Lab Results  Component Value Date   WBC 5.9 04/18/2014   HGB 12.5 04/18/2014   HCT 41.7 04/18/2014   PLT 178 09/18/2013   GLUCOSE 79 09/17/2014   CHOL 126 04/18/2014   TRIG 64 04/18/2014   HDL 50 04/18/2014   LDLCALC 63 04/18/2014   ALT 26 10/20/2014   AST 27 10/20/2014   NA 140 09/17/2014   K 4.3 09/17/2014   CL 99 09/17/2014   CREATININE 0.90 09/17/2014   BUN 17 09/17/2014   CO2 25 09/17/2014   TSH 0.082* 09/17/2014   INR 0.93 09/05/2013    No results found.  Assessment & Plan:   Terryl was seen today for hip pain.  Diagnoses and all orders for this visit:  Trochanteric bursitis of right hip  I am having Ms. Lawless maintain her aspirin, meloxicam, HYDROcodone-acetaminophen, atorvastatin, levothyroxine, omeprazole, DULoxetine, amLODipine, clorazepate, and benazepril-hydrochlorthiazide.  No orders of the defined types were placed in this encounter.   With sterile prep and drape the right trochanteric bursa was prepped and landmarks palpated. Subsequently injected with 3 mL lidocaine with 1.5 mL Celestone. Tolerated well without any complication.  Follow-up: No Follow-up on file.  Claretta Fraise, M.D.

## 2015-01-23 ENCOUNTER — Ambulatory Visit (INDEPENDENT_AMBULATORY_CARE_PROVIDER_SITE_OTHER): Payer: BLUE CROSS/BLUE SHIELD | Admitting: Family Medicine

## 2015-01-23 ENCOUNTER — Ambulatory Visit (INDEPENDENT_AMBULATORY_CARE_PROVIDER_SITE_OTHER): Payer: BLUE CROSS/BLUE SHIELD

## 2015-01-23 ENCOUNTER — Encounter: Payer: Self-pay | Admitting: Family Medicine

## 2015-01-23 VITALS — BP 141/95 | HR 74 | Temp 97.6°F | Ht 64.0 in | Wt 202.4 lb

## 2015-01-23 DIAGNOSIS — F411 Generalized anxiety disorder: Secondary | ICD-10-CM | POA: Diagnosis not present

## 2015-01-23 DIAGNOSIS — E785 Hyperlipidemia, unspecified: Secondary | ICD-10-CM | POA: Diagnosis not present

## 2015-01-23 DIAGNOSIS — Z780 Asymptomatic menopausal state: Secondary | ICD-10-CM

## 2015-01-23 DIAGNOSIS — F418 Other specified anxiety disorders: Secondary | ICD-10-CM

## 2015-01-23 DIAGNOSIS — E039 Hypothyroidism, unspecified: Secondary | ICD-10-CM | POA: Diagnosis not present

## 2015-01-23 DIAGNOSIS — Z0000 Encounter for general adult medical examination without abnormal findings: Secondary | ICD-10-CM

## 2015-01-23 DIAGNOSIS — K219 Gastro-esophageal reflux disease without esophagitis: Secondary | ICD-10-CM | POA: Diagnosis not present

## 2015-01-23 DIAGNOSIS — R87610 Atypical squamous cells of undetermined significance on cytologic smear of cervix (ASC-US): Secondary | ICD-10-CM

## 2015-01-23 DIAGNOSIS — Z1212 Encounter for screening for malignant neoplasm of rectum: Secondary | ICD-10-CM

## 2015-01-23 DIAGNOSIS — M1712 Unilateral primary osteoarthritis, left knee: Secondary | ICD-10-CM | POA: Diagnosis not present

## 2015-01-23 DIAGNOSIS — I10 Essential (primary) hypertension: Secondary | ICD-10-CM | POA: Diagnosis not present

## 2015-01-23 LAB — POCT URINALYSIS DIPSTICK
Bilirubin, UA: NEGATIVE
GLUCOSE UA: NEGATIVE
Ketones, UA: NEGATIVE
Leukocytes, UA: NEGATIVE
Nitrite, UA: NEGATIVE
Protein, UA: NEGATIVE
RBC UA: NEGATIVE
SPEC GRAV UA: 1.02
UROBILINOGEN UA: NEGATIVE
pH, UA: 6

## 2015-01-23 MED ORDER — DULOXETINE HCL 40 MG PO CPEP: ORAL_CAPSULE | ORAL | Status: DC

## 2015-01-23 NOTE — Progress Notes (Signed)
Subjective:  Patient ID: Leslie Robbins, female    DOB: November 10, 1955  Age: 59 y.o. MRN: [id number]  CC: Annual Exam and Pruritis   HPI Leslie Robbins presents for as above. BP high every morning. Husband feels she is paranoid and irritable at home states the patient. Additionally, workers have said that she is more easily flustered lately.  Patient has had chronic eruption on the skin primarily on the extremities but some on the abdomen as well. Seems to be worst around elbows and knees. This has been chronic. She uses over-the-counter moisturizers. Mixed success. Seems to be worse as the weather cools off.   follow-up of hypertension. Patient has no history of headache chest pain or shortness of breath or recent cough. Patient also denies symptoms of TIA such as numbness weakness lateralizing. Patient checks  blood pressure at home and has not had any elevated readings recently. Patient denies side effects from his medication. States taking it regularly.  Patient in for follow-up of elevated cholesterol. Doing well without complaints on current medication. Denies side effects of statin including myalgia and arthralgia and nausea. Also in today for liver function testing. Currently no chest pain, shortness of breath or other cardiovascular related symptoms noted.  Patient presents for follow-up on  thyroid. She has a history of hypothyroidism for many years. It has been stable recently. Pt. denies any change in  voice, loss of hair, heat or cold intolerance. Energy level has been adequate to good. She denies constipation and diarrhea. No myxedema. Medication is as noted below. Verified that pt is taking it daily on an empty stomach. Well tolerated.  History Leslie Robbins has a past medical history of Depression; Hypothyroidism; Hypertension; Hypercholesteremia; Arthritis; GERD (gastroesophageal reflux disease); and Anxiety.   She has past surgical history that includes Abdominal hysterectomy; Tubal ligation;  Right Carpal Tunnel Release; Breast lumpectomy; Colonoscopy (N/A, 05/30/2012); Dilation and curettage of uterus; Cardiac catheterization; and Total knee arthroplasty (Left, 09/16/2013).   Her family history includes Dementia in her mother; Depression in her sister; Fibromyalgia in her sister; Hypertension in her mother; Lupus in her mother. There is no history of Colon cancer.She reports that she has never smoked. She has never used smokeless tobacco. She reports that she does not drink alcohol or use illicit drugs.  Outpatient Prescriptions Prior to Visit  Medication Sig Dispense Refill  . aspirin 81 MG tablet Take 81 mg by mouth daily.    . benazepril-hydrochlorthiazide (LOTENSIN HCT) 20-25 MG per tablet Take 1 tablet by mouth daily. 30 tablet 11  . clorazepate (TRANXENE) 3.75 MG tablet TAKE 1 TABLET TWICE A DAY 60 tablet 1  . meloxicam (MOBIC) 15 MG tablet Take 1 tablet (15 mg total) by mouth daily. 90 tablet 3  . amLODipine (NORVASC) 2.5 MG tablet TAKE 1 TABLET (2.5 MG TOTAL) BY MOUTH DAILY. 90 tablet 1  . atorvastatin (LIPITOR) 40 MG tablet Take 1 tablet (40 mg total) by mouth daily at 6 PM. 30 tablet 11  . DULoxetine (CYMBALTA) 60 MG capsule Take 1 capsule (60 mg total) by mouth daily. 30 capsule 5  . levothyroxine (SYNTHROID, LEVOTHROID) 125 MCG tablet Take 1 tablet (125 mcg total) by mouth daily. 30 tablet 11  . omeprazole (PRILOSEC) 40 MG capsule TAKE 1 CAPSULE (40 MG TOTAL) BY MOUTH DAILY. 30 capsule 5  . HYDROcodone-acetaminophen (NORCO/VICODIN) 5-325 MG per tablet Take 1 tablet by mouth 2 (two) times daily as needed.  0   No facility-administered medications prior to visit.  ROS Review of Systems  Constitutional: Negative for fever, chills, diaphoresis, appetite change, fatigue and unexpected weight change.  HENT: Negative for congestion, ear pain, hearing loss, postnasal drip, rhinorrhea, sneezing, sore throat and trouble swallowing.   Eyes: Negative for pain.  Respiratory:  Negative for cough, chest tightness and shortness of breath.   Cardiovascular: Negative for chest pain and palpitations.  Gastrointestinal: Negative for nausea, vomiting, abdominal pain, diarrhea and constipation.  Endocrine: Negative for cold intolerance, heat intolerance, polydipsia, polyphagia and polyuria.  Genitourinary: Negative for dysuria, frequency, decreased urine volume, difficulty urinating, vaginal pain, menstrual problem and pelvic pain.  Musculoskeletal: Negative for myalgias, back pain, joint swelling, arthralgias and neck pain.  Skin: Negative for color change and rash.       Skin is dry over extremities throughout. Some scaliness noted. Some eczematous eruption over the flexor surface of the elbows and knees and upper arms  Allergic/Immunologic: Negative for environmental allergies and food allergies.  Neurological: Negative for dizziness, tremors, syncope, speech difficulty, weakness, numbness and headaches.  Hematological: Does not bruise/bleed easily.  Psychiatric/Behavioral: Positive for behavioral problems and dysphoric mood. Negative for suicidal ideas, confusion, self-injury, decreased concentration and agitation. The patient is nervous/anxious.     Objective:  BP 141/95 mmHg  Pulse 74  Temp(Src) 97.6 F (36.4 C) (Oral)  Ht 5\' 4"  (1.626 m)  Wt 202 lb 6.4 oz (91.808 kg)  BMI 34.72 kg/m2  SpO2 100%  BP Readings from Last 3 Encounters:  01/23/15 141/95  11/05/14 120/81  09/17/14 124/82    Wt Readings from Last 3 Encounters:  01/23/15 202 lb 6.4 oz (91.808 kg)  11/05/14 201 lb 12.8 oz (91.536 kg)  09/17/14 204 lb 6.4 oz (92.715 kg)     Physical Exam  Constitutional: She is oriented to person, place, and time. She appears well-developed and well-nourished. No distress.  HENT:  Head: Normocephalic and atraumatic.  Right Ear: External ear normal.  Left Ear: External ear normal.  Nose: Nose normal.  Mouth/Throat: Oropharynx is clear and moist.  Eyes:  Conjunctivae and EOM are normal. Pupils are equal, round, and reactive to light.  Neck: Normal range of motion. Neck supple. No thyromegaly present.  Cardiovascular: Normal rate, regular rhythm and normal heart sounds.   No murmur heard. Pulmonary/Chest: Effort normal and breath sounds normal. No respiratory distress. She has no wheezes. She has no rales. Right breast exhibits no inverted nipple, no mass and no tenderness. Left breast exhibits no inverted nipple, no mass and no tenderness. Breasts are symmetrical.  Abdominal: Soft. Normal appearance and bowel sounds are normal. She exhibits no distension, no abdominal bruit and no mass. There is no splenomegaly or hepatomegaly. There is no tenderness. There is no tenderness at McBurney's point and negative Murphy's sign.  Genitourinary: Rectum normal, vagina normal and uterus normal. Rectal exam shows no external hemorrhoid, no internal hemorrhoid, no mass and no tenderness. Cervix exhibits no discharge and no friability. Right adnexum displays no mass and no tenderness. Left adnexum displays no mass and no tenderness.  Stool IFOB obtained  Lymphadenopathy:    She has no cervical adenopathy.  Neurological: She is alert and oriented to person, place, and time. She has normal reflexes.  Skin: Skin is warm and dry. No abrasion and no bruising noted. Rash is not maculopapular. She is not diaphoretic. No cyanosis. Nails show no clubbing.     Psychiatric: She has a normal mood and affect. Her behavior is normal. Judgment and thought content normal.  No results found for: HGBA1C  Lab Results  Component Value Date   WBC 4.6 01/23/2015   HGB 12.5 04/18/2014   HCT 41.8 01/23/2015   PLT 178 09/18/2013   GLUCOSE 79 01/23/2015   CHOL 152 01/23/2015   TRIG 82 01/23/2015   HDL 55 01/23/2015   LDLCALC 63 04/18/2014   ALT 165* 01/23/2015   AST 132* 01/23/2015   NA 140 01/23/2015   K 4.1 01/23/2015   CL 101 01/23/2015   CREATININE 0.92 01/23/2015     BUN 24 01/23/2015   CO2 25 01/23/2015   TSH 0.015* 01/23/2015   INR 0.93 09/05/2013    No results found.  Assessment & Plan:   Evanna was seen today for annual exam and pruritis.  Diagnoses and all orders for this visit:  Well adult exam -     CBC with Differential/Platelet -     CMP14+EGFR -     POCT urinalysis dipstick -     Pap IG w/ reflex to HPV when ASC-U  Hypothyroidism, unspecified hypothyroidism type -     CBC with Differential/Platelet -     CMP14+EGFR -     POCT urinalysis dipstick -     TSH -     T4, Free -     Thyroid Panel With TSH; Future  Hyperlipidemia with target LDL less than 100 -     CBC with Differential/Platelet -     CMP14+EGFR -     NMR, lipoprofile -     POCT urinalysis dipstick  Gastroesophageal reflux disease without esophagitis -     CBC with Differential/Platelet -     CMP14+EGFR -     POCT urinalysis dipstick  GAD (generalized anxiety disorder) -     CBC with Differential/Platelet -     CMP14+EGFR -     POCT urinalysis dipstick  Essential hypertension -     CBC with Differential/Platelet -     CMP14+EGFR -     POCT urinalysis dipstick  Depression with anxiety -     CBC with Differential/Platelet -     CMP14+EGFR -     POCT urinalysis dipstick  Primary osteoarthritis of left knee -     CBC with Differential/Platelet -     CMP14+EGFR -     POCT urinalysis dipstick -     VITAMIN D 25 Hydroxy (Vit-D Deficiency, Fractures)  Postmenopausal -     DG Bone Density -     Pap IG w/ reflex to HPV when ASC-U  Atypical squamous cells of undetermined significance on cytologic smear of cervix (ASC-US) -     Pap IG w/ reflex to HPV when ASC-U  Screening for malignant neoplasm of the rectum -     Fecal occult blood, imunochemical -     Ambulatory referral to Gastroenterology  Other orders -     DULoxetine 40 MG CPEP; Take two tablets every evening at the end of supper.   I have discontinued Ms. Alvarenga's HYDROcodone-acetaminophen.  I have also changed her DULoxetine to DULoxetine HCl. Additionally, I am having her maintain her aspirin, meloxicam, clorazepate, and benazepril-hydrochlorthiazide.  Meds ordered this encounter  Medications  . DULoxetine 40 MG CPEP    Sig: Take two tablets every evening at the end of supper.    Dispense:  60 capsule    Refill:  2     Guidance/counseling:  for eczema:Use cetaphil lotion twice daily.  Warm humidifier at bedside.  Bathe in warm not hot  water. Use soap at the very end. Hulan Fray is best. Rinse briefly in the shower. Zyrtec 10 mg tab at bedtime as needed.  Patient was counseled on appropriate diet. Low carbohydrate, low sodium, high protein approach. Regular exercise benefit with mix of cardio and resistance training was reviewed.  Reminded to wear seat belt when driving or a passenger. Alcohol tobacco and drug reminders given  Follow-up: Return in about 6 months (around 07/23/2015), or if symptoms worsen or fail to improve.  Claretta Fraise, M.D.

## 2015-01-23 NOTE — Patient Instructions (Signed)
Use cetaphil lotion twice daily.  Warm humidifier at bedside.  Bathe in warm not hot water. Use soap at the very end. Hulan Fray is best. Rinse briefly in the shower. Zyrtec 10 mg tab at bedtime as needed.

## 2015-01-24 ENCOUNTER — Other Ambulatory Visit: Payer: Self-pay | Admitting: Family Medicine

## 2015-01-24 LAB — CBC WITH DIFFERENTIAL/PLATELET
BASOS: 1 %
Basophils Absolute: 0.1 10*3/uL (ref 0.0–0.2)
EOS (ABSOLUTE): 0.1 10*3/uL (ref 0.0–0.4)
EOS: 3 %
HEMATOCRIT: 41.8 % (ref 34.0–46.6)
Hemoglobin: 13.7 g/dL (ref 11.1–15.9)
Immature Grans (Abs): 0 10*3/uL (ref 0.0–0.1)
Immature Granulocytes: 0 %
LYMPHS ABS: 1.4 10*3/uL (ref 0.7–3.1)
Lymphs: 31 %
MCH: 28.4 pg (ref 26.6–33.0)
MCHC: 32.8 g/dL (ref 31.5–35.7)
MCV: 87 fL (ref 79–97)
MONOCYTES: 6 %
Monocytes Absolute: 0.3 10*3/uL (ref 0.1–0.9)
Neutrophils Absolute: 2.7 10*3/uL (ref 1.4–7.0)
Neutrophils: 59 %
Platelets: 221 10*3/uL (ref 150–379)
RBC: 4.82 x10E6/uL (ref 3.77–5.28)
RDW: 15.7 % — AB (ref 12.3–15.4)
WBC: 4.6 10*3/uL (ref 3.4–10.8)

## 2015-01-24 LAB — CMP14+EGFR
A/G RATIO: 1.4 (ref 1.1–2.5)
ALT: 165 IU/L — ABNORMAL HIGH (ref 0–32)
AST: 132 IU/L — AB (ref 0–40)
Albumin: 4.2 g/dL (ref 3.6–4.8)
Alkaline Phosphatase: 203 IU/L — ABNORMAL HIGH (ref 39–117)
BILIRUBIN TOTAL: 1.2 mg/dL (ref 0.0–1.2)
BUN/Creatinine Ratio: 26 (ref 11–26)
BUN: 24 mg/dL (ref 8–27)
CO2: 25 mmol/L (ref 18–29)
CREATININE: 0.92 mg/dL (ref 0.57–1.00)
Calcium: 9.8 mg/dL (ref 8.7–10.3)
Chloride: 101 mmol/L (ref 97–106)
GFR calc Af Amer: 78 mL/min/{1.73_m2} (ref >59)
GFR calc non Af Amer: 67 mL/min/{1.73_m2} (ref >59)
GLOBULIN, TOTAL: 3.1 g/dL (ref 1.5–4.5)
Glucose: 79 mg/dL (ref 65–99)
POTASSIUM: 4.1 mmol/L (ref 3.5–5.2)
SODIUM: 140 mmol/L (ref 136–144)
Total Protein: 7.3 g/dL (ref 6.0–8.5)

## 2015-01-24 LAB — NMR, LIPOPROFILE
Cholesterol: 152 mg/dL (ref 100–199)
HDL Cholesterol by NMR: 55 mg/dL (ref >39)
HDL Particle Number: 27 umol/L — ABNORMAL LOW (ref >=30.5)
LDL PARTICLE NUMBER: 991 nmol/L (ref <1000)
LDL Size: 21.5 nm (ref >20.5)
LDL-C: 81 mg/dL (ref 0–99)
Small LDL Particle Number: 194 nmol/L (ref <=527)
Triglycerides by NMR: 82 mg/dL (ref 0–149)

## 2015-01-24 LAB — VITAMIN D 25 HYDROXY (VIT D DEFICIENCY, FRACTURES): VIT D 25 HYDROXY: 23.2 ng/mL — AB (ref 30.0–100.0)

## 2015-01-24 LAB — TSH: TSH: 0.015 u[IU]/mL — ABNORMAL LOW (ref 0.450–4.500)

## 2015-01-24 LAB — T4, FREE: Free T4: 1.76 ng/dL (ref 0.82–1.77)

## 2015-01-24 MED ORDER — VITAMIN D (ERGOCALCIFEROL) 1.25 MG (50000 UNIT) PO CAPS: 50000.0000 [IU] | ORAL_CAPSULE | ORAL | Status: DC

## 2015-01-24 MED ORDER — LEVOTHYROXINE SODIUM 112 MCG PO TABS: 112.0000 ug | ORAL_TABLET | Freq: Every day | ORAL | Status: DC

## 2015-01-27 ENCOUNTER — Other Ambulatory Visit: Payer: Self-pay | Admitting: Family Medicine

## 2015-01-27 LAB — PAP IG W/ RFLX HPV ASCU: PAP Smear Comment: 0

## 2015-01-27 NOTE — Progress Notes (Signed)
Quick Note:  Your recent Pap smear performed in the office came back normal. ______

## 2015-01-28 LAB — FECAL OCCULT BLOOD, IMMUNOCHEMICAL: FECAL OCCULT BLD: POSITIVE — AB

## 2015-01-30 ENCOUNTER — Other Ambulatory Visit: Payer: Self-pay | Admitting: Family Medicine

## 2015-02-03 ENCOUNTER — Encounter: Payer: BLUE CROSS/BLUE SHIELD | Admitting: Family Medicine

## 2015-02-16 ENCOUNTER — Telehealth: Payer: Self-pay | Admitting: *Deleted

## 2015-02-16 NOTE — Telephone Encounter (Signed)
Patient aware.

## 2015-02-16 NOTE — Telephone Encounter (Signed)
When she was seen last by Dr. Livia Snellen she was referred for a colonoscopy- but she had one 05/2012 by Dr. Laural Golden in [place].  Last colonoscopy revealed 2 small polyps and a few diverticula.  Patient is wondering if she needs to proceed with having another colonoscopy at this time. Patient states she does have intermittent abdominal pain.

## 2015-02-16 NOTE — Telephone Encounter (Signed)
Her colonoscopy is up-to-date. She does not need one until 2024.

## 2015-03-06 LAB — HM MAMMOGRAPHY: HM MAMMO: NEGATIVE

## 2015-03-12 ENCOUNTER — Encounter: Payer: Self-pay | Admitting: *Deleted

## 2015-03-27 ENCOUNTER — Other Ambulatory Visit: Payer: Self-pay | Admitting: *Deleted

## 2015-03-27 ENCOUNTER — Other Ambulatory Visit: Payer: Self-pay

## 2015-03-27 MED ORDER — LEVOTHYROXINE SODIUM 112 MCG PO TABS: 112.0000 ug | ORAL_TABLET | Freq: Every day | ORAL | Status: DC

## 2015-03-27 MED ORDER — BENAZEPRIL-HYDROCHLOROTHIAZIDE 20-25 MG PO TABS: 1.0000 | ORAL_TABLET | Freq: Every day | ORAL | Status: DC

## 2015-04-04 ENCOUNTER — Other Ambulatory Visit: Payer: Self-pay | Admitting: Family Medicine

## 2015-04-06 ENCOUNTER — Other Ambulatory Visit: Payer: BLUE CROSS/BLUE SHIELD

## 2015-04-06 DIAGNOSIS — E039 Hypothyroidism, unspecified: Secondary | ICD-10-CM

## 2015-04-06 DIAGNOSIS — R945 Abnormal results of liver function studies: Principal | ICD-10-CM

## 2015-04-06 DIAGNOSIS — R7989 Other specified abnormal findings of blood chemistry: Secondary | ICD-10-CM

## 2015-04-06 NOTE — Telephone Encounter (Signed)
Last seen 01/23/15  Dr Livia Snellen

## 2015-04-06 NOTE — Telephone Encounter (Signed)
RX for clorazepate called into [facility] Okayed per Dr Livia Snellen

## 2015-04-06 NOTE — Telephone Encounter (Signed)
Please review and advise.

## 2015-04-07 LAB — CMP14+EGFR
ALK PHOS: 116 IU/L (ref 39–117)
ALT: 19 IU/L (ref 0–32)
AST: 25 IU/L (ref 0–40)
Albumin/Globulin Ratio: 1.1 (ref 1.1–2.5)
Albumin: 3.7 g/dL (ref 3.6–4.8)
BUN/Creatinine Ratio: 17 (ref 11–26)
BUN: 17 mg/dL (ref 8–27)
Bilirubin Total: 0.5 mg/dL (ref 0.0–1.2)
CALCIUM: 9.9 mg/dL (ref 8.7–10.3)
CO2: 25 mmol/L (ref 18–29)
CREATININE: 1 mg/dL (ref 0.57–1.00)
Chloride: 103 mmol/L (ref 96–106)
GFR calc Af Amer: 70 mL/min/{1.73_m2} (ref >59)
GFR, EST NON AFRICAN AMERICAN: 61 mL/min/{1.73_m2} (ref >59)
GLOBULIN, TOTAL: 3.3 g/dL (ref 1.5–4.5)
GLUCOSE: 89 mg/dL (ref 65–99)
Potassium: 4.4 mmol/L (ref 3.5–5.2)
SODIUM: 142 mmol/L (ref 134–144)
Total Protein: 7 g/dL (ref 6.0–8.5)

## 2015-04-07 LAB — THYROID PANEL WITH TSH
Free Thyroxine Index: 2.6 (ref 1.2–4.9)
T3 UPTAKE RATIO: 27 % (ref 24–39)
T4 TOTAL: 9.5 ug/dL (ref 4.5–12.0)
TSH: 0.061 u[IU]/mL — ABNORMAL LOW (ref 0.450–4.500)

## 2015-04-09 ENCOUNTER — Telehealth: Payer: Self-pay | Admitting: Family Medicine

## 2015-04-09 NOTE — Telephone Encounter (Signed)
Stp and reviewed results.

## 2015-04-25 ENCOUNTER — Other Ambulatory Visit: Payer: Self-pay | Admitting: Family Medicine

## 2015-04-29 ENCOUNTER — Other Ambulatory Visit: Payer: Self-pay | Admitting: Family Medicine

## 2015-05-26 ENCOUNTER — Other Ambulatory Visit: Payer: Self-pay | Admitting: Family Medicine

## 2015-06-14 ENCOUNTER — Other Ambulatory Visit: Payer: Self-pay | Admitting: Family Medicine

## 2015-06-23 DIAGNOSIS — H1013 Acute atopic conjunctivitis, bilateral: Secondary | ICD-10-CM | POA: Diagnosis not present

## 2015-07-07 ENCOUNTER — Other Ambulatory Visit: Payer: Self-pay | Admitting: Family Medicine

## 2015-07-21 ENCOUNTER — Other Ambulatory Visit: Payer: Self-pay

## 2015-07-21 MED ORDER — OMEPRAZOLE 40 MG PO CPDR: DELAYED_RELEASE_CAPSULE | ORAL | Status: DC

## 2015-07-21 NOTE — Telephone Encounter (Signed)
Last seen 01/23/15  Dr Livia Snellen  Requesting 90 day supply

## 2015-07-23 ENCOUNTER — Other Ambulatory Visit: Payer: Self-pay | Admitting: Family Medicine

## 2015-07-23 ENCOUNTER — Ambulatory Visit: Payer: BLUE CROSS/BLUE SHIELD | Admitting: Family Medicine

## 2015-07-24 ENCOUNTER — Encounter: Payer: Self-pay | Admitting: Family Medicine

## 2015-07-26 ENCOUNTER — Encounter: Payer: Self-pay | Admitting: Family Medicine

## 2015-07-27 ENCOUNTER — Encounter: Payer: Self-pay | Admitting: Family Medicine

## 2015-07-27 ENCOUNTER — Ambulatory Visit (INDEPENDENT_AMBULATORY_CARE_PROVIDER_SITE_OTHER): Payer: BLUE CROSS/BLUE SHIELD | Admitting: Family Medicine

## 2015-07-27 VITALS — BP 118/81 | HR 75 | Temp 97.7°F | Ht 64.0 in | Wt 198.4 lb

## 2015-07-27 DIAGNOSIS — M7070 Other bursitis of hip, unspecified hip: Secondary | ICD-10-CM | POA: Insufficient documentation

## 2015-07-27 DIAGNOSIS — M7071 Other bursitis of hip, right hip: Secondary | ICD-10-CM | POA: Diagnosis not present

## 2015-07-27 DIAGNOSIS — I10 Essential (primary) hypertension: Secondary | ICD-10-CM | POA: Diagnosis not present

## 2015-07-27 DIAGNOSIS — F418 Other specified anxiety disorders: Secondary | ICD-10-CM | POA: Diagnosis not present

## 2015-07-27 DIAGNOSIS — E785 Hyperlipidemia, unspecified: Secondary | ICD-10-CM

## 2015-07-27 DIAGNOSIS — E039 Hypothyroidism, unspecified: Secondary | ICD-10-CM

## 2015-07-27 MED ORDER — BETAMETHASONE SOD PHOS & ACET 6 (3-3) MG/ML IJ SUSP
6.0000 mg | Freq: Once | INTRAMUSCULAR | Status: AC
  Administered 2015-07-27: 6 mg via INTRAMUSCULAR

## 2015-07-27 MED ORDER — DULOXETINE HCL 30 MG PO CPEP: 90.0000 mg | ORAL_CAPSULE | Freq: Every day | ORAL | Status: DC

## 2015-07-27 NOTE — Progress Notes (Signed)
Subjective:  Patient ID: Leslie Robbins, female    DOB: 12/05/1955  Age: 60 y.o. MRN: [id number]  CC: Hypertension; Hypothyroidism; Gastroesophageal Reflux; and Hip Pain   HPI [name] presents for  follow-up of hypertension. Patient has no history of headache chest pain or shortness of breath or recent cough. Patient also denies symptoms of TIA such as numbness weakness lateralizing. Patient checks  blood pressure at home and has not had any elevated readings recently. Patient denies side effects from his medication. States taking it regularly.  Patient also  in for follow-up of elevated cholesterol.Off statin for 6 months due to elevation of liver enzymes. Also in today for liver function testing. Currently no chest pain, shortness of breath or other cardiovascular related symptoms noted.  Patient presents for follow-up on  thyroid. The patient has a history of hypothyroidism for many years. It has been stable recently. Pt. denies any change in  voice, loss of hair, heat or cold intolerance. Energy level has been adequate to good. Patient denies constipation and diarrhea. No myxedema. Medication is as noted below. Verified that pt is taking it daily on an empty stomach. Well tolerated.  Patient relates that this hip has started hurting again recently. Orthopedics confirmed bursitis. She would like to have an injection today for that.  Patient also reports that her daughter has been in rehabilitation and is hooked on drugs. She is moved her daughter in to her home with the 2 kids temporarily she's helping raise the 2 grandkids while she is helping her daughter get clean. The son-in-law currently is in drug rehabilitation. There is some debate about whether she should go back to him when he is through with rehabilitation since this is at least the third trip to rehabilitation for him.  History Merideth has a past medical history of Depression; Hypothyroidism; Hypertension; Hypercholesteremia;  Arthritis; GERD (gastroesophageal reflux disease); and Anxiety.   She has past surgical history that includes Abdominal hysterectomy; Tubal ligation; Right Carpal Tunnel Release; Breast lumpectomy; Colonoscopy (N/A, 05/30/2012); Dilation and curettage of uterus; Cardiac catheterization; and Total knee arthroplasty (Left, 09/16/2013).   Her family history includes Dementia in her mother; Depression in her sister; Fibromyalgia in her sister; Hypertension in her mother; Lupus in her mother. There is no history of Colon cancer.She reports that she has never smoked. She has never used smokeless tobacco. She reports that she does not drink alcohol or use illicit drugs.  Current Outpatient Prescriptions on File Prior to Visit  Medication Sig Dispense Refill  . amLODipine (NORVASC) 2.5 MG tablet TAKE 1 TABLET (2.5 MG TOTAL) BY MOUTH DAILY. 90 tablet 0  . benazepril-hydrochlorthiazide (LOTENSIN HCT) 20-25 MG tablet TAKE 1 TABLET BY MOUTH DAILY. 90 tablet 0  . clorazepate (TRANXENE) 3.75 MG tablet TAKE 1 TABLET TWICE A DAY 60 tablet 1  . levothyroxine (SYNTHROID, LEVOTHROID) 112 MCG tablet TAKE 1 TABLET (112 MCG TOTAL) BY MOUTH DAILY. 30 tablet 10  . meloxicam (MOBIC) 15 MG tablet Take 1 tablet (15 mg total) by mouth daily. 90 tablet 3  . omeprazole (PRILOSEC) 40 MG capsule TAKE 1 CAPSULE (40 MG TOTAL) BY MOUTH DAILY. 90 capsule 0  . aspirin 81 MG tablet Take 81 mg by mouth daily. Reported on 07/27/2015     No current facility-administered medications on file prior to visit.    ROS Review of Systems  Constitutional: Negative for fever, activity change and appetite change.  HENT: Negative for congestion, rhinorrhea and sore throat.  Eyes: Negative for visual disturbance.  Respiratory: Negative for cough and shortness of breath.   Cardiovascular: Negative for chest pain and palpitations.  Gastrointestinal: Negative for nausea, abdominal pain and diarrhea.  Genitourinary: Negative for dysuria.    Musculoskeletal: Negative for myalgias and arthralgias.  Psychiatric/Behavioral: Positive for dysphoric mood. The patient is nervous/anxious.     Objective:  BP 118/81 mmHg  Pulse 75  Temp(Src) 97.7 F (36.5 C) (Oral)  Ht '5\' 4"'$  (1.626 m)  Wt 198 lb 6.4 oz (89.994 kg)  BMI 34.04 kg/m2  SpO2 97%  BP Readings from Last 3 Encounters:  07/27/15 118/81  01/23/15 141/95  11/05/14 120/81    Wt Readings from Last 3 Encounters:  07/27/15 198 lb 6.4 oz (89.994 kg)  01/23/15 202 lb 6.4 oz (91.808 kg)  11/05/14 201 lb 12.8 oz (91.536 kg)     Physical Exam  Constitutional: She is oriented to person, place, and time. She appears well-developed and well-nourished. No distress.  HENT:  Head: Normocephalic and atraumatic.  Right Ear: External ear normal.  Left Ear: External ear normal.  Nose: Nose normal.  Mouth/Throat: Oropharynx is clear and moist.  Eyes: Conjunctivae and EOM are normal. Pupils are equal, round, and reactive to light.  Neck: Normal range of motion. Neck supple. No thyromegaly present.  Cardiovascular: Normal rate, regular rhythm and normal heart sounds.   No murmur heard. Pulmonary/Chest: Effort normal and breath sounds normal. No respiratory distress. She has no wheezes. She has no rales.  Abdominal: Soft. Bowel sounds are normal. She exhibits no distension. There is no tenderness.  Lymphadenopathy:    She has no cervical adenopathy.  Neurological: She is alert and oriented to person, place, and time. She has normal reflexes.  Skin: Skin is warm and dry.  Psychiatric: She has a normal mood and affect. Her behavior is normal. Judgment and thought content normal.    No results found for: HGBA1C  Lab Results  Component Value Date   WBC 4.6 01/23/2015   HGB 12.5 04/18/2014   HCT 41.8 01/23/2015   PLT 221 01/23/2015   GLUCOSE 89 04/06/2015   CHOL 152 01/23/2015   TRIG 82 01/23/2015   HDL 55 01/23/2015   LDLCALC 63 04/18/2014   ALT 19 04/06/2015   AST 25  04/06/2015   NA 142 04/06/2015   K 4.4 04/06/2015   CL 103 04/06/2015   CREATININE 1.00 04/06/2015   BUN 17 04/06/2015   CO2 25 04/06/2015   TSH 0.061* 04/06/2015   INR 0.93 09/05/2013    No results found.  Assessment & Plan:   Shanteria was seen today for hypertension, hypothyroidism, gastroesophageal reflux and hip pain.  Diagnoses and all orders for this visit:  Essential hypertension -     CBC with Differential/Platelet -     CMP14+EGFR  Hyperlipidemia with target LDL less than 100 -     CBC with Differential/Platelet -     CMP14+EGFR -     Lipid panel  Hypothyroidism, unspecified hypothyroidism type -     CBC with Differential/Platelet -     CMP14+EGFR -     TSH + free T4  Depression with anxiety -     CBC with Differential/Platelet -     CMP14+EGFR  Bursitis, hip, right -     CBC with Differential/Platelet -     CMP14+EGFR -     betamethasone acetate-betamethasone sodium phosphate (CELESTONE) injection 6 mg; Inject 1 mL (6 mg total) into the muscle once.  Other orders -     DULoxetine (CYMBALTA) 30 MG capsule; Take 3 capsules (90 mg total) by mouth daily.  I have discontinued Ms. Caywood's DULoxetine HCl, Vitamin D (Ergocalciferol), DULoxetine, and DULoxetine. I am also having her start on DULoxetine. Additionally, I am having her maintain her aspirin, meloxicam, clorazepate, levothyroxine, benazepril-hydrochlorthiazide, omeprazole, amLODipine, RESTASIS MULTIDOSE, and PAZEO. We will continue to administer betamethasone acetate-betamethasone sodium phosphate.  Meds ordered this encounter  Medications  . RESTASIS MULTIDOSE 0.05 % ophthalmic emulsion    Sig: INSTILL 1 DROP INTO EACH EYE TWICE DAILY    Refill:  3  . PAZEO 0.7 % SOLN    Sig: INTO ONE DROP INTO EACH EYE EVERY MORNING    Refill:  3  . DULoxetine (CYMBALTA) 30 MG capsule    Sig: Take 3 capsules (90 mg total) by mouth daily.    Dispense:  90 capsule    Refill:  5  . betamethasone acetate-betamethasone  sodium phosphate (CELESTONE) injection 6 mg    Sig:      Follow-up: Return in about 1 month (around 08/27/2015) for Depression, cholesterol.  Claretta Fraise, M.D.

## 2015-07-28 LAB — CMP14+EGFR
ALBUMIN: 4.4 g/dL (ref 3.6–4.8)
ALK PHOS: 119 IU/L — AB (ref 39–117)
ALT: 17 IU/L (ref 0–32)
AST: 19 IU/L (ref 0–40)
Albumin/Globulin Ratio: 1.6 (ref 1.2–2.2)
BUN / CREAT RATIO: 20 (ref 12–28)
BUN: 18 mg/dL (ref 8–27)
Bilirubin Total: 0.5 mg/dL (ref 0.0–1.2)
CO2: 24 mmol/L (ref 18–29)
CREATININE: 0.91 mg/dL (ref 0.57–1.00)
Calcium: 9.8 mg/dL (ref 8.7–10.3)
Chloride: 103 mmol/L (ref 96–106)
GFR calc Af Amer: 78 mL/min/{1.73_m2} (ref >59)
GFR calc non Af Amer: 68 mL/min/{1.73_m2} (ref >59)
GLUCOSE: 82 mg/dL (ref 65–99)
Globulin, Total: 2.7 g/dL (ref 1.5–4.5)
Potassium: 4.2 mmol/L (ref 3.5–5.2)
Sodium: 145 mmol/L — ABNORMAL HIGH (ref 134–144)
Total Protein: 7.1 g/dL (ref 6.0–8.5)

## 2015-07-28 LAB — LIPID PANEL
CHOLESTEROL TOTAL: 193 mg/dL (ref 100–199)
Chol/HDL Ratio: 3.6 ratio units (ref 0.0–4.4)
HDL: 54 mg/dL (ref >39)
LDL Calculated: 108 mg/dL — ABNORMAL HIGH (ref 0–99)
TRIGLYCERIDES: 157 mg/dL — AB (ref 0–149)
VLDL Cholesterol Cal: 31 mg/dL (ref 5–40)

## 2015-07-28 LAB — CBC WITH DIFFERENTIAL/PLATELET
BASOS: 1 %
Basophils Absolute: 0 10*3/uL (ref 0.0–0.2)
EOS (ABSOLUTE): 0.2 10*3/uL (ref 0.0–0.4)
EOS: 5 %
HEMATOCRIT: 43.6 % (ref 34.0–46.6)
Hemoglobin: 13.6 g/dL (ref 11.1–15.9)
Immature Grans (Abs): 0 10*3/uL (ref 0.0–0.1)
Immature Granulocytes: 0 %
LYMPHS ABS: 1.4 10*3/uL (ref 0.7–3.1)
Lymphs: 31 %
MCH: 27.4 pg (ref 26.6–33.0)
MCHC: 31.2 g/dL — AB (ref 31.5–35.7)
MCV: 88 fL (ref 79–97)
MONOS ABS: 0.3 10*3/uL (ref 0.1–0.9)
Monocytes: 6 %
NEUTROS ABS: 2.7 10*3/uL (ref 1.4–7.0)
NEUTROS PCT: 57 %
PLATELETS: 237 10*3/uL (ref 150–379)
RBC: 4.97 x10E6/uL (ref 3.77–5.28)
RDW: 14.6 % (ref 12.3–15.4)
WBC: 4.7 10*3/uL (ref 3.4–10.8)

## 2015-07-28 LAB — TSH+FREE T4
FREE T4: 1.27 ng/dL (ref 0.82–1.77)
TSH: 0.231 u[IU]/mL — AB (ref 0.450–4.500)

## 2015-07-29 ENCOUNTER — Telehealth: Payer: Self-pay | Admitting: Family Medicine

## 2015-07-29 NOTE — Telephone Encounter (Signed)
Spoke with pt and she had already looked on MyChart and doesn't have any questions.

## 2015-08-06 NOTE — Telephone Encounter (Signed)
Left message for pt to rc

## 2015-08-24 ENCOUNTER — Other Ambulatory Visit: Payer: Self-pay | Admitting: *Deleted

## 2015-08-24 MED ORDER — MELOXICAM 15 MG PO TABS: 15.0000 mg | ORAL_TABLET | Freq: Every day | ORAL | Status: DC

## 2015-08-25 ENCOUNTER — Ambulatory Visit: Payer: BLUE CROSS/BLUE SHIELD | Admitting: Family Medicine

## 2015-09-29 ENCOUNTER — Other Ambulatory Visit: Payer: Self-pay | Admitting: Family Medicine

## 2015-09-29 NOTE — Telephone Encounter (Signed)
Last filled 07/20/2015

## 2015-10-07 ENCOUNTER — Ambulatory Visit (INDEPENDENT_AMBULATORY_CARE_PROVIDER_SITE_OTHER): Payer: BLUE CROSS/BLUE SHIELD | Admitting: Family Medicine

## 2015-10-07 ENCOUNTER — Encounter: Payer: Self-pay | Admitting: Family Medicine

## 2015-10-07 VITALS — BP 117/87 | HR 69 | Temp 97.4°F | Ht 64.0 in | Wt 198.4 lb

## 2015-10-07 DIAGNOSIS — I10 Essential (primary) hypertension: Secondary | ICD-10-CM

## 2015-10-07 DIAGNOSIS — M25551 Pain in right hip: Secondary | ICD-10-CM | POA: Diagnosis not present

## 2015-10-07 DIAGNOSIS — F411 Generalized anxiety disorder: Secondary | ICD-10-CM | POA: Diagnosis not present

## 2015-10-07 MED ORDER — BENAZEPRIL-HYDROCHLOROTHIAZIDE 20-25 MG PO TABS: 1.0000 | ORAL_TABLET | Freq: Every day | ORAL | 3 refills | Status: DC

## 2015-10-07 MED ORDER — CLORAZEPATE DIPOTASSIUM 3.75 MG PO TABS: 3.7500 mg | ORAL_TABLET | Freq: Two times a day (BID) | ORAL | 2 refills | Status: DC

## 2015-10-07 NOTE — Progress Notes (Signed)
Subjective:  Patient ID: Leslie Robbins, female    DOB: 1955-11-02  Age: 60 y.o. MRN: [id number]  CC: GAD (med refill) and Hypertension (discuss meds)   HPI Leslie Robbins presents for anxiety under good control in spite of Mom's illness & daughter's drug abuse.Needs med refiled.Taking chlorazepate as needed. Sometimes twice daily, but also at times noe. More recently due to circumstances. Allows her to relax.  GAD 7 : Generalized Anxiety Score 10/07/2015 07/27/2015 01/23/2015  Nervous, Anxious, on Edge 1 2 1   Control/stop worrying 1 2 1   Worry too much - different things 1 2 1   Trouble relaxing 0 1 0  Restless 0 1 1  Easily annoyed or irritable 1 2 2   Afraid - awful might happen 1 1 0  Total GAD 7 Score 5 11 6   Anxiety Difficulty Not difficult at all Not difficult at all Not difficult at all    Depression screen [facility] 2/9 10/07/2015 07/27/2015 01/23/2015 11/05/2014 09/17/2014  Decreased Interest 1 0 0 0 2  Down, Depressed, Hopeless 0 0 1 1 1   PHQ - 2 Score 1 0 1 1 3   Altered sleeping - - - 0 1  Tired, decreased energy - - - 0 3  Change in appetite - - - 0 2  Feeling bad or failure about yourself  - - - 0 1  Trouble concentrating - - - 0 1  Moving slowly or fidgety/restless - - - 0 0  Suicidal thoughts - - - 0 0  PHQ-9 Score - - - 1 11      follow-up of hypertension. Patient has no history of headache chest pain or shortness of breath or recent cough. Patient also denies symptoms of TIA such as numbness weakness lateralizing. Patient checks  blood pressure at home and has not had any elevated readings recently. Patient denies side effects from his medication. States taking it regularly.    History Leslie Robbins has a past medical history of Anxiety; Arthritis; Depression; GERD (gastroesophageal reflux disease); Hypercholesteremia; Hypertension; and Hypothyroidism.   She has a past surgical history that includes Abdominal hysterectomy; Tubal ligation; Right Carpal Tunnel Release; Breast  lumpectomy; Colonoscopy (N/A, 05/30/2012); Dilation and curettage of uterus; Cardiac catheterization; and Total knee arthroplasty (Left, 09/16/2013).   Her family history includes Dementia in her mother; Depression in her sister; Fibromyalgia in her sister; Hypertension in her mother; Lupus in her mother.She reports that she has never smoked. She has never used smokeless tobacco. She reports that she does not drink alcohol or use drugs.    ROS Review of Systems  Constitutional: Negative for activity change, appetite change and fever.  HENT: Negative for congestion, rhinorrhea and sore throat.   Eyes: Negative for visual disturbance.  Respiratory: Negative for cough and shortness of breath.   Cardiovascular: Negative for chest pain and palpitations.  Gastrointestinal: Negative for abdominal pain, diarrhea and nausea.  Genitourinary: Negative for dysuria.  Musculoskeletal: Positive for arthralgias (right hip - not improved with current treatment/ steroid injection recently given). Negative for myalgias.  Psychiatric/Behavioral: Positive for sleep disturbance. The patient is nervous/anxious.     Objective:  BP 117/87 (BP Location: Left Arm, Patient Position: Sitting, Cuff Size: Large)   Pulse 69   Temp 97.4 F (36.3 C) (Oral)   Ht 5\' 4"  (1.626 m)   Wt 198 lb 6.4 oz (90 kg)   SpO2 98%   BMI 34.06 kg/m   BP Readings from Last 3 Encounters:  10/07/15 117/87  07/27/15  118/81  01/23/15 (!) 141/95    Wt Readings from Last 3 Encounters:  10/07/15 198 lb 6.4 oz (90 kg)  07/27/15 198 lb 6.4 oz (90 kg)  01/23/15 202 lb 6.4 oz (91.8 kg)     Physical Exam   Lab Results  Component Value Date   WBC 4.7 07/27/2015   HGB 12.5 04/18/2014   HCT 43.6 07/27/2015   PLT 237 07/27/2015   GLUCOSE 82 07/27/2015   CHOL 193 07/27/2015   TRIG 157 (H) 07/27/2015   HDL 54 07/27/2015   LDLCALC 108 (H) 07/27/2015   ALT 17 07/27/2015   AST 19 07/27/2015   NA 145 (H) 07/27/2015   K 4.2  07/27/2015   CL 103 07/27/2015   CREATININE 0.91 07/27/2015   BUN 18 07/27/2015   CO2 24 07/27/2015   TSH 0.231 (L) 07/27/2015   INR 0.93 09/05/2013    No results found.  Assessment & Plan:   Leslie Robbins was seen today for gad and hypertension.  Diagnoses and all orders for this visit:  Hip pain, right -     Ambulatory referral to Orthopedics  Other orders -     clorazepate (TRANXENE) 3.75 MG tablet; Take 1 tablet (3.75 mg total) by mouth 2 (two) times daily. -     benazepril-hydrochlorthiazide (LOTENSIN HCT) 20-25 MG tablet; Take 1 tablet by mouth daily.   I have changed Leslie Robbins's clorazepate. I am also having her maintain her aspirin, levothyroxine, omeprazole, amLODipine, RESTASIS MULTIDOSE, PAZEO, DULoxetine, meloxicam, and benazepril-hydrochlorthiazide.  Meds ordered this encounter  Medications  . clorazepate (TRANXENE) 3.75 MG tablet    Sig: Take 1 tablet (3.75 mg total) by mouth 2 (two) times daily.    Dispense:  60 tablet    Refill:  2    This request is for a new prescription for a controlled substance as required by Federal/State law.  . benazepril-hydrochlorthiazide (LOTENSIN HCT) 20-25 MG tablet    Sig: Take 1 tablet by mouth daily.    Dispense:  90 tablet    Refill:  3     Follow-up: Return in about 3 months (around 01/07/2016) for Wellness.  Claretta Fraise, M.D.

## 2015-10-19 ENCOUNTER — Other Ambulatory Visit: Payer: Self-pay | Admitting: Family Medicine

## 2015-11-05 DIAGNOSIS — M1611 Unilateral primary osteoarthritis, right hip: Secondary | ICD-10-CM | POA: Diagnosis not present

## 2015-11-23 ENCOUNTER — Telehealth: Payer: Self-pay

## 2015-11-23 DIAGNOSIS — M1611 Unilateral primary osteoarthritis, right hip: Secondary | ICD-10-CM | POA: Diagnosis not present

## 2015-11-25 NOTE — Telephone Encounter (Signed)
x

## 2015-11-26 ENCOUNTER — Ambulatory Visit (INDEPENDENT_AMBULATORY_CARE_PROVIDER_SITE_OTHER): Payer: BLUE CROSS/BLUE SHIELD

## 2015-11-26 ENCOUNTER — Telehealth: Payer: Self-pay | Admitting: Family Medicine

## 2015-11-26 ENCOUNTER — Other Ambulatory Visit: Payer: Self-pay | Admitting: *Deleted

## 2015-11-26 DIAGNOSIS — Z23 Encounter for immunization: Secondary | ICD-10-CM

## 2015-11-26 MED ORDER — VALACYCLOVIR HCL 1 G PO TABS: 1000.0000 mg | ORAL_TABLET | Freq: Every day | ORAL | 1 refills | Status: DC

## 2015-11-27 NOTE — Telephone Encounter (Signed)
No paperwork received yet. WS

## 2015-12-10 NOTE — Telephone Encounter (Signed)
Patient states that they have received everything that they need.

## 2015-12-15 DIAGNOSIS — M1611 Unilateral primary osteoarthritis, right hip: Secondary | ICD-10-CM | POA: Diagnosis not present

## 2015-12-18 DIAGNOSIS — Z96641 Presence of right artificial hip joint: Secondary | ICD-10-CM | POA: Diagnosis not present

## 2015-12-18 DIAGNOSIS — Z471 Aftercare following joint replacement surgery: Secondary | ICD-10-CM | POA: Diagnosis not present

## 2015-12-18 DIAGNOSIS — R2689 Other abnormalities of gait and mobility: Secondary | ICD-10-CM | POA: Diagnosis not present

## 2015-12-21 DIAGNOSIS — R2689 Other abnormalities of gait and mobility: Secondary | ICD-10-CM | POA: Diagnosis not present

## 2015-12-21 DIAGNOSIS — Z96641 Presence of right artificial hip joint: Secondary | ICD-10-CM | POA: Diagnosis not present

## 2015-12-21 DIAGNOSIS — Z471 Aftercare following joint replacement surgery: Secondary | ICD-10-CM | POA: Diagnosis not present

## 2015-12-23 DIAGNOSIS — Z471 Aftercare following joint replacement surgery: Secondary | ICD-10-CM | POA: Diagnosis not present

## 2015-12-23 DIAGNOSIS — Z96641 Presence of right artificial hip joint: Secondary | ICD-10-CM | POA: Diagnosis not present

## 2015-12-23 DIAGNOSIS — R2689 Other abnormalities of gait and mobility: Secondary | ICD-10-CM | POA: Diagnosis not present

## 2015-12-25 DIAGNOSIS — Z96641 Presence of right artificial hip joint: Secondary | ICD-10-CM | POA: Diagnosis not present

## 2015-12-25 DIAGNOSIS — R2689 Other abnormalities of gait and mobility: Secondary | ICD-10-CM | POA: Diagnosis not present

## 2015-12-25 DIAGNOSIS — Z471 Aftercare following joint replacement surgery: Secondary | ICD-10-CM | POA: Diagnosis not present

## 2015-12-28 DIAGNOSIS — Z96641 Presence of right artificial hip joint: Secondary | ICD-10-CM | POA: Diagnosis not present

## 2015-12-28 DIAGNOSIS — R2689 Other abnormalities of gait and mobility: Secondary | ICD-10-CM | POA: Diagnosis not present

## 2015-12-28 DIAGNOSIS — Z471 Aftercare following joint replacement surgery: Secondary | ICD-10-CM | POA: Diagnosis not present

## 2015-12-30 DIAGNOSIS — R2689 Other abnormalities of gait and mobility: Secondary | ICD-10-CM | POA: Diagnosis not present

## 2015-12-30 DIAGNOSIS — Z471 Aftercare following joint replacement surgery: Secondary | ICD-10-CM | POA: Diagnosis not present

## 2015-12-30 DIAGNOSIS — Z96641 Presence of right artificial hip joint: Secondary | ICD-10-CM | POA: Diagnosis not present

## 2016-01-04 DIAGNOSIS — R2689 Other abnormalities of gait and mobility: Secondary | ICD-10-CM | POA: Diagnosis not present

## 2016-01-04 DIAGNOSIS — Z96641 Presence of right artificial hip joint: Secondary | ICD-10-CM | POA: Diagnosis not present

## 2016-01-04 DIAGNOSIS — Z471 Aftercare following joint replacement surgery: Secondary | ICD-10-CM | POA: Diagnosis not present

## 2016-01-08 DIAGNOSIS — Z471 Aftercare following joint replacement surgery: Secondary | ICD-10-CM | POA: Diagnosis not present

## 2016-01-08 DIAGNOSIS — R2689 Other abnormalities of gait and mobility: Secondary | ICD-10-CM | POA: Diagnosis not present

## 2016-01-08 DIAGNOSIS — Z96641 Presence of right artificial hip joint: Secondary | ICD-10-CM | POA: Diagnosis not present

## 2016-01-11 DIAGNOSIS — Z96641 Presence of right artificial hip joint: Secondary | ICD-10-CM | POA: Diagnosis not present

## 2016-01-11 DIAGNOSIS — Z471 Aftercare following joint replacement surgery: Secondary | ICD-10-CM | POA: Diagnosis not present

## 2016-01-11 DIAGNOSIS — R2689 Other abnormalities of gait and mobility: Secondary | ICD-10-CM | POA: Diagnosis not present

## 2016-01-14 ENCOUNTER — Other Ambulatory Visit: Payer: Self-pay | Admitting: Family Medicine

## 2016-01-15 DIAGNOSIS — Z96641 Presence of right artificial hip joint: Secondary | ICD-10-CM | POA: Diagnosis not present

## 2016-01-15 DIAGNOSIS — R2689 Other abnormalities of gait and mobility: Secondary | ICD-10-CM | POA: Diagnosis not present

## 2016-01-15 DIAGNOSIS — Z471 Aftercare following joint replacement surgery: Secondary | ICD-10-CM | POA: Diagnosis not present

## 2016-01-17 ENCOUNTER — Other Ambulatory Visit: Payer: Self-pay | Admitting: Family Medicine

## 2016-01-19 DIAGNOSIS — Z96641 Presence of right artificial hip joint: Secondary | ICD-10-CM | POA: Diagnosis not present

## 2016-01-19 DIAGNOSIS — Z471 Aftercare following joint replacement surgery: Secondary | ICD-10-CM | POA: Diagnosis not present

## 2016-02-01 ENCOUNTER — Ambulatory Visit (INDEPENDENT_AMBULATORY_CARE_PROVIDER_SITE_OTHER): Payer: BLUE CROSS/BLUE SHIELD | Admitting: Family Medicine

## 2016-02-01 ENCOUNTER — Encounter: Payer: Self-pay | Admitting: Family Medicine

## 2016-02-01 VITALS — BP 125/84 | HR 76 | Temp 97.4°F | Ht 64.0 in | Wt 202.0 lb

## 2016-02-01 DIAGNOSIS — Z0000 Encounter for general adult medical examination without abnormal findings: Secondary | ICD-10-CM

## 2016-02-01 DIAGNOSIS — F411 Generalized anxiety disorder: Secondary | ICD-10-CM

## 2016-02-01 DIAGNOSIS — I10 Essential (primary) hypertension: Secondary | ICD-10-CM | POA: Diagnosis not present

## 2016-02-01 DIAGNOSIS — E039 Hypothyroidism, unspecified: Secondary | ICD-10-CM | POA: Diagnosis not present

## 2016-02-01 DIAGNOSIS — M1712 Unilateral primary osteoarthritis, left knee: Secondary | ICD-10-CM

## 2016-02-01 DIAGNOSIS — E785 Hyperlipidemia, unspecified: Secondary | ICD-10-CM

## 2016-02-01 MED ORDER — CLORAZEPATE DIPOTASSIUM 3.75 MG PO TABS: 3.7500 mg | ORAL_TABLET | Freq: Two times a day (BID) | ORAL | 5 refills | Status: DC

## 2016-02-01 NOTE — Progress Notes (Signed)
Subjective:  Patient ID: Leslie Robbins, female    DOB: 1955-11-02  Age: 60 y.o. MRN: [id number]  CC: Annual Exam (pt here today for CPE no pap, no concerns voiced at this time)   HPI DELFINA SCHREURS presents for CPE Patient presents with upper respiratory congestion. Rhinorrhea that is frequently purulent. There isminimal sore throat. Patient reports coughing frequently as well with yellow-colored/purulent sputum noted. There is no fever no chills no sweats. The patient denies being short of breath. Onset was 3-5 days ago.   History Saanya has a past medical history of Anxiety; Arthritis; Depression; GERD (gastroesophageal reflux disease); Hypercholesteremia; Hypertension; and Hypothyroidism.   She has a past surgical history that includes Abdominal hysterectomy; Tubal ligation; Right Carpal Tunnel Release; Breast lumpectomy; Colonoscopy (N/A, 05/30/2012); Dilation and curettage of uterus; Cardiac catheterization; and Total knee arthroplasty (Left, 09/16/2013).   Her family history includes Dementia in her mother; Depression in her sister; Fibromyalgia in her sister; Hypertension in her mother; Lupus in her mother.She reports that she has never smoked. She has never used smokeless tobacco. She reports that she does not drink alcohol or use drugs.    ROS Review of Systems  Constitutional: Negative for appetite change, chills, diaphoresis, fatigue, fever and unexpected weight change.  HENT: Negative for congestion, ear pain, hearing loss, postnasal drip, rhinorrhea, sneezing, sore throat and trouble swallowing.   Eyes: Negative for pain.  Respiratory: Negative for cough, chest tightness and shortness of breath.   Cardiovascular: Negative for chest pain and palpitations.  Gastrointestinal: Negative for abdominal pain, constipation, diarrhea, nausea and vomiting.  Endocrine: Negative for cold intolerance, heat intolerance, polydipsia, polyphagia and polyuria.  Genitourinary: Negative for dysuria,  frequency and menstrual problem.  Musculoskeletal: Negative for arthralgias and joint swelling.  Skin: Negative for rash.  Allergic/Immunologic: Negative for environmental allergies.  Neurological: Negative for dizziness, weakness, numbness and headaches.  Psychiatric/Behavioral: Negative for agitation and dysphoric mood.    Objective:  BP 125/84   Pulse 76   Temp 97.4 F (36.3 C) (Oral)   Ht _0  (1.626 m)   Wt 202 lb (91.6 kg)   BMI 34.67 kg/m   BP Readings from Last 3 Encounters:  02/01/16 125/84  10/07/15 117/87  07/27/15 118/81    Wt Readings from Last 3 Encounters:  02/01/16 202 lb (91.6 kg)  10/07/15 198 lb 6.4 oz (90 kg)  07/27/15 198 lb 6.4 oz (90 kg)     Physical Exam  Constitutional: She is oriented to person, place, and time. She appears well-developed and well-nourished. No distress.  HENT:  Head: Normocephalic and atraumatic.  Right Ear: External ear normal.  Left Ear: External ear normal.  Nose: Nose normal.  Mouth/Throat: Oropharynx is clear and moist.  Eyes: Conjunctivae and EOM are normal. Pupils are equal, round, and reactive to light.  Neck: Normal range of motion. Neck supple. No thyromegaly present.  Cardiovascular: Normal rate, regular rhythm and normal heart sounds.   No murmur heard. Pulmonary/Chest: Effort normal and breath sounds normal. No respiratory distress. She has no wheezes. She has no rales. Right breast exhibits no inverted nipple, no mass and no tenderness. Left breast exhibits no inverted nipple, no mass and no tenderness. Breasts are symmetrical.  Abdominal: Soft. Normal appearance and bowel sounds are normal. She exhibits no distension, no abdominal bruit and no mass. There is no splenomegaly or hepatomegaly. There is no tenderness. There is no tenderness at McBurney's point and negative Murphy's sign.  Musculoskeletal: Normal range of  motion. She exhibits no edema or tenderness.  Lymphadenopathy:    She has no cervical  adenopathy.  Neurological: She is alert and oriented to person, place, and time. She has normal reflexes.  Skin: Skin is warm and dry. No rash noted.  Psychiatric: She has a normal mood and affect. Her behavior is normal. Judgment and thought content normal.     Lab Results  Component Value Date   WBC 4.7 07/27/2015   HGB 12.5 04/18/2014   HCT 43.6 07/27/2015   PLT 237 07/27/2015   GLUCOSE 82 07/27/2015   CHOL 193 07/27/2015   TRIG 157 (H) 07/27/2015   HDL 54 07/27/2015   LDLCALC 108 (H) 07/27/2015   ALT 17 07/27/2015   AST 19 07/27/2015   NA 145 (H) 07/27/2015   K 4.2 07/27/2015   CL 103 07/27/2015   CREATININE 0.91 07/27/2015   BUN 18 07/27/2015   CO2 24 07/27/2015   TSH 0.231 (L) 07/27/2015   INR 0.93 09/05/2013    No results found.  Assessment & Plan:   Mataya was seen today for annual exam.  Diagnoses and all orders for this visit:  Essential hypertension -     CBC with Differential/Platelet -     CMP14+EGFR  GAD (generalized anxiety disorder) -     CBC with Differential/Platelet -     CMP14+EGFR  Hyperlipidemia with target LDL less than 100 -     CBC with Differential/Platelet -     CMP14+EGFR -     Lipid panel  Hypothyroidism, unspecified type -     CBC with Differential/Platelet -     CMP14+EGFR -     TSH + free T4  Primary osteoarthritis of left knee -     CBC with Differential/Platelet -     CMP14+EGFR  Well adult exam -     CBC with Differential/Platelet -     CMP14+EGFR  Other orders -     clorazepate (TRANXENE) 3.75 MG tablet; Take 1 tablet (3.75 mg total) by mouth 2 (two) times daily.    Meds renewed X 6 mos  I am having Ms. Garciagarcia maintain her aspirin, levothyroxine, RESTASIS MULTIDOSE, PAZEO, meloxicam, benazepril-hydrochlorthiazide, amLODipine, valACYclovir, omeprazole, DULoxetine, and clorazepate.  Meds ordered this encounter  Medications  . clorazepate (TRANXENE) 3.75 MG tablet    Sig: Take 1 tablet (3.75 mg total) by mouth 2  (two) times daily.    Dispense:  60 tablet    Refill:  5    This request is for a new prescription for a controlled substance as required by Federal/State law.     Follow-up: Return in about 6 months (around 07/31/2016).  Claretta Fraise, M.D.

## 2016-02-03 LAB — CBC WITH DIFFERENTIAL/PLATELET
BASOS: 1 %
Basophils Absolute: 0.1 10*3/uL (ref 0.0–0.2)
EOS (ABSOLUTE): 0.4 10*3/uL (ref 0.0–0.4)
EOS: 6 %
HEMATOCRIT: 38.2 % (ref 34.0–46.6)
HEMOGLOBIN: 12.6 g/dL (ref 11.1–15.9)
IMMATURE GRANULOCYTES: 0 %
Immature Grans (Abs): 0 10*3/uL (ref 0.0–0.1)
Lymphocytes Absolute: 2.1 10*3/uL (ref 0.7–3.1)
Lymphs: 35 %
MCH: 28.1 pg (ref 26.6–33.0)
MCHC: 33 g/dL (ref 31.5–35.7)
MCV: 85 fL (ref 79–97)
MONOS ABS: 0.3 10*3/uL (ref 0.1–0.9)
Monocytes: 5 %
NEUTROS PCT: 53 %
Neutrophils Absolute: 3.2 10*3/uL (ref 1.4–7.0)
Platelets: 254 10*3/uL (ref 150–379)
RBC: 4.48 x10E6/uL (ref 3.77–5.28)
RDW: 14.9 % (ref 12.3–15.4)
WBC: 6 10*3/uL (ref 3.4–10.8)

## 2016-02-03 LAB — CMP14+EGFR
A/G RATIO: 1.5 (ref 1.2–2.2)
ALBUMIN: 4.4 g/dL (ref 3.6–4.8)
ALT: 13 IU/L (ref 0–32)
AST: 21 IU/L (ref 0–40)
Alkaline Phosphatase: 132 IU/L — ABNORMAL HIGH (ref 39–117)
BUN / CREAT RATIO: 25 (ref 12–28)
BUN: 26 mg/dL (ref 8–27)
Bilirubin Total: 0.3 mg/dL (ref 0.0–1.2)
CALCIUM: 10.1 mg/dL (ref 8.7–10.3)
CO2: 26 mmol/L (ref 18–29)
Chloride: 100 mmol/L (ref 96–106)
Creatinine, Ser: 1.02 mg/dL — ABNORMAL HIGH (ref 0.57–1.00)
GFR, EST AFRICAN AMERICAN: 68 mL/min/{1.73_m2} (ref >59)
GFR, EST NON AFRICAN AMERICAN: 59 mL/min/{1.73_m2} — AB (ref >59)
GLOBULIN, TOTAL: 3 g/dL (ref 1.5–4.5)
Glucose: 84 mg/dL (ref 65–99)
POTASSIUM: 4 mmol/L (ref 3.5–5.2)
SODIUM: 142 mmol/L (ref 134–144)
TOTAL PROTEIN: 7.4 g/dL (ref 6.0–8.5)

## 2016-02-03 LAB — LIPID PANEL
CHOL/HDL RATIO: 4 ratio (ref 0.0–4.4)
Cholesterol, Total: 242 mg/dL — ABNORMAL HIGH (ref 100–199)
HDL: 60 mg/dL (ref >39)
LDL CALC: 153 mg/dL — AB (ref 0–99)
Triglycerides: 146 mg/dL (ref 0–149)
VLDL CHOLESTEROL CAL: 29 mg/dL (ref 5–40)

## 2016-02-03 LAB — TSH+FREE T4
Free T4: 1.46 ng/dL (ref 0.82–1.77)
TSH: 0.525 u[IU]/mL (ref 0.450–4.500)

## 2016-02-03 MED ORDER — ROSUVASTATIN CALCIUM 20 MG PO TABS: 20.0000 mg | ORAL_TABLET | Freq: Every day | ORAL | 1 refills | Status: DC

## 2016-02-03 NOTE — Addendum Note (Signed)
Addended by: Shelbie Ammons on: 02/03/2016 09:04 AM   Modules accepted: Orders

## 2016-02-10 ENCOUNTER — Telehealth: Payer: Self-pay | Admitting: Family Medicine

## 2016-02-10 MED ORDER — AMOXICILLIN-POT CLAVULANATE 875-125 MG PO TABS: 1.0000 | ORAL_TABLET | Freq: Two times a day (BID) | ORAL | 0 refills | Status: DC

## 2016-02-10 NOTE — Telephone Encounter (Signed)
Pt notified RX Augmentin was sent into the pharmacy

## 2016-02-10 NOTE — Telephone Encounter (Signed)
Please review and advise.

## 2016-02-10 NOTE — Telephone Encounter (Signed)
I sent in the requested prescription 

## 2016-03-07 ENCOUNTER — Other Ambulatory Visit: Payer: Self-pay | Admitting: Family Medicine

## 2016-03-08 ENCOUNTER — Other Ambulatory Visit: Payer: Self-pay | Admitting: Family Medicine

## 2016-03-14 ENCOUNTER — Ambulatory Visit (INDEPENDENT_AMBULATORY_CARE_PROVIDER_SITE_OTHER): Payer: BLUE CROSS/BLUE SHIELD | Admitting: Nurse Practitioner

## 2016-03-14 ENCOUNTER — Encounter: Payer: Self-pay | Admitting: Nurse Practitioner

## 2016-03-14 VITALS — BP 133/89 | HR 82 | Temp 97.2°F | Ht 64.0 in | Wt 207.0 lb

## 2016-03-14 DIAGNOSIS — J0101 Acute recurrent maxillary sinusitis: Secondary | ICD-10-CM | POA: Diagnosis not present

## 2016-03-14 MED ORDER — LEVOFLOXACIN 500 MG PO TABS: 500.0000 mg | ORAL_TABLET | Freq: Every day | ORAL | 0 refills | Status: AC

## 2016-03-14 NOTE — Patient Instructions (Signed)

## 2016-03-14 NOTE — Progress Notes (Signed)
Subjective:     Leslie Robbins is a 61 y.o. female who presents for evaluation of sinus pain. Symptoms include: congestion, cough, facial pain, headaches, purulent rhinorrhea and sore throat. Onset of symptoms was 1 month ago. Symptoms have been unchanged since that time. Past history is significant for occasional episodes of bronchitis. Patient is a non-smoker. * She took amoxicillin at the first of December which did not help. The following portions of the patient's history were reviewed and updated as appropriate: allergies, current medications, past family history, past medical history, past social history, past surgical history and problem list.  Review of Systems Pertinent items noted in HPI and remainder of comprehensive ROS otherwise negative.   Objective:    BP 133/89   Pulse 82   Temp 97.2 F (36.2 C) (Oral)   Ht 5\' 4"  (1.626 m)   Wt 207 lb (93.9 kg)   BMI 35.53 kg/m  General appearance: alert and cooperative Eyes: conjunctivae/corneas clear. PERRL, EOM's intact. Fundi benign. Ears: normal TM's and external ear canals both ears Nose: clear discharge, moderate congestion, turbinates red, sinus tenderness bilateral Throat: lips, mucosa, and tongue normal; teeth and gums normal Neck: no adenopathy, no carotid bruit, no JVD, supple, symmetrical, trachea midline and thyroid not enlarged, symmetric, no tenderness/mass/nodules Lungs: clear to auscultation bilaterally and deep dry cough Heart: regular rate and rhythm, S1, S2 normal, no murmur, click, rub or gallop    Assessment:    Acute bacterial sinusitis.    Plan:   1. Take meds as prescribed 2. Use a cool mist humidifier especially during the winter months and when heat has been humid. 3. Use saline nose sprays frequently 4. Saline irrigations of the nose can be very helpful if done frequently.  * 4X daily for 1 week*  * Use of a nettie pot can be helpful with this. Follow directions with this* 5. Drink plenty of  fluids 6. Keep thermostat turn down low 7.For any cough or congestion  Use plain Mucinex- regular strength or max strength is fine   * Children- consult with Pharmacist for dosing 8. For fever or aces or pains- take tylenol or ibuprofen appropriate for age and weight.  * for fevers greater than 101 orally you may alternate ibuprofen and tylenol every  3 hours.   Meds ordered this encounter  Medications  . levofloxacin (LEVAQUIN) 500 MG tablet    Sig: Take 1 tablet (500 mg total) by mouth daily.    Dispense:  10 tablet    Refill:  0    Order Specific Question:   Supervising Provider    Answer:   Eustaquio Maize [[id number]]   Mary-Margaret Hassell Done, FNP

## 2016-03-22 ENCOUNTER — Other Ambulatory Visit: Payer: Self-pay | Admitting: Family Medicine

## 2016-03-30 ENCOUNTER — Other Ambulatory Visit: Payer: Self-pay | Admitting: *Deleted

## 2016-03-30 ENCOUNTER — Other Ambulatory Visit (INDEPENDENT_AMBULATORY_CARE_PROVIDER_SITE_OTHER): Payer: BLUE CROSS/BLUE SHIELD

## 2016-03-30 ENCOUNTER — Ambulatory Visit (INDEPENDENT_AMBULATORY_CARE_PROVIDER_SITE_OTHER): Payer: BLUE CROSS/BLUE SHIELD

## 2016-03-30 DIAGNOSIS — R05 Cough: Secondary | ICD-10-CM | POA: Diagnosis not present

## 2016-03-30 DIAGNOSIS — R059 Cough, unspecified: Secondary | ICD-10-CM

## 2016-03-30 MED ORDER — PREDNISONE 10 MG PO TABS: ORAL_TABLET | ORAL | 0 refills | Status: DC

## 2016-04-07 DIAGNOSIS — H40033 Anatomical narrow angle, bilateral: Secondary | ICD-10-CM | POA: Diagnosis not present

## 2016-04-07 DIAGNOSIS — H04123 Dry eye syndrome of bilateral lacrimal glands: Secondary | ICD-10-CM | POA: Diagnosis not present

## 2016-04-09 ENCOUNTER — Other Ambulatory Visit: Payer: Self-pay | Admitting: Family Medicine

## 2016-04-10 ENCOUNTER — Other Ambulatory Visit: Payer: Self-pay | Admitting: Nurse Practitioner

## 2016-04-15 ENCOUNTER — Encounter: Payer: Self-pay | Admitting: Family Medicine

## 2016-04-15 ENCOUNTER — Ambulatory Visit (INDEPENDENT_AMBULATORY_CARE_PROVIDER_SITE_OTHER): Payer: BLUE CROSS/BLUE SHIELD | Admitting: Family Medicine

## 2016-04-15 ENCOUNTER — Ambulatory Visit (INDEPENDENT_AMBULATORY_CARE_PROVIDER_SITE_OTHER): Payer: BLUE CROSS/BLUE SHIELD

## 2016-04-15 VITALS — BP 134/85 | HR 73 | Temp 97.5°F | Ht 64.0 in | Wt 206.0 lb

## 2016-04-15 DIAGNOSIS — M25511 Pain in right shoulder: Secondary | ICD-10-CM | POA: Diagnosis not present

## 2016-04-15 DIAGNOSIS — M7521 Bicipital tendinitis, right shoulder: Secondary | ICD-10-CM | POA: Diagnosis not present

## 2016-04-15 MED ORDER — BETAMETHASONE SOD PHOS & ACET 6 (3-3) MG/ML IJ SUSP
6.0000 mg | Freq: Once | INTRAMUSCULAR | Status: AC
  Administered 2016-04-15: 6 mg via INTRAMUSCULAR

## 2016-04-15 NOTE — Progress Notes (Signed)
Subjective:  Patient ID: Leslie Robbins, female    DOB: Jan 04, 1956  Age: 61 y.o. MRN: [id number]  CC: Arm Pain (pt here today c/o right shoulder/arm pain, very hard and painful to raise her right arm)   HPI FEIGE KRUTZ presents for Just went back to work on January 15. The right shoulder began hurting almost immediately. She does have a lot of repetitive motion at work. Pain has been building ever since. It hurts to lift the arm both for flexion and abduction. Occasional pain is actually in the flexor surface of the upper arm from deltoid to elbow It is exacerbated by external rotation. Pain is moderately severe and keeps her from doing any activity with the right upper extremity involving lifting   History Chauntay has a past medical history of Anxiety; Arthritis; Depression; GERD (gastroesophageal reflux disease); Hypercholesteremia; Hypertension; and Hypothyroidism.   She has a past surgical history that includes Abdominal hysterectomy; Tubal ligation; Right Carpal Tunnel Release; Breast lumpectomy; Colonoscopy (N/A, 05/30/2012); Dilation and curettage of uterus; Cardiac catheterization; and Total knee arthroplasty (Left, 09/16/2013).   Her family history includes Dementia in her mother; Depression in her sister; Fibromyalgia in her sister; Hypertension in her mother; Lupus in her mother.She reports that she has never smoked. She has never used smokeless tobacco. She reports that she does not drink alcohol or use drugs.    ROS Review of Systems  Constitutional: Negative for fever.  HENT: Negative for congestion, rhinorrhea and sore throat.   Respiratory: Negative for cough and shortness of breath.   Cardiovascular: Negative for chest pain and palpitations.  Gastrointestinal: Negative for abdominal pain.  Musculoskeletal: Positive for arthralgias and myalgias.    Objective:  BP 134/85   Pulse 73   Temp 97.5 F (36.4 C) (Oral)   Ht 5\' 4"  (1.626 m)   Wt 206 lb (93.4 kg)   BMI 35.36  kg/m   BP Readings from Last 3 Encounters:  04/15/16 134/85  03/14/16 133/89  02/01/16 125/84    Wt Readings from Last 3 Encounters:  04/15/16 206 lb (93.4 kg)  03/14/16 207 lb (93.9 kg)  02/01/16 202 lb (91.6 kg)     Physical Exam  Constitutional: She appears well-developed and well-nourished.  HENT:  Head: Normocephalic.  Cardiovascular: Normal rate and regular rhythm.   No murmur heard. Pulmonary/Chest: Effort normal and breath sounds normal.  Musculoskeletal: She exhibits tenderness (at biceps tendon proximally. Tender for external rotation of shoulder.) and deformity. She exhibits no edema.    No results found.  Assessment & Plan:   Winona was seen today for arm pain.  Diagnoses and all orders for this visit:  Pain of right shoulder region -     DG Shoulder Right; Future  Bicipital tendonitis of right shoulder -     betamethasone acetate-betamethasone sodium phosphate (CELESTONE) injection 6 mg; Inject 1 mL (6 mg total) into the muscle once. -     Ambulatory referral to Physical Therapy      I have discontinued Ms. Camino's predniSONE. I am also having her maintain her aspirin, RESTASIS MULTIDOSE, PAZEO, meloxicam, benazepril-hydrochlorthiazide, valACYclovir, clorazepate, rosuvastatin, omeprazole, levothyroxine, DULoxetine, and amLODipine. We will continue to administer betamethasone acetate-betamethasone sodium phosphate.  Allergies as of 04/15/2016      Reactions   Sulfonamide Derivatives Rash      Medication List       Accurate as of 04/15/16 10:24 AM. Always use your most recent med list.  amLODipine 2.5 MG tablet Commonly known as:  NORVASC TAKE 1 TABLET EVERY DAY   aspirin 81 MG tablet Take 81 mg by mouth daily. Reported on 07/27/2015   benazepril-hydrochlorthiazide 20-25 MG tablet Commonly known as:  LOTENSIN HCT Take 1 tablet by mouth daily.   clorazepate 3.75 MG tablet Commonly known as:  TRANXENE Take 1 tablet (3.75 mg total) by  mouth 2 (two) times daily.   DULoxetine 30 MG capsule Commonly known as:  CYMBALTA TAKE 3 CAPSULES (90 MG TOTAL) BY MOUTH DAILY.   levothyroxine 112 MCG tablet Commonly known as:  SYNTHROID, LEVOTHROID TAKE 1 TABLET (112 MCG TOTAL) BY MOUTH DAILY.   meloxicam 15 MG tablet Commonly known as:  MOBIC Take 1 tablet (15 mg total) by mouth daily.   omeprazole 40 MG capsule Commonly known as:  PRILOSEC TAKE 1 CAPSULE (40 MG TOTAL) BY MOUTH DAILY.   PAZEO 0.7 % Soln Generic drug:  Olopatadine HCl INTO ONE DROP INTO EACH EYE EVERY MORNING   RESTASIS MULTIDOSE 0.05 % ophthalmic emulsion Generic drug:  cycloSPORINE INSTILL 1 DROP INTO EACH EYE TWICE DAILY   rosuvastatin 20 MG tablet Commonly known as:  CRESTOR Take 1 tablet (20 mg total) by mouth daily.   valACYclovir 1000 MG tablet Commonly known as:  VALTREX Take 1 tablet (1,000 mg total) by mouth daily.        Follow-up: Return in about 2 weeks (around 04/29/2016).  Claretta Fraise, M.D.

## 2016-04-20 ENCOUNTER — Ambulatory Visit: Payer: Self-pay | Admitting: Physical Therapy

## 2016-04-21 ENCOUNTER — Ambulatory Visit: Payer: BLUE CROSS/BLUE SHIELD | Attending: Family Medicine | Admitting: Physical Therapy

## 2016-04-21 DIAGNOSIS — M25511 Pain in right shoulder: Secondary | ICD-10-CM | POA: Diagnosis not present

## 2016-04-21 DIAGNOSIS — M25611 Stiffness of right shoulder, not elsewhere classified: Secondary | ICD-10-CM

## 2016-04-21 NOTE — Therapy (Signed)
[facility] [place], [place], [address] Phone: [phone]   Fax:  [phone]  Physical Therapy Evaluation  Patient Details  Name: Leslie Robbins MRN: [id number] Date of Birth: [birth year] Referring Provider: Claretta Fraise MD.  Encounter Date: 04/21/2016      PT End of Session - 04/21/16 1747    Visit Number 1   Number of Visits 12   Date for PT Re-Evaluation 06/02/16   PT Start Time 1202   PT Stop Time 1246   PT Time Calculation (min) 44 min   Activity Tolerance Patient tolerated treatment well   Behavior During Therapy [facility] for tasks assessed/performed      Past Medical History:  Diagnosis Date  . Anxiety   . Arthritis   . Depression   . GERD (gastroesophageal reflux disease)   . Hypercholesteremia   . Hypertension   . Hypothyroidism     Past Surgical History:  Procedure Laterality Date  . ABDOMINAL HYSTERECTOMY    . BREAST LUMPECTOMY     benign  . CARDIAC CATHETERIZATION     9 YRS AGO  . COLONOSCOPY N/A 05/30/2012   Procedure: COLONOSCOPY;  Surgeon: Rogene Houston, MD;  [facility];  Service: Endoscopy;  Laterality: N/A;  830-moved to 1125 Ann to notify pt  . DILATION AND CURETTAGE OF UTERUS     X2  . Right Carpal Tunnel Release    . 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   Procedure: LEFT TOTAL KNEE ARTHROPLASTY;  Surgeon: Gearlean Alf, MD;  Location: [facility];  Service: Orthopedics;  Laterality: Left;  . TUBAL LIGATION      There were no vitals filed for this visit.       Subjective Assessment - 04/21/16 1756    Subjective The patient presents to OPPT with c/o right shoulder pain that began after returning to work on 03/22/16 which requires repetitive arm movements.  Her pain rises to as high as a 123456 with certain right UE movements.  Her pain is rated at an 8/10 today.  She has found "nothing really decreases her pain.   Patient Stated Goals Use right UE without pain.   Currently in  Pain? Yes   Pain Score 8    Pain Location Shoulder   Pain Orientation Left   Pain Descriptors / Indicators Aching   Pain Type Acute pain   Pain Onset 1 to 4 weeks ago   Pain Frequency Constant   Aggravating Factors  See above.   Pain Relieving Factors See above.            [facility] PT Assessment - 04/21/16 0001      Assessment   Medical Diagnosis Bicipital tendonitis of right shoulder.   Referring Provider Claretta Fraise MD.   Onset Date/Surgical Date 03/22/16     Precautions   Precautions None     Restrictions   Weight Bearing Restrictions No     Balance Screen   Has the patient fallen in the past 6 months No   Has the patient had a decrease in activity level because of a fear of falling?  No   Is the patient reluctant to leave their home because of a fear of falling?  No     Home Environment   Living Environment Private residence     Prior Function   Level of Independence Independent     Posture/Postural Control   Posture/Postural Control Postural limitations   Postural Limitations Rounded Shoulders;Forward  head     ROM / Strength   AROM / PROM / Strength AROM;Strength     AROM   Overall AROM Comments In supine patient's right shoulder active-assistive right shoulder flexion= 150 degrees; IR essentially full and ER= 45 degrees.     Strength   Overall Strength Comments Rigth shoulder ER= 4/5; IR= 4+/5.     Palpation   Palpation comment C/o pain over right UT and right acromial ridge with referred pain on occasions even below elbow.     Special Tests    Special Tests Rotator Cuff Impingement  Normal UE DTR's.   Rotator Cuff Impingment tests Neer impingement test;Hawkins- Kennedy test;Speed's test     Neer Impingement test    Findings Positive   Side Right     Hawkins-Kennedy test   Findings Positive   Side Right     Speed's test   Findings Positive   Side Right     Ambulation/Gait   Gait Comments WNL.                   [facility] Adult PT  Treatment/Exercise - 04/21/16 0001      Modalities   Modalities Electrical Stimulation;Moist Heat     Moist Heat Therapy   Number Minutes Moist Heat 15 Minutes   Moist Heat Location --  Left shoulder.     Acupuncturist Location Left shoulder.   Electrical Stimulation Action IFC   Electrical Stimulation Parameters 80-150 Hz x 15 minutes.   Electrical Stimulation Goals Pain                PT Education - 04/21/16 1804    Education provided Yes   Education Details HEP.   Person(s) Educated Patient   Methods Explanation;Demonstration;Tactile cues;Verbal cues   Comprehension Verbalized understanding;Returned demonstration;Verbal cues required;Need further instruction             PT Long Term Goals - 04/21/16 1819      PT LONG TERM GOAL #1   Title Independent with a HEP.   Time 6   Period Weeks   Status New     PT LONG TERM GOAL #2   Title Active right shoulder ER to 80 degrees+ to allow for easily donning/doffing of apparel.   Time 6   Period Weeks   Status New     PT LONG TERM GOAL #3   Title Increase right shoulder strength to a solid 5/5 to increase stability for performance of functional activities   Time 6   Period Weeks   Status New     PT LONG TERM GOAL #4   Title Perform ADL's with right shoulder pain not > 2-3/10.   Time 6   Period Weeks   Status New               Plan - 04/21/16 1811    Clinical Impression Statement The patient has severe right shoulder pain with certain active movements.  She has a positive Impingement test.  UE DTR's are normal.  Pain referred from shoulder to below elbow on occasions.  She is especially limited into right shoulder ER.  She will benefit from skilled physical therapy to increase right shoulder ROM and strength.   Rehab Potential Excellent   PT Frequency 2x / week   PT Duration 6 weeks   PT Treatment/Interventions ADLs/Self Care Home Management;Cryotherapy;Electrical  Stimulation;Moist Heat;Ultrasound;Therapeutic exercise;Therapeutic activities;Manual techniques;Passive range of motion;Dry needling;Vasopneumatic Device   PT Next  Visit Plan Please perform Combo e'stim-U/S to patient's right shoulder; PROM to improve ER and flexion PRN; begin with isometric than progress to PRE's (ie:  RW4), full can an dsdly er exercise.   Consulted and Agree with Plan of Care Patient      Patient will benefit from skilled therapeutic intervention in order to improve the following deficits and impairments:  Decreased range of motion, Decreased activity tolerance, Decreased strength, Pain  Visit Diagnosis: Acute pain of right shoulder - Plan: PT plan of care cert/re-cert  Stiffness of right shoulder, not elsewhere classified - Plan: PT plan of care cert/re-cert     Problem List Patient Active Problem List   Diagnosis Date Noted  . Bursitis, hip 07/27/2015  . Lichen sclerosus of female genitalia 12/30/2013  . OA (osteoarthritis) of knee 09/16/2013  . GERD (gastroesophageal reflux disease) 07/11/2013  . GAD (generalized anxiety disorder) 07/11/2013  . Depression with anxiety 07/11/2013  . Hyperlipidemia with target LDL less than 100 07/11/2013  . Hypothyroidism 07/11/2013  . Essential hypertension 12/19/2008    Verenice Westrich, [place] MPT 04/21/2016, 6:23 PM  [facility] [address] [place], [place], [address] Phone: [phone]   Fax:  [phone]  Name: RIVIERA QUIRAM MRN: [id number] Date of Birth: [birth year]

## 2016-04-21 NOTE — Patient Instructions (Signed)
Instruct in wall climbs and seated table stretch to increase right shoulder ER.

## 2016-04-26 ENCOUNTER — Encounter: Payer: Self-pay | Admitting: Physical Therapy

## 2016-04-26 ENCOUNTER — Ambulatory Visit: Payer: BLUE CROSS/BLUE SHIELD | Admitting: Physical Therapy

## 2016-04-26 DIAGNOSIS — M25511 Pain in right shoulder: Secondary | ICD-10-CM | POA: Diagnosis not present

## 2016-04-26 DIAGNOSIS — M25611 Stiffness of right shoulder, not elsewhere classified: Secondary | ICD-10-CM

## 2016-04-26 NOTE — Therapy (Signed)
[facility] [place], [place], [address] Phone: [phone]   Fax:  [phone]  Physical Therapy Treatment  Patient Details  Name: Leslie Robbins MRN: [id number] Date of Birth: 1955-04-15 Referring Provider: Claretta Fraise MD.  Encounter Date: 04/26/2016      PT End of Session - 04/26/16 1514    Visit Number 2   Number of Visits 12   Date for PT Re-Evaluation 06/02/16   PT Start Time [id number]   PT Stop Time 1609   PT Time Calculation (min) 53 min   Activity Tolerance Patient tolerated treatment well   Behavior During Therapy [facility] for tasks assessed/performed      Past Medical History:  Diagnosis Date  . Anxiety   . Arthritis   . Depression   . GERD (gastroesophageal reflux disease)   . Hypercholesteremia   . Hypertension   . Hypothyroidism     Past Surgical History:  Procedure Laterality Date  . ABDOMINAL HYSTERECTOMY    . BREAST LUMPECTOMY     benign  . CARDIAC CATHETERIZATION     9 YRS AGO  . COLONOSCOPY N/A 05/30/2012   Procedure: COLONOSCOPY;  Surgeon: Rogene Houston, MD;  [facility];  Service: Endoscopy;  Laterality: N/A;  830-moved to 1125 Ann to notify pt  . DILATION AND CURETTAGE OF UTERUS     X2  . Right Carpal Tunnel Release    . 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   Procedure: LEFT TOTAL KNEE ARTHROPLASTY;  Surgeon: Gearlean Alf, MD;  Location: [facility];  Service: Orthopedics;  Laterality: Left;  . TUBAL LIGATION      There were no vitals filed for this visit.      Subjective Assessment - 04/26/16 1513    Subjective Reports that her shoulder is a little better than previous treatment as she has been using TENS unit and completing HEP. Reports still feeling popping along R humerus.   Patient Stated Goals Use right UE without pain.   Currently in Pain? Yes   Pain Score 6    Pain Location Shoulder   Pain Orientation Left   Pain Descriptors / Indicators Discomfort   Pain Type  Acute pain   Pain Onset 1 to 4 weeks ago   Pain Frequency Intermittent            [facility] PT Assessment - 04/26/16 0001      Assessment   Medical Diagnosis Bicipital tendonitis of right shoulder.   Onset Date/Surgical Date 03/22/16     Precautions   Precautions None     Restrictions   Weight Bearing Restrictions No                     [facility] Adult PT Treatment/Exercise - 04/26/16 0001      Exercises   Exercises Shoulder     Shoulder Exercises: Pulleys   Flexion Other (comment)  x6 min   Other Pulley Exercises UE ranger in sitting flex/circles x20 reps   increased pain with returning to neutral from flexion     Modalities   Modalities Electrical Stimulation;Moist Heat;Ultrasound     Moist Heat Therapy   Number Minutes Moist Heat 15 Minutes   Moist Heat Location Shoulder     Electrical Stimulation   Electrical Stimulation Location L shoulder/ UT   Electrical Stimulation Action IFC   Electrical Stimulation Parameters 80-150 hz x15 min   Electrical Stimulation Goals Pain;Tone     Ultrasound  Ultrasound Location R proximal bicep   Ultrasound Parameters 1.5 w/cm2, 100%, 1 mhz x10 min   Ultrasound Goals Pain     Manual Therapy   Manual Therapy Soft tissue mobilization   Soft tissue mobilization STW/TPR to L Bicep/Deltoid/ UT region to reduce soreness, pain and tone                     PT Long Term Goals - 04/21/16 1819      PT LONG TERM GOAL #1   Title Independent with a HEP.   Time 6   Period Weeks   Status New     PT LONG TERM GOAL #2   Title Active right shoulder ER to 80 degrees+ to allow for easily donning/doffing of apparel.   Time 6   Period Weeks   Status New     PT LONG TERM GOAL #3   Title Increase right shoulder strength to a solid 5/5 to increase stability for performance of functional activities   Time 6   Period Weeks   Status New     PT LONG TERM GOAL #4   Title Perform ADL's with right shoulder pain not >  2-3/10.   Time 6   Period Weeks   Status New               Plan - 04/26/16 1601    Clinical Impression Statement Patient presented in clinic today with continued R anterior shoulder pain that intermittantly wakes her at night. Patient experienced greater pain when returning from flexion to neutral and into horizontal adduction with UE range per patient report. Patient experienced one painful pop during pulleys in which she reported she stopped pulley but was able to start again. [place] completed to R proximal Bicep region with normal response. Increased tone noted in R Bicep, deltoid, UT region upon palpation. Normal modalities response noted following removal of the modalities. Educated patient to continue HEP and pain management as instructed in evaluation. Patient experienced "some" pain in R Bicep region following today's treatment per patient report.   Rehab Potential Excellent   PT Frequency 2x / week   PT Duration 6 weeks   PT Treatment/Interventions ADLs/Self Care Home Management;Cryotherapy;Electrical Stimulation;Moist Heat;Ultrasound;Therapeutic exercise;Therapeutic activities;Manual techniques;Passive range of motion;Dry needling;Vasopneumatic Device   PT Next Visit Plan Please perform Combo e'stim-U/S to patient's right shoulder; PROM to improve ER and flexion PRN; begin with isometric than progress to PRE's (ie:  RW4), full can an dsdly er exercise.   Consulted and Agree with Plan of Care Patient      Patient will benefit from skilled therapeutic intervention in order to improve the following deficits and impairments:  Decreased range of motion, Decreased activity tolerance, Decreased strength, Pain  Visit Diagnosis: Acute pain of right shoulder  Stiffness of right shoulder, not elsewhere classified     Problem List Patient Active Problem List   Diagnosis Date Noted  . Bursitis, hip 07/27/2015  . Lichen sclerosus of female genitalia 12/30/2013  . OA (osteoarthritis)  of knee 09/16/2013  . GERD (gastroesophageal reflux disease) 07/11/2013  . GAD (generalized anxiety disorder) 07/11/2013  . Depression with anxiety 07/11/2013  . Hyperlipidemia with target LDL less than 100 07/11/2013  . Hypothyroidism 07/11/2013  . Essential hypertension 12/19/2008    Wynelle Fanny, PTA 04/26/2016, 4:19 PM  [facility] [address] [place], [place], [address] Phone: [phone]   Fax:  [phone]  Name: Leslie Robbins MRN: [id number] Date  of Birth: Jul 22, 1955

## 2016-04-28 ENCOUNTER — Ambulatory Visit: Payer: BLUE CROSS/BLUE SHIELD | Admitting: Physical Therapy

## 2016-04-28 DIAGNOSIS — M25611 Stiffness of right shoulder, not elsewhere classified: Secondary | ICD-10-CM

## 2016-04-28 DIAGNOSIS — M25511 Pain in right shoulder: Secondary | ICD-10-CM | POA: Diagnosis not present

## 2016-04-28 NOTE — Therapy (Signed)
[facility] [place], [place], [address] Phone: [phone]   Fax:  [phone]  Physical Therapy Treatment  Patient Details  Name: Leslie Robbins MRN: [id number] Date of Birth: 04-21-1955 Referring Provider: Claretta Fraise MD.  Encounter Date: 04/28/2016      PT End of Session - 04/28/16 [id number]    Visit Number 3   Number of Visits 12   Date for PT Re-Evaluation 06/02/16   PT Start Time 0315   PT Stop Time 0417   PT Time Calculation (min) 62 min   Activity Tolerance Patient tolerated treatment well   Behavior During Therapy [facility] for tasks assessed/performed      Past Medical History:  Diagnosis Date  . Anxiety   . Arthritis   . Depression   . GERD (gastroesophageal reflux disease)   . Hypercholesteremia   . Hypertension   . Hypothyroidism     Past Surgical History:  Procedure Laterality Date  . ABDOMINAL HYSTERECTOMY    . BREAST LUMPECTOMY     benign  . CARDIAC CATHETERIZATION     9 YRS AGO  . COLONOSCOPY N/A 05/30/2012   Procedure: COLONOSCOPY;  Surgeon: Rogene Houston, MD;  [facility];  Service: Endoscopy;  Laterality: N/A;  830-moved to 1125 Ann to notify pt  . DILATION AND CURETTAGE OF UTERUS     X2  . Right Carpal Tunnel Release    . 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   Procedure: LEFT TOTAL KNEE ARTHROPLASTY;  Surgeon: Gearlean Alf, MD;  [facility]: [facility];  Service: Orthopedics;  Laterality: Left;  . TUBAL LIGATION      There were no vitals filed for this visit.      Subjective Assessment - 04/28/16 1613    Subjective I felt better after last treatment.   Pain Score 5    Pain Location Shoulder   Pain Orientation Left   Pain Descriptors / Indicators Discomfort   Pain Type Acute pain   Pain Onset 1 to 4 weeks ago    Treatment:  Pulleys x 5 minutes, wall ladder x 5 minutes and UE Ranger x 3 minutes f/b STW/M to left shoulder ant/post shoulder acromial ridge region and UT region x  25 minutes f/b HMP and IFC x 15 minutes.  Excellent job today.                                  PT Long Term Goals - 04/21/16 1819      PT LONG TERM GOAL #1   Title Independent with a HEP.   Time 6   Period Weeks   Status New     PT LONG TERM GOAL #2   Title Active right shoulder ER to 80 degrees+ to allow for easily donning/doffing of apparel.   Time 6   Period Weeks   Status New     PT LONG TERM GOAL #3   Title Increase right shoulder strength to a solid 5/5 to increase stability for performance of functional activities   Time 6   Period Weeks   Status New     PT LONG TERM GOAL #4   Title Perform ADL's with right shoulder pain not > 2-3/10.   Time 6   Period Weeks   Status New               Plan - 04/28/16 1513  PT Next Visit Plan Please re-assess with Berg balance test.      Patient will benefit from skilled therapeutic intervention in order to improve the following deficits and impairments:     Visit Diagnosis: Acute pain of right shoulder  Stiffness of right shoulder, not elsewhere classified     Problem List Patient Active Problem List   Diagnosis Date Noted  . Bursitis, hip 07/27/2015  . Lichen sclerosus of female genitalia 12/30/2013  . OA (osteoarthritis) of knee 09/16/2013  . GERD (gastroesophageal reflux disease) 07/11/2013  . GAD (generalized anxiety disorder) 07/11/2013  . Depression with anxiety 07/11/2013  . Hyperlipidemia with target LDL less than 100 07/11/2013  . Hypothyroidism 07/11/2013  . Essential hypertension 12/19/2008    Pier Bosher, [place] MPT 04/28/2016, 4:27 PM  [facility] [address] [place], [place], [address] Phone: [phone]   Fax:  [phone]  Name: Leslie Robbins MRN: [id number] Date of Birth: [birth year]

## 2016-04-29 ENCOUNTER — Ambulatory Visit (INDEPENDENT_AMBULATORY_CARE_PROVIDER_SITE_OTHER): Payer: BLUE CROSS/BLUE SHIELD | Admitting: Family Medicine

## 2016-04-29 ENCOUNTER — Encounter: Payer: Self-pay | Admitting: Family Medicine

## 2016-04-29 VITALS — BP 133/86 | HR 73 | Temp 97.3°F | Ht 64.0 in | Wt 206.0 lb

## 2016-04-29 DIAGNOSIS — R5383 Other fatigue: Secondary | ICD-10-CM | POA: Diagnosis not present

## 2016-04-29 DIAGNOSIS — M25511 Pain in right shoulder: Secondary | ICD-10-CM | POA: Diagnosis not present

## 2016-04-29 MED ORDER — CYANOCOBALAMIN 1000 MCG/ML IJ SOLN
1000.0000 ug | INTRAMUSCULAR | Status: DC
  Administered 2016-04-29: 1000 ug via INTRAMUSCULAR

## 2016-04-29 MED ORDER — BETAMETHASONE SOD PHOS & ACET 6 (3-3) MG/ML IJ SUSP
6.0000 mg | Freq: Once | INTRAMUSCULAR | Status: AC
  Administered 2016-04-29: 6 mg via INTRAMUSCULAR

## 2016-04-29 NOTE — Progress Notes (Signed)
Subjective:  Patient ID: Leslie Robbins, female    DOB: 1955-07-23  Age: 61 y.o. MRN: [id number]  CC: Shoulder Pain (pt here today following up on her right shoulder, she has been doing PT and states it is feeling better.)   HPI Leslie Robbins presents for better, but painful, limited abduction.  Loss of energy. Taking oral B12. Difficulty staying active to complete daily routine. Sx getting worse over the last several weeks.    History Leslie Robbins has a past medical history of Anxiety; Arthritis; Depression; GERD (gastroesophageal reflux disease); Hypercholesteremia; Hypertension; and Hypothyroidism.   She has a past surgical history that includes Abdominal hysterectomy; Tubal ligation; Right Carpal Tunnel Release; Breast lumpectomy; Colonoscopy (N/A, 05/30/2012); Dilation and curettage of uterus; Cardiac catheterization; and Total knee arthroplasty (Left, 09/16/2013).   Her family history includes Dementia in her mother; Depression in her sister; Fibromyalgia in her sister; Hypertension in her mother; Lupus in her mother.She reports that she has never smoked. She has never used smokeless tobacco. She reports that she does not drink alcohol or use drugs.    ROS Review of Systems  Constitutional: Positive for activity change and fatigue. Negative for appetite change, diaphoresis and fever.  HENT: Negative for congestion and rhinorrhea.   Eyes: Negative for visual disturbance.  Respiratory: Negative for cough and shortness of breath.   Cardiovascular: Negative for chest pain and palpitations.  Gastrointestinal: Negative for abdominal pain, diarrhea and nausea.  Genitourinary: Negative for dysuria.  Musculoskeletal: Positive for arthralgias and myalgias.  Psychiatric/Behavioral: Negative for dysphoric mood and sleep disturbance.    Objective:  BP 133/86   Pulse 73   Temp 97.3 F (36.3 C) (Oral)   Ht 5\' 4"  (1.626 m)   Wt 206 lb (93.4 kg)   BMI 35.36 kg/m   BP Readings from Last 3  Encounters:  04/29/16 133/86  04/15/16 134/85  03/14/16 133/89    Wt Readings from Last 3 Encounters:  04/29/16 206 lb (93.4 kg)  04/15/16 206 lb (93.4 kg)  03/14/16 207 lb (93.9 kg)     Physical Exam  Constitutional: She is oriented to person, place, and time. She appears well-developed and well-nourished. No distress.  HENT:  Head: Normocephalic and atraumatic.  Eyes: Conjunctivae are normal. Pupils are equal, round, and reactive to light.  Neck: Normal range of motion. Neck supple. No thyromegaly present.  Cardiovascular: Normal rate, regular rhythm and normal heart sounds.   No murmur heard. Pulmonary/Chest: Effort normal and breath sounds normal. No respiratory distress. She has no wheezes. She has no rales.  Abdominal: Soft. Bowel sounds are normal. She exhibits no distension. There is no tenderness.  Musculoskeletal: Normal range of motion. She exhibits tenderness (with passive & active R shoulder abduction at 60 degrees.).  Lymphadenopathy:    She has no cervical adenopathy.  Neurological: She is alert and oriented to person, place, and time.  Skin: Skin is warm and dry.  Psychiatric: She has a normal mood and affect. Her behavior is normal. Judgment and thought content normal.    No results found.  Assessment & Plan:   Leslie Robbins was seen today for shoulder pain.  Diagnoses and all orders for this visit:  Pain of right shoulder region -     betamethasone acetate-betamethasone sodium phosphate (CELESTONE) injection 6 mg; Inject 1 mL (6 mg total) into the muscle once.  Lack of energy -     cyanocobalamin ((VITAMIN B-12)) injection 1,000 mcg; Inject 1 mL (1,000 mcg total) into the  muscle every 30 (thirty) days.    I am having Leslie Robbins maintain her aspirin, RESTASIS MULTIDOSE, PAZEO, meloxicam, benazepril-hydrochlorthiazide, valACYclovir, clorazepate, rosuvastatin, omeprazole, levothyroxine, DULoxetine, and amLODipine. We administered cyanocobalamin and betamethasone  acetate-betamethasone sodium phosphate. We will continue to administer cyanocobalamin.  Allergies as of 04/29/2016      Reactions   Sulfonamide Derivatives Rash      Medication List       Accurate as of 04/29/16 11:59 PM. Always use your most recent med list.          amLODipine 2.5 MG tablet Commonly known as:  NORVASC TAKE 1 TABLET EVERY DAY   aspirin 81 MG tablet Take 81 mg by mouth daily. Reported on 07/27/2015   benazepril-hydrochlorthiazide 20-25 MG tablet Commonly known as:  LOTENSIN HCT Take 1 tablet by mouth daily.   clorazepate 3.75 MG tablet Commonly known as:  TRANXENE Take 1 tablet (3.75 mg total) by mouth 2 (two) times daily.   DULoxetine 30 MG capsule Commonly known as:  CYMBALTA TAKE 3 CAPSULES (90 MG TOTAL) BY MOUTH DAILY.   levothyroxine 112 MCG tablet Commonly known as:  SYNTHROID, LEVOTHROID TAKE 1 TABLET (112 MCG TOTAL) BY MOUTH DAILY.   meloxicam 15 MG tablet Commonly known as:  MOBIC Take 1 tablet (15 mg total) by mouth daily.   omeprazole 40 MG capsule Commonly known as:  PRILOSEC TAKE 1 CAPSULE (40 MG TOTAL) BY MOUTH DAILY.   PAZEO 0.7 % Soln Generic drug:  Olopatadine HCl INTO ONE DROP INTO EACH EYE EVERY MORNING   RESTASIS MULTIDOSE 0.05 % ophthalmic emulsion Generic drug:  cycloSPORINE INSTILL 1 DROP INTO EACH EYE TWICE DAILY   rosuvastatin 20 MG tablet Commonly known as:  CRESTOR Take 1 tablet (20 mg total) by mouth daily.   valACYclovir 1000 MG tablet Commonly known as:  VALTREX Take 1 tablet (1,000 mg total) by mouth daily.        Follow-up: Return in about 3 weeks (around 05/20/2016).  Claretta Fraise, M.D.

## 2016-05-02 ENCOUNTER — Encounter: Payer: Self-pay | Admitting: Physical Therapy

## 2016-05-02 ENCOUNTER — Ambulatory Visit: Payer: BLUE CROSS/BLUE SHIELD | Admitting: Physical Therapy

## 2016-05-02 DIAGNOSIS — M25611 Stiffness of right shoulder, not elsewhere classified: Secondary | ICD-10-CM

## 2016-05-02 DIAGNOSIS — M25511 Pain in right shoulder: Secondary | ICD-10-CM | POA: Diagnosis not present

## 2016-05-02 NOTE — Therapy (Signed)
[facility] [place], [place], [address] Phone: [phone]   Fax:  [phone]  Physical Therapy Treatment  Patient Details  Name: Leslie Robbins MRN: [id number] Date of Birth: [birth year] Referring Provider: Claretta Fraise MD.  Encounter Date: 05/02/2016      PT End of Session - 05/02/16 1528    Visit Number 4   Number of Visits 12   Date for PT Re-Evaluation 06/02/16   PT Start Time [id number]   PT Stop Time 1605   PT Time Calculation (min) 48 min   Activity Tolerance Patient tolerated treatment well   Behavior During Therapy [facility] for tasks assessed/performed      Past Medical History:  Diagnosis Date  . Anxiety   . Arthritis   . Depression   . GERD (gastroesophageal reflux disease)   . Hypercholesteremia   . Hypertension   . Hypothyroidism     Past Surgical History:  Procedure Laterality Date  . ABDOMINAL HYSTERECTOMY    . BREAST LUMPECTOMY     benign  . CARDIAC CATHETERIZATION     9 YRS AGO  . COLONOSCOPY N/A 05/30/2012   Procedure: COLONOSCOPY;  Surgeon: Rogene Houston, MD;  [facility];  Service: Endoscopy;  Laterality: N/A;  830-moved to 1125 Ann to notify pt  . DILATION AND CURETTAGE OF UTERUS     X2  . Right Carpal Tunnel Release    . 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   Procedure: LEFT TOTAL KNEE ARTHROPLASTY;  Surgeon: Gearlean Alf, MD;  Location: [facility];  Service: Orthopedics;  Laterality: Left;  . TUBAL LIGATION      There were no vitals filed for this visit.      Subjective Assessment - 05/02/16 1515    Subjective Reports that her shoulder pain increases to 7-8/10 with abduction/extension motions.   Patient Stated Goals Use right UE without pain.   Currently in Pain? Yes   Pain Score 5    Pain Location Shoulder   Pain Orientation Left   Pain Descriptors / Indicators Discomfort   Pain Type Acute pain   Pain Onset 1 to 4 weeks ago   Pain Frequency Intermittent             [facility] PT Assessment - 05/02/16 0001      Assessment   Medical Diagnosis Bicipital tendonitis of right shoulder.   Onset Date/Surgical Date 03/22/16     Precautions   Precautions None     Restrictions   Weight Bearing Restrictions No                     [facility] Adult PT Treatment/Exercise - 05/02/16 0001      Shoulder Exercises: Pulleys   Flexion Other (comment)  x5 min   Other Pulley Exercises UE ranger in sitting flex/circles x20 reps      Shoulder Exercises: ROM/Strengthening   UBE (Upper Arm Bike) 120 RPM x5 min     Modalities   Modalities Electrical Stimulation;Moist Heat;Ultrasound     Moist Heat Therapy   Number Minutes Moist Heat 15 Minutes   Moist Heat Location Shoulder     Electrical Stimulation   Electrical Stimulation Location L shoulder   Electrical Stimulation Action IFC   Electrical Stimulation Parameters 80-150 hz x15 min   Electrical Stimulation Goals Pain;Tone     Ultrasound   Ultrasound Location R proximal Bicep   Ultrasound Parameters 1.5 w/cm2, 100%, 1 mhz x10 min  Ultrasound Goals Pain     Manual Therapy   Manual Therapy Myofascial release   Myofascial Release IASTW to R mid to proximal Bicep region to reduce pain and adhesions                     PT Long Term Goals - 04/21/16 1819      PT LONG TERM GOAL #1   Title Independent with a HEP.   Time 6   Period Weeks   Status New     PT LONG TERM GOAL #2   Title Active right shoulder ER to 80 degrees+ to allow for easily donning/doffing of apparel.   Time 6   Period Weeks   Status New     PT LONG TERM GOAL #3   Title Increase right shoulder strength to a solid 5/5 to increase stability for performance of functional activities   Time 6   Period Weeks   Status New     PT LONG TERM GOAL #4   Title Perform ADL's with right shoulder pain not > 2-3/10.   Time 6   Period Weeks   Status New               Plan - 05/02/16 1557    Clinical Impression  Statement Patient tolerated today's treatment fairly well as she continues to have R shoulder pain and feeling as if her shoulder needs to pop. Patient especially had pain with seated R shoulder UE ranger with circles. Normal response noted with [place] to R proximal Bicep region today. Gentle IASTW to R mid to proximal Bicep was tolerated well by patient with patient recieving education regarding purpose of IASTW. No abnormal R deltoid tightness was palpated today. Normal modalities response noted following removal of the modalities.   Rehab Potential Excellent   PT Frequency 2x / week   PT Duration 6 weeks   PT Treatment/Interventions ADLs/Self Care Home Management;Cryotherapy;Electrical Stimulation;Moist Heat;Ultrasound;Therapeutic exercise;Therapeutic activities;Manual techniques;Passive range of motion;Dry needling;Vasopneumatic Device   PT Next Visit Plan Continue with symptom assessment per MPT POC.   Consulted and Agree with Plan of Care Patient      Patient will benefit from skilled therapeutic intervention in order to improve the following deficits and impairments:  Decreased range of motion, Decreased activity tolerance, Decreased strength, Pain  Visit Diagnosis: Acute pain of right shoulder  Stiffness of right shoulder, not elsewhere classified     Problem List Patient Active Problem List   Diagnosis Date Noted  . Bursitis, hip 07/27/2015  . Lichen sclerosus of female genitalia 12/30/2013  . OA (osteoarthritis) of knee 09/16/2013  . GERD (gastroesophageal reflux disease) 07/11/2013  . GAD (generalized anxiety disorder) 07/11/2013  . Depression with anxiety 07/11/2013  . Hyperlipidemia with target LDL less than 100 07/11/2013  . Hypothyroidism 07/11/2013  . Essential hypertension 12/19/2008    Wynelle Fanny, PTA 05/02/2016, 4:09 PM  [facility] [address] [place], [place], [address] Phone: [phone]   Fax:   [phone]  Name: Leslie Robbins MRN: [id number] Date of Birth: 03/17/1955

## 2016-05-04 ENCOUNTER — Encounter: Payer: Self-pay | Admitting: Physical Therapy

## 2016-05-04 ENCOUNTER — Ambulatory Visit: Payer: BLUE CROSS/BLUE SHIELD | Admitting: Physical Therapy

## 2016-05-04 DIAGNOSIS — M25511 Pain in right shoulder: Secondary | ICD-10-CM

## 2016-05-04 DIAGNOSIS — M25611 Stiffness of right shoulder, not elsewhere classified: Secondary | ICD-10-CM | POA: Diagnosis not present

## 2016-05-04 NOTE — Therapy (Signed)
[facility] [place], [place], [address] Phone: [phone]   Fax:  [phone]  Physical Therapy Treatment  Patient Details  Name: Leslie Robbins MRN: [id number] Date of Birth: 1955/03/17 Referring Provider: Claretta Fraise MD.  Encounter Date: 05/04/2016      PT End of Session - 05/04/16 1514    Visit Number 5   Number of Visits 12   Date for PT Re-Evaluation 06/02/16   PT Start Time [id number]   PT Stop Time 1528   PT Time Calculation (min) 55 min   Activity Tolerance Patient tolerated treatment well   Behavior During Therapy [facility] for tasks assessed/performed      Past Medical History:  Diagnosis Date  . Anxiety   . Arthritis   . Depression   . GERD (gastroesophageal reflux disease)   . Hypercholesteremia   . Hypertension   . Hypothyroidism     Past Surgical History:  Procedure Laterality Date  . ABDOMINAL HYSTERECTOMY    . BREAST LUMPECTOMY     benign  . CARDIAC CATHETERIZATION     9 YRS AGO  . COLONOSCOPY N/A 05/30/2012   Procedure: COLONOSCOPY;  Surgeon: Rogene Houston, MD;  [facility];  Service: Endoscopy;  Laterality: N/A;  830-moved to 1125 Ann to notify pt  . DILATION AND CURETTAGE OF UTERUS     X2  . Right Carpal Tunnel Release    . 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   Procedure: LEFT TOTAL KNEE ARTHROPLASTY;  Surgeon: Gearlean Alf, MD;  Location: [facility];  Service: Orthopedics;  Laterality: Left;  . TUBAL LIGATION      There were no vitals filed for this visit.      Subjective Assessment - 05/04/16 1437    Subjective Patient arrived and reported "feeling better in some ways and not in other ways" ongoing pain in shoulder   Patient Stated Goals Use right UE without pain.   Currently in Pain? Yes   Pain Score 4    Pain Location Shoulder   Pain Orientation Left   Pain Descriptors / Indicators Discomfort   Pain Type Acute pain   Pain Onset 1 to 4 weeks ago   Pain Frequency  Intermittent   Aggravating Factors  movement or increased activity   Pain Relieving Factors at rest            [employer] PT Assessment - 05/04/16 0001      ROM / Strength   AROM / PROM / Strength AROM;PROM     AROM   AROM Assessment Site Shoulder   Right/Left Shoulder Right   Right Shoulder Flexion 133 Degrees   Right Shoulder External Rotation 60 Degrees     PROM   PROM Assessment Site Shoulder   Right/Left Shoulder Right   Right Shoulder Flexion 150 Degrees   Right Shoulder External Rotation 73 Degrees                     [facility] Adult PT Treatment/Exercise - 05/04/16 0001      Shoulder Exercises: Standing   Other Standing Exercises 4 way isometrics x5 each way     Shoulder Exercises: Pulleys   Other Pulley Exercises UE ranger for elevation 2x10     Shoulder Exercises: ROM/Strengthening   UBE (Upper Arm Bike) 120 RPM x6 min  painful after     Moist Heat Therapy   Number Minutes Moist Heat 15 Minutes   Moist Heat Location Shoulder  Acupuncturist Location L shoulder   Electrical Stimulation Action IFC   Electrical Stimulation Parameters 80-150hz  x34min   Electrical Stimulation Goals Pain;Tone     Ultrasound   Ultrasound Location right proximal bicep   Ultrasound Parameters 1.5w/cm2/50%/17mhz x21min   Ultrasound Goals Pain     Manual Therapy   Manual Therapy Passive ROM   Passive ROM PROM for right shoulder flexion and ER with gentle stretch, rhythmic stabs for IR/ER in scaption, flex/ext at 90 degrees                     PT Long Term Goals - 05/04/16 1516      PT LONG TERM GOAL #1   Title Independent with a HEP.   Time 6   Period Weeks   Status On-going     PT LONG TERM GOAL #2   Title Active right shoulder ER to 80 degrees+ to allow for easily donning/doffing of apparel.   Time 6   Period Weeks   Status --  AROM 60 degrees 05/04/16     PT LONG TERM GOAL #3   Title Increase right  shoulder strength to a solid 5/5 to increase stability for performance of functional activities   Time 6   Period Weeks   Status On-going     PT LONG TERM GOAL #4   Title Perform ADL's with right shoulder pain not > 2-3/10.   Time 6   Period Weeks   Status On-going               Plan - 05/04/16 1517    Clinical Impression Statement Patient tolerated treatment fairly well today. Patient reported some increased pain after UBE. Patient has limited ROM today and was able to increase range after gentle manual PROM/stretching. Patient tolerated isometrics and rhythimic stabilization with no dificulty. Patient progressing toward goals yet unable to meet any today due to pain, strength and full ROm deficts.    Rehab Potential Excellent   PT Frequency 2x / week   PT Duration 6 weeks   PT Treatment/Interventions ADLs/Self Care Home Management;Cryotherapy;Electrical Stimulation;Moist Heat;Ultrasound;Therapeutic exercise;Therapeutic activities;Manual techniques;Passive range of motion;Dry needling;Vasopneumatic Device   PT Next Visit Plan cont with POC per patient tolerance for ROM in right shoulder, strengthing with RW4 next treatment and modalities PRN. NO UBE   Consulted and Agree with Plan of Care Patient      Patient will benefit from skilled therapeutic intervention in order to improve the following deficits and impairments:  Decreased range of motion, Decreased activity tolerance, Decreased strength, Pain  Visit Diagnosis: Acute pain of right shoulder  Stiffness of right shoulder, not elsewhere classified     Problem List Patient Active Problem List   Diagnosis Date Noted  . Bursitis, hip 07/27/2015  . Lichen sclerosus of female genitalia 12/30/2013  . OA (osteoarthritis) of knee 09/16/2013  . GERD (gastroesophageal reflux disease) 07/11/2013  . GAD (generalized anxiety disorder) 07/11/2013  . Depression with anxiety 07/11/2013  . Hyperlipidemia with target LDL less than  100 07/11/2013  . Hypothyroidism 07/11/2013  . Essential hypertension 12/19/2008    Phillips Climes, PTA 05/04/2016, 3:29 PM  [facility] [place], [place], [address] Phone: [phone]   Fax:  [phone]  Name: Leslie Robbins MRN: [id number] Date of Birth: 20-Jan-1956

## 2016-05-06 DIAGNOSIS — M25511 Pain in right shoulder: Secondary | ICD-10-CM | POA: Diagnosis not present

## 2016-05-07 ENCOUNTER — Other Ambulatory Visit: Payer: Self-pay | Admitting: Family Medicine

## 2016-05-09 ENCOUNTER — Ambulatory Visit: Payer: BLUE CROSS/BLUE SHIELD | Attending: Family Medicine | Admitting: Physical Therapy

## 2016-05-09 DIAGNOSIS — M25611 Stiffness of right shoulder, not elsewhere classified: Secondary | ICD-10-CM | POA: Insufficient documentation

## 2016-05-09 DIAGNOSIS — M25511 Pain in right shoulder: Secondary | ICD-10-CM

## 2016-05-09 NOTE — Therapy (Signed)
[facility] [place], [place], [address] Phone: [phone]   Fax:  [phone]  Physical Therapy Treatment  Patient Details  Name: Leslie Robbins MRN: [id number] Date of Birth: 18-Feb-1956 Referring Provider: Claretta Fraise MD.  Encounter Date: 05/09/2016      PT End of Session - 05/09/16 0918    Visit Number 6   Number of Visits 12   Date for PT Re-Evaluation 06/02/16   PT Start Time 0901   PT Stop Time 1003   PT Time Calculation (min) 62 min   Activity Tolerance Patient tolerated treatment well   Behavior During Therapy [facility] for tasks assessed/performed      Past Medical History:  Diagnosis Date  . Anxiety   . Arthritis   . Depression   . GERD (gastroesophageal reflux disease)   . Hypercholesteremia   . Hypertension   . Hypothyroidism     Past Surgical History:  Procedure Laterality Date  . ABDOMINAL HYSTERECTOMY    . BREAST LUMPECTOMY     benign  . CARDIAC CATHETERIZATION     9 YRS AGO  . COLONOSCOPY N/A 05/30/2012   Procedure: COLONOSCOPY;  Surgeon: Rogene Houston, MD;  [facility];  Service: Endoscopy;  Laterality: N/A;  830-moved to 1125 Ann to notify pt  . DILATION AND CURETTAGE OF UTERUS     X2  . Right Carpal Tunnel Release    . 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   Procedure: LEFT TOTAL KNEE ARTHROPLASTY;  Surgeon: Gearlean Alf, MD;  Location: [facility];  Service: Orthopedics;  Laterality: Left;  . TUBAL LIGATION      There were no vitals filed for this visit.      Subjective Assessment - 05/09/16 0916    Subjective Reports that she laid out yesterday in the sun and that has really helped and wonders if going to the tanning bed for 5 minutes may help. Reports that she can really tell a differnce in shoulder if she hasn't worked.   Patient Stated Goals Use right UE without pain.   Currently in Pain? Yes   Pain Score 4    Pain Location Shoulder   Pain Orientation Left;Anterior   Pain Descriptors / Indicators Discomfort   Pain Type Acute pain   Pain Onset 1 to 4 weeks ago   Pain Frequency Intermittent   Aggravating Factors  Activity/ movement/ work   Pain Relieving Factors Rest            [facility] PT Assessment - 05/09/16 0001      Assessment   Medical Diagnosis Bicipital tendonitis of right shoulder.   Onset Date/Surgical Date 03/22/16   Next MD Visit 05/20/2016     Precautions   Precautions None     Restrictions   Weight Bearing Restrictions No                     [facility] Adult PT Treatment/Exercise - 05/09/16 0001      Shoulder Exercises: Supine   Flexion AROM;Both   Flexion Limitations 3x10 reps     Shoulder Exercises: Pulleys   Flexion Other (comment)  x6 min   Other Pulley Exercises UE ranger for elevation 2x10   Other Pulley Exercises RUE wall ladder x10 reps     Shoulder Exercises: ROM/Strengthening   UBE (Upper Arm Bike) 120 RPM x5 min     Shoulder Exercises: Isometric Strengthening   Flexion Other (comment)  5 sec hold x  10 reps   Extension Other (comment)  5 sec hold x10 reps   External Rotation Other (comment)  5 sec hold x10 reps   Internal Rotation Other (comment)  5 sec hold x10 reps     Modalities   Modalities Electrical Stimulation;Moist Heat;Ultrasound     Moist Heat Therapy   Number Minutes Moist Heat 15 Minutes   Moist Heat Location Shoulder     Electrical Stimulation   Electrical Stimulation Location L shoulder   Electrical Stimulation Action IFC   Electrical Stimulation Parameters 80-150 hz x15 min   Electrical Stimulation Goals Pain;Tone     Ultrasound   Ultrasound Location R proximal Bicep   Ultrasound Parameters 1.5 w/cm2, 100%, 1 mhz x10 min   Ultrasound Goals Pain                     PT Long Term Goals - 05/04/16 1516      PT LONG TERM GOAL #1   Title Independent with a HEP.   Time 6   Period Weeks   Status On-going     PT LONG TERM GOAL #2   Title Active right  shoulder ER to 80 degrees+ to allow for easily donning/doffing of apparel.   Time 6   Period Weeks   Status --  AROM 60 degrees 05/04/16     PT LONG TERM GOAL #3   Title Increase right shoulder strength to a solid 5/5 to increase stability for performance of functional activities   Time 6   Period Weeks   Status On-going     PT LONG TERM GOAL #4   Title Perform ADL's with right shoulder pain not > 2-3/10.   Time 6   Period Weeks   Status On-going               Plan - 05/09/16 1020    Clinical Impression Statement Patient tolerated today's treatment much better today as she still had pain but not as intense for her. Patient had no complaints with any exercises today and had no facial grimacing noted. Patient required minimal multimodal cueing for proper isometric technique. AROM shoulder flexion completed to assist in eccentric and concentric strengthening of Bicep. Normal modalities response noted following removal of the modalities.   Rehab Potential Excellent   PT Frequency 2x / week   PT Duration 6 weeks   PT Treatment/Interventions ADLs/Self Care Home Management;Cryotherapy;Electrical Stimulation;Moist Heat;Ultrasound;Therapeutic exercise;Therapeutic activities;Manual techniques;Passive range of motion;Dry needling;Vasopneumatic Device   PT Next Visit Plan Continue with R shoulder ROM and strengthening with modalities PRN per MPT POC. Assess R shoulder pain and symptoms prior to treatment.   Consulted and Agree with Plan of Care Patient      Patient will benefit from skilled therapeutic intervention in order to improve the following deficits and impairments:  Decreased range of motion, Decreased activity tolerance, Decreased strength, Pain  Visit Diagnosis: Acute pain of right shoulder  Stiffness of right shoulder, not elsewhere classified     Problem List Patient Active Problem List   Diagnosis Date Noted  . Bursitis, hip 07/27/2015  . Lichen sclerosus of  female genitalia 12/30/2013  . OA (osteoarthritis) of knee 09/16/2013  . GERD (gastroesophageal reflux disease) 07/11/2013  . GAD (generalized anxiety disorder) 07/11/2013  . Depression with anxiety 07/11/2013  . Hyperlipidemia with target LDL less than 100 07/11/2013  . Hypothyroidism 07/11/2013  . Essential hypertension 12/19/2008    Wynelle Fanny, PTA 05/09/2016, 10:24 AM  [facility] [place], [place], [address] Phone: [phone]   Fax:  [phone]  Name: Leslie Robbins MRN: [id number] Date of Birth: [birth year]

## 2016-05-12 ENCOUNTER — Encounter: Payer: BLUE CROSS/BLUE SHIELD | Admitting: Physical Therapy

## 2016-05-13 ENCOUNTER — Ambulatory Visit (INDEPENDENT_AMBULATORY_CARE_PROVIDER_SITE_OTHER): Payer: BLUE CROSS/BLUE SHIELD | Admitting: Pediatrics

## 2016-05-13 ENCOUNTER — Encounter: Payer: Self-pay | Admitting: Pediatrics

## 2016-05-13 VITALS — BP 118/79 | HR 93 | Temp 97.5°F | Ht 64.0 in | Wt 201.4 lb

## 2016-05-13 DIAGNOSIS — R39198 Other difficulties with micturition: Secondary | ICD-10-CM | POA: Diagnosis not present

## 2016-05-13 DIAGNOSIS — N3090 Cystitis, unspecified without hematuria: Secondary | ICD-10-CM

## 2016-05-13 DIAGNOSIS — R300 Dysuria: Secondary | ICD-10-CM

## 2016-05-13 LAB — MICROSCOPIC EXAMINATION: Renal Epithel, UA: NONE SEEN /hpf

## 2016-05-13 LAB — URINALYSIS, COMPLETE
BILIRUBIN UA: NEGATIVE
GLUCOSE, UA: NEGATIVE
Leukocytes, UA: NEGATIVE
NITRITE UA: NEGATIVE
Specific Gravity, UA: 1.025 (ref 1.005–1.030)
UUROB: 1 mg/dL (ref 0.2–1.0)
pH, UA: 5 (ref 5.0–7.5)

## 2016-05-13 MED ORDER — NITROFURANTOIN MONOHYD MACRO 100 MG PO CAPS: 100.0000 mg | ORAL_CAPSULE | Freq: Two times a day (BID) | ORAL | 0 refills | Status: AC

## 2016-05-13 NOTE — Progress Notes (Signed)
  Subjective:   Patient ID: Leslie Robbins, female    DOB: 27-Oct-1955, 61 y.o.   MRN: [id number] CC: Emesis; Headahce; Abdominal Pain  HPI: Leslie Robbins is a 61 y.o. female presenting for Emesis; Headahce; Abdominal Pain; and Urinary Retention  Sick for 3 days Sometimes having dysuria, not every time Not drinking much recently because feeling sick to her stomach Nausea constant for last few days Started throwing up two days ago, twice total that day No vomiting today Feeling weak Night sweats a couple nights a week for the past month No fevers that she knows of  No coughing, runny nose She takes omeprazole every day for acid reflux If she skips it for 2-3 days has symptoms  No abdominal pain Normal stooling  Relevant past medical, surgical, family and social history reviewed. Allergies and medications reviewed and updated. History  Smoking Status  . Never Smoker  Smokeless Tobacco  . Never Used   ROS: Per HPI   Objective:    BP 118/79   Pulse 93   Temp 97.5 F (36.4 C) (Oral)   Ht 5\' 4"  (1.626 m)   Wt 201 lb 6.4 oz (91.4 kg)   BMI 34.57 kg/m   Wt Readings from Last 3 Encounters:  05/13/16 201 lb 6.4 oz (91.4 kg)  04/29/16 206 lb (93.4 kg)  04/15/16 206 lb (93.4 kg)    Gen: NAD, alert, cooperative with exam, NCAT EYES: EOMI, no conjunctival injection, or no icterus ENT:  OP without erythema LYMPH: no cervical LAD CV: NRRR, normal S1/S2, no murmur, distal pulses 2+ b/l Resp: CTABL, no wheezes, normal WOB Abd: +BS, soft, NTND. no guarding or organomegaly, no CVA tenderness Ext: No edema, warm Neuro: Alert and oriented, strength equal b/l UE and LE, coordination grossly normal MSK: normal muscle bulk  Assessment & Plan:  Ruvi was seen today for emesis, headahce, abdominal pain and urinary retention.  Diagnoses and all orders for this visit:  Dysuria UA + RBCs No pain or discomfort now No fevers UA without WBC Bacteria is seen Will treat for cystitis as  below Has f/u appt in 1 week, needs repeat UA then, discussed with pt Any worsening of symptoms RTC sooner -     Urinalysis, Complete -     Urine culture -     Microscopic Examination  Cystitis -     nitrofurantoin, macrocrystal-monohydrate, (MACROBID) 100 MG capsule; Take 1 capsule (100 mg total) by mouth 2 (two) times daily. -     Urine culture   Follow up plan: 1 week as scheduled Assunta Found, MD [place]

## 2016-05-15 LAB — URINE CULTURE: Organism ID, Bacteria: NO GROWTH

## 2016-05-16 ENCOUNTER — Encounter: Payer: BLUE CROSS/BLUE SHIELD | Admitting: Physical Therapy

## 2016-05-17 DIAGNOSIS — M1611 Unilateral primary osteoarthritis, right hip: Secondary | ICD-10-CM | POA: Diagnosis not present

## 2016-05-17 DIAGNOSIS — Z96652 Presence of left artificial knee joint: Secondary | ICD-10-CM | POA: Diagnosis not present

## 2016-05-17 DIAGNOSIS — Z96641 Presence of right artificial hip joint: Secondary | ICD-10-CM | POA: Diagnosis not present

## 2016-05-17 DIAGNOSIS — Z471 Aftercare following joint replacement surgery: Secondary | ICD-10-CM | POA: Diagnosis not present

## 2016-05-18 ENCOUNTER — Ambulatory Visit: Payer: BLUE CROSS/BLUE SHIELD | Admitting: Physical Therapy

## 2016-05-18 ENCOUNTER — Encounter: Payer: Self-pay | Admitting: Physical Therapy

## 2016-05-18 DIAGNOSIS — M25511 Pain in right shoulder: Secondary | ICD-10-CM

## 2016-05-18 DIAGNOSIS — M25611 Stiffness of right shoulder, not elsewhere classified: Secondary | ICD-10-CM | POA: Diagnosis not present

## 2016-05-18 NOTE — Therapy (Signed)
[facility] [place], [place], [address] Phone: [phone]   Fax:  [phone]  Physical Therapy Treatment  Patient Details  Name: Leslie Robbins MRN: [id number] Date of Birth: 1955-06-15 Referring Provider: Claretta Fraise MD.  Encounter Date: 05/18/2016      PT End of Session - 05/18/16 1518    Visit Number 7   Number of Visits 12   Date for PT Re-Evaluation 06/02/16   PT Start Time 6387   PT Stop Time 1530   PT Time Calculation (min) 46 min   Activity Tolerance Patient tolerated treatment well   Behavior During Therapy [facility] for tasks assessed/performed      Past Medical History:  Diagnosis Date  . Anxiety   . Arthritis   . Depression   . GERD (gastroesophageal reflux disease)   . Hypercholesteremia   . Hypertension   . Hypothyroidism     Past Surgical History:  Procedure Laterality Date  . ABDOMINAL HYSTERECTOMY    . BREAST LUMPECTOMY     benign  . CARDIAC CATHETERIZATION     9 YRS AGO  . COLONOSCOPY N/A 05/30/2012   Procedure: COLONOSCOPY;  Surgeon: Rogene Houston, MD;  [facility];  Service: Endoscopy;  Laterality: N/A;  830-moved to 1125 Ann to notify pt  . DILATION AND CURETTAGE OF UTERUS     X2  . Right Carpal Tunnel Release    . 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   Procedure: LEFT TOTAL KNEE ARTHROPLASTY;  Surgeon: Gearlean Alf, MD;  Location: [facility];  Service: Orthopedics;  Laterality: Left;  . TUBAL LIGATION      There were no vitals filed for this visit.      Subjective Assessment - 05/18/16 1447    Subjective Patient reported 70% improvement overall yet ongoing pain in shoulder   Patient Stated Goals Use right UE without pain.   Currently in Pain? Yes   Pain Score 4   up to 5/64 with certain movements   Pain Location Shoulder   Pain Orientation Left   Pain Descriptors / Indicators Sore   Pain Type Acute pain   Pain Onset More than a month ago   Pain Frequency  Intermittent   Aggravating Factors  activity/work   Pain Relieving Factors rest            [facility] PT Assessment - 05/18/16 0001      AROM   Right/Left Shoulder Right   Right Shoulder External Rotation 70 Degrees     PROM   Right/Left Shoulder Right   Right Shoulder External Rotation 80 Degrees                     [facility] Adult PT Treatment/Exercise - 05/18/16 0001      Shoulder Exercises: Standing   Protraction Strengthening;Right;Theraband;20 reps   Theraband Level (Shoulder Protraction) Level 1 (Yellow)   External Rotation Strengthening;Right;20 reps;Theraband   Theraband Level (Shoulder External Rotation) Level 1 (Yellow)   Internal Rotation Strengthening;Right;Theraband;20 reps   Theraband Level (Shoulder Internal Rotation) Level 1 (Yellow)   Flexion Strengthening;Right;AROM   Flexion Limitations 2x10   Row Strengthening;Right;20 reps;Theraband   Theraband Level (Shoulder Row) Level 1 (Yellow)     Shoulder Exercises: Pulleys   Flexion Other (comment)  13min   Other Pulley Exercises UE ranger for elevation 2x10     Modalities   Modalities Vasopneumatic     Moist Heat Therapy   Number Minutes Moist Heat --  Moist Heat Location --     Acupuncturist Location L shoulder   Electrical Stimulation Action IFC   Electrical Stimulation Parameters 80-150hz  x93min   Electrical Stimulation Goals Pain;Tone     Ultrasound   Ultrasound Location rt prox bicep   Ultrasound Parameters 1.5w/cm2/50%/56mhz x53min   Ultrasound Goals Pain     Vasopneumatic   Number Minutes Vasopneumatic  15 minutes   Vasopnuematic Location  Shoulder   Vasopneumatic Pressure Medium                     PT Long Term Goals - 05/18/16 1519      PT LONG TERM GOAL #1   Title Independent with a HEP.   Time 6   Period Weeks   Status On-going     PT LONG TERM GOAL #2   Title Active right shoulder ER to 80 degrees+ to allow for easily  donning/doffing of apparel.   Time 6   Period Weeks   Status --  AROM 70 degrees 05/18/16     PT LONG TERM GOAL #3   Title Increase right shoulder strength to a solid 5/5 to increase stability for performance of functional activities   Time 6   Period Weeks   Status On-going     PT LONG TERM GOAL #4   Title Perform ADL's with right shoulder pain not > 2-3/10.   Time 6   Period Weeks   Status On-going               Plan - 05/18/16 1519    Clinical Impression Statement Patient tolerated treatment well today. Patient progressing with PRE's and has no complaints with exercises. Patient improved with ER today active and passively. Patient reported 70% improvement overall. Patient has more pain with activity and ADL's. Patient current goals ongoing due to pain, ROM and strength deficts.   Rehab Potential Excellent   PT Frequency 2x / week   PT Duration 6 weeks   PT Treatment/Interventions ADLs/Self Care Home Management;Cryotherapy;Electrical Stimulation;Moist Heat;Ultrasound;Therapeutic exercise;Therapeutic activities;Manual techniques;Passive range of motion;Dry needling;Vasopneumatic Device   PT Next Visit Plan cont with POC per MD. Stacks appt 05/20/16   Consulted and Agree with Plan of Care Patient      Patient will benefit from skilled therapeutic intervention in order to improve the following deficits and impairments:  Decreased range of motion, Decreased activity tolerance, Decreased strength, Pain  Visit Diagnosis: Acute pain of right shoulder  Stiffness of right shoulder, not elsewhere classified     Problem List Patient Active Problem List   Diagnosis Date Noted  . Bursitis, hip 07/27/2015  . Lichen sclerosus of female genitalia 12/30/2013  . OA (osteoarthritis) of knee 09/16/2013  . GERD (gastroesophageal reflux disease) 07/11/2013  . GAD (generalized anxiety disorder) 07/11/2013  . Depression with anxiety 07/11/2013  . Hyperlipidemia with target LDL less  than 100 07/11/2013  . Hypothyroidism 07/11/2013  . Essential hypertension 12/19/2008    Ladean Raya, PTA 05/18/16 5:41 PM Mali Applegate MPT [facility] [place], [place], [address] Phone: [phone]   Fax:  [phone]  Name: Leslie Robbins MRN: [id number] Date of Birth: Feb 05, 1956

## 2016-05-20 ENCOUNTER — Encounter: Payer: Self-pay | Admitting: Family Medicine

## 2016-05-20 ENCOUNTER — Ambulatory Visit (INDEPENDENT_AMBULATORY_CARE_PROVIDER_SITE_OTHER): Payer: BLUE CROSS/BLUE SHIELD | Admitting: Family Medicine

## 2016-05-20 VITALS — BP 126/83 | HR 82 | Temp 97.6°F | Ht 64.0 in | Wt 206.0 lb

## 2016-05-20 DIAGNOSIS — Z87440 Personal history of urinary (tract) infections: Secondary | ICD-10-CM

## 2016-05-20 DIAGNOSIS — M7501 Adhesive capsulitis of right shoulder: Secondary | ICD-10-CM | POA: Diagnosis not present

## 2016-05-20 LAB — URINALYSIS, COMPLETE
Bilirubin, UA: NEGATIVE
Glucose, UA: NEGATIVE
Nitrite, UA: NEGATIVE
PH UA: 6 (ref 5.0–7.5)
PROTEIN UA: NEGATIVE
SPEC GRAV UA: 1.015 (ref 1.005–1.030)
Urobilinogen, Ur: 0.2 mg/dL (ref 0.2–1.0)

## 2016-05-20 LAB — MICROSCOPIC EXAMINATION

## 2016-05-20 NOTE — Progress Notes (Signed)
Subjective:  Patient ID: Leslie Robbins, female    DOB: Aug 15, 1955  Age: 61 y.o. MRN: [id number]  CC: Shoulder Pain (pt here today following up on her shoulder pain which has improved by 70% per pt. She does PT twice a week.)   HPI Leslie Robbins presents for Just went back to work on January 15. The right shoulder began hurting almost immediately. She does have a lot of repetitive motion at work. Pain has been Present ever since. It hurts to lift the arm both for flexion and abduction. Occasional pain is actually in the flexor surface of the upper arm from deltoid to elbow It is exacerbated by external rotation. Pain is 70% better. She is getting physical therapy twice a week and that seems to be helping. History Leslie Robbins has a past medical history of Anxiety; Arthritis; Depression; GERD (gastroesophageal reflux disease); Hypercholesteremia; Hypertension; and Hypothyroidism.   She has a past surgical history that includes Abdominal hysterectomy; Tubal ligation; Right Carpal Tunnel Release; Breast lumpectomy; Colonoscopy (N/A, 05/30/2012); Dilation and curettage of uterus; Cardiac catheterization; and Total knee arthroplasty (Left, 09/16/2013).   Her family history includes Dementia in her mother; Depression in her sister; Fibromyalgia in her sister; Hypertension in her mother; Lupus in her mother.She reports that she has never smoked. She has never used smokeless tobacco. She reports that she does not drink alcohol or use drugs.    ROS Review of Systems  Constitutional: Negative for fever.  HENT: Negative for congestion, rhinorrhea and sore throat.   Respiratory: Negative for cough and shortness of breath.   Cardiovascular: Negative for chest pain and palpitations.  Gastrointestinal: Negative for abdominal pain.  Musculoskeletal: Positive for arthralgias and myalgias.    Objective:  BP 126/83   Pulse 82   Temp 97.6 F (36.4 C) (Oral)   Ht 5\' 4"  (1.626 m)   Wt 206 lb (93.4 kg)   BMI 35.36  kg/m   BP Readings from Last 3 Encounters:  05/20/16 126/83  05/13/16 118/79  04/29/16 133/86    Wt Readings from Last 3 Encounters:  05/20/16 206 lb (93.4 kg)  05/13/16 201 lb 6.4 oz (91.4 kg)  04/29/16 206 lb (93.4 kg)     Physical Exam  Constitutional: She appears well-developed and well-nourished.  HENT:  Head: Normocephalic.  Cardiovascular: Normal rate and regular rhythm.   No murmur heard. Pulmonary/Chest: Effort normal and breath sounds normal.  Musculoskeletal: She exhibits tenderness (at biceps tendon proximally. Tender for external rotation of shoulder.). She exhibits no edema or deformity.  There is decreased abduction to 30 and decreased flexion to 45 of the right upper extremity. There is painful external rotation as well.    No results found.  Assessment & Plan:   Leslie Robbins was seen today for shoulder pain.  Diagnoses and all orders for this visit:  Adhesive capsulitis of right shoulder -     Ambulatory referral to [place]; Future  History of UTI -     Urinalysis, Complete  Other orders -     Microscopic Examination      I am having Leslie Robbins maintain her aspirin, RESTASIS MULTIDOSE, PAZEO, meloxicam, benazepril-hydrochlorthiazide, valACYclovir, clorazepate, rosuvastatin, omeprazole, levothyroxine, amLODipine, and DULoxetine. We will continue to administer cyanocobalamin.  Allergies as of 05/20/2016      Reactions   Sulfonamide Derivatives Rash      Medication List       Accurate as of 05/20/16  5:42 PM. Always use your most recent med list.          amLODipine 2.5 MG tablet Commonly known as:  NORVASC TAKE 1 TABLET EVERY DAY   aspirin 81 MG tablet Take 81 mg by mouth daily. Reported on 07/27/2015   benazepril-hydrochlorthiazide 20-25 MG tablet Commonly known as:  LOTENSIN HCT Take 1 tablet by mouth daily.   clorazepate 3.75 MG tablet Commonly known as:  TRANXENE Take 1 tablet (3.75 mg total)  by mouth 2 (two) times daily.   DULoxetine 30 MG capsule Commonly known as:  CYMBALTA TAKE 3 CAPSULES (90 MG TOTAL) BY MOUTH DAILY.   levothyroxine 112 MCG tablet Commonly known as:  SYNTHROID, LEVOTHROID TAKE 1 TABLET (112 MCG TOTAL) BY MOUTH DAILY.   meloxicam 15 MG tablet Commonly known as:  MOBIC Take 1 tablet (15 mg total) by mouth daily.   omeprazole 40 MG capsule Commonly known as:  PRILOSEC TAKE 1 CAPSULE (40 MG TOTAL) BY MOUTH DAILY.   PAZEO 0.7 % Soln Generic drug:  Olopatadine HCl INTO ONE DROP INTO EACH EYE EVERY MORNING   RESTASIS MULTIDOSE 0.05 % ophthalmic emulsion Generic drug:  cycloSPORINE INSTILL 1 DROP INTO EACH EYE TWICE DAILY   rosuvastatin 20 MG tablet Commonly known as:  CRESTOR Take 1 tablet (20 mg total) by mouth daily.   valACYclovir 1000 MG tablet Commonly known as:  VALTREX Take 1 tablet (1,000 mg total) by mouth daily.      Based on her limited range of motion in spite of improvement in pain and in spite of good response to physical therapy, I believe she has a capsulitis with frozen shoulder and will need to be followed by orthopedics. MRI of the shoulder has been ordered and she has appointment to see Dr. Theda Sers of orthopedics in 1 week.  Follow-up: Return in about 3 months (around 08/20/2016) for Hypothyroidism.  Claretta Fraise, M.D.

## 2016-05-24 ENCOUNTER — Ambulatory Visit: Payer: BLUE CROSS/BLUE SHIELD | Admitting: Physical Therapy

## 2016-05-24 ENCOUNTER — Encounter: Payer: Self-pay | Admitting: Physical Therapy

## 2016-05-24 DIAGNOSIS — M25511 Pain in right shoulder: Secondary | ICD-10-CM | POA: Diagnosis not present

## 2016-05-24 DIAGNOSIS — M25611 Stiffness of right shoulder, not elsewhere classified: Secondary | ICD-10-CM | POA: Diagnosis not present

## 2016-05-24 NOTE — Therapy (Signed)
[facility] [place], [place], [address] Phone: [phone]   Fax:  [phone]  Physical Therapy Treatment  Patient Details  Name: Leslie Robbins MRN: [id number] Date of Birth: Nov 12, 1955 Referring Provider: Claretta Fraise MD.  Encounter Date: 05/24/2016      PT End of Session - 05/24/16 1517    Visit Number 8   Number of Visits 12   Date for PT Re-Evaluation 06/02/16   PT Start Time 0076   PT Stop Time 1607   PT Time Calculation (min) 52 min   Activity Tolerance Patient tolerated treatment well   Behavior During Therapy [facility] for tasks assessed/performed      Past Medical History:  Diagnosis Date  . Anxiety   . Arthritis   . Depression   . GERD (gastroesophageal reflux disease)   . Hypercholesteremia   . Hypertension   . Hypothyroidism     Past Surgical History:  Procedure Laterality Date  . ABDOMINAL HYSTERECTOMY    . BREAST LUMPECTOMY     benign  . CARDIAC CATHETERIZATION     9 YRS AGO  . COLONOSCOPY N/A 05/30/2012   Procedure: COLONOSCOPY;  Surgeon: Rogene Houston, MD;  [facility];  Service: Endoscopy;  Laterality: N/A;  830-moved to 1125 Ann to notify pt  . DILATION AND CURETTAGE OF UTERUS     X2  . Right Carpal Tunnel Release    . 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   Procedure: LEFT TOTAL KNEE ARTHROPLASTY;  Surgeon: Gearlean Alf, MD;  Location: [facility];  Service: Orthopedics;  Laterality: Left;  . TUBAL LIGATION      There were no vitals filed for this visit.      Subjective Assessment - 05/24/16 1514    Subjective Reports that she has been having difficulty with R shoulder today and now soreness has began in the R shoulder. Reports that she is going for an MRI tomorrow and sees the orthopedic Friday. Reports that at times she has a dull ache intermittantly down to L hand. Reports a painful pop in R shoulder this morning. Reports that pain has gone into her neck and cannot reach  behind her back.   Patient Stated Goals Use right UE without pain.   Currently in Pain? Yes   Pain Score 4    Pain Location Shoulder   Pain Orientation Right   Pain Type Acute pain   Pain Onset More than a month ago   Pain Frequency Intermittent   Aggravating Factors  Certain movements   Multiple Pain Sites Yes   Pain Score 2   Pain Location Shoulder   Pain Orientation Left   Pain Type Acute pain   Pain Frequency Intermittent            [facility] PT Assessment - 05/24/16 0001      Assessment   Medical Diagnosis Bicipital tendonitis of right shoulder.   Onset Date/Surgical Date 03/22/16   Next MD Visit 05/27/2016  Dr. Theda Sers     Precautions   Precautions None     Restrictions   Weight Bearing Restrictions No                     [facility] Adult PT Treatment/Exercise - 05/24/16 0001      Shoulder Exercises: Standing   Protraction Strengthening;Right;Theraband;20 reps   Theraband Level (Shoulder Protraction) Level 1 (Yellow)   External Rotation Strengthening;Right;20 reps;Theraband   Theraband Level (Shoulder External Rotation) Level  1 (Yellow)   Internal Rotation Strengthening;Right;Theraband;20 reps   Theraband Level (Shoulder Internal Rotation) Level 1 (Yellow)   Extension Strengthening;Right;20 reps;Theraband   Theraband Level (Shoulder Extension) Level 1 (Yellow)   Row Strengthening;Right;20 reps;Theraband   Theraband Level (Shoulder Row) Level 1 (Yellow)     Shoulder Exercises: Pulleys   Flexion Other (comment)  x5 min   Other Pulley Exercises UE ranger for elevation 2x10   Other Pulley Exercises RUE wall ladder x10 reps     Modalities   Modalities Electrical Stimulation;Moist Heat     Moist Heat Therapy   Number Minutes Moist Heat 15 Minutes   Moist Heat Location Shoulder     Electrical Stimulation   Electrical Stimulation Location L shoulder   Electrical Stimulation Action IFC   Electrical Stimulation Parameters 1-10 hz x15 min   Electrical  Stimulation Goals Pain;Tone     Manual Therapy   Manual Therapy Soft tissue mobilization   Soft tissue mobilization STW to L UT to reduce tone                     PT Long Term Goals - 05/18/16 1519      PT LONG TERM GOAL #1   Title Independent with a HEP.   Time 6   Period Weeks   Status On-going     PT LONG TERM GOAL #2   Title Active right shoulder ER to 80 degrees+ to allow for easily donning/doffing of apparel.   Time 6   Period Weeks   Status --  AROM 70 degrees 05/18/16     PT LONG TERM GOAL #3   Title Increase right shoulder strength to a solid 5/5 to increase stability for performance of functional activities   Time 6   Period Weeks   Status On-going     PT LONG TERM GOAL #4   Title Perform ADL's with right shoulder pain not > 2-3/10.   Time 6   Period Weeks   Status On-going               Plan - 05/24/16 1551    Clinical Impression Statement Patient tolerated today's treatment fair as she continues to experience L shoulder pain especially anterior shoulder. Patient presented in treatment with KT tape placed over R Bicep region and along R UT as she usually does for work per patient report. R shoulder weakness noted with patient actively attempting shoulder flexion and shoulder hiking also noted with R shoulder abduction. Patient experiences pain especially with elbow flexion and shoulder flexion. Patient most tender around R Bicep muscle belly. Increased tone noted along R UT with moderate release during manual therapy. Normal modalities response noted following removal of the modalities.   Rehab Potential Excellent   PT Frequency 2x / week   PT Duration 6 weeks   PT Treatment/Interventions ADLs/Self Care Home Management;Cryotherapy;Electrical Stimulation;Moist Heat;Ultrasound;Therapeutic exercise;Therapeutic activities;Manual techniques;Passive range of motion;Dry needling;Vasopneumatic Device   PT Next Visit Plan Continue with MPT POC. Sees Dr.  Theda Sers 05/27/2016.   Consulted and Agree with Plan of Care Patient      Patient will benefit from skilled therapeutic intervention in order to improve the following deficits and impairments:  Decreased range of motion, Decreased activity tolerance, Decreased strength, Pain  Visit Diagnosis: Acute pain of right shoulder  Stiffness of right shoulder, not elsewhere classified     Problem List Patient Active Problem List   Diagnosis Date Noted  . Adhesive capsulitis of right shoulder 05/20/2016  .  Bursitis, hip 07/27/2015  . Lichen sclerosus of female genitalia 12/30/2013  . OA (osteoarthritis) of knee 09/16/2013  . GERD (gastroesophageal reflux disease) 07/11/2013  . GAD (generalized anxiety disorder) 07/11/2013  . Depression with anxiety 07/11/2013  . Hyperlipidemia with target LDL less than 100 07/11/2013  . Hypothyroidism 07/11/2013  . Essential hypertension 12/19/2008    Wynelle Fanny, PTA 05/24/2016, 4:24 PM  [facility] [place], [place], [address] Phone: [phone]   Fax:  [phone]  Name: Leslie Robbins MRN: [id number] Date of Birth: 01-Dec-1955

## 2016-05-25 ENCOUNTER — Ambulatory Visit (HOSPITAL_COMMUNITY)
Admission: RE | Admit: 2016-05-25 | Discharge: 2016-05-25 | Disposition: A | Discharge status: Home / Self Care | Payer: BLUE CROSS/BLUE SHIELD | Source: Ambulatory Visit | Attending: Family Medicine | Admitting: Family Medicine

## 2016-05-25 DIAGNOSIS — M24011 Loose body in right shoulder: Secondary | ICD-10-CM | POA: Diagnosis not present

## 2016-05-25 DIAGNOSIS — M7501 Adhesive capsulitis of right shoulder: Secondary | ICD-10-CM | POA: Diagnosis not present

## 2016-05-25 DIAGNOSIS — M25711 Osteophyte, right shoulder: Secondary | ICD-10-CM | POA: Diagnosis not present

## 2016-05-25 DIAGNOSIS — M25511 Pain in right shoulder: Secondary | ICD-10-CM | POA: Diagnosis not present

## 2016-05-25 DIAGNOSIS — M25411 Effusion, right shoulder: Secondary | ICD-10-CM | POA: Insufficient documentation

## 2016-05-25 DIAGNOSIS — M65811 Other synovitis and tenosynovitis, right shoulder: Secondary | ICD-10-CM | POA: Diagnosis not present

## 2016-05-25 DIAGNOSIS — M75101 Unspecified rotator cuff tear or rupture of right shoulder, not specified as traumatic: Secondary | ICD-10-CM | POA: Insufficient documentation

## 2016-05-26 ENCOUNTER — Ambulatory Visit: Payer: BLUE CROSS/BLUE SHIELD | Admitting: Physical Therapy

## 2016-05-26 ENCOUNTER — Encounter: Payer: Self-pay | Admitting: Physical Therapy

## 2016-05-26 DIAGNOSIS — M25611 Stiffness of right shoulder, not elsewhere classified: Secondary | ICD-10-CM

## 2016-05-26 DIAGNOSIS — M25511 Pain in right shoulder: Secondary | ICD-10-CM | POA: Diagnosis not present

## 2016-05-26 NOTE — Therapy (Signed)
[facility] [place], [place], [address] Phone: [phone]   Fax:  [phone]  Physical Therapy Treatment  Patient Details  Name: Leslie Robbins MRN: [id number] Date of Birth: [birth year] Referring Provider: Claretta Fraise MD.  Encounter Date: 05/26/2016      PT End of Session - 05/26/16 0954    Visit Number 9   Number of Visits 12   Date for PT Re-Evaluation 06/02/16   PT Start Time 0902   PT Stop Time 0949   PT Time Calculation (min) 47 min   Activity Tolerance Patient tolerated treatment well   Behavior During Therapy [facility] for tasks assessed/performed      Past Medical History:  Diagnosis Date  . Anxiety   . Arthritis   . Depression   . GERD (gastroesophageal reflux disease)   . Hypercholesteremia   . Hypertension   . Hypothyroidism     Past Surgical History:  Procedure Laterality Date  . ABDOMINAL HYSTERECTOMY    . BREAST LUMPECTOMY     benign  . CARDIAC CATHETERIZATION     9 YRS AGO  . COLONOSCOPY N/A 05/30/2012   Procedure: COLONOSCOPY;  Surgeon: Rogene Houston, MD;  [facility];  Service: Endoscopy;  Laterality: N/A;  830-moved to 1125 Ann to notify pt  . DILATION AND CURETTAGE OF UTERUS     X2  . Right Carpal Tunnel Release    . 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   Procedure: LEFT TOTAL KNEE ARTHROPLASTY;  Surgeon: Gearlean Alf, MD;  Location: [facility];  Service: Orthopedics;  Laterality: Left;  . TUBAL LIGATION      There were no vitals filed for this visit.      Subjective Assessment - 05/26/16 0902    Subjective Reports that she had an MRI last night and the results will be sent to Dr. Livia Snellen then Dr. Theda Sers. States that she has a pillow with vibrators in it and she took the vibrators out and put in on her shoulder and that helped. Reports that massage and going to tanning bed following previous treatment helped with pain.   Patient Stated Goals Use right UE without pain.    Currently in Pain? Yes   Pain Score 3    Pain Location Shoulder   Pain Orientation Right   Pain Descriptors / Indicators Discomfort   Pain Type Acute pain   Pain Onset More than a month ago            [facility] PT Assessment - 05/26/16 0001      Assessment   Medical Diagnosis Bicipital tendonitis of right shoulder.   Onset Date/Surgical Date 03/22/16   Next MD Visit 05/27/2016  Dr. Theda Sers     Precautions   Precautions None     Restrictions   Weight Bearing Restrictions No                     [facility] Adult PT Treatment/Exercise - 05/26/16 0001      Shoulder Exercises: Standing   Protraction Strengthening;Right;Theraband;20 reps   Theraband Level (Shoulder Protraction) Level 1 (Yellow)   External Rotation Strengthening;Right;20 reps;Theraband   Theraband Level (Shoulder External Rotation) Level 1 (Yellow)   Internal Rotation Strengthening;Right;Theraband;20 reps   Theraband Level (Shoulder Internal Rotation) Level 1 (Yellow)   Extension Strengthening;Right;20 reps;Theraband   Theraband Level (Shoulder Extension) Level 1 (Yellow)     Shoulder Exercises: Pulleys   Flexion Other (comment)  x6 min  Other Pulley Exercises RUE ranger flex x5 reps but stopped secondary to pain   Other Pulley Exercises RUE wall ladder x10 reps     Modalities   Modalities Electrical Stimulation;Moist Heat     Moist Heat Therapy   Number Minutes Moist Heat 15 Minutes   Moist Heat Location Shoulder     Electrical Stimulation   Electrical Stimulation Location L shoulder   Electrical Stimulation Action IFC   Electrical Stimulation Parameters 1-10 hz x15 min   Electrical Stimulation Goals Pain;Tone     Manual Therapy   Manual Therapy Myofascial release   Myofascial Release IASTW to R Bicep to reduce adhesions, tone and pain                     PT Long Term Goals - 05/18/16 1519      PT LONG TERM GOAL #1   Title Independent with a HEP.   Time 6   Period Weeks    Status On-going     PT LONG TERM GOAL #2   Title Active right shoulder ER to 80 degrees+ to allow for easily donning/doffing of apparel.   Time 6   Period Weeks   Status --  AROM 70 degrees 05/18/16     PT LONG TERM GOAL #3   Title Increase right shoulder strength to a solid 5/5 to increase stability for performance of functional activities   Time 6   Period Weeks   Status On-going     PT LONG TERM GOAL #4   Title Perform ADL's with right shoulder pain not > 2-3/10.   Time 6   Period Weeks   Status On-going               Plan - 05/26/16 4854    Clinical Impression Statement Patient tolerated today's treatment fairly well as she arrived with reduced R UT tone but had more pain in anterior and posteriolateral R humerus region. Patient limited with UE ranger today secondary to R shoulder pain in Bicep region. Weakness in RUE noted with theraband strengthening especially in ER as well as corner stretch where R Bicep was jumping during corner stretch. IASTW completed to R Bicep to reduce tone and pain with moderate petiche response observed. Normal modalities response noted following removal of the modalities. Patient experienced reduced pull in R Bicep region although pain still present following end of treatment.   Rehab Potential Excellent   PT Frequency 2x / week   PT Duration 6 weeks   PT Treatment/Interventions ADLs/Self Care Home Management;Cryotherapy;Electrical Stimulation;Moist Heat;Ultrasound;Therapeutic exercise;Therapeutic activities;Manual techniques;Passive range of motion;Dry needling;Vasopneumatic Device   PT Next Visit Plan Continue with MPT POC. Sees Dr. Theda Sers 05/27/2016.   Consulted and Agree with Plan of Care Patient      Patient will benefit from skilled therapeutic intervention in order to improve the following deficits and impairments:  Decreased range of motion, Decreased activity tolerance, Decreased strength, Pain  Visit Diagnosis: Acute pain of  right shoulder  Stiffness of right shoulder, not elsewhere classified     Problem List Patient Active Problem List   Diagnosis Date Noted  . Adhesive capsulitis of right shoulder 05/20/2016  . Bursitis, hip 07/27/2015  . Lichen sclerosus of female genitalia 12/30/2013  . OA (osteoarthritis) of knee 09/16/2013  . GERD (gastroesophageal reflux disease) 07/11/2013  . GAD (generalized anxiety disorder) 07/11/2013  . Depression with anxiety 07/11/2013  . Hyperlipidemia with target LDL less than 100 07/11/2013  . Hypothyroidism 07/11/2013  .  Essential hypertension 12/19/2008    Wynelle Fanny, PTA 05/26/2016, 10:00 AM  [facility] [place], [place], [address] Phone: [phone]   Fax:  [phone]  Name: KARLE DESROSIER MRN: [id number] Date of Birth: 23-Jan-1956

## 2016-05-27 DIAGNOSIS — M19011 Primary osteoarthritis, right shoulder: Secondary | ICD-10-CM | POA: Diagnosis not present

## 2016-05-27 DIAGNOSIS — M25512 Pain in left shoulder: Secondary | ICD-10-CM | POA: Diagnosis not present

## 2016-06-01 ENCOUNTER — Encounter: Payer: BLUE CROSS/BLUE SHIELD | Admitting: *Deleted

## 2016-06-01 DIAGNOSIS — Z1231 Encounter for screening mammogram for malignant neoplasm of breast: Secondary | ICD-10-CM | POA: Diagnosis not present

## 2016-06-02 DIAGNOSIS — M19011 Primary osteoarthritis, right shoulder: Secondary | ICD-10-CM | POA: Diagnosis not present

## 2016-06-06 ENCOUNTER — Ambulatory Visit (INDEPENDENT_AMBULATORY_CARE_PROVIDER_SITE_OTHER): Payer: BLUE CROSS/BLUE SHIELD | Admitting: Family Medicine

## 2016-06-06 ENCOUNTER — Encounter: Payer: Self-pay | Admitting: Family Medicine

## 2016-06-06 VITALS — BP 109/79 | HR 83 | Temp 97.9°F | Ht 64.0 in | Wt 200.0 lb

## 2016-06-06 DIAGNOSIS — I10 Essential (primary) hypertension: Secondary | ICD-10-CM | POA: Diagnosis not present

## 2016-06-06 DIAGNOSIS — E782 Mixed hyperlipidemia: Secondary | ICD-10-CM | POA: Diagnosis not present

## 2016-06-06 DIAGNOSIS — Z01818 Encounter for other preprocedural examination: Secondary | ICD-10-CM

## 2016-06-06 DIAGNOSIS — E039 Hypothyroidism, unspecified: Secondary | ICD-10-CM | POA: Diagnosis not present

## 2016-06-06 DIAGNOSIS — M7501 Adhesive capsulitis of right shoulder: Secondary | ICD-10-CM

## 2016-06-06 NOTE — H&P (Signed)
Leslie Robbins is an 61 y.o. female.    Chief Complaint: right shoulder pain and weakness  HPI: Pt is a 61 y.o. female complaining of right shoulder pain for multiple years. Pain had continually increased since the beginning. X-rays in the clinic show end-stage arthritic changes of the right shoulder. Pt has tried various conservative treatments which have failed to alleviate their symptoms, including injections and therapy. Various options are discussed with the patient. Risks, benefits and expectations were discussed with the patient. Patient understand the risks, benefits and expectations and wishes to proceed with surgery.   PCP:  Claretta Fraise, MD  D/C Plans: Home  PMH: Past Medical History:  Diagnosis Date  . Anxiety   . Arthritis   . Depression   . GERD (gastroesophageal reflux disease)   . Hypercholesteremia   . Hypertension   . Hypothyroidism     PSH: Past Surgical History:  Procedure Laterality Date  . ABDOMINAL HYSTERECTOMY    . BREAST LUMPECTOMY     benign  . CARDIAC CATHETERIZATION     9 YRS AGO  . COLONOSCOPY N/A 05/30/2012   Procedure: COLONOSCOPY;  Surgeon: Rogene Houston, MD;  [facility];  Service: Endoscopy;  Laterality: N/A;  830-moved to 1125 Ann to notify pt  . DILATION AND CURETTAGE OF UTERUS     X2  . Right Carpal Tunnel Release    . 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   Procedure: LEFT TOTAL KNEE ARTHROPLASTY;  Surgeon: Gearlean Alf, MD;  Location: [facility];  Service: Orthopedics;  Laterality: Left;  . TUBAL LIGATION      Social History:  reports that she has never smoked. She has never used smokeless tobacco. She reports that she does not drink alcohol or use drugs.  Allergies:  Allergies  Allergen Reactions  . Sulfonamide Derivatives Rash    Medications: Current Facility-Administered Medications  Medication Dose Route Frequency Provider Last Rate Last Dose  . cyanocobalamin ((VITAMIN B-12)) injection 1,000 mcg  1,000 mcg  Intramuscular Q30 days Claretta Fraise, MD   1,000 mcg at 04/29/16 1700   Current Outpatient Prescriptions  Medication Sig Dispense Refill  . amLODipine (NORVASC) 2.5 MG tablet TAKE 1 TABLET EVERY DAY 90 tablet 0  . aspirin 81 MG tablet Take 81 mg by mouth daily. Reported on 07/27/2015    . benazepril-hydrochlorthiazide (LOTENSIN HCT) 20-25 MG tablet Take 1 tablet by mouth daily. 90 tablet 3  . clorazepate (TRANXENE) 3.75 MG tablet Take 1 tablet (3.75 mg total) by mouth 2 (two) times daily. 60 tablet 5  . DULoxetine (CYMBALTA) 30 MG capsule TAKE 3 CAPSULES (90 MG TOTAL) BY MOUTH DAILY. 90 capsule 0  . levothyroxine (SYNTHROID, LEVOTHROID) 112 MCG tablet TAKE 1 TABLET (112 MCG TOTAL) BY MOUTH DAILY. 30 tablet 10  . meloxicam (MOBIC) 15 MG tablet Take 1 tablet (15 mg total) by mouth daily. 90 tablet 3  . omeprazole (PRILOSEC) 40 MG capsule TAKE 1 CAPSULE (40 MG TOTAL) BY MOUTH DAILY. 90 capsule 0  . PAZEO 0.7 % SOLN INTO ONE DROP INTO EACH EYE EVERY MORNING  3  . RESTASIS MULTIDOSE 0.05 % ophthalmic emulsion INSTILL 1 DROP INTO EACH EYE TWICE DAILY  3  . rosuvastatin (CRESTOR) 20 MG tablet Take 1 tablet (20 mg total) by mouth daily. 90 tablet 1  . valACYclovir (VALTREX) 1000 MG tablet Take 1 tablet (1,000 mg total) by mouth daily. 28 tablet 1    No results found for this or any previous visit (  from the past 48 hour(s)). No results found.  ROS: Pain with rom of the right upper extremity  Physical Exam:  Alert and oriented 61 y.o. female in no acute distress Cranial nerves 2-12 intact Cervical spine: full rom with no tenderness, nv intact distally Chest: active breath sounds bilaterally, no wheeze rhonchi or rales Heart: regular rate and rhythm, no murmur Abd: non tender non distended with active bowel sounds Hip is stable with rom  Right shoulder with crepitus with rom rom restricted mildly Strength of ER and IR 4.5/5  No rashes or edema  Assessment/Plan Assessment: right shoulder  end stage osteoarthritis  Plan: Patient will undergo a right total shoulder arthroplasty by Dr. Veverly Fells at [facility]. Risks benefits and expectations were discussed with the patient. Patient understand risks, benefits and expectations and wishes to proceed.

## 2016-06-06 NOTE — Progress Notes (Signed)
Subjective:  Patient ID: Leslie Robbins, female    DOB: 05-31-1955  Age: 61 y.o. MRN: [id number]  CC: Pre-op Exam (pt here today for surgical clearance)   HPI Leslie Robbins presents for Six-month recheck as well as surgical clearance. She is having a right shoulder replacement. MRI reviewed. Shows extensive damage to wear and tear of the glenohumeral joint and the rotator cuff.  Patient in for follow-up of elevated cholesterol. Doing well without complaints on current medication. Denies side effects of statin including myalgia and arthralgia and nausea. Also in today for liver function testing. Currently no chest pain, shortness of breath or other cardiovascular related symptoms noted.  follow-up of hypertension. Patient has no history of headache chest pain or shortness of breath or recent cough. Patient also denies symptoms of TIA such as numbness weakness lateralizing. Patient checks  blood pressure at home and has not had any elevated readings recently. Patient denies side effects from his medication. States taking it regularly. Patient presents for follow-up on  thyroid. The patient has a history of hypothyroidism for many years. It has been stable recently. Pt. denies any change in  voice, loss of hair, heat or cold intolerance. Energy level has been adequate to good. Patient denies constipation and diarrhea. No myxedema. Medication is as noted below. Verified that pt is taking it daily on an empty stomach. Well tolerated.  History Leslie Robbins has a past medical history of Anxiety; Arthritis; Depression; GERD (gastroesophageal reflux disease); Hypercholesteremia; Hypertension; and Hypothyroidism.   She has a past surgical history that includes Abdominal hysterectomy; Tubal ligation; Right Carpal Tunnel Release; Breast lumpectomy; Colonoscopy (N/A, 05/30/2012); Dilation and curettage of uterus; Cardiac catheterization; and Total knee arthroplasty (Left, 09/16/2013).   Her family history includes  Dementia in her mother; Depression in her sister; Fibromyalgia in her sister; Hypertension in her mother; Lupus in her mother.She reports that she has never smoked. She has never used smokeless tobacco. She reports that she does not drink alcohol or use drugs.    ROS Review of Systems  Objective:  BP 109/79   Pulse 83   Temp 97.9 F (36.6 C) (Oral)   Ht _0  (1.626 m)   Wt 200 lb (90.7 kg)   BMI 34.33 kg/m   BP Readings from Last 3 Encounters:  06/06/16 109/79  05/20/16 126/83  05/13/16 118/79    Wt Readings from Last 3 Encounters:  06/06/16 200 lb (90.7 kg)  05/20/16 206 lb (93.4 kg)  05/13/16 201 lb 6.4 oz (91.4 kg)     Physical Exam    Assessment & Plan:   Leslie Robbins was seen today for pre-op exam.  Diagnoses and all orders for this visit:  Essential hypertension -     CBC with Differential/Platelet -     CMP14+EGFR  Hypothyroidism, unspecified type -     CBC with Differential/Platelet -     CMP14+EGFR -     TSH + free T4  Adhesive capsulitis of right shoulder -     CBC with Differential/Platelet -     CMP14+EGFR  Pre-op evaluation -     EKG 12-Lead -     CBC with Differential/Platelet -     CMP14+EGFR  Mixed hyperlipidemia -     Lipid panel       I am having Leslie Robbins maintain her aspirin, RESTASIS MULTIDOSE, PAZEO, meloxicam, benazepril-hydrochlorthiazide, valACYclovir, clorazepate, rosuvastatin, omeprazole, levothyroxine, amLODipine, and DULoxetine. We will continue to administer cyanocobalamin.  Allergies as of 06/06/2016  Reactions   Sulfonamide Derivatives Rash      Medication List       Accurate as of 06/06/16  4:47 PM. Always use your most recent med list.          amLODipine 2.5 MG tablet Commonly known as:  NORVASC TAKE 1 TABLET EVERY DAY   aspirin 81 MG tablet Take 81 mg by mouth daily. Reported on 07/27/2015   benazepril-hydrochlorthiazide 20-25 MG tablet Commonly known as:  LOTENSIN HCT Take 1 tablet by mouth daily.    clorazepate 3.75 MG tablet Commonly known as:  TRANXENE Take 1 tablet (3.75 mg total) by mouth 2 (two) times daily.   DULoxetine 30 MG capsule Commonly known as:  CYMBALTA TAKE 3 CAPSULES (90 MG TOTAL) BY MOUTH DAILY.   levothyroxine 112 MCG tablet Commonly known as:  SYNTHROID, LEVOTHROID TAKE 1 TABLET (112 MCG TOTAL) BY MOUTH DAILY.   meloxicam 15 MG tablet Commonly known as:  MOBIC Take 1 tablet (15 mg total) by mouth daily.   omeprazole 40 MG capsule Commonly known as:  PRILOSEC TAKE 1 CAPSULE (40 MG TOTAL) BY MOUTH DAILY.   PAZEO 0.7 % Soln Generic drug:  Olopatadine HCl INTO ONE DROP INTO EACH EYE EVERY MORNING   RESTASIS MULTIDOSE 0.05 % ophthalmic emulsion Generic drug:  cycloSPORINE INSTILL 1 DROP INTO EACH EYE TWICE DAILY   rosuvastatin 20 MG tablet Commonly known as:  CRESTOR Take 1 tablet (20 mg total) by mouth daily.   valACYclovir 1000 MG tablet Commonly known as:  VALTREX Take 1 tablet (1,000 mg total) by mouth daily.     EKG shows no acute changes. Unchanged from previous tracing of 3 years ago. Follow-up: Return in about 6 months (around 12/06/2016).  Claretta Fraise, M.D.

## 2016-06-07 ENCOUNTER — Other Ambulatory Visit: Payer: Self-pay | Admitting: Orthopedic Surgery

## 2016-06-07 LAB — LIPID PANEL
CHOL/HDL RATIO: 2.3 ratio (ref 0.0–4.4)
Cholesterol, Total: 137 mg/dL (ref 100–199)
HDL: 59 mg/dL (ref >39)
LDL CALC: 58 mg/dL (ref 0–99)
Triglycerides: 99 mg/dL (ref 0–149)
VLDL CHOLESTEROL CAL: 20 mg/dL (ref 5–40)

## 2016-06-07 LAB — TSH+FREE T4
FREE T4: 1.43 ng/dL (ref 0.82–1.77)
TSH: 0.443 u[IU]/mL — AB (ref 0.450–4.500)

## 2016-06-07 LAB — CBC WITH DIFFERENTIAL/PLATELET
Basophils Absolute: 0.1 10*3/uL (ref 0.0–0.2)
Basos: 1 %
EOS (ABSOLUTE): 0.2 10*3/uL (ref 0.0–0.4)
Eos: 2 %
Hematocrit: 39.7 % (ref 34.0–46.6)
Hemoglobin: 12.8 g/dL (ref 11.1–15.9)
IMMATURE GRANS (ABS): 0 10*3/uL (ref 0.0–0.1)
Immature Granulocytes: 0 %
LYMPHS: 26 %
Lymphocytes Absolute: 2 10*3/uL (ref 0.7–3.1)
MCH: 26.7 pg (ref 26.6–33.0)
MCHC: 32.2 g/dL (ref 31.5–35.7)
MCV: 83 fL (ref 79–97)
Monocytes Absolute: 0.4 10*3/uL (ref 0.1–0.9)
Monocytes: 6 %
NEUTROS ABS: 4.8 10*3/uL (ref 1.4–7.0)
Neutrophils: 65 %
Platelets: 281 10*3/uL (ref 150–379)
RBC: 4.79 x10E6/uL (ref 3.77–5.28)
RDW: 15.5 % — ABNORMAL HIGH (ref 12.3–15.4)
WBC: 7.4 10*3/uL (ref 3.4–10.8)

## 2016-06-07 LAB — CMP14+EGFR
ALT: 20 IU/L (ref 0–32)
AST: 20 IU/L (ref 0–40)
Albumin/Globulin Ratio: 1.5 (ref 1.2–2.2)
Albumin: 4.2 g/dL (ref 3.6–4.8)
Alkaline Phosphatase: 117 IU/L (ref 39–117)
BUN / CREAT RATIO: 29 — AB (ref 12–28)
BUN: 31 mg/dL — AB (ref 8–27)
Bilirubin Total: 0.4 mg/dL (ref 0.0–1.2)
CO2: 26 mmol/L (ref 18–29)
CREATININE: 1.07 mg/dL — AB (ref 0.57–1.00)
Calcium: 10.1 mg/dL (ref 8.7–10.3)
Chloride: 101 mmol/L (ref 96–106)
GFR calc non Af Amer: 55 mL/min/{1.73_m2} — ABNORMAL LOW (ref >59)
GFR, EST AFRICAN AMERICAN: 64 mL/min/{1.73_m2} (ref >59)
GLOBULIN, TOTAL: 2.8 g/dL (ref 1.5–4.5)
Glucose: 79 mg/dL (ref 65–99)
Potassium: 4.6 mmol/L (ref 3.5–5.2)
SODIUM: 142 mmol/L (ref 134–144)
TOTAL PROTEIN: 7 g/dL (ref 6.0–8.5)

## 2016-06-13 ENCOUNTER — Encounter (HOSPITAL_COMMUNITY)
Admission: RE | Admit: 2016-06-13 | Discharge: 2016-06-13 | Disposition: A | Discharge status: Home / Self Care | Payer: BLUE CROSS/BLUE SHIELD | Source: Ambulatory Visit | Attending: Orthopedic Surgery | Admitting: Orthopedic Surgery

## 2016-06-13 ENCOUNTER — Encounter (HOSPITAL_COMMUNITY): Payer: Self-pay

## 2016-06-13 DIAGNOSIS — M7501 Adhesive capsulitis of right shoulder: Secondary | ICD-10-CM | POA: Insufficient documentation

## 2016-06-13 DIAGNOSIS — Z01812 Encounter for preprocedural laboratory examination: Secondary | ICD-10-CM | POA: Diagnosis not present

## 2016-06-13 DIAGNOSIS — Z01818 Encounter for other preprocedural examination: Secondary | ICD-10-CM | POA: Diagnosis not present

## 2016-06-13 HISTORY — DX: Personal history of other diseases of the digestive system: Z87.19

## 2016-06-13 LAB — BASIC METABOLIC PANEL
Anion gap: 7 (ref 5–15)
BUN: 20 mg/dL (ref 6–20)
CALCIUM: 10.2 mg/dL (ref 8.9–10.3)
CHLORIDE: 105 mmol/L (ref 101–111)
CO2: 25 mmol/L (ref 22–32)
CREATININE: 1.06 mg/dL — AB (ref 0.44–1.00)
GFR calc non Af Amer: 55 mL/min — ABNORMAL LOW (ref >60)
Glucose, Bld: 85 mg/dL (ref 65–99)
Potassium: 3.9 mmol/L (ref 3.5–5.1)
SODIUM: 137 mmol/L (ref 135–145)

## 2016-06-13 LAB — CBC
HCT: 41.5 % (ref 36.0–46.0)
Hemoglobin: 13.4 g/dL (ref 12.0–15.0)
MCH: 27.2 pg (ref 26.0–34.0)
MCHC: 32.3 g/dL (ref 30.0–36.0)
MCV: 84.3 fL (ref 78.0–100.0)
PLATELETS: 227 10*3/uL (ref 150–400)
RBC: 4.92 MIL/uL (ref 3.87–5.11)
RDW: 15.5 % (ref 11.5–15.5)
WBC: 6.8 10*3/uL (ref 4.0–10.5)

## 2016-06-13 LAB — SURGICAL PCR SCREEN
MRSA, PCR: NEGATIVE
STAPHYLOCOCCUS AUREUS: NEGATIVE

## 2016-06-13 NOTE — Pre-Procedure Instructions (Signed)
Leslie Robbins  06/13/2016      [id number] - [place], [place] - [address] [place] [address] Phone: [phone] Fax: [phone]    Your procedure is scheduled on April 13  Report to [facility] Admitting at call at 530 or 600   Call this number if you have problems the morning of surgery:  [phone]   Remember:  Do not eat food or drink liquids after midnight.  Take these medicines the morning of surgery with A SIP OF WATER Amlodipine (Norvasc), Cetirizine (Zyrtec), if needed, clorazepate (Tranxene) if needed, Duloxetine (Cymbalta), Levothyroxine (Synthroid)  Stop/ Take aspirin as directed by your Dr. Stop taking BC's, Goody's, Fish Oil, Herbal medications, Vitamins, Ibuprofen, Advil, Motrin, Aleve, Mobic   Do not wear jewelry, make-up or nail polish.  Do not wear lotions, powders, or perfumes, or deoderant.  Do not shave 48 hours prior to surgery.  Men may shave face and neck.  Do not bring valuables to the hospital.  [facility] is not responsible for any belongings or valuables.  Contacts, dentures or bridgework may not be worn into surgery.  Leave your suitcase in the car.  After surgery it may be brought to your room.  For patients admitted to the hospital, discharge time will be determined by your treatment team.  Patients discharged the day of surgery will not be allowed to drive home.    Special instructions:   - Preparing for Surgery  Before surgery, you can play an important role.  Because skin is not sterile, your skin needs to be as free of germs as possible.  You can reduce the number of germs on you skin by washing with CHG (chlorahexidine gluconate) soap before surgery.  CHG is an antiseptic cleaner which kills germs and bonds with the skin to continue killing germs even after washing.  Please DO NOT use if you have an allergy to CHG or antibacterial soaps.  If your skin becomes  reddened/irritated stop using the CHG and inform your nurse when you arrive at Short Stay.  Do not shave (including legs and underarms) for at least 48 hours prior to the first CHG shower.  You may shave your face.  Please follow these instructions carefully:   1.  Shower with CHG Soap the night before surgery and the   morning of Surgery.  2.  If you choose to wash your hair, wash your hair first as usual with your   normal shampoo.  3.  After you shampoo, rinse your hair and body thoroughly to remove the   Shampoo.  4.  Use CHG as you would any other liquid soap.  You can apply chg directly to the skin and wash gently with scrungie or a clean washcloth.  5.  Apply the CHG Soap to your body ONLY FROM THE NECK DOWN.   Do not use on open wounds or open sores.  Avoid contact with your eyes,  ears, mouth and genitals (private parts).  Wash genitals (private parts) with your normal soap.  6.  Wash thoroughly, paying special attention to the area where your surgery will be performed.  7.  Thoroughly rinse your body with warm water from the neck down.  8.  DO NOT shower/wash with your normal soap after using and rinsing off the CHG Soap.  9.  Pat yourself dry with a clean towel.  10.  Wear clean pajamas.            11.  Place clean sheets on your bed the night of your first shower and do not sleep with pets.  Day of Surgery  Do not apply any lotions/deoderants the morning of surgery.  Please wear clean clothes to the hospital/surgery center.    Please read over the following fact sheets that you were given. Pain Booklet, Coughing and Deep Breathing, MRSA Information and Surgical Site Infection Prevention

## 2016-06-13 NOTE — Progress Notes (Addendum)
PCP is Dr. Claretta Fraise States she saw a cardiologist about 10 years ago.  States she had a card cath, echo, and stress test 10 years ago. States everything was good, It was anxiety. Denies any chest pain, Fever, or cough. States she was instructed to stop taking aspirin 5 days prior to surgery. Instructed Leslie Robbins to call April 13th to find out what time to be here for her surgery- voices understanding (teach back).

## 2016-06-14 ENCOUNTER — Inpatient Hospital Stay (HOSPITAL_COMMUNITY): Admission: RE | Admit: 2016-06-14 | Payer: BLUE CROSS/BLUE SHIELD | Source: Ambulatory Visit

## 2016-06-16 ENCOUNTER — Other Ambulatory Visit: Payer: Self-pay | Admitting: *Deleted

## 2016-06-16 MED ORDER — CEFAZOLIN SODIUM-DEXTROSE 2-4 GM/100ML-% IV SOLN
2.0000 g | INTRAVENOUS | Status: AC
  Administered 2016-06-17: 2 g via INTRAVENOUS
  Filled 2016-06-16: qty 100

## 2016-06-16 MED ORDER — DULOXETINE HCL 30 MG PO CPEP: 90.0000 mg | ORAL_CAPSULE | Freq: Every day | ORAL | 0 refills | Status: DC

## 2016-06-17 ENCOUNTER — Inpatient Hospital Stay (HOSPITAL_COMMUNITY): Payer: BLUE CROSS/BLUE SHIELD | Admitting: Anesthesiology

## 2016-06-17 ENCOUNTER — Encounter (HOSPITAL_COMMUNITY)
Admission: RE | Disposition: A | Discharge status: Home Health | Payer: Self-pay | Source: Ambulatory Visit | Attending: Orthopedic Surgery

## 2016-06-17 ENCOUNTER — Encounter (HOSPITAL_COMMUNITY): Payer: Self-pay | Admitting: *Deleted

## 2016-06-17 ENCOUNTER — Inpatient Hospital Stay (HOSPITAL_COMMUNITY)
Admission: RE | Admit: 2016-06-17 | Discharge: 2016-06-18 | DRG: 483 | Disposition: A | Discharge status: Home Health | Payer: BLUE CROSS/BLUE SHIELD | Source: Ambulatory Visit | Attending: Orthopedic Surgery | Admitting: Orthopedic Surgery

## 2016-06-17 ENCOUNTER — Inpatient Hospital Stay (HOSPITAL_COMMUNITY): Payer: BLUE CROSS/BLUE SHIELD

## 2016-06-17 DIAGNOSIS — Z96611 Presence of right artificial shoulder joint: Secondary | ICD-10-CM

## 2016-06-17 DIAGNOSIS — Z7982 Long term (current) use of aspirin: Secondary | ICD-10-CM

## 2016-06-17 DIAGNOSIS — Z471 Aftercare following joint replacement surgery: Secondary | ICD-10-CM | POA: Diagnosis not present

## 2016-06-17 DIAGNOSIS — G8918 Other acute postprocedural pain: Secondary | ICD-10-CM | POA: Diagnosis not present

## 2016-06-17 DIAGNOSIS — I10 Essential (primary) hypertension: Secondary | ICD-10-CM | POA: Diagnosis not present

## 2016-06-17 DIAGNOSIS — K219 Gastro-esophageal reflux disease without esophagitis: Secondary | ICD-10-CM | POA: Diagnosis present

## 2016-06-17 DIAGNOSIS — M19011 Primary osteoarthritis, right shoulder: Secondary | ICD-10-CM | POA: Diagnosis not present

## 2016-06-17 DIAGNOSIS — E7800 Pure hypercholesterolemia, unspecified: Secondary | ICD-10-CM | POA: Diagnosis present

## 2016-06-17 DIAGNOSIS — R5383 Other fatigue: Secondary | ICD-10-CM

## 2016-06-17 DIAGNOSIS — M25511 Pain in right shoulder: Secondary | ICD-10-CM | POA: Diagnosis not present

## 2016-06-17 DIAGNOSIS — E039 Hypothyroidism, unspecified: Secondary | ICD-10-CM | POA: Diagnosis not present

## 2016-06-17 DIAGNOSIS — Z96652 Presence of left artificial knee joint: Secondary | ICD-10-CM | POA: Diagnosis present

## 2016-06-17 HISTORY — PX: TOTAL SHOULDER ARTHROPLASTY: SHX126

## 2016-06-17 SURGERY — ARTHROPLASTY, SHOULDER, TOTAL
Anesthesia: General | Site: Shoulder | Laterality: Right

## 2016-06-17 MED ORDER — EPINEPHRINE PF 1 MG/ML IJ SOLN
INTRAMUSCULAR | Status: AC
  Filled 2016-06-17: qty 1

## 2016-06-17 MED ORDER — PROPOFOL 10 MG/ML IV BOLUS
INTRAVENOUS | Status: AC
  Filled 2016-06-17: qty 20

## 2016-06-17 MED ORDER — HYDROMORPHONE HCL 1 MG/ML IJ SOLN
INTRAMUSCULAR | Status: AC
  Filled 2016-06-17: qty 0.5

## 2016-06-17 MED ORDER — ROSUVASTATIN CALCIUM 20 MG PO TABS
20.0000 mg | ORAL_TABLET | Freq: Every evening | ORAL | Status: DC
  Administered 2016-06-17: 20 mg via ORAL
  Filled 2016-06-17: qty 2
  Filled 2016-06-17: qty 1
  Filled 2016-06-17: qty 2
  Filled 2016-06-17: qty 1

## 2016-06-17 MED ORDER — CYCLOSPORINE 0.05 % OP EMUL
1.0000 [drp] | Freq: Two times a day (BID) | OPHTHALMIC | Status: DC
  Administered 2016-06-17: 1 [drp] via OPHTHALMIC
  Filled 2016-06-17 (×2): qty 1

## 2016-06-17 MED ORDER — VITAMIN B-12 1000 MCG PO TABS
500.0000 ug | ORAL_TABLET | Freq: Every day | ORAL | Status: DC
  Filled 2016-06-17 (×2): qty 1

## 2016-06-17 MED ORDER — ONDANSETRON HCL 4 MG/2ML IJ SOLN
INTRAMUSCULAR | Status: DC | PRN
  Administered 2016-06-17: 4 mg via INTRAVENOUS

## 2016-06-17 MED ORDER — ACETAMINOPHEN 325 MG PO TABS
650.0000 mg | ORAL_TABLET | Freq: Four times a day (QID) | ORAL | Status: DC | PRN
  Administered 2016-06-17: 650 mg via ORAL

## 2016-06-17 MED ORDER — ROCURONIUM BROMIDE 50 MG/5ML IV SOSY
PREFILLED_SYRINGE | INTRAVENOUS | Status: AC
  Filled 2016-06-17: qty 5

## 2016-06-17 MED ORDER — CEFAZOLIN SODIUM-DEXTROSE 2-4 GM/100ML-% IV SOLN
2.0000 g | Freq: Four times a day (QID) | INTRAVENOUS | Status: AC
  Administered 2016-06-17 – 2016-06-18 (×2): 2 g via INTRAVENOUS
  Filled 2016-06-17 (×3): qty 100

## 2016-06-17 MED ORDER — DEXTROSE 5 % IV SOLN
INTRAVENOUS | Status: DC | PRN
  Administered 2016-06-17: 25 ug/min via INTRAVENOUS

## 2016-06-17 MED ORDER — LEVOTHYROXINE SODIUM 112 MCG PO TABS
112.0000 ug | ORAL_TABLET | Freq: Every day | ORAL | Status: DC
  Administered 2016-06-18: 112 ug via ORAL
  Filled 2016-06-17: qty 1

## 2016-06-17 MED ORDER — DEXAMETHASONE SODIUM PHOSPHATE 10 MG/ML IJ SOLN
INTRAMUSCULAR | Status: DC | PRN
  Administered 2016-06-17: 10 mg via INTRAVENOUS

## 2016-06-17 MED ORDER — 0.9 % SODIUM CHLORIDE (POUR BTL) OPTIME
TOPICAL | Status: DC | PRN
  Administered 2016-06-17: 1000 mL

## 2016-06-17 MED ORDER — BUPIVACAINE-EPINEPHRINE 0.25% -1:200000 IJ SOLN
INTRAMUSCULAR | Status: DC | PRN
  Administered 2016-06-17: 10 mL

## 2016-06-17 MED ORDER — ROCURONIUM BROMIDE 100 MG/10ML IV SOLN
INTRAVENOUS | Status: DC | PRN
  Administered 2016-06-17: 30 mg via INTRAVENOUS

## 2016-06-17 MED ORDER — OXYCODONE HCL 5 MG PO TABS
ORAL_TABLET | ORAL | Status: AC
  Administered 2016-06-17: 10 mg via ORAL
  Filled 2016-06-17: qty 2

## 2016-06-17 MED ORDER — ONDANSETRON HCL 4 MG PO TABS: 4.0000 mg | ORAL_TABLET | Freq: Four times a day (QID) | ORAL | Status: DC | PRN

## 2016-06-17 MED ORDER — METHOCARBAMOL 500 MG PO TABS: 500.0000 mg | ORAL_TABLET | Freq: Three times a day (TID) | ORAL | 1 refills | Status: DC | PRN

## 2016-06-17 MED ORDER — LIDOCAINE HCL (CARDIAC) 20 MG/ML IV SOLN
INTRAVENOUS | Status: DC | PRN
  Administered 2016-06-17: 60 mg via INTRAVENOUS

## 2016-06-17 MED ORDER — ONDANSETRON HCL 4 MG/2ML IJ SOLN: 4.0000 mg | Freq: Four times a day (QID) | INTRAMUSCULAR | Status: DC | PRN

## 2016-06-17 MED ORDER — OXYCODONE HCL 5 MG PO TABS
5.0000 mg | ORAL_TABLET | ORAL | Status: DC | PRN
  Administered 2016-06-17 – 2016-06-18 (×5): 10 mg via ORAL
  Filled 2016-06-17 (×4): qty 2

## 2016-06-17 MED ORDER — ACETAMINOPHEN 325 MG PO TABS: 650.0000 mg | ORAL_TABLET | Freq: Every day | ORAL | Status: DC | PRN

## 2016-06-17 MED ORDER — BUPIVACAINE HCL (PF) 0.25 % IJ SOLN
INTRAMUSCULAR | Status: AC
  Filled 2016-06-17: qty 30

## 2016-06-17 MED ORDER — CYANOCOBALAMIN 1000 MCG/ML IJ SOLN: 1000.0000 ug | INTRAMUSCULAR | Status: DC

## 2016-06-17 MED ORDER — BISACODYL 10 MG RE SUPP: 10.0000 mg | Freq: Every day | RECTAL | Status: DC | PRN

## 2016-06-17 MED ORDER — HYDROMORPHONE HCL 1 MG/ML IJ SOLN
0.2500 mg | INTRAMUSCULAR | Status: DC | PRN
  Administered 2016-06-17: 0.5 mg via INTRAVENOUS

## 2016-06-17 MED ORDER — CHLORHEXIDINE GLUCONATE 4 % EX LIQD: 60.0000 mL | Freq: Once | CUTANEOUS | Status: DC

## 2016-06-17 MED ORDER — FENTANYL CITRATE (PF) 100 MCG/2ML IJ SOLN
INTRAMUSCULAR | Status: DC | PRN
  Administered 2016-06-17: 50 ug via INTRAVENOUS

## 2016-06-17 MED ORDER — VALACYCLOVIR HCL 500 MG PO TABS: 1000.0000 mg | ORAL_TABLET | Freq: Every day | ORAL | Status: DC | PRN

## 2016-06-17 MED ORDER — MIDAZOLAM HCL 2 MG/2ML IJ SOLN
INTRAMUSCULAR | Status: AC
  Administered 2016-06-17: 2 mg via INTRAVENOUS
  Filled 2016-06-17: qty 2

## 2016-06-17 MED ORDER — BUPIVACAINE-EPINEPHRINE (PF) 0.5% -1:200000 IJ SOLN
INTRAMUSCULAR | Status: DC | PRN
  Administered 2016-06-17: 25 mL via PERINEURAL

## 2016-06-17 MED ORDER — PHENOL 1.4 % MT LIQD: 1.0000 | OROMUCOSAL | Status: DC | PRN

## 2016-06-17 MED ORDER — METHOCARBAMOL 500 MG PO TABS
ORAL_TABLET | ORAL | Status: AC
  Administered 2016-06-17: 500 mg via ORAL
  Filled 2016-06-17: qty 1

## 2016-06-17 MED ORDER — LACTATED RINGERS IV SOLN
INTRAVENOUS | Status: DC
  Administered 2016-06-17: 11:00:00 via INTRAVENOUS

## 2016-06-17 MED ORDER — ACETAMINOPHEN 325 MG PO TABS
ORAL_TABLET | ORAL | Status: AC
  Administered 2016-06-17: 650 mg via ORAL
  Filled 2016-06-17: qty 2

## 2016-06-17 MED ORDER — PANTOPRAZOLE SODIUM 40 MG PO TBEC
80.0000 mg | DELAYED_RELEASE_TABLET | Freq: Every day | ORAL | Status: DC
  Administered 2016-06-17 – 2016-06-18 (×2): 80 mg via ORAL
  Filled 2016-06-17 (×2): qty 2

## 2016-06-17 MED ORDER — MENTHOL 3 MG MT LOZG: 1.0000 | LOZENGE | OROMUCOSAL | Status: DC | PRN

## 2016-06-17 MED ORDER — ASPIRIN EC 81 MG PO TBEC
81.0000 mg | DELAYED_RELEASE_TABLET | Freq: Every day | ORAL | Status: DC
  Administered 2016-06-18: 81 mg via ORAL
  Filled 2016-06-17: qty 1

## 2016-06-17 MED ORDER — LIDOCAINE 2% (20 MG/ML) 5 ML SYRINGE
INTRAMUSCULAR | Status: AC
  Filled 2016-06-17: qty 5

## 2016-06-17 MED ORDER — MIDAZOLAM HCL 2 MG/2ML IJ SOLN
2.0000 mg | Freq: Once | INTRAMUSCULAR | Status: AC
  Administered 2016-06-17: 2 mg via INTRAVENOUS

## 2016-06-17 MED ORDER — POLYETHYLENE GLYCOL 3350 17 G PO PACK: 17.0000 g | PACK | Freq: Every day | ORAL | Status: DC | PRN

## 2016-06-17 MED ORDER — THROMBIN 5000 UNITS EX SOLR
CUTANEOUS | Status: DC | PRN
  Administered 2016-06-17: 5 mL via TOPICAL

## 2016-06-17 MED ORDER — OMEGA-3-ACID ETHYL ESTERS 1 G PO CAPS
1.0000 g | ORAL_CAPSULE | Freq: Every day | ORAL | Status: DC
  Administered 2016-06-18: 1 g via ORAL
  Filled 2016-06-17: qty 1

## 2016-06-17 MED ORDER — HYDROCHLOROTHIAZIDE 25 MG PO TABS
25.0000 mg | ORAL_TABLET | Freq: Every day | ORAL | Status: DC
  Filled 2016-06-17: qty 1

## 2016-06-17 MED ORDER — SODIUM CHLORIDE 0.9 % IV SOLN
INTRAVENOUS | Status: DC
  Administered 2016-06-17: 21:00:00 via INTRAVENOUS

## 2016-06-17 MED ORDER — METOCLOPRAMIDE HCL 5 MG PO TABS: 5.0000 mg | ORAL_TABLET | Freq: Three times a day (TID) | ORAL | Status: DC | PRN

## 2016-06-17 MED ORDER — PROPOFOL 10 MG/ML IV BOLUS
INTRAVENOUS | Status: DC | PRN
  Administered 2016-06-17: 100 mg via INTRAVENOUS

## 2016-06-17 MED ORDER — FENTANYL CITRATE (PF) 100 MCG/2ML IJ SOLN
INTRAMUSCULAR | Status: AC
  Administered 2016-06-17: 50 ug via INTRAVENOUS
  Filled 2016-06-17: qty 2

## 2016-06-17 MED ORDER — DOCUSATE SODIUM 100 MG PO CAPS
100.0000 mg | ORAL_CAPSULE | Freq: Two times a day (BID) | ORAL | Status: DC
  Administered 2016-06-17 – 2016-06-18 (×2): 100 mg via ORAL
  Filled 2016-06-17 (×2): qty 1

## 2016-06-17 MED ORDER — THROMBIN 5000 UNITS EX SOLR
CUTANEOUS | Status: AC
  Filled 2016-06-17: qty 5000

## 2016-06-17 MED ORDER — METHOCARBAMOL 1000 MG/10ML IJ SOLN: 500.0000 mg | Freq: Four times a day (QID) | INTRAVENOUS | Status: DC | PRN

## 2016-06-17 MED ORDER — OXYCODONE-ACETAMINOPHEN 5-325 MG PO TABS: 1.0000 | ORAL_TABLET | ORAL | 0 refills | Status: DC | PRN

## 2016-06-17 MED ORDER — FENTANYL CITRATE (PF) 100 MCG/2ML IJ SOLN
50.0000 ug | Freq: Once | INTRAMUSCULAR | Status: AC
  Administered 2016-06-17: 50 ug via INTRAVENOUS

## 2016-06-17 MED ORDER — DULOXETINE HCL 60 MG PO CPEP
90.0000 mg | ORAL_CAPSULE | Freq: Every day | ORAL | Status: DC
  Administered 2016-06-18: 90 mg via ORAL
  Filled 2016-06-17: qty 1

## 2016-06-17 MED ORDER — VITAMIN D 1000 UNITS PO TABS
2000.0000 [IU] | ORAL_TABLET | Freq: Every day | ORAL | Status: DC
  Administered 2016-06-18: 2000 [IU] via ORAL
  Filled 2016-06-17: qty 2

## 2016-06-17 MED ORDER — AMLODIPINE BESYLATE 2.5 MG PO TABS
2.5000 mg | ORAL_TABLET | Freq: Every day | ORAL | Status: DC
  Filled 2016-06-17: qty 1

## 2016-06-17 MED ORDER — LORATADINE 10 MG PO TABS
10.0000 mg | ORAL_TABLET | Freq: Every day | ORAL | Status: DC
  Administered 2016-06-18: 10 mg via ORAL
  Filled 2016-06-17: qty 1

## 2016-06-17 MED ORDER — METHOCARBAMOL 500 MG PO TABS
500.0000 mg | ORAL_TABLET | Freq: Four times a day (QID) | ORAL | Status: DC | PRN
  Administered 2016-06-17 – 2016-06-18 (×3): 500 mg via ORAL
  Filled 2016-06-17 (×2): qty 1

## 2016-06-17 MED ORDER — ACETAMINOPHEN 650 MG RE SUPP: 650.0000 mg | Freq: Four times a day (QID) | RECTAL | Status: DC | PRN

## 2016-06-17 MED ORDER — METOCLOPRAMIDE HCL 5 MG/ML IJ SOLN
5.0000 mg | Freq: Three times a day (TID) | INTRAMUSCULAR | Status: DC | PRN
Start: 2016-06-17 — End: 2016-06-18

## 2016-06-17 MED ORDER — BENAZEPRIL-HYDROCHLOROTHIAZIDE 20-25 MG PO TABS: 1.0000 | ORAL_TABLET | Freq: Every day | ORAL | Status: DC

## 2016-06-17 MED ORDER — FENTANYL CITRATE (PF) 250 MCG/5ML IJ SOLN
INTRAMUSCULAR | Status: AC
  Filled 2016-06-17: qty 5

## 2016-06-17 MED ORDER — BENAZEPRIL HCL 20 MG PO TABS
20.0000 mg | ORAL_TABLET | Freq: Every day | ORAL | Status: DC
  Filled 2016-06-17: qty 1

## 2016-06-17 MED ORDER — CLORAZEPATE DIPOTASSIUM 3.75 MG PO TABS: 3.7500 mg | ORAL_TABLET | Freq: Two times a day (BID) | ORAL | Status: DC | PRN

## 2016-06-17 MED ORDER — MELOXICAM 7.5 MG PO TABS
15.0000 mg | ORAL_TABLET | Freq: Every day | ORAL | Status: DC
  Administered 2016-06-17 – 2016-06-18 (×2): 15 mg via ORAL
  Filled 2016-06-17: qty 2
  Filled 2016-06-17 (×2): qty 1
  Filled 2016-06-17 (×2): qty 2

## 2016-06-17 MED ORDER — MORPHINE SULFATE (PF) 2 MG/ML IV SOLN: 2.0000 mg | INTRAVENOUS | Status: DC | PRN

## 2016-06-17 SURGICAL SUPPLY — 78 items
BIT DRILL 5/64X5 DISP (BIT) ×1 IMPLANT
BLADE SAW SAG 73X25 THK (BLADE) ×1
BLADE SAW SGTL 73X25 THK (BLADE) ×1 IMPLANT
BUR SURG 4X8 MED (BURR) IMPLANT
BURR SURG 4X8 MED (BURR)
CAPT SHLDR TOTAL 2 ×1 IMPLANT
CEMENT BONE DEPUY (Cement) ×2 IMPLANT
COVER SURGICAL LIGHT HANDLE (MISCELLANEOUS) ×2 IMPLANT
DRAPE IMP U-DRAPE 54X76 (DRAPES) ×2 IMPLANT
DRAPE INCISE IOBAN 66X45 STRL (DRAPES) ×4 IMPLANT
DRAPE ORTHO SPLIT 77X108 STRL (DRAPES) ×4
DRAPE SURG ORHT 6 SPLT 77X108 (DRAPES) ×2 IMPLANT
DRAPE U-SHAPE 47X51 STRL (DRAPES) ×2 IMPLANT
DRAPE X-RAY CASS 24X20 (DRAPES) IMPLANT
DRSG ADAPTIC 3X8 NADH LF (GAUZE/BANDAGES/DRESSINGS) ×2 IMPLANT
DRSG PAD ABDOMINAL 8X10 ST (GAUZE/BANDAGES/DRESSINGS) ×2 IMPLANT
DURAPREP 26ML APPLICATOR (WOUND CARE) ×2 IMPLANT
ELECT BLADE 4.0 EZ CLEAN MEGAD (MISCELLANEOUS) ×2
ELECT NDL TIP 2.8 STRL (NEEDLE) ×1 IMPLANT
ELECT NEEDLE TIP 2.8 STRL (NEEDLE) ×2 IMPLANT
ELECT REM PT RETURN 9FT ADLT (ELECTROSURGICAL) ×2
ELECTRODE BLDE 4.0 EZ CLN MEGD (MISCELLANEOUS) ×1 IMPLANT
ELECTRODE REM PT RTRN 9FT ADLT (ELECTROSURGICAL) ×1 IMPLANT
GAUZE SPONGE 4X4 12PLY STRL (GAUZE/BANDAGES/DRESSINGS) ×2 IMPLANT
GLOVE BIOGEL PI ORTHO PRO 7.5 (GLOVE) ×1
GLOVE BIOGEL PI ORTHO PRO SZ7 (GLOVE) ×1
GLOVE BIOGEL PI ORTHO PRO SZ8 (GLOVE) ×1
GLOVE ORTHO TXT STRL SZ7.5 (GLOVE) ×2 IMPLANT
GLOVE PI ORTHO PRO STRL 7.5 (GLOVE) ×1 IMPLANT
GLOVE PI ORTHO PRO STRL SZ7 (GLOVE) ×1 IMPLANT
GLOVE PI ORTHO PRO STRL SZ8 (GLOVE) ×1 IMPLANT
GLOVE SURG ORTHO 8.5 STRL (GLOVE) ×4 IMPLANT
GOWN STRL REUS W/ TWL XL LVL3 (GOWN DISPOSABLE) ×3 IMPLANT
GOWN STRL REUS W/TWL XL LVL3 (GOWN DISPOSABLE) ×6
HANDPIECE INTERPULSE COAX TIP (DISPOSABLE)
KIT BASIN OR (CUSTOM PROCEDURE TRAY) ×2 IMPLANT
KIT ROOM TURNOVER OR (KITS) ×2 IMPLANT
MANIFOLD NEPTUNE II (INSTRUMENTS) ×2 IMPLANT
NDL FILTER BLUNT 18X1 1/2 (NEEDLE) IMPLANT
NDL HYPO 25GX1X1/2 BEV (NEEDLE) ×1 IMPLANT
NDL SUT 6 .5 CRC .975X.05 MAYO (NEEDLE) ×1 IMPLANT
NEEDLE 1/2 CIR MAYO (NEEDLE) IMPLANT
NEEDLE FILTER BLUNT 18X 1/2SAF (NEEDLE) ×1
NEEDLE FILTER BLUNT 18X1 1/2 (NEEDLE) ×1 IMPLANT
NEEDLE HYPO 25GX1X1/2 BEV (NEEDLE) ×2 IMPLANT
NEEDLE MAYO TAPER (NEEDLE) ×2
NS IRRIG 1000ML POUR BTL (IV SOLUTION) ×2 IMPLANT
PACK SHOULDER (CUSTOM PROCEDURE TRAY) ×2 IMPLANT
PACK UNIVERSAL I (CUSTOM PROCEDURE TRAY) ×1 IMPLANT
PAD ARMBOARD 7.5X6 YLW CONV (MISCELLANEOUS) ×4 IMPLANT
SET HNDPC FAN SPRY TIP SCT (DISPOSABLE) IMPLANT
SLING ARM FOAM STRAP LRG (SOFTGOODS) ×1 IMPLANT
SLING ARM IMMOBILIZER LRG (SOFTGOODS) ×2 IMPLANT
SLING ARM IMMOBILIZER MED (SOFTGOODS) IMPLANT
SMARTMIX MINI TOWER (MISCELLANEOUS) ×2
SPONGE LAP 18X18 X RAY DECT (DISPOSABLE) ×2 IMPLANT
SPONGE LAP 4X18 X RAY DECT (DISPOSABLE) ×1 IMPLANT
SPONGE SURGIFOAM ABS GEL SZ50 (HEMOSTASIS) ×1 IMPLANT
STRIP CLOSURE SKIN 1/2X4 (GAUZE/BANDAGES/DRESSINGS) ×2 IMPLANT
SUCTION FRAZIER HANDLE 10FR (MISCELLANEOUS) ×1
SUCTION TUBE FRAZIER 10FR DISP (MISCELLANEOUS) ×1 IMPLANT
SUT FIBERWIRE #2 38 T-5 BLUE (SUTURE) ×10
SUT MNCRL AB 4-0 PS2 18 (SUTURE) ×2 IMPLANT
SUT VIC AB 0 CT1 27 (SUTURE) ×2
SUT VIC AB 0 CT1 27XBRD ANBCTR (SUTURE) ×1 IMPLANT
SUT VIC AB 0 CT2 27 (SUTURE) ×2 IMPLANT
SUT VIC AB 2-0 CT1 27 (SUTURE) ×2
SUT VIC AB 2-0 CT1 TAPERPNT 27 (SUTURE) ×1 IMPLANT
SUT VICRYL AB 2 0 TIES (SUTURE) ×1 IMPLANT
SUTURE FIBERWR #2 38 T-5 BLUE (SUTURE) ×2 IMPLANT
SYR CONTROL 10ML LL (SYRINGE) ×2 IMPLANT
SYR TB 1ML LUER SLIP (SYRINGE) ×1 IMPLANT
TOWEL OR 17X24 6PK STRL BLUE (TOWEL DISPOSABLE) ×2 IMPLANT
TOWEL OR 17X26 10 PK STRL BLUE (TOWEL DISPOSABLE) ×2 IMPLANT
TOWER SMARTMIX MINI (MISCELLANEOUS) ×1 IMPLANT
TRAY FOLEY W/METER SILVER 16FR (SET/KITS/TRAYS/PACK) ×1 IMPLANT
WATER STERILE IRR 1000ML POUR (IV SOLUTION) ×2 IMPLANT
YANKAUER SUCT BULB TIP NO VENT (SUCTIONS) ×1 IMPLANT

## 2016-06-17 NOTE — Anesthesia Preprocedure Evaluation (Addendum)
Anesthesia Evaluation  Patient identified by MRN, date of birth, ID band Patient awake    Reviewed: Allergy & Precautions, H&P , NPO status , Patient's Chart, lab work & pertinent test results  Airway Mallampati: II  TM Distance: >3 FB Neck ROM: Full    Dental no notable dental hx. (+) Teeth Intact, Dental Advisory Given   Pulmonary neg pulmonary ROS,    Pulmonary exam normal breath sounds clear to auscultation       Cardiovascular hypertension, Pt. on medications  Rhythm:Regular Rate:Normal     Neuro/Psych Anxiety Depression negative neurological ROS     GI/Hepatic Neg liver ROS, GERD  Medicated and Controlled,  Endo/Other  Hypothyroidism   Renal/GU negative Renal ROS  negative genitourinary   Musculoskeletal  (+) Arthritis , Osteoarthritis,    Abdominal   Peds  Hematology negative hematology ROS (+)   Anesthesia Other Findings   Reproductive/Obstetrics negative OB ROS                            Anesthesia Physical Anesthesia Plan  ASA: II  Anesthesia Plan: General   Post-op Pain Management:  Regional for Post-op pain   Induction: Intravenous  Airway Management Planned: Oral ETT  Additional Equipment:   Intra-op Plan:   Post-operative Plan: Extubation in OR  Informed Consent: I have reviewed the patients History and Physical, chart, labs and discussed the procedure including the risks, benefits and alternatives for the proposed anesthesia with the patient or authorized representative who has indicated his/her understanding and acceptance.   Dental advisory given  Plan Discussed with: CRNA  Anesthesia Plan Comments:         Anesthesia Quick Evaluation

## 2016-06-17 NOTE — Transfer of Care (Signed)
Immediate Anesthesia Transfer of Care Note  Patient: Leslie Robbins  Procedure(s) Performed: Procedure(s): RIGHT TOTAL SHOULDER ARTHROPLASTY (Right)  Patient Location: PACU  Anesthesia Type:GA combined with regional for post-op pain  Level of Consciousness: awake, alert  and oriented  Airway & Oxygen Therapy: Patient Spontanous Breathing and Patient connected to nasal cannula oxygen  Post-op Assessment: Report given to RN, Post -op Vital signs reviewed and stable and Patient moving all extremities  Post vital signs: Reviewed and stable  Last Vitals:  Vitals:   06/17/16 1103  BP: 124/75  Pulse: 77  Resp: 18  Temp: 37 C    Last Pain:  Vitals:   06/17/16 1103  TempSrc: Oral  PainSc:       Patients Stated Pain Goal: 4 (62/22/97 9892)  Complications: No apparent anesthesia complications

## 2016-06-17 NOTE — Anesthesia Procedure Notes (Signed)
Procedure Name: Intubation Date/Time: 06/17/2016 1:17 PM Performed by: Rush Farmer E Pre-anesthesia Checklist: Patient identified, Emergency Drugs available, Suction available and Patient being monitored Patient Re-evaluated:Patient Re-evaluated prior to inductionOxygen Delivery Method: Circle system utilized Preoxygenation: Pre-oxygenation with 100% oxygen Intubation Type: IV induction Ventilation: Mask ventilation without difficulty Laryngoscope Size: Mac and 3 Grade View: Grade I Tube type: Oral Tube size: 7.0 mm Number of attempts: 1 Airway Equipment and Method: Stylet Placement Confirmation: ETT inserted through vocal cords under direct vision,  positive ETCO2 and breath sounds checked- equal and bilateral Secured at: 21 cm Tube secured with: Tape Dental Injury: Teeth and Oropharynx as per pre-operative assessment

## 2016-06-17 NOTE — Anesthesia Postprocedure Evaluation (Addendum)
Anesthesia Post Note  Patient: Leslie Robbins  Procedure(s) Performed: Procedure(s) (LRB): RIGHT TOTAL SHOULDER ARTHROPLASTY (Right)  Patient location during evaluation: PACU Anesthesia Type: General and Regional Level of consciousness: awake and alert, oriented and patient cooperative Pain management: pain level controlled Vital Signs Assessment: post-procedure vital signs reviewed and stable Respiratory status: spontaneous breathing, nonlabored ventilation, respiratory function stable and patient connected to nasal cannula oxygen Cardiovascular status: blood pressure returned to baseline and stable Postop Assessment: no signs of nausea or vomiting Anesthetic complications: no       Last Vitals:  Vitals:   06/17/16 1700 06/17/16 1715  BP: 107/76 115/75  Pulse: 72 72  Resp: 14 18  Temp: 36.7 C     Last Pain:  Vitals:   06/17/16 1700  TempSrc:   PainSc: 0-No pain                 Harman Langhans,E. Tyriek Hofman

## 2016-06-17 NOTE — Anesthesia Procedure Notes (Signed)
Anesthesia Regional Block: Interscalene brachial plexus block   Pre-Anesthetic Checklist: ,, timeout performed, Correct Patient, Correct Site, Correct Laterality, Correct Procedure, Correct Position, site marked, Risks and benefits discussed, pre-op evaluation,  At surgeon's request and post-op pain management  Laterality: Right  Prep: Maximum Sterile Barrier Precautions used, chloraprep       Needles:  Injection technique: Single-shot  Needle Type: Echogenic Stimulator Needle     Needle Length: 5cm  Needle Gauge: 22     Additional Needles:   Procedures: ultrasound guided, nerve stimulator,,,,,,   Nerve Stimulator or Paresthesia:  Response: Biceps response,   Additional Responses:   Narrative:  Start time: 06/17/2016 11:28 AM End time: 06/17/2016 11:38 AM Injection made incrementally with aspirations every 5 mL. Anesthesiologist: Roderic Palau  Additional Notes: 2% Lidocaine skin wheel.

## 2016-06-17 NOTE — Discharge Instructions (Signed)
Ice to the shoulder as much as possible, keep the incision clean and dry and covered for one week, then ok to get it wet in the shower.  Do exercises 4-5 times per day for 5 minutes, lap slides. Pendulums, gentle rotation exercises (wand)  Keep pillow propped behind the right elbow to keep arm across the waist,  Use the sling while up and around, may remove while in chair or resting  Follow up in two weeks in the office [phone]

## 2016-06-17 NOTE — Interval H&P Note (Signed)
History and Physical Interval Note:  06/17/2016 12:52 PM  Leslie Robbins  has presented today for surgery, with the diagnosis of Right shoulder osteoarthritis  The various methods of treatment have been discussed with the patient and family. After consideration of risks, benefits and other options for treatment, the patient has consented to  Procedure(s): RIGHT TOTAL SHOULDER ARTHROPLASTY (Right) as a surgical intervention .  The patient's history has been reviewed, patient examined, no change in status, stable for surgery.  I have reviewed the patient's chart and labs.  Questions were answered to the patient's satisfaction.     Nazire Fruth,STEVEN R

## 2016-06-17 NOTE — Brief Op Note (Signed)
06/17/2016  3:29 PM  PATIENT:  Leslie Robbins  61 y.o. female  PRE-OPERATIVE DIAGNOSIS:  Right shoulder osteoarthritis, end stage  POST-OPERATIVE DIAGNOSIS:  Right shoulder osteoarthritis, end stage  PROCEDURE:  Procedure(s): RIGHT TOTAL SHOULDER ARTHROPLASTY (Right) Anatomic [employer]  SURGEON:  Surgeon(s) and Role:    * Netta Cedars, MD - Primary  PHYSICIAN ASSISTANT:   ASSISTANTS: Ventura Bruns, PA-C   ANESTHESIA:   regional and general  EBL:  Total I/O In: -  Out: 150 [Blood:150]  BLOOD ADMINISTERED:none  DRAINS: none   LOCAL MEDICATIONS USED:  MARCAINE     SPECIMEN:  No Specimen  DISPOSITION OF SPECIMEN:  N/A  COUNTS:  YES  TOURNIQUET:  * No tourniquets in log *  DICTATION: .Other Dictation: Dictation Number [id number]  PLAN OF CARE: Admit to inpatient   PATIENT DISPOSITION:  PACU - hemodynamically stable.   Delay start of Pharmacological VTE agent (>24hrs) due to surgical blood loss or risk of bleeding: no

## 2016-06-18 DIAGNOSIS — M19011 Primary osteoarthritis, right shoulder: Secondary | ICD-10-CM

## 2016-06-18 LAB — BASIC METABOLIC PANEL
ANION GAP: 12 (ref 5–15)
BUN: 20 mg/dL (ref 6–20)
CHLORIDE: 101 mmol/L (ref 101–111)
CO2: 26 mmol/L (ref 22–32)
Calcium: 10.1 mg/dL (ref 8.9–10.3)
Creatinine, Ser: 1.03 mg/dL — ABNORMAL HIGH (ref 0.44–1.00)
GFR calc Af Amer: >60 mL/min (ref >60)
GFR, EST NON AFRICAN AMERICAN: 57 mL/min — AB (ref >60)
Glucose, Bld: 127 mg/dL — ABNORMAL HIGH (ref 65–99)
POTASSIUM: 4.1 mmol/L (ref 3.5–5.1)
SODIUM: 139 mmol/L (ref 135–145)

## 2016-06-18 LAB — HEMOGLOBIN AND HEMATOCRIT, BLOOD
HEMATOCRIT: 36.8 % (ref 36.0–46.0)
Hemoglobin: 11.6 g/dL — ABNORMAL LOW (ref 12.0–15.0)

## 2016-06-18 NOTE — Progress Notes (Signed)
   Subjective:  Patient reports pain as mild to moderate.  No c/o. Wants to go home.  Objective:   VITALS:   Vitals:   06/17/16 1715 06/17/16 1800 06/17/16 2015 06/18/16 0435  BP: 115/75 114/78 107/64 108/74  Pulse: 72 90 94 76  Resp: 18 20 18 18   Temp:   98.8 F (37.1 C) 98.4 F (36.9 C)  TempSrc:   Oral Oral  SpO2: 95% 98% 97% 96%  Weight:      Height:        NAD ABD soft Incision: dressing C/D/I 2+ radial (+) AIN/PIN/U SILT Ax/Mc/R/U/M   Lab Results  Component Value Date   WBC 6.8 06/13/2016   HGB 11.6 (L) 06/18/2016   HCT 36.8 06/18/2016   MCV 84.3 06/13/2016   PLT 227 06/13/2016   BMET    Component Value Date/Time   NA 139 06/18/2016 0419   NA 142 06/06/2016 1650   K 4.1 06/18/2016 0419   CL 101 06/18/2016 0419   CO2 26 06/18/2016 0419   GLUCOSE 127 (H) 06/18/2016 0419   BUN 20 06/18/2016 0419   BUN 31 (H) 06/06/2016 1650   CREATININE 1.03 (H) 06/18/2016 0419   CALCIUM 10.1 06/18/2016 0419   GFRNONAA 57 (L) 06/18/2016 0419   GFRAA >60 06/18/2016 0419     Assessment/Plan: 1 Day Post-Op   Principal Problem:   Degenerative arthritis of right shoulder region Active Problems:   S/P shoulder replacement, right   NWB RUE Sling at all times, may don/doff for axillary care PO pain control OT D/C home today   Elie Goody 06/18/2016, 8:33 AM   Rod Can, MD Cell [phone]

## 2016-06-18 NOTE — Evaluation (Signed)
Occupational Therapy Evaluation and Discharge Patient Details Name: Leslie Robbins MRN: [id number] DOB: December 07, 1955 Today's Date: 06/18/2016    History of Present Illness Pt is a 61 y/o RIGHT TOTAL SHOULDER ARTHROPLASTY. Pt has a past medical history including Anxiety; Arthritis; Depression; Hypertension; Hypothyroidism; Abdominal hysterectomy; Tubal ligation; Right Carpal Tunnel Release;Cardiac catheterization; Total knee arthroplasty (Left, 09/16/2013); and THA.    Clinical Impression   PTA Pt independent in ADL and mobility - IADL limited by pain. Pt currently mod A for BUE ADL and supervision for mobility with no DME. Pt, husband, and daughter all present and alert during education for safety, compensatory strategies, and Active protocol HEP. Shoulder dc handout reviewed in full and HEP was reviewed and practiced. Family able to practice don/doff of sling with OT supervision. Pt and family verbalized/demonstrated understanding of all education and had no question at the end of the session. Please continue rehab of the shoulder as ordered by MD at follow up. Thank you for this referral.     Follow Up Recommendations  Supervision/Assistance - 24 hour    Equipment Recommendations  None recommended by OT    Recommendations for Other Services       Precautions / Restrictions Precautions Precautions: Shoulder Type of Shoulder Precautions: Active Protocol Shoulder Interventions: Shoulder sling/immobilizer;For comfort (and Sleep) Precaution Booklet Issued: Yes (comment) Precaution Comments: Shoulder DC handout reviewed in full Required Braces or Orthoses: Sling Restrictions Weight Bearing Restrictions: Yes RUE Weight Bearing: Non weight bearing Other Position/Activity Restrictions: gentle ADL OK      Mobility Bed Mobility Overal bed mobility: Modified Independent             General bed mobility comments: increased time required, No attempt to push or pull with  RUE  Transfers Overall transfer level: Modified independent Equipment used: None             General transfer comment: increased time required.     Balance Overall balance assessment: No apparent balance deficits (not formally assessed)                                         ADL either performed or assessed with clinical judgement   ADL Overall ADL's : Needs assistance/impaired Eating/Feeding: Moderate assistance;With caregiver independent assisting   Grooming: Minimal assistance;With caregiver independent assisting;Standing   Upper Body Bathing: Moderate assistance;With caregiver independent assisting;Sitting;Standing   Lower Body Bathing: Minimal assistance;With caregiver independent assisting;Sitting/lateral leans;Sit to/from stand   Upper Body Dressing : Maximal assistance;With caregiver independent assisting;Cueing for compensatory techniques;Sitting   Lower Body Dressing: Minimal assistance;With caregiver independent assisting;Sit to/from stand   Toilet Transfer: Min guard;Ambulation;Comfort height toilet   Toileting- Clothing Manipulation and Hygiene: Minimal assistance;With caregiver independent assisting;Sit to/from stand   Tub/ Shower Transfer: Walk-in shower;Min guard;Ambulation;Shower seat   Functional mobility during ADLs: Min guard General ADL Comments: Please see shoulder section for further ADL information/performance     Vision Baseline Vision/History: Wears glasses Wears Glasses: At all times Patient Visual Report: No change from baseline Vision Assessment?: No apparent visual deficits     Perception     Praxis      Pertinent Vitals/Pain Pain Assessment: 0-10 Pain Score: 4  Pain Location: R shoulder Pain Descriptors / Indicators: Discomfort;Sore Pain Intervention(s): Monitored during session;Repositioned;Ice applied     Hand Dominance Right   Extremity/Trunk Assessment Upper Extremity Assessment Upper Extremity  Assessment: RUE deficits/detail RUE  Deficits / Details: s/p surgery RUE: Unable to fully assess due to immobilization RUE Sensation: decreased light touch RUE Coordination: decreased gross motor   Lower Extremity Assessment Lower Extremity Assessment: Overall WFL for tasks assessed (Pt says she injured her R knee 2 weeks ago - sore)   Cervical / Trunk Assessment Cervical / Trunk Assessment: Normal   Communication Communication Communication: No difficulties   Cognition Arousal/Alertness: Awake/alert Behavior During Therapy: WFL for tasks assessed/performed Overall Cognitive Status: Within Functional Limits for tasks assessed                                     General Comments  Pt's husband and daughter present for all safety, education, and compensatory strategies    Exercises Exercises: Shoulder Shoulder Exercises Shoulder Flexion: PROM;AROM;Right;10 reps;Supine (Educated 0-90; Pt able to perform approx 0-45) Shoulder Extension: PROM;AROM;Right;10 reps;Supine Shoulder ABduction: PROM;AROM;Right;10 reps;Seated (Pt educated 0-60; Pt performed 0-20) Shoulder External Rotation: PROM;AROM;Right;10 reps;Seated (Pt educated 0-30, Pt able to perform to 0 today) Elbow Flexion: AROM;Right;10 reps;Standing Elbow Extension: AROM;Right;10 reps;Standing Wrist Flexion: AROM;Right Wrist Extension: AROM;Right Digit Composite Flexion: AROM;Right Composite Extension: AROM;Right Neck Flexion: AROM Neck Extension: AROM Neck Lateral Flexion - Right: AROM Neck Lateral Flexion - Left: AROM Donning/doffing shirt without moving shoulder: Maximal assistance;Caregiver independent with task;Patient able to independently direct caregiver Method for sponge bathing under operated UE: Modified independent Donning/doffing sling/immobilizer: Maximal assistance;Patient able to independently direct caregiver;Caregiver independent with task Correct positioning of sling/immobilizer: Minimal  assistance;Patient able to independently direct caregiver ROM for elbow, wrist and digits of operated UE: Modified independent Sling wearing schedule (on at all times/off for ADL's): Modified independent Proper positioning of operated UE when showering: Modified independent Positioning of UE while sleeping: Moderate assistance;Patient able to independently direct caregiver   Shoulder Instructions Shoulder Instructions Donning/doffing shirt without moving shoulder: Maximal assistance;Caregiver independent with task;Patient able to independently direct caregiver Method for sponge bathing under operated UE: Modified independent Donning/doffing sling/immobilizer: Maximal assistance;Patient able to independently direct caregiver;Caregiver independent with task Correct positioning of sling/immobilizer: Minimal assistance;Patient able to independently direct caregiver ROM for elbow, wrist and digits of operated UE: Modified independent Sling wearing schedule (on at all times/off for ADL's): Modified independent Proper positioning of operated UE when showering: Modified independent Positioning of UE while sleeping: Moderate assistance;Patient able to independently direct caregiver    Home Living Family/patient expects to be discharged to:: Private residence Living Arrangements: Spouse/significant other;Children Available Help at Discharge: Family;Available 24 hours/day Type of Home: House Home Access: Stairs to enter [employer] of Steps: 3 Entrance Stairs-Rails: Right;Left;Can reach both Home Layout: One level     Bathroom Shower/Tub: Occupational psychologist: Standard     Home Equipment: Shower seat - built in;Walker - 2 wheels;Cane - single point          Prior Functioning/Environment Level of Independence: Independent        Comments: limited by pain        OT Problem List: Decreased range of motion;Decreased strength;Decreased activity  tolerance;Decreased knowledge of use of DME or AE;Decreased knowledge of precautions;Impaired UE functional use;Pain      OT Treatment/Interventions:      OT Goals(Current goals can be found in the care plan section) Acute Rehab OT Goals Patient Stated Goal: to get home and back to work OT Goal Formulation: With patient/family Time For Goal Achievement: 07/02/16 Potential to Achieve Goals: Good  OT Frequency:     Barriers to D/C:            Co-evaluation              End of Session Nurse Communication: Mobility status;Other (comment) (OT education complete)  Activity Tolerance: Patient tolerated treatment well Patient left: in bed;with call bell/phone within reach;with family/visitor present (sitting EOB eating lunch)  OT Visit Diagnosis: Pain Pain - Right/Left: Right Pain - part of body: Shoulder                Time: 9983-3825 OT Time Calculation (min): 47 min Charges:  OT General Charges $OT Visit: 1 Procedure OT Evaluation $OT Eval Moderate Complexity: 1 Procedure OT Treatments $Self Care/Home Management : 8-22 mins $Therapeutic Exercise: 8-22 mins G-Codes:    Hulda Humphrey OTR/L [place] 06/18/2016, 1:42 PM

## 2016-06-18 NOTE — Discharge Summary (Signed)
Physician Discharge Summary  Patient ID: Leslie Robbins MRN: [id number] DOB/AGE: 03-19-1955 61 y.o.  Admit date: 06/17/2016 Discharge date: 06/18/2016  Admission Diagnoses:  Degenerative arthritis of right shoulder region  Discharge Diagnoses:  Principal Problem:   Degenerative arthritis of right shoulder region Active Problems:   S/P shoulder replacement, right   Past Medical History:  Diagnosis Date  . Anxiety   . Arthritis   . Depression   . GERD (gastroesophageal reflux disease)   . History of hiatal hernia   . Hypercholesteremia   . Hypertension   . Hypothyroidism     Surgeries: Procedure(s): RIGHT TOTAL SHOULDER ARTHROPLASTY on 06/17/2016   Consultants (if any):   Discharged Condition: Improved  Hospital Course: Leslie Robbins is an 61 y.o. female who was admitted 06/17/2016 with a diagnosis of Degenerative arthritis of right shoulder region and went to the operating room on 06/17/2016 and underwent the above named procedures.    She was given perioperative antibiotics:  Anti-infectives    Start     Dose/Rate Route Frequency Ordered Stop   06/17/16 1600  ceFAZolin (ANCEF) IVPB 2g/100 mL premix     2 g 200 mL/hr over 30 Minutes Intravenous Every 6 hours 06/17/16 1554 06/18/16 0959   06/17/16 1554  valACYclovir (VALTREX) tablet 1,000 mg  Status:  Discontinued     1,000 mg Oral Daily PRN 06/17/16 1555 06/17/16 1740   06/17/16 1230  ceFAZolin (ANCEF) IVPB 2g/100 mL premix     2 g 200 mL/hr over 30 Minutes Intravenous To [facility] 06/16/16 0751 06/17/16 1319    .  She was given sequential compression devices and early ambulation for DVT prophylaxis.  She benefited maximally from the hospital stay and there were no complications.    Recent vital signs:  Vitals:   06/17/16 2015 06/18/16 0435  BP: 107/64 108/74  Pulse: 94 76  Resp: 18 18  Temp: 98.8 F (37.1 C) 98.4 F (36.9 C)    Recent laboratory studies:  Lab Results  Component Value Date   HGB  11.6 (L) 06/18/2016   HGB 13.4 06/13/2016   HGB 12.5 04/18/2014   Lab Results  Component Value Date   WBC 6.8 06/13/2016   PLT 227 06/13/2016   Lab Results  Component Value Date   INR 0.93 09/05/2013   Lab Results  Component Value Date   NA 139 06/18/2016   K 4.1 06/18/2016   CL 101 06/18/2016   CO2 26 06/18/2016   BUN 20 06/18/2016   CREATININE 1.03 (H) 06/18/2016   GLUCOSE 127 (H) 06/18/2016    Discharge Medications:   Allergies as of 06/18/2016      Reactions   Sulfonamide Derivatives Rash   SULFONAMIDES INCREASE SENSITIVITY   TO SUNLIGHT > MORE LIKELY TO SUNBURN SUNBURN-LIKE REDNESS      Medication List    TAKE these medications   acetaminophen 325 MG tablet Commonly known as:  TYLENOL Take 650 mg by mouth daily as needed for mild pain or headache.   amLODipine 2.5 MG tablet Commonly known as:  NORVASC TAKE 1 TABLET EVERY DAY   aspirin EC 81 MG tablet Take 81 mg by mouth daily. WILL STOP PRIOR TO PROCEDURE   benazepril-hydrochlorthiazide 20-25 MG tablet Commonly known as:  LOTENSIN HCT Take 1 tablet by mouth daily.   BIOFREEZE EX Apply 1 application topically 3 (three) times daily as needed (PAIN).   cetirizine 10 MG tablet Commonly known as:  ZYRTEC Take 10 mg by mouth  daily as needed for allergies.   clorazepate 3.75 MG tablet Commonly known as:  TRANXENE Take 1 tablet (3.75 mg total) by mouth 2 (two) times daily. What changed:  when to take this  reasons to take this   cyanocobalamin 500 MCG tablet Take 500 mcg by mouth daily.   DULoxetine 30 MG capsule Commonly known as:  CYMBALTA Take 3 capsules (90 mg total) by mouth daily.   Fish Oil 1000 MG Caps Take 1,000 mg by mouth daily. STOPPED PRIOR TO PROCEDURE   levothyroxine 112 MCG tablet Commonly known as:  SYNTHROID, LEVOTHROID TAKE 1 TABLET (112 MCG TOTAL) BY MOUTH DAILY.   meloxicam 15 MG tablet Commonly known as:  MOBIC Take 1 tablet (15 mg total) by mouth daily. What  changed:  additional instructions   methocarbamol 500 MG tablet Commonly known as:  ROBAXIN Take 1 tablet (500 mg total) by mouth 3 (three) times daily as needed.   omeprazole 40 MG capsule Commonly known as:  PRILOSEC TAKE 1 CAPSULE (40 MG TOTAL) BY MOUTH DAILY. What changed:  See the new instructions.   oxyCODONE-acetaminophen 5-325 MG tablet Commonly known as:  ROXICET Take 1-2 tablets by mouth every 4 (four) hours as needed for severe pain.   RESTASIS MULTIDOSE 0.05 % ophthalmic emulsion Generic drug:  cycloSPORINE INSTILL 1 DROP INTO EACH EYE TWICE DAILY AS NEEDED FOR IRRITATION   rosuvastatin 20 MG tablet Commonly known as:  CRESTOR Take 1 tablet (20 mg total) by mouth daily. What changed:  when to take this   valACYclovir 1000 MG tablet Commonly known as:  VALTREX Take 1 tablet (1,000 mg total) by mouth daily. What changed:  when to take this  reasons to take this   Vitamin D 2000 units tablet Take 2,000 Units by mouth daily.       Diagnostic Studies: Mr Shoulder Right Wo Contrast  Result Date: 05/26/2016 CLINICAL DATA:  Right shoulder pain extending through the right arm with weakness. EXAM: MRI OF THE RIGHT SHOULDER WITHOUT CONTRAST TECHNIQUE: Multiplanar, multisequence MR imaging of the shoulder was performed. No intravenous contrast was administered. COMPARISON:  None. FINDINGS: Rotator cuff: Moderate tendinosis of the supraspinatus tendon with a small partial-thickness bursal surface tear of the anterior fibers. Infraspinatus tendon is intact. Teres minor tendon is intact. Subscapularis tendon is intact. Muscles: No atrophy or fatty replacement of nor abnormal signal within, the muscles of the rotator cuff. Biceps long head:  Intact. Acromioclavicular Joint: Mild arthropathy of the acromioclavicular joint. Type I acromion. Moderate amount of subacromial/subdeltoid bursal fluid. Glenohumeral Joint: Large joint effusion. Extensive full-thickness cartilage loss  throughout the glenohumeral joint with subchondral reactive marrow edema in the humeral head and to lesser extent glenoid. Multiple glenohumeral joint loose bodies and synovitis. Labrum:  Intact. Bones:  No acute fracture or dislocation. Other: No fluid collection or hematoma. IMPRESSION: 1. Extensive full-thickness cartilage loss throughout the glenohumeral joint with subchondral reactive marrow edema in the humeral head and to a lesser extent glenoid. Large joint effusion with multiple loose bodies and synovitis. The degree of cartilage loss is disproportionate to marginal osteophyte formation. There may be an element of an inflammatory or crystalline arthropathy with superimposed severe osteoarthritis. 2. Moderate tendinosis of the supraspinatus tendon with a small partial-thickness bursal surface tear of the anterior fibers. Electronically Signed   By: Kathreen Devoid   On: 05/26/2016 08:44   Dg Shoulder Right Port  Result Date: 06/17/2016 CLINICAL DATA:  Status post right shoulder replacement EXAM: PORTABLE RIGHT  SHOULDER COMPARISON:  05/25/2016 FINDINGS: Right shoulder prosthesis is now seen. No acute bony or soft tissue abnormality is noted. IMPRESSION: Status post right shoulder replacement Electronically Signed   By: Inez Catalina M.D.   On: 06/17/2016 16:35    Disposition: 06-Home-Health Care Svc  Discharge Instructions    Call MD / Call 911    Complete by:  As directed    If you experience chest pain or shortness of breath, CALL 911 and be transported to the hospital emergency room.  If you develope a fever above 101 F, pus (white drainage) or increased drainage or redness at the wound, or calf pain, call your surgeon's office.   Constipation Prevention    Complete by:  As directed    Drink plenty of fluids.  Prune juice may be helpful.  You may use a stool softener, such as Colace (over the counter) 100 mg twice a day.  Use MiraLax (over the counter) for constipation as needed.   Diet - low  sodium heart healthy    Complete by:  As directed    Discharge instructions    Complete by:  As directed    See Dr. Veverly Fells' discharge instructions   Driving restrictions    Complete by:  As directed    No driving for 6 weeks   Increase activity slowly as tolerated    Complete by:  As directed    Lifting restrictions    Complete by:  As directed    No lifting for 8 weeks      Follow-up Information    NORRIS,STEVEN R, MD. Call in 2 weeks.   Specialty:  Orthopedic Surgery Why:  [phone] Contact information: [address] [place] [address] [phone]            Signed: Elie Goody 06/18/2016, 8:36 AM

## 2016-06-20 ENCOUNTER — Encounter (HOSPITAL_COMMUNITY): Payer: Self-pay | Admitting: Orthopedic Surgery

## 2016-06-20 NOTE — Op Note (Signed)
NAME:  Faraj,                             ACCOUNT NO.:  MEDICAL RECORD NO.:  [id number]  LOCATION:                                 FACILITY:  PHYSICIAN:  Doran Heater. Veverly Fells, M.D.      DATE OF BIRTH:  DATE OF PROCEDURE:  06/17/2016 DATE OF DISCHARGE:                              OPERATIVE REPORT   PREOPERATIVE DIAGNOSIS:  Right shoulder end-stage osteoarthritis.  POSTOPERATIVE DIAGNOSIS:  Right shoulder end-stage osteoarthritis.  PROCEDURE PERFORMED:  Right anatomic total shoulder arthroplasty using [place].  ATTENDING SURGEON:  Doran Heater. Veverly Fells, MD.  ASSISTANT:  Ventura Bruns, PA-C, who scrubbed during the entire procedure and necessary for satisfactory completion of surgery.  ANESTHESIA:  General anesthesia was used plus interscalene block.  ESTIMATED BLOOD LOSS:  150 mL.  FLUID REPLACEMENT:  1500 mL crystalloid.  INSTRUMENT COUNTS:  Correct.  COMPLICATIONS:  There were no complications.  ANTIBIOTICS:  Perioperative antibiotics were given.  INDICATIONS:  The patient is a 61 year old female with worsening shoulder pain on right side secondary to end-stage arthritis.  The patient has bone-on-bone as well as some AVN with head collapse.  The patient has multiple loose bodies and severe synovitis.  Due to progression clinically for her condition as well as declining function and increasing pain, we discussed options and recommended shoulder arthroplasty using an anatomic system.  The patient understands and agrees and wants to move forward.  Informed consent obtained.  DESCRIPTION OF PROCEDURE:  After adequate level of anesthesia was achieved, the patient was positioned in the modified beach-chair position.  Right shoulder correctly identified, sterilely prepped and draped in the usual manner.  Time-out was called.  We initiated surgery using standard deltopectoral incision, starting at the coracoid process and extending down to the anterior humerus.   Dissection down through subcutaneous tissues using Bovie electrocautery.  We identified cephalic vein, took it laterally with the deltoid, pectoralis taken medially. Conjoint tendon was identified and retracted medially.  We tenodesed the biceps in situ using 0 Vicryl figure-of-eight suture through the pectoralis tendon.  We then went ahead and released the subscapularis subperiosteally off the lesser tuberosity tagging for repair with #2 Hi- fi in a modified Mason-Allen suture technique.  We released the inferior capsule progressively externally rotating the humerus.  We then placed a Crego elevator over the humeral head right at the rotator cuff insertion and then another Crego elevator inferiorly to protect the axillary contents.  We then went ahead and placed the elbow at the patient's side, externally rotated 20-30 degrees and then made our anterior to posterior head cut using a neck resection guide.  Once we had made that head cut, we removed the head.  There was clearly a collapsed area of the humeral head as well as advanced cartilage wear.  We removed excess osteophytes with a rongeur.  They really were not that many.  This appeared to be a little bit more of an AVN with progression type situation.  Once we had the entire humerus prepared, we extended the shoulder, then entered the proximal humerus with 6 mm reamer reaming  up to size 10.  We then did our metaphyseal preparation with the punch using broach system with a size 8 and then a size 10.  We then introduced our size 10 Global Unite stem and impacted that in 25 degrees or so of retroversion.  We removed the trial stem.  We subluxed the humerus posteriorly.  We ensured that the rotator cuff was intact and looked to be in good condition, which it was.  We removed numerous loose bodies from the subscapularis recess and also posteriorly.  We did a 360- degree glenoid labrum removal and capsular release and capsular removal. We  freed up the subscapularis to make sure it was able to be easily would have good excursion.  We then went ahead and had 360-degree exposure of the glenoid face, removed the remaining cartilage with the Cobb elevator.  We then placed a central guide pin and then reamed for the 40 glenoid.  This was a DePuy anchor PEG glenoid system, so we did our central reaming with a low-profile reamer and then did our hand reamer on top of that.  Next, we went ahead and drilled our central peg hole.  We then drilled our 3 peripheral holes off with the appropriate guide, referencing off the inferior scapular neck.  Once we had those holes drilled, we trialed with the 40 trial APG trial, which seated nicely and was well supported.  We then removed the trials, irrigated the shoulder and then placed epi soaked Gel-Foam in the 3 peripheral holes to dry and prepare for cement.  We then used DePuy high-viscosity cement injected in the 3 peripheral holes and impacted the 40 APG glenoid into position.  We allowed the cement to harden.  We then went ahead and placed drill holes in the lesser tuberosity on her humeral side.  We placed #2 FiberWire suture in that and then used impaction grafting technique with available bone graft from the head and press-fit stem again Global Unite 10 body, 10 metaphysis and impacted that in 25 degrees of retroversion.  Once that was fully seated, we trialed with a 44, 18 eccentric head, which gave excellent coverage and we selected the real head and impacted that in position.  We reduced the shoulder.  We could translate inferiorly and posteriorly 50%.  We then repaired. After thorough irrigation, repaired the subscapularis anatomically back to the lesser tuberosity.  We had tendon to bone and tendon to tendon repair as well as rotator interval repair.  We ranged the shoulder, everything moved together as a unit.  No impingement.  We thoroughly irrigated, then closed deltopectoral  interval with 0 Vicryl suture, followed by 2-0 Vicryl for subcutaneous closure, and 4-0 Monocryl for skin.     Doran Heater. Veverly Fells, M.D.     SRN/MEDQ  D:  06/17/2016  T:  06/17/2016  Job:  [id number]

## 2016-06-30 DIAGNOSIS — M19011 Primary osteoarthritis, right shoulder: Secondary | ICD-10-CM | POA: Diagnosis not present

## 2016-07-05 ENCOUNTER — Ambulatory Visit: Payer: BLUE CROSS/BLUE SHIELD | Attending: Orthopedic Surgery | Admitting: Physical Therapy

## 2016-07-05 ENCOUNTER — Encounter: Payer: Self-pay | Admitting: Physical Therapy

## 2016-07-05 DIAGNOSIS — M25611 Stiffness of right shoulder, not elsewhere classified: Secondary | ICD-10-CM | POA: Insufficient documentation

## 2016-07-05 DIAGNOSIS — M25511 Pain in right shoulder: Secondary | ICD-10-CM

## 2016-07-05 NOTE — Therapy (Signed)
[facility] [place], [place], [address] Phone: [phone]   Fax:  [phone]  Physical Therapy Treatment  Patient Details  Name: [place] Robbins MRN: [id number] Date of Birth: 04-29-1955 Referring Provider: Veverly Fells  Encounter Date: 07/05/2016      PT End of Session - 07/05/16 0949    Visit Number 1   Number of Visits 16   Date for PT Re-Evaluation 08/30/16   PT Start Time 0955   PT Stop Time 1038   PT Time Calculation (min) 43 min   Activity Tolerance Patient tolerated treatment well   Behavior During Therapy [facility] for tasks assessed/performed      Past Medical History:  Diagnosis Date  . Anxiety   . Arthritis   . Depression   . GERD (gastroesophageal reflux disease)   . History of hiatal hernia   . Hypercholesteremia   . Hypertension   . Hypothyroidism     Past Surgical History:  Procedure Laterality Date  . ABDOMINAL HYSTERECTOMY    . BREAST LUMPECTOMY     benign  . CARDIAC CATHETERIZATION     9 YRS AGO  . COLONOSCOPY N/A 05/30/2012   Procedure: COLONOSCOPY;  Surgeon: Rogene Houston, MD;  [facility];  Service: Endoscopy;  Laterality: N/A;  830-moved to 1125 Ann to notify pt  . DILATION AND CURETTAGE OF UTERUS     X2  . JOINT REPLACEMENT Right    right hip  . Right Carpal Tunnel Release    . 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   Procedure: LEFT TOTAL KNEE ARTHROPLASTY;  Surgeon: Gearlean Alf, MD;  [facility]: [facility];  Service: Orthopedics;  Laterality: Left;  . TOTAL SHOULDER ARTHROPLASTY Right 06/17/2016  . TOTAL SHOULDER ARTHROPLASTY Right 06/17/2016   Procedure: RIGHT TOTAL SHOULDER ARTHROPLASTY;  Surgeon: Netta Cedars, MD;  Location: [facility];  Service: Orthopedics;  Laterality: Right;  . TUBAL LIGATION      There were no vitals filed for this visit.      Subjective Assessment - 07/05/16 0955    Subjective Patient had a R TSA on 06/17/16. Patient reports more of a healing pain vs the  pain she had before. She wears her sling with walking or leaving the house, but at home does not need to wear per MD.   Pertinent History HTN, OP, R THA, L TKA   Patient Stated Goals Use right UE without pain.   Currently in Pain? Yes   Pain Score 3    Pain Location Shoulder   Pain Orientation Right   Pain Descriptors / Indicators Burning;Sore;Aching   Pain Type Surgical pain   Pain Onset 1 to 4 weeks ago   Pain Frequency Intermittent   Aggravating Factors  certain movements   Pain Relieving Factors rest   Effect of Pain on Daily Activities unable to do housework, ADLS            [facility] PT Assessment - 07/05/16 0001      Assessment   Medical Diagnosis s/p R TSA   Referring Provider Mechele Claude PA-C; Netta Cedars MD   Onset Date/Surgical Date 06/17/16   Next MD Visit 07/27/16     Precautions   Precautions Shoulder   Type of Shoulder Precautions AAROM 0-90 deg, ER 0 deg, IR to abdomen;   Shoulder Interventions Shoulder sling/immobilizer  when leaves the house   Precaution Comments no lifting     Restrictions   Weight Bearing Restrictions No  Balance Screen   Has the patient fallen in the past 6 months No   Has the patient had a decrease in activity level because of a fear of falling?  Yes   Is the patient reluctant to leave their home because of a fear of falling?  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 #)     ROM / Strength   AROM / PROM / Strength PROM     AROM   Right/Left Shoulder Right   Right Shoulder Flexion 90 Degrees   Right Shoulder Internal Rotation --  to abdomen   Right Shoulder External Rotation 13 Degrees     PROM   PROM Assessment  Site Shoulder   Right/Left Shoulder Right   Right Shoulder Flexion 90 Degrees   Right Shoulder ABduction 73 Degrees   Right Shoulder Internal Rotation 38 Degrees  at 45 deg   Right Shoulder External Rotation 13 Degrees                     [facility] Adult PT Treatment/Exercise - 07/05/16 0001      Modalities   Modalities Electrical Stimulation;Vasopneumatic     Electrical Stimulation   Electrical Stimulation Location R shoulder   Electrical Stimulation Action premod   Electrical Stimulation Parameters 1-10 Hz x 15 min   Electrical Stimulation Goals Edema;Pain     Vasopneumatic   Number Minutes Vasopneumatic  15 minutes   Vasopnuematic Location  Shoulder   Vasopneumatic Pressure Medium   Vasopneumatic Temperature  34                     PT Long Term Goals - 07/05/16 1045      PT LONG TERM GOAL #1   Title Independent with a HEP.   Time 8   Period Weeks   Status New     PT LONG TERM GOAL #2   Title Active right shoulder ER to 65 degrees+ to allow for easily donning/doffing of apparel.   Time 8   Period Weeks   Status New     PT LONG TERM GOAL #3   Title Increase right shoulder strength to a solid 5/5 to increase stability for performance of functional activities   Time 8   Period Weeks   Status New     PT LONG TERM GOAL #4   Title Perform ADL's with right shoulder pain not > 2/10.   Time 8   Period Weeks   Status New     PT LONG TERM GOAL #5   Title Increase R shoulder flex to 160 deg to perform OH ADLS and allow RTW.   Time 8   Period Weeks   Status New               Plan - 07/05/16 1027    Clinical Impression Statement Patient presents s/p R TSA on 06/17/16. She has limited ROM and pain limiting ADLS. Patient states she needs to RTW by 08/25/16 to avoid losing her job.    Rehab Potential Excellent   PT Frequency 2x / week   PT Duration 8 weeks   PT Treatment/Interventions  ADLs/Self Care Home Management;Cryotherapy;Electrical  Stimulation;Moist Heat;Therapeutic exercise;Therapeutic activities;Manual techniques;Passive range of motion;Vasopneumatic Device;Neuromuscular re-education;Patient/family education   PT Next Visit Plan TSA protocol; Add table slides and wall walks; periscapular active exercises; Isometrics; AAROM 0-90 deg, ER 0 deg; IR to Abdomen;    PT Home Exercise Plan pendulums, "rock the baby", AA hitchhiker, AA flexion, standing elbow ext/flex   Consulted and Agree with Plan of Care Patient      Patient will benefit from skilled therapeutic intervention in order to improve the following deficits and impairments:  Pain, Decreased strength, Decreased range of motion  Visit Diagnosis: Stiffness of right shoulder, not elsewhere classified - Plan: PT plan of care cert/re-cert  Acute pain of right shoulder - Plan: PT plan of care cert/re-cert     Problem List Patient Active Problem List   Diagnosis Date Noted  . Degenerative arthritis of right shoulder region 06/18/2016  . S/P shoulder replacement, right 06/17/2016  . Adhesive capsulitis of right shoulder 05/20/2016  . Bursitis, hip 07/27/2015  . Lichen sclerosus of female genitalia 12/30/2013  . OA (osteoarthritis) of knee 09/16/2013  . GERD (gastroesophageal reflux disease) 07/11/2013  . GAD (generalized anxiety disorder) 07/11/2013  . Depression with anxiety 07/11/2013  . Hyperlipidemia with target LDL less than 100 07/11/2013  . Hypothyroidism 07/11/2013  . Essential hypertension 12/19/2008    Madelyn Flavors PT 07/05/2016, 10:54 AM  [facility] [address] [place], [place], [address] Phone: [phone]   Fax:  [phone]  Name: [place] Robbins MRN: [id number] Date of Birth: Nov 27, 1955

## 2016-07-06 ENCOUNTER — Other Ambulatory Visit: Payer: Self-pay | Admitting: Family Medicine

## 2016-07-07 ENCOUNTER — Encounter: Payer: Self-pay | Admitting: Physical Therapy

## 2016-07-07 ENCOUNTER — Ambulatory Visit: Payer: BLUE CROSS/BLUE SHIELD | Admitting: Physical Therapy

## 2016-07-07 DIAGNOSIS — M25611 Stiffness of right shoulder, not elsewhere classified: Secondary | ICD-10-CM

## 2016-07-07 DIAGNOSIS — M25511 Pain in right shoulder: Secondary | ICD-10-CM

## 2016-07-07 NOTE — Therapy (Cosign Needed)
[facility] [place], [place], [address] Phone: [phone]   Fax:  [phone]  Physical Therapy Treatment  Patient Details  Name: [place] Robbins MRN: [id number] Date of Birth: [birth year] Referring Provider: Veverly Fells  Encounter Date: 07/07/2016      PT End of Session - 07/07/16 0923    Visit Number 2   Number of Visits 16   Date for PT Re-Evaluation 08/30/16   PT Start Time 0858   PT Stop Time 0948   PT Time Calculation (min) 50 min   Activity Tolerance Patient tolerated treatment well   Behavior During Therapy [facility] for tasks assessed/performed      Past Medical History:  Diagnosis Date  . Anxiety   . Arthritis   . Depression   . GERD (gastroesophageal reflux disease)   . History of hiatal hernia   . Hypercholesteremia   . Hypertension   . Hypothyroidism     Past Surgical History:  Procedure Laterality Date  . ABDOMINAL HYSTERECTOMY    . BREAST LUMPECTOMY     benign  . CARDIAC CATHETERIZATION     9 YRS AGO  . COLONOSCOPY N/A 05/30/2012   Procedure: COLONOSCOPY;  Surgeon: Rogene Houston, MD;  [facility];  Service: Endoscopy;  Laterality: N/A;  830-moved to 1125 Ann to notify pt  . DILATION AND CURETTAGE OF UTERUS     X2  . JOINT REPLACEMENT Right    right hip  . Right Carpal Tunnel Release    . 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   Procedure: LEFT TOTAL KNEE ARTHROPLASTY;  Surgeon: Gearlean Alf, MD;  [facility];  Service: Orthopedics;  Laterality: Left;  . TOTAL SHOULDER ARTHROPLASTY Right 06/17/2016  . TOTAL SHOULDER ARTHROPLASTY Right 06/17/2016   Procedure: RIGHT TOTAL SHOULDER ARTHROPLASTY;  Surgeon: Netta Cedars, MD;  Location: [place];  Service: Orthopedics;  Laterality: Right;  . TUBAL LIGATION      There were no vitals filed for this visit.      Subjective Assessment - 07/07/16 0903    Subjective Patient feeling well overall just some soreness, doing exercises per MD   Pertinent History HTN, OP, R THA, L TKA   Patient Stated Goals Use right UE without pain.   Currently in Pain? Yes   Pain Score 3    Pain Location Shoulder   Pain Orientation Right   Pain Descriptors / Indicators Burning;Sore   Pain Type Surgical pain   Pain Onset 1 to 4 weeks ago   Pain Frequency Intermittent   Aggravating Factors  certain movements   Pain Relieving Factors at rest and meds                         [facility] Adult PT Treatment/Exercise - 07/07/16 0001      Shoulder Exercises: Standing   Other Standing Exercises Active assistive table slide standing with shoulder to 70-80 degrees range only 2x10   Other Standing Exercises wall walk 70-80 degree range (#10-12#) AAROM using left UE 2x10     Shoulder Exercises: Isometric Strengthening   Flexion 5X5"   Extension 5X5"   External Rotation 5X5"   Internal Rotation 5X5"     Electrical Stimulation   Electrical Stimulation Location R shoulder   Electrical Stimulation Action premod   Electrical Stimulation Parameters 1-10hz  x84min   Electrical Stimulation Goals Edema;Pain     Vasopneumatic   Number Minutes Vasopneumatic  15 minutes  Vasopnuematic Location  Shoulder   Vasopneumatic Pressure Medium     Manual Therapy   Manual Therapy Passive ROM   Passive ROM AAROM for right shoulder flexion to 90 degrees, ER to 0 and IR to abdomen per MD orders                PT Education - 07/07/16 0927    Education provided Yes   Education Details HEP isometrics   Person(s) Educated Patient   Methods Explanation;Demonstration;Handout   Comprehension Verbalized understanding;Returned demonstration             PT Long Term Goals - 07/07/16 0925      PT LONG TERM GOAL #1   Title Independent with a HEP.   Time 8   Period Weeks   Status On-going     PT LONG TERM GOAL #2   Title Active right shoulder ER to 65 degrees+ to allow for easily donning/doffing of apparel.   Time 8   Period Weeks    Status On-going     PT LONG TERM GOAL #3   Title Increase right shoulder strength to a solid 5/5 to increase stability for performance of functional activities   Time 8   Period Weeks   Status On-going     PT LONG TERM GOAL #4   Title Perform ADL's with right shoulder pain not > 2/10.   Time 8   Period Weeks   Status On-going     PT LONG TERM GOAL #5   Title Increase R shoulder flex to 160 deg to perform OH ADLS and allow RTW.   Time 8   Period Weeks   Status On-going               Plan - 07/07/16 0935    Clinical Impression Statement Patient tolerated treatment well today. Patient has been doing pendulum, rock the baby, and AAROM for flexion to 90 degrees supine per MD. Today progressed with MD. orders for table sledes, isometrics and wall walk AAROM within protocol limitations. HEP given for isometrics. Patient current goals ongoing due to protocol limitations.    Rehab Potential Excellent   PT Frequency 2x / week   PT Duration 8 weeks   PT Treatment/Interventions ADLs/Self Care Home Management;Cryotherapy;Electrical Stimulation;Moist Heat;Therapeutic exercise;Therapeutic activities;Manual techniques;Passive range of motion;Vasopneumatic Device;Neuromuscular re-education;Patient/family education   PT Next Visit Plan cont per TSA protocol/MD order for periscapular active exercises AAROM 0-90 deg, ER 0 deg; IR to Abdomen (Dr. Veverly Fells 07/27/16)    Consulted and Agree with Plan of Care Patient      Patient will benefit from skilled therapeutic intervention in order to improve the following deficits and impairments:  Pain, Decreased strength, Decreased range of motion  Visit Diagnosis: Acute pain of right shoulder  Stiffness of right shoulder, not elsewhere classified     Problem List Patient Active Problem List   Diagnosis Date Noted  . Degenerative arthritis of right shoulder region 06/18/2016  . S/P shoulder replacement, right 06/17/2016  . Adhesive capsulitis of  right shoulder 05/20/2016  . Bursitis, hip 07/27/2015  . Lichen sclerosus of female genitalia 12/30/2013  . OA (osteoarthritis) of knee 09/16/2013  . GERD (gastroesophageal reflux disease) 07/11/2013  . GAD (generalized anxiety disorder) 07/11/2013  . Depression with anxiety 07/11/2013  . Hyperlipidemia with target LDL less than 100 07/11/2013  . Hypothyroidism 07/11/2013  . Essential hypertension 12/19/2008    Towanna Avery P, PTA 07/07/2016, 9:50 AM  [facility] [address]  [place], [place], [address] Phone: [phone]   Fax:  [phone]  Name: [place] Robbins MRN: [id number] Date of Birth: [birth year]

## 2016-07-07 NOTE — Patient Instructions (Signed)
Strengthening: Isometric Flexion   Using wall for resistance, press right fist into ball using light pressure. Hold __5__ seconds. Repeat __10__ times per set. Do __2__ sets per session. Do _2___ sessions per day.   Strengthening: Isometric Extension   Using wall for resistance, press back of right arm into ball using light pressure. Hold _5___ seconds. Repeat __10__ times per set. Do __2__ sets per session. Do __2__ sessions per day.   Strengthening: Isometric External Rotation   Using wall to provide resistance, and keeping right arm at side, press back of hand into ball using light pressure. Hold __5__ seconds. Repeat __10__ times per set. Do _2___ sets per session. Do __2__ sessions per day.   Strengthening: Isometric Internal Rotation   Using door frame for resistance, press palm of right hand into ball using light pressure. Keep elbow in at side. Hold _5___ seconds. Repeat __10__ times per set. Do _2___ sets per session. Do __2__ sessions per day.    

## 2016-07-11 ENCOUNTER — Ambulatory Visit: Payer: BLUE CROSS/BLUE SHIELD | Admitting: Physical Therapy

## 2016-07-11 DIAGNOSIS — M25511 Pain in right shoulder: Secondary | ICD-10-CM | POA: Diagnosis not present

## 2016-07-11 DIAGNOSIS — M25611 Stiffness of right shoulder, not elsewhere classified: Secondary | ICD-10-CM | POA: Diagnosis not present

## 2016-07-11 NOTE — Therapy (Signed)
[facility] [place], [place], [address] Phone: [phone]   Fax:  [phone]  Physical Therapy Treatment  Patient Details  Name: Leslie Robbins MRN: [id number] Date of Birth: 08-Feb-1956 Referring Provider: Veverly Fells  Encounter Date: 07/11/2016      PT End of Session - 07/11/16 0952    Visit Number 3   Number of Visits 16   Date for PT Re-Evaluation 08/30/16   PT Start Time 0951   PT Stop Time 1045   PT Time Calculation (min) 54 min   Activity Tolerance Patient tolerated treatment well   Behavior During Therapy [facility] for tasks assessed/performed      Past Medical History:  Diagnosis Date  . Anxiety   . Arthritis   . Depression   . GERD (gastroesophageal reflux disease)   . History of hiatal hernia   . Hypercholesteremia   . Hypertension   . Hypothyroidism     Past Surgical History:  Procedure Laterality Date  . ABDOMINAL HYSTERECTOMY    . BREAST LUMPECTOMY     benign  . CARDIAC CATHETERIZATION     9 YRS AGO  . COLONOSCOPY N/A 05/30/2012   Procedure: COLONOSCOPY;  Surgeon: Rogene Houston, MD;  [facility];  Service: Endoscopy;  Laterality: N/A;  830-moved to 1125 Ann to notify pt  . DILATION AND CURETTAGE OF UTERUS     X2  . JOINT REPLACEMENT Right    right hip  . Right Carpal Tunnel Release    . 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   Procedure: LEFT TOTAL KNEE ARTHROPLASTY;  Surgeon: Gearlean Alf, MD;  Location: [facility];  Service: Orthopedics;  Laterality: Left;  . TOTAL SHOULDER ARTHROPLASTY Right 06/17/2016  . TOTAL SHOULDER ARTHROPLASTY Right 06/17/2016   Procedure: RIGHT TOTAL SHOULDER ARTHROPLASTY;  Surgeon: Netta Cedars, MD;  Location: [place];  Service: Orthopedics;  Laterality: Right;  . TUBAL LIGATION      There were no vitals filed for this visit.      Subjective Assessment - 07/11/16 0952    Subjective Patient states she hurt her bicep area wringing out a towel this weekend.  Today it's just a little sore to the touch, but it worried her.    Pertinent History HTN, OP, R THA, L TKA   Patient Stated Goals Use right UE without pain.   Currently in Pain? Yes  only with movement   Pain Score 2    Pain Location Shoulder   Pain Orientation Right   Pain Descriptors / Indicators Sore                         [facility] Adult PT Treatment/Exercise - 07/11/16 0001      Exercises   Exercises Shoulder     Shoulder Exercises: Supine   Flexion AAROM;Both;10 reps  2x5   Flexion Limitations supine cane chest press     Shoulder Exercises: Seated   Elevation Strengthening;Both;10 reps   Retraction Strengthening;Both;10 reps   Other Seated Exercises shoulder rolls x 10 posteriorly     Shoulder Exercises: Standing   Other Standing Exercises table slide seated with shoulder to about 80 degrees range only 2x10   Other Standing Exercises wall walk 70-80 degree range (#10-12#) AAROM using left UE 2x10     Shoulder Exercises: Isometric Strengthening   Flexion 5X10"   Extension 5X10"   External Rotation 5X10"   Internal Rotation 5X10"     Modalities  Modalities [employer] fast streamer Action premod   Electrical Stimulation Parameters 1-10 Hz x 15 min'   Printmaker Goals Edema;Pain     Vasopneumatic   Number Minutes Vasopneumatic  15 minutes   Vasopnuematic Location  Shoulder   Vasopneumatic Pressure Medium   Vasopneumatic Temperature  34     Manual Therapy   Manual Therapy Passive ROM   Passive ROM AAROM for right shoulder flexion to 90 degrees, ER to 0 and IR to abdomen per MD orders                     PT Long Term Goals - 07/07/16 0925      PT LONG TERM GOAL #1   Title Independent with a HEP.   Time 8   Period Weeks   Status On-going     PT LONG TERM GOAL #2   Title Active right shoulder ER to 65  degrees+ to allow for easily donning/doffing of apparel.   Time 8   Period Weeks   Status On-going     PT LONG TERM GOAL #3   Title Increase right shoulder strength to a solid 5/5 to increase stability for performance of functional activities   Time 8   Period Weeks   Status On-going     PT LONG TERM GOAL #4   Title Perform ADL's with right shoulder pain not > 2/10.   Time 8   Period Weeks   Status On-going     PT LONG TERM GOAL #5   Title Increase R shoulder flex to 160 deg to perform OH ADLS and allow RTW.   Time 8   Period Weeks   Status On-going               Plan - 07/11/16 1032    Clinical Impression Statement Patient presented today with concerns that she hurt her shoulder wringing a wash rag this weekend. She tolerated treatment well with no indication of injury to shoulder.    PT Treatment/Interventions ADLs/Self Care Home Management;Cryotherapy;Electrical Stimulation;Moist Heat;Therapeutic exercise;Therapeutic activities;Manual techniques;Passive range of motion;Vasopneumatic Device;Neuromuscular re-education;Patient/family education   PT Next Visit Plan cont per TSA protocol/MD order for periscapular active exercises AAROM 0-90 deg, ER 0 deg; IR to Abdomen (Dr. Veverly Fells 07/27/16)    PT Home Exercise Plan pendulums, "rock the baby", AA hitchhiker, AA flexion, standing elbow ext/flex      Patient will benefit from skilled therapeutic intervention in order to improve the following deficits and impairments:  Pain, Decreased strength, Decreased range of motion  Visit Diagnosis: Stiffness of right shoulder, not elsewhere classified  Acute pain of right shoulder     Problem List Patient Active Problem List   Diagnosis Date Noted  . Degenerative arthritis of right shoulder region 06/18/2016  . S/P shoulder replacement, right 06/17/2016  . Adhesive capsulitis of right shoulder 05/20/2016  . Bursitis, hip 07/27/2015  . Lichen sclerosus of female genitalia  12/30/2013  . OA (osteoarthritis) of knee 09/16/2013  . GERD (gastroesophageal reflux disease) 07/11/2013  . GAD (generalized anxiety disorder) 07/11/2013  . Depression with anxiety 07/11/2013  . Hyperlipidemia with target LDL less than 100 07/11/2013  . Hypothyroidism 07/11/2013  . Essential hypertension 12/19/2008    Madelyn Flavors PT 07/11/2016, 2:35 PM  [facility] [address] [place], [place], [address] Phone: [phone]   Fax:  [phone]  Name: Leslie Robbins MRN: [id number] Date of Birth: 01-15-1956

## 2016-07-14 ENCOUNTER — Ambulatory Visit: Payer: BLUE CROSS/BLUE SHIELD | Admitting: Physical Therapy

## 2016-07-14 DIAGNOSIS — M25611 Stiffness of right shoulder, not elsewhere classified: Secondary | ICD-10-CM

## 2016-07-14 DIAGNOSIS — M25511 Pain in right shoulder: Secondary | ICD-10-CM | POA: Diagnosis not present

## 2016-07-14 NOTE — Therapy (Signed)
[facility] [place], [place], [address] Phone: [phone]   Fax:  [phone]  Physical Therapy Treatment  Patient Details  Name: Leslie Robbins MRN: [id number] Date of Birth: [birth year] Referring Provider: Esmond Plants, MD  Encounter Date: 07/14/2016      PT End of Session - 07/14/16 1404    Visit Number 4   Number of Visits 16   Date for PT Re-Evaluation 08/30/16   PT Start Time 0945   PT Stop Time 1030   PT Time Calculation (min) 45 min   Activity Tolerance Patient tolerated treatment well   Behavior During Therapy [facility] for tasks assessed/performed      Past Medical History:  Diagnosis Date  . Anxiety   . Arthritis   . Depression   . GERD (gastroesophageal reflux disease)   . History of hiatal hernia   . Hypercholesteremia   . Hypertension   . Hypothyroidism     Past Surgical History:  Procedure Laterality Date  . ABDOMINAL HYSTERECTOMY    . BREAST LUMPECTOMY     benign  . CARDIAC CATHETERIZATION     9 YRS AGO  . COLONOSCOPY N/A 05/30/2012   Procedure: COLONOSCOPY;  Surgeon: Rogene Houston, MD;  [facility];  Service: Endoscopy;  Laterality: N/A;  830-moved to 1125 Ann to notify pt  . DILATION AND CURETTAGE OF UTERUS     X2  . JOINT REPLACEMENT Right    right hip  . Right Carpal Tunnel Release    . 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   Procedure: LEFT TOTAL KNEE ARTHROPLASTY;  Surgeon: Gearlean Alf, MD;  [facility];  Service: Orthopedics;  Laterality: Left;  . TOTAL SHOULDER ARTHROPLASTY Right 06/17/2016  . TOTAL SHOULDER ARTHROPLASTY Right 06/17/2016   Procedure: RIGHT TOTAL SHOULDER ARTHROPLASTY;  Surgeon: Netta Cedars, MD;  Location: [place];  Service: Orthopedics;  Laterality: Right;  . TUBAL LIGATION      There were no vitals filed for this visit.      Subjective Assessment - 07/14/16 1402    Subjective Patient arriving to therapy today reporting pain of 2-3/10. Pt  reporting her pain is worse in the morning.    Pertinent History HTN, OP, R THA, L TKA   Patient Stated Goals Use right UE without pain.   Currently in Pain? Yes   Pain Score 3    Pain Location Shoulder   Pain Orientation Right   Pain Descriptors / Indicators Sore   Pain Type Surgical pain   Pain Onset 1 to 4 weeks ago   Pain Frequency Intermittent   Aggravating Factors  certain movements   Pain Relieving Factors resting, pain meds            [facility] PT Assessment - 07/14/16 0001      Assessment   Medical Diagnosis s/p R TSA   Referring Provider Esmond Plants, MD   Onset Date/Surgical Date 06/17/16   Next MD Visit 07/27/16     Precautions   Shoulder Interventions Shoulder sling/immobilizer   Precaution Comments No lifting, pt reporting MD may release her from sling at next visit                     [facility] Adult PT Treatment/Exercise - 07/14/16 0001      Exercises   Exercises Shoulder     Shoulder Exercises: Supine   Flexion Limitations supine cane chest press     Shoulder Exercises: Seated  Elevation Strengthening;Both;10 reps   Retraction Strengthening;Both;10 reps   Other Seated Exercises posterior shoulder rolls x 15      Shoulder Exercises: Standing   Other Standing Exercises wall walk 70-80 degree range (#10-12#) AAROM using left UE 2x10     Shoulder Exercises: Isometric Strengthening   Flexion 5X10"   Extension 5X10"   External Rotation 5X10"   Internal Rotation 5X10"     Modalities   Modalities Electrical Stimulation;Vasopneumatic     Electrical Stimulation   Electrical Stimulation Location Right shoulder   Electrical Stimulation Action pre mod   Electrical Stimulation Parameters 1-10Hz  x 15 minutes, intensity to pt's tolerance   Electrical Stimulation Goals Edema;Pain     Vasopneumatic   Number Minutes Vasopneumatic  15 minutes   Vasopnuematic Location  Shoulder   Vasopneumatic Pressure Medium   Vasopneumatic Temperature  36      Manual Therapy   Manual Therapy Passive ROM   Passive ROM AAROM for right shoulder flexion to 90 degrees, ER to 0 and IR to abdomen per MD orders                PT Education - 07/14/16 1403    Education provided Yes   Education Details HEP review   Person(s) Educated Patient   Methods Explanation;Demonstration   Comprehension Verbalized understanding;Returned demonstration             PT Long Term Goals - 07/14/16 1406      PT LONG TERM GOAL #1   Title Independent with a HEP.   Time 8   Period Weeks   Status On-going     PT LONG TERM GOAL #2   Title Active right shoulder ER to 65 degrees+ to allow for easily donning/doffing of apparel.   Time 8   Period Weeks   Status On-going     PT LONG TERM GOAL #3   Title Increase right shoulder strength to a solid 5/5 to increase stability for performance of functional activities   Time 8   Period Weeks   Status On-going     PT LONG TERM GOAL #4   Title Perform ADL's with right shoulder pain not > 2/10.   Time 8   Period Weeks   Status On-going     PT LONG TERM GOAL #5   Title Increase R shoulder flex to 160 deg to perform OH ADLS and allow RTW.   Time 8   Period Weeks   Status On-going               Plan - 07/14/16 1404    Clinical Impression Statement Patient tolerated treatment well today reporting no pain at end of session. Pt still with tenderness noted over incision site. Continue with skilled PT to progress pt toward goals set.    Rehab Potential Excellent   PT Frequency 2x / week   PT Duration 8 weeks   PT Treatment/Interventions ADLs/Self Care Home Management;Cryotherapy;Electrical Stimulation;Moist Heat;Therapeutic exercise;Therapeutic activities;Manual techniques;Passive range of motion;Vasopneumatic Device;Neuromuscular re-education;Patient/family education   PT Next Visit Plan cont per TSA protocol/MD order for periscapular active exercises AAROM 0-90 deg, ER 0 deg; IR to Abdomen (Dr. Veverly Fells  07/27/16)    PT Home Exercise Plan pendulums, "rock the baby", AA hitchhiker, AA flexion, standing elbow ext/flex   Consulted and Agree with Plan of Care Patient      Patient will benefit from skilled therapeutic intervention in order to improve the following deficits and impairments:  Pain, Decreased strength, Decreased  range of motion  Visit Diagnosis: Stiffness of right shoulder, not elsewhere classified  Acute pain of right shoulder     Problem List Patient Active Problem List   Diagnosis Date Noted  . Degenerative arthritis of right shoulder region 06/18/2016  . S/P shoulder replacement, right 06/17/2016  . Adhesive capsulitis of right shoulder 05/20/2016  . Bursitis, hip 07/27/2015  . Lichen sclerosus of female genitalia 12/30/2013  . OA (osteoarthritis) of knee 09/16/2013  . GERD (gastroesophageal reflux disease) 07/11/2013  . GAD (generalized anxiety disorder) 07/11/2013  . Depression with anxiety 07/11/2013  . Hyperlipidemia with target LDL less than 100 07/11/2013  . Hypothyroidism 07/11/2013  . Essential hypertension 12/19/2008    Oretha Caprice, MPT 07/14/2016, 2:09 PM  [facility] [address] [place], [place], [address] Phone: [phone]   Fax:  [phone]  Name: Leslie Robbins MRN: [id number] Date of Birth: Oct 12, 1955

## 2016-07-18 ENCOUNTER — Ambulatory Visit: Payer: BLUE CROSS/BLUE SHIELD | Admitting: Physical Therapy

## 2016-07-18 ENCOUNTER — Encounter: Payer: Self-pay | Admitting: Physical Therapy

## 2016-07-18 DIAGNOSIS — M25611 Stiffness of right shoulder, not elsewhere classified: Secondary | ICD-10-CM

## 2016-07-18 DIAGNOSIS — M25511 Pain in right shoulder: Secondary | ICD-10-CM | POA: Diagnosis not present

## 2016-07-18 NOTE — Therapy (Signed)
[facility] [place], [place], [address] Phone: [phone]   Fax:  [phone]  Physical Therapy Treatment  Patient Details  Name: Leslie Robbins MRN: [id number] Date of Birth: September 03, 1955 Referring Provider: Marland Kitchen  Encounter Date: 07/18/2016      PT End of Session - 07/18/16 0902    Visit Number 5   Number of Visits 16   Date for PT Re-Evaluation 08/30/16   PT Start Time 0902   PT Stop Time 0947   PT Time Calculation (min) 45 min   Activity Tolerance Patient tolerated treatment well   Behavior During Therapy [facility] for tasks assessed/performed      Past Medical History:  Diagnosis Date  . Anxiety   . Arthritis   . Depression   . GERD (gastroesophageal reflux disease)   . History of hiatal hernia   . Hypercholesteremia   . Hypertension   . Hypothyroidism     Past Surgical History:  Procedure Laterality Date  . ABDOMINAL HYSTERECTOMY    . BREAST LUMPECTOMY     benign  . CARDIAC CATHETERIZATION     9 YRS AGO  . COLONOSCOPY N/A 05/30/2012   Procedure: COLONOSCOPY;  Surgeon: Rogene Houston, MD;  [facility];  Service: Endoscopy;  Laterality: N/A;  830-moved to 1125 Ann to notify pt  . DILATION AND CURETTAGE OF UTERUS     X2  . JOINT REPLACEMENT Right    right hip  . Right Carpal Tunnel Release    . 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   Procedure: LEFT TOTAL KNEE ARTHROPLASTY;  Surgeon: Gearlean Alf, MD;  [facility];  Service: Orthopedics;  Laterality: Left;  . TOTAL SHOULDER ARTHROPLASTY Right 06/17/2016  . TOTAL SHOULDER ARTHROPLASTY Right 06/17/2016   Procedure: RIGHT TOTAL SHOULDER ARTHROPLASTY;  Surgeon: Netta Cedars, MD;  Location: [place];  Service: Orthopedics;  Laterality: Right;  . TUBAL LIGATION      There were no vitals filed for this visit.      Subjective Assessment - 07/18/16 0900    Subjective Reports soreness from wringing out rag. Reports stinging and pin type sensation in  anterior shoulder around incision. States that her anterior shoulder feels very tight.   Pertinent History HTN, OP, R THA, L TKA   Patient Stated Goals Use right UE without pain.   Currently in Pain? Yes   Pain Score 2    Pain Location Shoulder   Pain Orientation Right   Pain Descriptors / Indicators Sore;Tingling;Tightness   Pain Type Surgical pain   Pain Onset 1 to 4 weeks ago            [facility] PT Assessment - 07/18/16 0001      Assessment   Medical Diagnosis s/p R TSA   Referring Provider `   Onset Date/Surgical Date 06/17/16   Next MD Visit 07/27/16     Precautions   Precautions Shoulder   Type of Shoulder Precautions AAROM 0-90 deg, ER 0 deg, IR to abdomen;   Shoulder Interventions Shoulder sling/immobilizer   Precaution Comments No lifting, pt reporting MD may release her from sling at next visit     Restrictions   Weight Bearing Restrictions No                     [facility] Adult PT Treatment/Exercise - 07/18/16 0001      Shoulder Exercises: Seated   Other Seated Exercises R shoulder table slides into flexion x20  reps  pain in anterior shoulder     Shoulder Exercises: Standing   Retraction Strengthening;Both;20 reps   Other Standing Exercises B posterior shoulder rolls x20 reps   Other Standing Exercises Wall walk with wall ladder to 15 x3 min     Shoulder Exercises: Isometric Strengthening   Flexion 5X10"   Extension 5X10"   External Rotation 5X10"   Internal Rotation 5X10"     Modalities   Modalities Electrical Stimulation;Vasopneumatic     Electrical Stimulation   Electrical Stimulation Location R shoulder   Electrical Stimulation Action Pre-Mod   Electrical Stimulation Parameters 80-150 hz x15 min   Electrical Stimulation Goals Edema;Pain     Vasopneumatic   Number Minutes Vasopneumatic  15 minutes   Vasopnuematic Location  Shoulder   Vasopneumatic Pressure Medium   Vasopneumatic Temperature  34     Manual Therapy   Manual Therapy  Passive ROM   Passive ROM PROM of R shoulder into flexion to 90 deg, ER/IR to 0 deg with holds at end range                     PT Long Term Goals - 07/14/16 1406      PT LONG TERM GOAL #1   Title Independent with a HEP.   Time 8   Period Weeks   Status On-going     PT LONG TERM GOAL #2   Title Active right shoulder ER to 65 degrees+ to allow for easily donning/doffing of apparel.   Time 8   Period Weeks   Status On-going     PT LONG TERM GOAL #3   Title Increase right shoulder strength to a solid 5/5 to increase stability for performance of functional activities   Time 8   Period Weeks   Status On-going     PT LONG TERM GOAL #4   Title Perform ADL's with right shoulder pain not > 2/10.   Time 8   Period Weeks   Status On-going     PT LONG TERM GOAL #5   Title Increase R shoulder flex to 160 deg to perform OH ADLS and allow RTW.   Time 8   Period Weeks   Status On-going               Plan - 07/18/16 0936    Clinical Impression Statement Patient tolerated today's treatment fairly well although she continues to have stinging and soreness. Tightness noted in anterior shoulder with flexion exercises. Scapular retraction exercises completed better in standing per patient report. No palpable tightness noted in lateral Pectoralis musculature today but tightness noted in R posterior shoulder and UT. Normal firm end feels noted with PROM of R shoulder in all directions assessed today. Normal modalities response noted following removal of the modalities.   Rehab Potential Excellent   PT Frequency 2x / week   PT Duration 8 weeks   PT Treatment/Interventions ADLs/Self Care Home Management;Cryotherapy;Electrical Stimulation;Moist Heat;Therapeutic exercise;Therapeutic activities;Manual techniques;Passive range of motion;Vasopneumatic Device;Neuromuscular re-education;Patient/family education   PT Next Visit Plan cont per TSA protocol/MD order for periscapular active  exercises AAROM 0-90 deg, ER 0 deg; IR to Abdomen (Dr. Veverly Fells 07/27/16)    PT Home Exercise Plan pendulums, "rock the baby", AA hitchhiker, AA flexion, standing elbow ext/flex   Consulted and Agree with Plan of Care Patient      Patient will benefit from skilled therapeutic intervention in order to improve the following deficits and impairments:  Pain, Decreased strength, Decreased  range of motion  Visit Diagnosis: Stiffness of right shoulder, not elsewhere classified  Acute pain of right shoulder     Problem List Patient Active Problem List   Diagnosis Date Noted  . Degenerative arthritis of right shoulder region 06/18/2016  . S/P shoulder replacement, right 06/17/2016  . Adhesive capsulitis of right shoulder 05/20/2016  . Bursitis, hip 07/27/2015  . Lichen sclerosus of female genitalia 12/30/2013  . OA (osteoarthritis) of knee 09/16/2013  . GERD (gastroesophageal reflux disease) 07/11/2013  . GAD (generalized anxiety disorder) 07/11/2013  . Depression with anxiety 07/11/2013  . Hyperlipidemia with target LDL less than 100 07/11/2013  . Hypothyroidism 07/11/2013  . Essential hypertension 12/19/2008    Wynelle Fanny, PTA 07/18/2016, 9:54 AM  [facility] [address] [place], [place], [address] Phone: [phone]   Fax:  [phone]  Name: Leslie Robbins MRN: [id number] Date of Birth: 22-Oct-1955

## 2016-07-22 ENCOUNTER — Ambulatory Visit: Payer: BLUE CROSS/BLUE SHIELD | Admitting: Physical Therapy

## 2016-07-22 ENCOUNTER — Encounter: Payer: Self-pay | Admitting: Physical Therapy

## 2016-07-22 DIAGNOSIS — M25511 Pain in right shoulder: Secondary | ICD-10-CM | POA: Diagnosis not present

## 2016-07-22 DIAGNOSIS — M25611 Stiffness of right shoulder, not elsewhere classified: Secondary | ICD-10-CM

## 2016-07-22 NOTE — Therapy (Signed)
[facility] [place], [place], [address] Phone: [phone]   Fax:  [phone]  Physical Therapy Treatment  Patient Details  Name: Leslie Robbins MRN: [id number] Date of Birth: 1955-06-30 Referring Provider: Marland Kitchen  Encounter Date: 07/22/2016      PT End of Session - 07/22/16 0956    Visit Number 6   Number of Visits 16   Date for PT Re-Evaluation 08/30/16   PT Start Time 0947   PT Stop Time 1038   PT Time Calculation (min) 51 min   Activity Tolerance Patient tolerated treatment well   Behavior During Therapy [facility] for tasks assessed/performed      Past Medical History:  Diagnosis Date  . Anxiety   . Arthritis   . Depression   . GERD (gastroesophageal reflux disease)   . History of hiatal hernia   . Hypercholesteremia   . Hypertension   . Hypothyroidism     Past Surgical History:  Procedure Laterality Date  . ABDOMINAL HYSTERECTOMY    . BREAST LUMPECTOMY     benign  . CARDIAC CATHETERIZATION     9 YRS AGO  . COLONOSCOPY N/A 05/30/2012   Procedure: COLONOSCOPY;  Surgeon: Rogene Houston, MD;  [facility];  Service: Endoscopy;  Laterality: N/A;  830-moved to 1125 Ann to notify pt  . DILATION AND CURETTAGE OF UTERUS     X2  . JOINT REPLACEMENT Right    right hip  . Right Carpal Tunnel Release    . 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   Procedure: LEFT TOTAL KNEE ARTHROPLASTY;  Surgeon: Gearlean Alf, MD;  [facility];  Service: Orthopedics;  Laterality: Left;  . TOTAL SHOULDER ARTHROPLASTY Right 06/17/2016  . TOTAL SHOULDER ARTHROPLASTY Right 06/17/2016   Procedure: RIGHT TOTAL SHOULDER ARTHROPLASTY;  Surgeon: Netta Cedars, MD;  Location: [place];  Service: Orthopedics;  Laterality: Right;  . TUBAL LIGATION      There were no vitals filed for this visit.      Subjective Assessment - 07/22/16 0945    Subjective Reports that her shoulder is very stiff this morning.   Pertinent History HTN,  OP, R THA, L TKA   Patient Stated Goals Use right UE without pain.   Currently in Pain? Yes   Pain Score 4    Pain Location Shoulder   Pain Orientation Right   Pain Descriptors / Indicators Tightness;Other (Comment)  Stiffness   Pain Type Surgical pain   Pain Onset 1 to 4 weeks ago            [facility] PT Assessment - 07/22/16 0001      Assessment   Medical Diagnosis s/p R TSA   Onset Date/Surgical Date 06/17/16   Next MD Visit 07/27/16     Precautions   Precautions Shoulder   Type of Shoulder Precautions AAROM 0-90 deg, ER 0 deg, IR to abdomen;   Shoulder Interventions Shoulder sling/immobilizer   Precaution Comments No lifting, pt reporting MD may release her from sling at next visit     Restrictions   Weight Bearing Restrictions No                     [facility] Adult PT Treatment/Exercise - 07/22/16 0001      Shoulder Exercises: Supine   Protraction AAROM;Both;20 reps   Flexion AAROM;Both;20 reps     Shoulder Exercises: Pulleys   Flexion Other (comment)  x6 min   Other Pulley Exercises  Seated UE ranger into flex x30 reps, circles x30 reps   Other Pulley Exercises RUE wall ladder x10 reps  to level 15     Modalities   Modalities Electrical Stimulation;Vasopneumatic     Electrical Stimulation   Electrical Stimulation Location R shoulder   Electrical Stimulation Action Pre-Mod   Electrical Stimulation Parameters 80-150 hz x15 min   Electrical Stimulation Goals Edema;Pain     Vasopneumatic   Number Minutes Vasopneumatic  15 minutes   Vasopnuematic Location  Shoulder   Vasopneumatic Pressure Medium   Vasopneumatic Temperature  48     Manual Therapy   Manual Therapy Passive ROM   Passive ROM PROM of R shoulder into flexion to 90 deg, ER/IR to 0 deg with holds at end range                     PT Long Term Goals - 07/14/16 1406      PT LONG TERM GOAL #1   Title Independent with a HEP.   Time 8   Period Weeks   Status On-going      PT LONG TERM GOAL #2   Title Active right shoulder ER to 65 degrees+ to allow for easily donning/doffing of apparel.   Time 8   Period Weeks   Status On-going     PT LONG TERM GOAL #3   Title Increase right shoulder strength to a solid 5/5 to increase stability for performance of functional activities   Time 8   Period Weeks   Status On-going     PT LONG TERM GOAL #4   Title Perform ADL's with right shoulder pain not > 2/10.   Time 8   Period Weeks   Status On-going     PT LONG TERM GOAL #5   Title Increase R shoulder flex to 160 deg to perform OH ADLS and allow RTW.   Time 8   Period Weeks   Status On-going               Plan - 07/22/16 1025    Clinical Impression Statement Patient tolerated today's treatment well although she was limited with assisted pulley into flexion to 90 deg. No difficulty with any supine AAROM exercises today. Patient continues to experience a tightness in anterior shoulder/lateral Pec only with movement. No abnormal tone palpated upon rest of R shoulder. Patient limited with ROM and exercises per protocol limitations at this time. Normal modalities response noted following removal of the modalities.   Rehab Potential Excellent   PT Frequency 2x / week   PT Duration 8 weeks   PT Treatment/Interventions ADLs/Self Care Home Management;Cryotherapy;Electrical Stimulation;Moist Heat;Therapeutic exercise;Therapeutic activities;Manual techniques;Passive range of motion;Vasopneumatic Device;Neuromuscular re-education;Patient/family education   PT Next Visit Plan cont per TSA protocol/MD order for periscapular active exercises AAROM 0-90 deg, ER 0 deg; IR to Abdomen (Dr. Veverly Fells 07/27/16)    PT Home Exercise Plan pendulums, "rock the baby", AA hitchhiker, AA flexion, standing elbow ext/flex   Consulted and Agree with Plan of Care Patient      Patient will benefit from skilled therapeutic intervention in order to improve the following deficits and  impairments:  Pain, Decreased strength, Decreased range of motion  Visit Diagnosis: Stiffness of right shoulder, not elsewhere classified  Acute pain of right shoulder     Problem List Patient Active Problem List   Diagnosis Date Noted  . Degenerative arthritis of right shoulder region 06/18/2016  . S/P shoulder replacement, right 06/17/2016  .  Adhesive capsulitis of right shoulder 05/20/2016  . Bursitis, hip 07/27/2015  . Lichen sclerosus of female genitalia 12/30/2013  . OA (osteoarthritis) of knee 09/16/2013  . GERD (gastroesophageal reflux disease) 07/11/2013  . GAD (generalized anxiety disorder) 07/11/2013  . Depression with anxiety 07/11/2013  . Hyperlipidemia with target LDL less than 100 07/11/2013  . Hypothyroidism 07/11/2013  . Essential hypertension 12/19/2008    Wynelle Fanny, PTA 07/22/2016, 10:46 AM  [facility] [address] [place], [place], [address] Phone: [phone]   Fax:  [phone]  Name: Leslie Robbins MRN: [id number] Date of Birth: [birth year]

## 2016-07-25 ENCOUNTER — Encounter: Payer: Self-pay | Admitting: Physical Therapy

## 2016-07-25 ENCOUNTER — Ambulatory Visit: Payer: BLUE CROSS/BLUE SHIELD | Admitting: Physical Therapy

## 2016-07-25 DIAGNOSIS — M25611 Stiffness of right shoulder, not elsewhere classified: Secondary | ICD-10-CM

## 2016-07-25 DIAGNOSIS — M25511 Pain in right shoulder: Secondary | ICD-10-CM

## 2016-07-25 NOTE — Therapy (Signed)
[facility] [place], [place], [address] Phone: [phone]   Fax:  [phone]  Physical Therapy Treatment  Patient Details  Name: Leslie Robbins MRN: [id number] Date of Birth: [birth year] Referring Provider: Marland Kitchen  Encounter Date: 07/25/2016      PT End of Session - 07/25/16 1021    Visit Number 7   Number of Visits 16   Date for PT Re-Evaluation 08/30/16   PT Start Time 0946   PT Stop Time 1035   PT Time Calculation (min) 49 min   Activity Tolerance Patient tolerated treatment well   Behavior During Therapy [facility] for tasks assessed/performed      Past Medical History:  Diagnosis Date  . Anxiety   . Arthritis   . Depression   . GERD (gastroesophageal reflux disease)   . History of hiatal hernia   . Hypercholesteremia   . Hypertension   . Hypothyroidism     Past Surgical History:  Procedure Laterality Date  . ABDOMINAL HYSTERECTOMY    . BREAST LUMPECTOMY     benign  . CARDIAC CATHETERIZATION     9 YRS AGO  . COLONOSCOPY N/A 05/30/2012   Procedure: COLONOSCOPY;  Surgeon: Rogene Houston, MD;  [facility];  Service: Endoscopy;  Laterality: N/A;  830-moved to 1125 Ann to notify pt  . DILATION AND CURETTAGE OF UTERUS     X2  . JOINT REPLACEMENT Right    right hip  . Right Carpal Tunnel Release    . 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   Procedure: LEFT TOTAL KNEE ARTHROPLASTY;  Surgeon: Gearlean Alf, MD;  [facility];  Service: Orthopedics;  Laterality: Left;  . TOTAL SHOULDER ARTHROPLASTY Right 06/17/2016  . TOTAL SHOULDER ARTHROPLASTY Right 06/17/2016   Procedure: RIGHT TOTAL SHOULDER ARTHROPLASTY;  Surgeon: Netta Cedars, MD;  Location: [place];  Service: Orthopedics;  Laterality: Right;  . TUBAL LIGATION      There were no vitals filed for this visit.      Subjective Assessment - 07/25/16 0948    Subjective Patient reported sleeping on right shouder and only some pain after movement   Pertinent History HTN, OP, R THA, L TKA   Patient Stated Goals Use right UE without pain.   Currently in Pain? Yes   Pain Score 4    Pain Location Shoulder   Pain Orientation Right   Pain Descriptors / Indicators Sore;Tightness   Pain Type Surgical pain   Pain Onset 1 to 4 weeks ago   Pain Frequency Intermittent   Aggravating Factors  certain movements   Pain Relieving Factors resting                         [facility] Adult PT Treatment/Exercise - 07/25/16 0001      Shoulder Exercises: Pulleys   Flexion 3 minutes;Other (comment)  seated AAROM to 90 degrees only   Other Pulley Exercises Seated UE ranger into flex x30 reps, circles x30 reps   Other Pulley Exercises RUE wall ladder 2x10 reps  to 90 degrees only     Electrical Stimulation   Electrical Stimulation Location R shoulder   Electrical Stimulation Action premod   Electrical Stimulation Parameters 80-150hz x43min   Electrical Stimulation Goals Edema;Pain     Vasopneumatic   Number Minutes Vasopneumatic  15 minutes   Vasopnuematic Location  Shoulder   Vasopneumatic Pressure Medium     Manual Therapy   Manual Therapy Passive  ROM   Passive ROM PROM of R shoulder into flexion to 90 deg, ER/IR to 0 deg with holds at end range                     PT Long Term Goals - 07/14/16 1406      PT LONG TERM GOAL #1   Title Independent with a HEP.   Time 8   Period Weeks   Status On-going     PT LONG TERM GOAL #2   Title Active right shoulder ER to 65 degrees+ to allow for easily donning/doffing of apparel.   Time 8   Period Weeks   Status On-going     PT LONG TERM GOAL #3   Title Increase right shoulder strength to a solid 5/5 to increase stability for performance of functional activities   Time 8   Period Weeks   Status On-going     PT LONG TERM GOAL #4   Title Perform ADL's with right shoulder pain not > 2/10.   Time 8   Period Weeks   Status On-going     PT LONG TERM GOAL #5   Title  Increase R shoulder flex to 160 deg to perform OH ADLS and allow RTW.   Time 8   Period Weeks   Status On-going               Plan - 07/25/16 1023    Clinical Impression Statement Patient tolerated treatment well today. Patient performed active assited exercises within MD ROM restrictions to allow healing. Patient has no pain at rest and minimal soreness with acctivity. Patient reported doing isometrics daily. Patient goals ongoing due to protocil limitations.    Rehab Potential Excellent   Clinical Impairments Affecting Rehab Potential 06/17/13 surgery - current 5 weeks 07/22/16   PT Frequency 2x / week   PT Duration 8 weeks   PT Treatment/Interventions ADLs/Self Care Home Management;Cryotherapy;Electrical Stimulation;Moist Heat;Therapeutic exercise;Therapeutic activities;Manual techniques;Passive range of motion;Vasopneumatic Device;Neuromuscular re-education;Patient/family education   PT Next Visit Plan cont per TSA protocol/MD order for periscapular active exercises AAROM 0-90 deg, ER 0 deg; IR to Abdomen (Dr. Veverly Fells 07/27/16)    Consulted and Agree with Plan of Care Patient      Patient will benefit from skilled therapeutic intervention in order to improve the following deficits and impairments:  Pain, Decreased strength, Decreased range of motion  Visit Diagnosis: Stiffness of right shoulder, not elsewhere classified  Acute pain of right shoulder     Problem List Patient Active Problem List   Diagnosis Date Noted  . Degenerative arthritis of right shoulder region 06/18/2016  . S/P shoulder replacement, right 06/17/2016  . Adhesive capsulitis of right shoulder 05/20/2016  . Bursitis, hip 07/27/2015  . Lichen sclerosus of female genitalia 12/30/2013  . OA (osteoarthritis) of knee 09/16/2013  . GERD (gastroesophageal reflux disease) 07/11/2013  . GAD (generalized anxiety disorder) 07/11/2013  . Depression with anxiety 07/11/2013  . Hyperlipidemia with target LDL less  than 100 07/11/2013  . Hypothyroidism 07/11/2013  . Essential hypertension 12/19/2008    Ladean Raya, PTA 07/25/16 10:56 AM Mali Applegate MPT [facility] [place], [place], [address] Phone: [phone]   Fax:  [phone]  Name: Leslie Robbins MRN: [id number] Date of Birth: 08-May-1955

## 2016-07-27 DIAGNOSIS — M19011 Primary osteoarthritis, right shoulder: Secondary | ICD-10-CM | POA: Diagnosis not present

## 2016-07-28 ENCOUNTER — Other Ambulatory Visit: Payer: Self-pay | Admitting: Family Medicine

## 2016-07-29 ENCOUNTER — Encounter: Payer: Self-pay | Admitting: Physical Therapy

## 2016-07-29 ENCOUNTER — Other Ambulatory Visit: Payer: Self-pay | Admitting: Family Medicine

## 2016-07-29 ENCOUNTER — Ambulatory Visit: Payer: BLUE CROSS/BLUE SHIELD | Admitting: Physical Therapy

## 2016-07-29 ENCOUNTER — Other Ambulatory Visit: Payer: BLUE CROSS/BLUE SHIELD

## 2016-07-29 DIAGNOSIS — E785 Hyperlipidemia, unspecified: Secondary | ICD-10-CM

## 2016-07-29 DIAGNOSIS — I10 Essential (primary) hypertension: Secondary | ICD-10-CM | POA: Diagnosis not present

## 2016-07-29 DIAGNOSIS — E039 Hypothyroidism, unspecified: Secondary | ICD-10-CM

## 2016-07-29 DIAGNOSIS — M25611 Stiffness of right shoulder, not elsewhere classified: Secondary | ICD-10-CM | POA: Diagnosis not present

## 2016-07-29 DIAGNOSIS — M25511 Pain in right shoulder: Secondary | ICD-10-CM

## 2016-07-29 DIAGNOSIS — K219 Gastro-esophageal reflux disease without esophagitis: Secondary | ICD-10-CM

## 2016-07-29 NOTE — Therapy (Signed)
[facility] [place], [place], [address] Phone: [phone]   Fax:  [phone]  Physical Therapy Treatment  Patient Details  Name: Leslie Robbins MRN: [id number] Date of Birth: [birth year] Referring Provider: Marland Kitchen  Encounter Date: 07/29/2016      PT End of Session - 07/29/16 1256    Visit Number 8   Number of Visits 16   Date for PT Re-Evaluation 08/30/16   PT Start Time 0945   PT Stop Time 9735   PT Time Calculation (min) 50 min   Activity Tolerance Patient tolerated treatment well   Behavior During Therapy [facility] for tasks assessed/performed      Past Medical History:  Diagnosis Date  . Anxiety   . Arthritis   . Depression   . GERD (gastroesophageal reflux disease)   . History of hiatal hernia   . Hypercholesteremia   . Hypertension   . Hypothyroidism     Past Surgical History:  Procedure Laterality Date  . ABDOMINAL HYSTERECTOMY    . BREAST LUMPECTOMY     benign  . CARDIAC CATHETERIZATION     9 YRS AGO  . COLONOSCOPY N/A 05/30/2012   Procedure: COLONOSCOPY;  Surgeon: Rogene Houston, MD;  [facility];  Service: Endoscopy;  Laterality: N/A;  830-moved to 1125 Ann to notify pt  . DILATION AND CURETTAGE OF UTERUS     X2  . JOINT REPLACEMENT Right    right hip  . Right Carpal Tunnel Release    . 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   Procedure: LEFT TOTAL KNEE ARTHROPLASTY;  Surgeon: Gearlean Alf, MD;  [facility];  Service: Orthopedics;  Laterality: Left;  . TOTAL SHOULDER ARTHROPLASTY Right 06/17/2016  . TOTAL SHOULDER ARTHROPLASTY Right 06/17/2016   Procedure: RIGHT TOTAL SHOULDER ARTHROPLASTY;  Surgeon: Netta Cedars, MD;  Location: [place];  Service: Orthopedics;  Laterality: Right;  . TUBAL LIGATION      There were no vitals filed for this visit.      Subjective Assessment - 07/29/16 1254    Subjective Patient arriving to therapy reporting R shoulder pain over incicion site.    Pertinent History HTN, OP, R THA, L TKA   Patient Stated Goals Use right UE without pain.   Currently in Pain? Yes   Pain Score 5    Pain Location Shoulder   Pain Orientation Right   Pain Descriptors / Indicators Aching;Sore   Pain Type Surgical pain   Pain Onset 1 to 4 weeks ago   Pain Frequency Intermittent   Aggravating Factors  certain movements   Pain Relieving Factors resting   Effect of Pain on Daily Activities unable to do household chores and difficulty with ADL's            [facility] PT Assessment - 07/29/16 0001      Precautions   Type of Shoulder Precautions Received Order from Dr. Veverly Fells to perform AAROM and AROM and begin strengthening, frequency 1x/week   Shoulder Interventions --  sling only for comfort as needed per MD                     [facility] Adult PT Treatment/Exercise - 07/29/16 0001      Modalities   Modalities Cryotherapy;Vasopneumatic     Electrical Stimulation   Electrical Stimulation Location R shoulder   Electrical Stimulation Action premod   Electrical Stimulation Parameters 80-150 Hz, x 15 minutes, intensity to pt's tolerance   Electrical  Stimulation Goals Edema;Pain     Vasopneumatic   Number Minutes Vasopneumatic  15 minutes   Vasopnuematic Location  Shoulder   Vasopneumatic Pressure Medium   Vasopneumatic Temperature  34                PT Education - 07/29/16 1256    Education provided Yes   Education Details cross friction self massage over incision site   Person(s) Educated Patient   Methods Explanation   Comprehension Verbalized understanding             PT Long Term Goals - 07/29/16 1300      PT LONG TERM GOAL #1   Title Independent with a HEP.   Time 8   Period Weeks   Status On-going     PT LONG TERM GOAL #2   Title Active right shoulder ER to 65 degrees+ to allow for easily donning/doffing of apparel.   Time 8   Period Weeks   Status On-going     PT LONG TERM GOAL #3   Title Increase  right shoulder strength to a solid 5/5 to increase stability for performance of functional activities   Time 8   Period Weeks   Status On-going     PT LONG TERM GOAL #4   Title Perform ADL's with right shoulder pain not > 2/10.   Time 8   Period Weeks     PT LONG TERM GOAL #5   Title Increase R shoulder flex to 160 deg to perform OH ADLS and allow RTW.   Time 8   Period Weeks   Status On-going               Plan - 07/29/16 1257    Clinical Impression Statement Patient tolerated treatment well. Pt arriving to therapy with MD order for AAROM and AROM and strengthening exercises 1x/week. Pt performing shoulder exercises AAROM with cane as well as bicep strengthening. Continue with skilled PT 1x/week for 8 additional weeks, as pt tolerates to progess toward her PLOF.     Rehab Potential Excellent   Clinical Impairments Affecting Rehab Potential 06/17/13 surgery - current 5 weeks 07/22/16   PT Frequency 2x / week   PT Duration 8 weeks   PT Treatment/Interventions ADLs/Self Care Home Management;Cryotherapy;Electrical Stimulation;Moist Heat;Therapeutic exercise;Therapeutic activities;Manual techniques;Passive range of motion;Vasopneumatic Device;Neuromuscular re-education;Patient/family education   PT Next Visit Plan cont per TSA protocol, recert to extend date to cover 8 additional weeks of therapy at [place] pendulums, "rock the baby", AA hitchhiker, AA flexion, standing elbow ext/flex   Consulted and Agree with Plan of Care Patient      Patient will benefit from skilled therapeutic intervention in order to improve the following deficits and impairments:  Pain, Decreased strength, Decreased range of motion  Visit Diagnosis: Stiffness of right shoulder, not elsewhere classified  Acute pain of right shoulder     Problem List Patient Active Problem List   Diagnosis Date Noted  . Degenerative arthritis of right shoulder region 06/18/2016  . S/P  shoulder replacement, right 06/17/2016  . Adhesive capsulitis of right shoulder 05/20/2016  . Bursitis, hip 07/27/2015  . Lichen sclerosus of female genitalia 12/30/2013  . OA (osteoarthritis) of knee 09/16/2013  . GERD (gastroesophageal reflux disease) 07/11/2013  . GAD (generalized anxiety disorder) 07/11/2013  . Depression with anxiety 07/11/2013  . Hyperlipidemia with target LDL less than 100 07/11/2013  . Hypothyroidism 07/11/2013  . Essential hypertension 12/19/2008  Oretha Caprice, MPT 07/29/2016, 1:06 PM  [facility] [place], [place], [address] Phone: [phone]   Fax:  [phone]  Name: AZZURE GARABEDIAN MRN: [id number] Date of Birth: 1955/12/04

## 2016-07-30 LAB — CBC WITH DIFFERENTIAL/PLATELET
Basophils Absolute: 0 10*3/uL (ref 0.0–0.2)
Basos: 1 %
EOS (ABSOLUTE): 0.6 10*3/uL — ABNORMAL HIGH (ref 0.0–0.4)
EOS: 9 %
HEMATOCRIT: 35.5 % (ref 34.0–46.6)
HEMOGLOBIN: 11.4 g/dL (ref 11.1–15.9)
IMMATURE GRANULOCYTES: 0 %
Immature Grans (Abs): 0 10*3/uL (ref 0.0–0.1)
Lymphocytes Absolute: 1.3 10*3/uL (ref 0.7–3.1)
Lymphs: 22 %
MCH: 27.1 pg (ref 26.6–33.0)
MCHC: 32.1 g/dL (ref 31.5–35.7)
MCV: 85 fL (ref 79–97)
MONOCYTES: 5 %
MONOS ABS: 0.3 10*3/uL (ref 0.1–0.9)
Neutrophils Absolute: 3.9 10*3/uL (ref 1.4–7.0)
Neutrophils: 63 %
Platelets: 234 10*3/uL (ref 150–379)
RBC: 4.2 x10E6/uL (ref 3.77–5.28)
RDW: 15.1 % (ref 12.3–15.4)
WBC: 6.2 10*3/uL (ref 3.4–10.8)

## 2016-07-30 LAB — CMP14+EGFR
A/G RATIO: 1.8 (ref 1.2–2.2)
ALK PHOS: 159 IU/L — AB (ref 39–117)
ALT: 21 IU/L (ref 0–32)
AST: 25 IU/L (ref 0–40)
Albumin: 4.2 g/dL (ref 3.6–4.8)
BUN / CREAT RATIO: 20 (ref 12–28)
BUN: 21 mg/dL (ref 8–27)
Bilirubin Total: 0.2 mg/dL (ref 0.0–1.2)
CALCIUM: 10 mg/dL (ref 8.7–10.3)
CO2: 25 mmol/L (ref 18–29)
Chloride: 106 mmol/L (ref 96–106)
Creatinine, Ser: 1.03 mg/dL — ABNORMAL HIGH (ref 0.57–1.00)
GFR calc non Af Amer: 58 mL/min/{1.73_m2} — ABNORMAL LOW (ref >59)
GFR, EST AFRICAN AMERICAN: 67 mL/min/{1.73_m2} (ref >59)
Globulin, Total: 2.4 g/dL (ref 1.5–4.5)
Glucose: 92 mg/dL (ref 65–99)
POTASSIUM: 4.2 mmol/L (ref 3.5–5.2)
Sodium: 144 mmol/L (ref 134–144)
Total Protein: 6.6 g/dL (ref 6.0–8.5)

## 2016-07-30 LAB — LIPID PANEL
CHOLESTEROL TOTAL: 119 mg/dL (ref 100–199)
Chol/HDL Ratio: 2.4 ratio (ref 0.0–4.4)
HDL: 49 mg/dL (ref >39)
LDL Calculated: 51 mg/dL (ref 0–99)
Triglycerides: 95 mg/dL (ref 0–149)
VLDL CHOLESTEROL CAL: 19 mg/dL (ref 5–40)

## 2016-07-30 LAB — TSH+FREE T4
FREE T4: 1.24 ng/dL (ref 0.82–1.77)
TSH: 0.357 u[IU]/mL — AB (ref 0.450–4.500)

## 2016-08-03 ENCOUNTER — Ambulatory Visit (INDEPENDENT_AMBULATORY_CARE_PROVIDER_SITE_OTHER): Payer: BLUE CROSS/BLUE SHIELD | Admitting: Family Medicine

## 2016-08-03 ENCOUNTER — Encounter: Payer: Self-pay | Admitting: Physical Therapy

## 2016-08-03 ENCOUNTER — Ambulatory Visit: Payer: BLUE CROSS/BLUE SHIELD | Admitting: Physical Therapy

## 2016-08-03 ENCOUNTER — Encounter: Payer: Self-pay | Admitting: Family Medicine

## 2016-08-03 VITALS — BP 110/72 | HR 89 | Temp 98.0°F | Ht 64.0 in | Wt 201.0 lb

## 2016-08-03 DIAGNOSIS — M1712 Unilateral primary osteoarthritis, left knee: Secondary | ICD-10-CM

## 2016-08-03 DIAGNOSIS — M25611 Stiffness of right shoulder, not elsewhere classified: Secondary | ICD-10-CM

## 2016-08-03 DIAGNOSIS — M25511 Pain in right shoulder: Secondary | ICD-10-CM | POA: Diagnosis not present

## 2016-08-03 DIAGNOSIS — M7501 Adhesive capsulitis of right shoulder: Secondary | ICD-10-CM

## 2016-08-03 DIAGNOSIS — I10 Essential (primary) hypertension: Secondary | ICD-10-CM | POA: Diagnosis not present

## 2016-08-03 DIAGNOSIS — E785 Hyperlipidemia, unspecified: Secondary | ICD-10-CM | POA: Diagnosis not present

## 2016-08-03 DIAGNOSIS — E039 Hypothyroidism, unspecified: Secondary | ICD-10-CM | POA: Diagnosis not present

## 2016-08-03 DIAGNOSIS — K219 Gastro-esophageal reflux disease without esophagitis: Secondary | ICD-10-CM | POA: Diagnosis not present

## 2016-08-03 MED ORDER — CLORAZEPATE DIPOTASSIUM 3.75 MG PO TABS: 3.7500 mg | ORAL_TABLET | Freq: Two times a day (BID) | ORAL | 5 refills | Status: DC

## 2016-08-03 NOTE — Therapy (Signed)
[facility] [place], [place], [address] Phone: [phone]   Fax:  [phone]  Physical Therapy Treatment  Patient Details  Name: Leslie Robbins MRN: [id number] Date of Birth: 02/08/1956 Referring Provider: Marland Kitchen  Encounter Date: 08/03/2016      PT End of Session - 08/03/16 1121    Visit Number 9   Number of Visits 16   Date for PT Re-Evaluation 08/30/16   PT Start Time 1031   PT Stop Time 1130   PT Time Calculation (min) 59 min   Activity Tolerance Patient tolerated treatment well   Behavior During Therapy [facility] for tasks assessed/performed      Past Medical History:  Diagnosis Date  . Anxiety   . Arthritis   . Depression   . GERD (gastroesophageal reflux disease)   . History of hiatal hernia   . Hypercholesteremia   . Hypertension   . Hypothyroidism     Past Surgical History:  Procedure Laterality Date  . ABDOMINAL HYSTERECTOMY    . BREAST LUMPECTOMY     benign  . CARDIAC CATHETERIZATION     9 YRS AGO  . COLONOSCOPY N/A 05/30/2012   Procedure: COLONOSCOPY;  Surgeon: Rogene Houston, MD;  Location: [facility];  Service: Endoscopy;  Laterality: N/A;  830-moved to 1125 Ann to notify pt  . DILATION AND CURETTAGE OF UTERUS     X2  . JOINT REPLACEMENT Right    right hip  . Right Carpal Tunnel Release    . 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   Procedure: LEFT TOTAL KNEE ARTHROPLASTY;  Surgeon: Gearlean Alf, MD;  Location: [facility];  Service: Orthopedics;  Laterality: Left;  . TOTAL SHOULDER ARTHROPLASTY Right 06/17/2016  . TOTAL SHOULDER ARTHROPLASTY Right 06/17/2016   Procedure: RIGHT TOTAL SHOULDER ARTHROPLASTY;  Surgeon: Netta Cedars, MD;  Location: [facility];  Service: Orthopedics;  Laterality: Right;  . TUBAL LIGATION      There were no vitals filed for this visit.      Subjective Assessment - 08/03/16 1040    Subjective Patient reported a tired feeling not a lot of pain   Pertinent History HTN, OP, R  THA, L TKA   Patient Stated Goals Use right UE without pain.   Currently in Pain? Yes   Pain Score 2    Pain Location Shoulder   Pain Orientation Right   Pain Descriptors / Indicators Aching;Sore   Pain Type Surgical pain   Pain Frequency Intermittent   Aggravating Factors  certain movements   Pain Relieving Factors rest            [facility] PT Assessment - 08/03/16 0001      AROM   Right/Left Shoulder Right   Right Shoulder Flexion 55 Degrees     PROM   PROM Assessment Site Shoulder   Right/Left Shoulder Right   Right Shoulder Flexion 146 Degrees   Right Shoulder External Rotation 65 Degrees                     [facility] Adult PT Treatment/Exercise - 08/03/16 0001      Shoulder Exercises: Supine   Protraction Strengthening;Right  x30 in longsitting   External Rotation Strengthening;Right;20 reps  form hip and out   Flexion Right;Strengthening  2x10 in longsitting     Shoulder Exercises: Seated   Other Seated Exercises seated cane x30 for flexion and chest press     Shoulder Exercises: Prone   Other  Prone Exercises kneeling rows and ext x30 each     Shoulder Exercises: Sidelying   External Rotation Strengthening;Right  x30   Flexion Strengthening;Right  x30     Shoulder Exercises: Pulleys   Flexion Other (comment)  15mn   Other Pulley Exercises standing UE ranger elevation and circles 2x10     Electrical Stimulation   Electrical Stimulation Location R shoulder   Electrical Stimulation Action premod   Electrical Stimulation Parameters 80-_0  x17m   Electrical Stimulation Goals Edema;Pain     Vasopneumatic   Number Minutes Vasopneumatic  15 minutes   Vasopnuematic Location  Shoulder   Vasopneumatic Pressure Medium     Manual Therapy   Manual Therapy Passive ROM   Passive ROM PROM of R shoulder into flexion/ER                     PT Long Term Goals - 08/03/16 1123      PT LONG TERM GOAL #1   Title Independent with a HEP.    Time 8   Period Weeks   Status On-going     PT LONG TERM GOAL #2   Title Active right shoulder ER to 65 degrees+ to allow for easily donning/doffing of apparel.   Time 8   Period Weeks   Status On-going  AROM 55 degrees 08/03/16     PT LONG TERM GOAL #3   Title Increase right shoulder strength to a solid 5/5 to increase stability for performance of functional activities   Time 8   Period Weeks   Status On-going     PT LONG TERM GOAL #4   Title Perform ADL's with right shoulder pain not > 2/10.   Time 8   Period Weeks   Status On-going     PT LONG TERM GOAL #5   Title Increase R shoulder flex to 160 deg to perform OH ADLS and allow RTW.   Time 8   Period Weeks   Status On-going               Plan - 08/03/16 1124    Clinical Impression Statement Patient progressing with all activities today and able to progress AAROM to AROM with no pain complaints. Patient has some compensation with antgravity yet able to progress with sidelying with good technique. Patient improved with ROM in right shoulder yet no current goals met due to limitations.    Rehab Potential Excellent   Clinical Impairments Affecting Rehab Potential 06/17/13 surgery - current 6 weeks /25/18   PT Frequency 2x / week   PT Duration 8 weeks   PT Treatment/Interventions ADLs/Self Care Home Management;Cryotherapy;Electrical Stimulation;Moist Heat;Therapeutic exercise;Therapeutic activities;Manual techniques;Passive range of motion;Vasopneumatic Device;Neuromuscular re-education;Patient/family education   PT Next Visit Plan cont per TSA protocol, recert to extend date to cover 8 additional weeks of therapy at 1x/week   Consulted and Agree with Plan of Care Patient      Patient will benefit from skilled therapeutic intervention in order to improve the following deficits and impairments:  Pain, Decreased strength, Decreased range of motion  Visit Diagnosis: Stiffness of right shoulder, not elsewhere  classified  Acute pain of right shoulder     Problem List Patient Active Problem List   Diagnosis Date Noted  . Degenerative arthritis of right shoulder region 06/18/2016  . S/P shoulder replacement, right 06/17/2016  . Adhesive capsulitis of right shoulder 05/20/2016  . Bursitis, hip 07/27/2015  . Lichen sclerosus of female genitalia 12/30/2013  . OA (  osteoarthritis) of knee 09/16/2013  . GERD (gastroesophageal reflux disease) 07/11/2013  . GAD (generalized anxiety disorder) 07/11/2013  . Depression with anxiety 07/11/2013  . Hyperlipidemia with target LDL less than 100 07/11/2013  . Hypothyroidism 07/11/2013  . Essential hypertension 12/19/2008    Phillips Climes, PTA 08/03/2016, 11:36 AM  [facility] [place], [place], [address] Phone: [phone]   Fax:  [phone]  Name: Leslie Robbins MRN: [id number] Date of Birth: 1955/11/24

## 2016-08-03 NOTE — Progress Notes (Signed)
Subjective:  Patient ID: Leslie Robbins,  female    DOB: [birth year]  Age: 61 y.o.    CC: Hypertension (pt here today to go over her test results from last Friday and also wants to discuss her joint pain and her medications she is taking for it.)   HPI ROSELY FERNANDEZ presents for  follow-up of hypertension. Patient has no history of headache chest pain or shortness of breath or recent cough. Patient also denies symptoms of TIA such as numbness weakness lateralizing. Patient checks  blood pressure at home. Recent readings have been good Patient denies side effects from medication. States taking it regularly.  Patient also  in for follow-up of elevated cholesterol. Doing well without complaints on current medication. Denies side effects of statin including myalgia and arthralgia and nausea. Also in today for liver function testing. Currently no chest pain, shortness of breath or other cardiovascular related symptoms noted.  Patient presents for follow-up on  thyroid. The patient has a history of hypothyroidism for many years. It has been stable recently. Pt. denies any change in  voice, loss of hair, heat or cold intolerance. Energy level has been adequate to good. Patient denies constipation and diarrhea. No myxedema. Medication is as noted below. Verified that pt is taking it daily on an empty stomach. Well tolerated.  Patient in for follow-up of GERD. Currently asymptomatic taking  PPI daily. There is no chest pain or heartburn. No hematemesis and no melena. No dysphagia or choking. Onset is remote. Progression is stable. Complicating factors, none.   History Farron has a past medical history of Anxiety; Arthritis; Depression; GERD (gastroesophageal reflux disease); History of hiatal hernia; Hypercholesteremia; Hypertension; and Hypothyroidism.   She has a past surgical history that includes Abdominal hysterectomy; Tubal ligation; Right Carpal Tunnel Release; Breast lumpectomy; Colonoscopy (N/A,  05/30/2012); Dilation and curettage of uterus; Cardiac catheterization; Total knee arthroplasty (Left, 09/16/2013); Joint replacement (Right); Total shoulder arthroplasty (Right, 06/17/2016); and Total shoulder arthroplasty (Right, 06/17/2016).   Her family history includes Dementia in her mother; Depression in her sister; Fibromyalgia in her sister; Hypertension in her mother; Lupus in her mother.She reports that she has never smoked. She has never used smokeless tobacco. She reports that she does not drink alcohol or use drugs.  Current Outpatient Prescriptions on File Prior to Visit  Medication Sig Dispense Refill  . amLODipine (NORVASC) 2.5 MG tablet TAKE 1 TABLET EVERY DAY 90 tablet 1  . aspirin EC 81 MG tablet Take 81 mg by mouth daily. WILL STOP PRIOR TO PROCEDURE    . benazepril-hydrochlorthiazide (LOTENSIN HCT) 20-25 MG tablet Take 1 tablet by mouth daily. 90 tablet 3  . cetirizine (ZYRTEC) 10 MG tablet Take 10 mg by mouth daily as needed for allergies.    . Cholecalciferol (VITAMIN D) 2000 units tablet Take 2,000 Units by mouth daily.    . cyanocobalamin 500 MCG tablet Take 500 mcg by mouth daily.    . DULoxetine (CYMBALTA) 30 MG capsule Take 3 capsules (90 mg total) by mouth daily. 270 capsule 0  . levothyroxine (SYNTHROID, LEVOTHROID) 112 MCG tablet TAKE 1 TABLET (112 MCG TOTAL) BY MOUTH DAILY. 30 tablet 10  . meloxicam (MOBIC) 15 MG tablet Take 1 tablet (15 mg total) by mouth daily. (Patient taking differently: Take 15 mg by mouth daily. Will stop prior to procedure) 90 tablet 3  . Menthol, Topical Analgesic, (BIOFREEZE EX) Apply 1 application topically 3 (three) times daily as needed (PAIN).    Marland Kitchen  methocarbamol (ROBAXIN) 500 MG tablet Take 1 tablet (500 mg total) by mouth 3 (three) times daily as needed. 60 tablet 1  . Omega-3 Fatty Acids (FISH OIL) 1000 MG CAPS Take 1,000 mg by mouth daily. STOPPED PRIOR TO PROCEDURE    . omeprazole (PRILOSEC) 40 MG capsule TAKE 1 CAPSULE (40 MG TOTAL)  BY MOUTH DAILY. (Patient taking differently: TAKE 1 CAPSULE (40 MG TOTAL) BY MOUTH IN EVENING) 90 capsule 0  . RESTASIS MULTIDOSE 0.05 % ophthalmic emulsion INSTILL 1 DROP INTO EACH EYE TWICE DAILY AS NEEDED FOR IRRITATION  3  . rosuvastatin (CRESTOR) 20 MG tablet TAKE 1 TABLET (20 MG TOTAL) BY MOUTH DAILY. 90 tablet 1  . valACYclovir (VALTREX) 1000 MG tablet Take 1 tablet (1,000 mg total) by mouth daily. (Patient taking differently: Take 1,000 mg by mouth daily as needed (OUTBREAKS). ) 28 tablet 1  . oxyCODONE-acetaminophen (ROXICET) 5-325 MG tablet Take 1-2 tablets by mouth every 4 (four) hours as needed for severe pain. (Patient not taking: Reported on 08/03/2016) 60 tablet 0   Current Facility-Administered Medications on File Prior to Visit  Medication Dose Route Frequency Provider Last Rate Last Dose  . cyanocobalamin ((VITAMIN B-12)) injection 1,000 mcg  1,000 mcg Intramuscular Q30 days Claretta Fraise, MD   1,000 mcg at 04/29/16 1700    ROS Review of Systems  Constitutional: Negative for activity change, appetite change and fever.  HENT: Negative for congestion, rhinorrhea and sore throat.   Eyes: Negative for visual disturbance.  Respiratory: Negative for cough and shortness of breath.   Cardiovascular: Negative for chest pain and palpitations.  Gastrointestinal: Negative for abdominal pain, diarrhea and nausea.  Genitourinary: Negative for dysuria.  Musculoskeletal: Negative for arthralgias and myalgias.    Objective:  BP 110/72   Pulse 89   Temp 98 F (36.7 C) (Oral)   Ht '5\' 4"'$  (1.626 m)   Wt 201 lb (91.2 kg)   BMI 34.50 kg/m   BP Readings from Last 3 Encounters:  08/03/16 110/72  06/18/16 109/66  06/13/16 (!) 126/91    Wt Readings from Last 3 Encounters:  08/03/16 201 lb (91.2 kg)  06/17/16 197 lb 9.6 oz (89.6 kg)  06/13/16 197 lb 9.6 oz (89.6 kg)     Physical Exam  Constitutional: She is oriented to person, place, and time. She appears well-developed and  well-nourished. No distress.  HENT:  Head: Normocephalic and atraumatic.  Right Ear: External ear normal.  Left Ear: External ear normal.  Nose: Nose normal.  Mouth/Throat: Oropharynx is clear and moist.  Eyes: Conjunctivae and EOM are normal. Pupils are equal, round, and reactive to light.  Neck: Normal range of motion. Neck supple. No thyromegaly present.  Cardiovascular: Normal rate, regular rhythm and normal heart sounds.   No murmur heard. Pulmonary/Chest: Effort normal and breath sounds normal. No respiratory distress. She has no wheezes. She has no rales.  Abdominal: Soft. Bowel sounds are normal. She exhibits no distension. There is no tenderness.  Lymphadenopathy:    She has no cervical adenopathy.  Neurological: She is alert and oriented to person, place, and time. She has normal reflexes.  Skin: Skin is warm and dry.  Psychiatric: She has a normal mood and affect. Her behavior is normal. Judgment and thought content normal.    Diabetic Foot Exam - Simple   Simple Foot Form Diabetic Foot exam was performed with the following findings:  Yes 08/03/2016  4:00 PM  Visual Inspection No deformities, no ulcerations, no other skin  breakdown bilaterally:  Yes Sensation Testing Intact to touch and monofilament testing bilaterally:  Yes Pulse Check Posterior Tibialis and Dorsalis pulse intact bilaterally:  Yes Comments     Results for orders placed or performed in visit on 07/29/16  CBC with Differential/Platelet  Result Value Ref Range   WBC 6.2 3.4 - 10.8 x10E3/uL   RBC 4.20 3.77 - 5.28 x10E6/uL   Hemoglobin 11.4 11.1 - 15.9 g/dL   Hematocrit 35.5 34.0 - 46.6 %   MCV 85 79 - 97 fL   MCH 27.1 26.6 - 33.0 pg   MCHC 32.1 31.5 - 35.7 g/dL   RDW 15.1 12.3 - 15.4 %   Platelets 234 150 - 379 x10E3/uL   Neutrophils 63 Not Estab. %   Lymphs 22 Not Estab. %   Monocytes 5 Not Estab. %   Eos 9 Not Estab. %   Basos 1 Not Estab. %   Neutrophils Absolute 3.9 1.4 - 7.0 x10E3/uL    Lymphocytes Absolute 1.3 0.7 - 3.1 x10E3/uL   Monocytes Absolute 0.3 0.1 - 0.9 x10E3/uL   EOS (ABSOLUTE) 0.6 (H) 0.0 - 0.4 x10E3/uL   Basophils Absolute 0.0 0.0 - 0.2 x10E3/uL   Immature Granulocytes 0 Not Estab. %   Immature Grans (Abs) 0.0 0.0 - 0.1 x10E3/uL  CMP14+EGFR  Result Value Ref Range   Glucose 92 65 - 99 mg/dL   BUN 21 8 - 27 mg/dL   Creatinine, Ser 1.03 (H) 0.57 - 1.00 mg/dL   GFR calc non Af Amer 58 (L) >59 mL/min/1.73   GFR calc Af Amer 67 >59 mL/min/1.73   BUN/Creatinine Ratio 20 12 - 28   Sodium 144 134 - 144 mmol/L   Potassium 4.2 3.5 - 5.2 mmol/L   Chloride 106 96 - 106 mmol/L   CO2 25 18 - 29 mmol/L   Calcium 10.0 8.7 - 10.3 mg/dL   Total Protein 6.6 6.0 - 8.5 g/dL   Albumin 4.2 3.6 - 4.8 g/dL   Globulin, Total 2.4 1.5 - 4.5 g/dL   Albumin/Globulin Ratio 1.8 1.2 - 2.2   Bilirubin Total 0.2 0.0 - 1.2 mg/dL   Alkaline Phosphatase 159 (H) 39 - 117 IU/L   AST 25 0 - 40 IU/L   ALT 21 0 - 32 IU/L  Lipid panel  Result Value Ref Range   Cholesterol, Total 119 100 - 199 mg/dL   Triglycerides 95 0 - 149 mg/dL   HDL 49 >39 mg/dL   VLDL Cholesterol Cal 19 5 - 40 mg/dL   LDL Calculated 51 0 - 99 mg/dL   Chol/HDL Ratio 2.4 0.0 - 4.4 ratio  TSH + free T4  Result Value Ref Range   TSH 0.357 (L) 0.450 - 4.500 uIU/mL   Free T4 1.24 0.82 - 1.77 ng/dL     Assessment & Plan:   Jaysie was seen today for hypertension.  Diagnoses and all orders for this visit:  Essential hypertension  Gastroesophageal reflux disease without esophagitis  Hyperlipidemia with target LDL less than 100  Hypothyroidism, unspecified type  Primary osteoarthritis of left knee  Adhesive capsulitis of right shoulder  Other orders -     clorazepate (TRANXENE) 3.75 MG tablet; Take 1 tablet (3.75 mg total) by mouth 2 (two) times daily.   I have discontinued Ms. Gladwin's acetaminophen. I am also having her maintain her RESTASIS MULTIDOSE, meloxicam, benazepril-hydrochlorthiazide,  valACYclovir, omeprazole, levothyroxine, aspirin EC, Vitamin D, cetirizine, cyanocobalamin, Fish Oil, (Menthol, Topical Analgesic, (BIOFREEZE EX)), DULoxetine, oxyCODONE-acetaminophen, methocarbamol, amLODipine,  rosuvastatin, and clorazepate. We will continue to administer cyanocobalamin.  Meds ordered this encounter  Medications  . clorazepate (TRANXENE) 3.75 MG tablet    Sig: Take 1 tablet (3.75 mg total) by mouth 2 (two) times daily.    Dispense:  60 tablet    Refill:  5    This request is for a new prescription for a controlled substance as required by Federal/State law.     Follow-up: Return in about 3 months (around 11/03/2016).  Claretta Fraise, M.D.

## 2016-08-10 ENCOUNTER — Encounter: Payer: Self-pay | Admitting: Physical Therapy

## 2016-08-10 ENCOUNTER — Ambulatory Visit: Payer: BLUE CROSS/BLUE SHIELD | Attending: Orthopedic Surgery | Admitting: Physical Therapy

## 2016-08-10 DIAGNOSIS — M25511 Pain in right shoulder: Secondary | ICD-10-CM

## 2016-08-10 DIAGNOSIS — M25611 Stiffness of right shoulder, not elsewhere classified: Secondary | ICD-10-CM | POA: Insufficient documentation

## 2016-08-10 NOTE — Patient Instructions (Addendum)
Strengthening: Resisted External Rotation    Hold tubing in right hand, elbow at side and forearm across body. Rotate forearm out. Repeat __10__ times per set. Do __2__ sets per session. Do __2-3__ sessions per day.  http://orth.exer.us/828   Copyright  VHI. All rights reserved.  Strengthening: Resisted Internal Rotation    Hold tubing in right hand, elbow at side and forearm out. Rotate forearm in across body. Repeat __10__ times per set. Do _2___ sets per session. Do _2-3___ sessions per day.  http://orth.exer.us/830   Copyright  VHI. All rights reserved.  Extremity Flexion (Hook-Lying)    Laying on your back with can of peas in hand, bring your arms back overhead. Repeat _10___ times per set. Do __2__ sets per session. Do __2-3__ sessions per day.  http://orth.exer.us/1088   Copyright  VHI. All rights reserved.  ROM: Saw (Protraction / Retraction)    Reach right arm out in front, then pull arm back, pinching shoulder blades together. Repeat _10___ times per set. Do ___2_ sets per session. Do _2-3___ sessions per day.  http://orth.exer.us/796   Copyright  VHI. All rights reserved.  Progressive Resisted: External Rotation (Side-Lying)    Holding __0-1__ pound weight, towel under arm, raise right forearm toward ceiling. Keep elbow bent and at side. Repeat __10__ times per set. Do __2__ sets per session. Do __2-3__ sessions per day.  http://orth.exer.us/878   Copyright  VHI. All rights reserved.

## 2016-08-10 NOTE — Therapy (Signed)
[facility] [place], [place], [address] Phone: [phone]   Fax:  [phone]  Physical Therapy Treatment  Patient Details  Name: Leslie Robbins MRN: [id number] Date of Birth: June 07, 1955 Referring Provider: Marland Kitchen  Encounter Date: 08/10/2016      PT End of Session - 08/10/16 1031    Visit Number 10   Number of Visits 16   Date for PT Re-Evaluation 08/30/16   PT Start Time 1028   PT Stop Time 1122   PT Time Calculation (min) 54 min   Activity Tolerance Patient tolerated treatment well   Behavior During Therapy [facility] for tasks assessed/performed      Past Medical History:  Diagnosis Date  . Anxiety   . Arthritis   . Depression   . GERD (gastroesophageal reflux disease)   . History of hiatal hernia   . Hypercholesteremia   . Hypertension   . Hypothyroidism     Past Surgical History:  Procedure Laterality Date  . ABDOMINAL HYSTERECTOMY    . BREAST LUMPECTOMY     benign  . CARDIAC CATHETERIZATION     9 YRS AGO  . COLONOSCOPY N/A 05/30/2012   Procedure: COLONOSCOPY;  Surgeon: Rogene Houston, MD;  [facility];  Service: Endoscopy;  Laterality: N/A;  830-moved to 1125 Ann to notify pt  . DILATION AND CURETTAGE OF UTERUS     X2  . JOINT REPLACEMENT Right    right hip  . Right Carpal Tunnel Release    . 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   Procedure: LEFT TOTAL KNEE ARTHROPLASTY;  Surgeon: Gearlean Alf, MD;  [facility];  Service: Orthopedics;  Laterality: Left;  . TOTAL SHOULDER ARTHROPLASTY Right 06/17/2016  . TOTAL SHOULDER ARTHROPLASTY Right 06/17/2016   Procedure: RIGHT TOTAL SHOULDER ARTHROPLASTY;  Surgeon: Netta Cedars, MD;  Location: [place];  Service: Orthopedics;  Laterality: Right;  . TUBAL LIGATION      There were no vitals filed for this visit.      Subjective Assessment - 08/10/16 1029    Subjective Reports that yesterday she intermittantly had pain in various areas yesterday but  her shoulder is better.   Pertinent History HTN, OP, R THA, L TKA   Patient Stated Goals Use right UE without pain.   Currently in Pain? No/denies            [facility] PT Assessment - 08/10/16 0001      Assessment   Medical Diagnosis s/p R TSA     Precautions   Type of Shoulder Precautions Received Order from Dr. Veverly Fells to perform [facility] and AROM and begin strengthening, frequency 1x/week                     [facility] Adult PT Treatment/Exercise - 08/10/16 0001      Elbow Exercises   Elbow Flexion Strengthening;Right;Other reps (comment);Supine;Bar weights/barbell  2# 3x10 reps     Shoulder Exercises: Supine   Flexion AROM;Both;Strengthening  3x10 reps     Shoulder Exercises: Sidelying   External Rotation Strengthening;Right;Other (comment)  3x10 reps     Shoulder Exercises: Standing   External Rotation Strengthening;Right;Theraband  3x10 reps   Theraband Level (Shoulder External Rotation) Level 1 (Yellow)   Internal Rotation Strengthening;Right;Theraband   Theraband Level (Shoulder Internal Rotation) Level 1 (Yellow)   Internal Rotation Limitations 3x10 reps     Shoulder Exercises: Pulleys   Flexion Other (comment)  x5 min   Other Pulley Exercises  standing UE ranger elevation and circles 2x10     Modalities   Modalities Electrical Stimulation;Vasopneumatic     Acupuncturist Location R shoulder   Electrical Stimulation Action Pre-Mod   Electrical Stimulation Parameters 80-150 hz x15 min   Electrical Stimulation Goals Pain     Vasopneumatic   Number Minutes Vasopneumatic  15 minutes   Vasopnuematic Location  Shoulder   Vasopneumatic Pressure Medium   Vasopneumatic Temperature  34     Manual Therapy   Manual Therapy Passive ROM   Passive ROM PROM of R shoulder into flexion/ER                PT Education - 08/10/16 1125    Education provided Yes   Education Details HEP- resisted ER/IR, supine flexion, SL  ER, row   Person(s) Educated Patient   Methods Explanation;Demonstration;Verbal cues;Handout   Comprehension Verbalized understanding;Verbal cues required             PT Long Term Goals - 08/03/16 1123      PT LONG TERM GOAL #1   Title Independent with a HEP.   Time 8   Period Weeks   Status On-going     PT LONG TERM GOAL #2   Title Active right shoulder ER to 65 degrees+ to allow for easily donning/doffing of apparel.   Time 8   Period Weeks   Status On-going  AROM 55 degrees 08/03/16     PT LONG TERM GOAL #3   Title Increase right shoulder strength to a solid 5/5 to increase stability for performance of functional activities   Time 8   Period Weeks   Status On-going     PT LONG TERM GOAL #4   Title Perform ADL's with right shoulder pain not > 2/10.   Time 8   Period Weeks   Status On-going     PT LONG TERM GOAL #5   Title Increase R shoulder flex to 160 deg to perform OH ADLS and allow RTW.   Time 8   Period Weeks   Status On-going               Plan - 08/10/16 1252    Clinical Impression Statement Patient continues to progress well within protocol with gentle strengthening exercises. Patient demonstrated greater difficulty with IR resisted than she experienced with ER resisted. Patient also conducted flexion strengthening in supine AROM as well as with1# handweights with no complaints from patient. Patient provided new HEP and instructions for completion with verbalizing understanding of instructions. Normal modalities response noted following removal of the modalities.   Rehab Potential Excellent   Clinical Impairments Affecting Rehab Potential 06/17/13 surgery - current 6 weeks /25/18   PT Frequency 2x / week   PT Duration 8 weeks   PT Treatment/Interventions ADLs/Self Care Home Management;Cryotherapy;Electrical Stimulation;Moist Heat;Therapeutic exercise;Therapeutic activities;Manual techniques;Passive range of motion;Vasopneumatic Device;Neuromuscular  re-education;Patient/family education   PT Next Visit Plan cont per TSA protocol, recert to extend date to cover 8 additional weeks of therapy at [place] pendulums, "rock the baby", AA hitchhiker, AA flexion, standing elbow ext/flex   Consulted and Agree with Plan of Care Patient      Patient will benefit from skilled therapeutic intervention in order to improve the following deficits and impairments:  Pain, Decreased strength, Decreased range of motion  Visit Diagnosis: Stiffness of right shoulder, not elsewhere classified  Acute pain of right shoulder  Problem List Patient Active Problem List   Diagnosis Date Noted  . Degenerative arthritis of right shoulder region 06/18/2016  . S/P shoulder replacement, right 06/17/2016  . Bursitis, hip 07/27/2015  . Lichen sclerosus of female genitalia 12/30/2013  . OA (osteoarthritis) of knee 09/16/2013  . GERD (gastroesophageal reflux disease) 07/11/2013  . GAD (generalized anxiety disorder) 07/11/2013  . Depression with anxiety 07/11/2013  . Hyperlipidemia with target LDL less than 100 07/11/2013  . Hypothyroidism 07/11/2013  . Essential hypertension 12/19/2008    Wynelle Fanny, PTA 08/10/2016, 1:14 PM  [facility] [address] [place], [place], [address] Phone: [phone]   Fax:  [phone]  Name: Leslie Robbins MRN: [id number] Date of Birth: 1955-05-23

## 2016-08-11 ENCOUNTER — Other Ambulatory Visit: Payer: Self-pay | Admitting: *Deleted

## 2016-08-11 ENCOUNTER — Other Ambulatory Visit: Payer: Self-pay | Admitting: Family Medicine

## 2016-08-11 MED ORDER — DULOXETINE HCL 30 MG PO CPEP: 90.0000 mg | ORAL_CAPSULE | Freq: Every day | ORAL | 0 refills | Status: DC

## 2016-08-12 ENCOUNTER — Other Ambulatory Visit: Payer: Self-pay | Admitting: Family Medicine

## 2016-08-12 ENCOUNTER — Ambulatory Visit (INDEPENDENT_AMBULATORY_CARE_PROVIDER_SITE_OTHER): Payer: BLUE CROSS/BLUE SHIELD | Admitting: Family Medicine

## 2016-08-12 ENCOUNTER — Encounter: Payer: Self-pay | Admitting: Family Medicine

## 2016-08-12 VITALS — BP 111/81 | HR 81 | Temp 98.3°F | Ht 64.0 in | Wt 201.5 lb

## 2016-08-12 DIAGNOSIS — M1711 Unilateral primary osteoarthritis, right knee: Secondary | ICD-10-CM | POA: Diagnosis not present

## 2016-08-12 DIAGNOSIS — M25561 Pain in right knee: Secondary | ICD-10-CM

## 2016-08-12 MED ORDER — METHYLPREDNISOLONE ACETATE 80 MG/ML IJ SUSP
80.0000 mg | Freq: Once | INTRAMUSCULAR | Status: AC
  Administered 2016-08-12: 80 mg via INTRAMUSCULAR

## 2016-08-12 MED ORDER — LEVOTHYROXINE SODIUM 112 MCG PO TABS: ORAL_TABLET | ORAL | 10 refills | Status: DC

## 2016-08-12 NOTE — Progress Notes (Signed)
BP 111/81   Pulse 81   Temp 98.3 F (36.8 C) (Oral)   Ht 5\' 4"  (1.626 m)   Wt 201 lb 8 oz (91.4 kg)   BMI 34.59 kg/m    Subjective:    Patient ID: Leslie Robbins, female    DOB: August 30, 1955, 61 y.o.   MRN: [id number]  HPI: Leslie Robbins is a 61 y.o. female presenting on 08/12/2016 for Knee Pain (pain and swelling in right knee; has appointment on 08/30/16 with [facility] -- recently had right shoulder replaced and had Percocet left over, took one of those earlier todayl)   HPI Right knee pain/arthritis  Patient comes in today complaining of right knee pain and swelling. She has an appointment on 08/30/2016 [facility]. She is just sitting here today to see if there is something that we can do to help her get to that appointment and get some relief in the meantime. She rates the pain as a 7 out of 10 and had the significant swelling in her right knee. She denies any popping or catching or giving way. She doesn't fully feels like it's arthritis has been told that it is probably arthritis previously. She denies any fevers or chills or erythema or warmth in the knee. She had of Percocet left over from a shoulder surgery and take one of those earlier today and it did help but she is looking to see if there is something that can give her more relief or maybe longer lasting relief.  Relevant past medical, surgical, family and social history reviewed and updated as indicated. Interim medical history since our last visit reviewed. Allergies and medications reviewed and updated.  Review of Systems  Constitutional: Negative for chills and fever.  Respiratory: Negative for chest tightness and shortness of breath.   Cardiovascular: Negative for chest pain and leg swelling.  Musculoskeletal: Positive for arthralgias and joint swelling. Negative for back pain and gait problem.  Skin: Negative for color change and rash.  Neurological: Negative for light-headedness and headaches.    Psychiatric/Behavioral: Negative for agitation and behavioral problems.  All other systems reviewed and are negative.   Per HPI unless specifically indicated above        Objective:    BP 111/81   Pulse 81   Temp 98.3 F (36.8 C) (Oral)   Ht 5\' 4"  (1.626 m)   Wt 201 lb 8 oz (91.4 kg)   BMI 34.59 kg/m   Wt Readings from Last 3 Encounters:  08/12/16 201 lb 8 oz (91.4 kg)  08/03/16 201 lb (91.2 kg)  06/17/16 197 lb 9.6 oz (89.6 kg)    Physical Exam  Constitutional: She is oriented to person, place, and time. She appears well-developed and well-nourished. No distress.  Eyes: Conjunctivae are normal.  Cardiovascular: Normal rate, regular rhythm, normal heart sounds and intact distal pulses.   No murmur heard. Pulmonary/Chest: Effort normal and breath sounds normal. No respiratory distress. She has no wheezes.  Musculoskeletal: Normal range of motion. She exhibits no edema.       Right knee: She exhibits swelling. She exhibits normal range of motion, no deformity, no erythema, normal alignment, no LCL laxity, normal patellar mobility, normal meniscus and no MCL laxity. Tenderness found. Medial joint line tenderness noted.  Neurological: She is alert and oriented to person, place, and time. Coordination normal.  Skin: Skin is warm and dry. No rash noted. She is not diaphoretic.  Psychiatric: She has a normal mood and  affect. Her behavior is normal.  Nursing note and vitals reviewed.   Knee injection: Risk factors of bleeding and infection discussed with patient and patient is agreeable towards injection. Patient prepped with Betadine. Lateral approach towards injection used. Injected 80 mg of Depo-Medrol and 1 mL of 2% lidocaine. Patient tolerated procedure well and no side effects from noted. Minimal to no bleeding. Simple bandage applied after.     Assessment & Plan:   Problem List Items Addressed This Visit      Musculoskeletal and Integument   OA (osteoarthritis) of knee  - Primary   Relevant Medications   Acetaminophen (TYLENOL ARTHRITIS EXT RELIEF PO)   methylPREDNISolone acetate (DEPO-MEDROL) injection 80 mg (Start on 08/12/2016  3:15 PM)       Follow up plan: Return if symptoms worsen or fail to improve.  Counseling provided for all of the vaccine components No orders of the defined types were placed in this encounter.   Caryl Pina, MD [facility] 08/12/2016, 3:12 PM

## 2016-08-12 NOTE — Telephone Encounter (Signed)
Yes, okay, because free T4 is in normal range.

## 2016-08-12 NOTE — Telephone Encounter (Signed)
Script is ready at pharmacy. 

## 2016-08-17 ENCOUNTER — Ambulatory Visit: Payer: BLUE CROSS/BLUE SHIELD | Admitting: Physical Therapy

## 2016-08-17 DIAGNOSIS — M25511 Pain in right shoulder: Secondary | ICD-10-CM | POA: Diagnosis not present

## 2016-08-17 DIAGNOSIS — M25611 Stiffness of right shoulder, not elsewhere classified: Secondary | ICD-10-CM

## 2016-08-17 NOTE — Therapy (Signed)
[facility] [place], [place], [address] Phone: [phone]   Fax:  [phone]  Physical Therapy Treatment  Patient Details  Name: Leslie Robbins MRN: [id number] Date of Birth: [birth year] Referring Provider: Marland Kitchen  Encounter Date: 08/17/2016      PT End of Session - 08/17/16 1034    Visit Number 11   Number of Visits 16   Date for PT Re-Evaluation 08/30/16   PT Start Time 1032   PT Stop Time 1122   PT Time Calculation (min) 50 min   Activity Tolerance Patient tolerated treatment well   Behavior During Therapy [facility] for tasks assessed/performed      Past Medical History:  Diagnosis Date  . Anxiety   . Arthritis   . Depression   . GERD (gastroesophageal reflux disease)   . History of hiatal hernia   . Hypercholesteremia   . Hypertension   . Hypothyroidism     Past Surgical History:  Procedure Laterality Date  . ABDOMINAL HYSTERECTOMY    . BREAST LUMPECTOMY     benign  . CARDIAC CATHETERIZATION     9 YRS AGO  . COLONOSCOPY N/A 05/30/2012   Procedure: COLONOSCOPY;  Surgeon: Rogene Houston, MD;  [facility];  Service: Endoscopy;  Laterality: N/A;  830-moved to 1125 Ann to notify pt  . DILATION AND CURETTAGE OF UTERUS     X2  . JOINT REPLACEMENT Right    right hip  . Right Carpal Tunnel Release    . 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   Procedure: LEFT TOTAL KNEE ARTHROPLASTY;  Surgeon: Gearlean Alf, MD;  [facility];  Service: Orthopedics;  Laterality: Left;  . TOTAL SHOULDER ARTHROPLASTY Right 06/17/2016  . TOTAL SHOULDER ARTHROPLASTY Right 06/17/2016   Procedure: RIGHT TOTAL SHOULDER ARTHROPLASTY;  Surgeon: Netta Cedars, MD;  Location: [place];  Service: Orthopedics;  Laterality: Right;  . TUBAL LIGATION      There were no vitals filed for this visit.      Subjective Assessment - 08/17/16 1032    Subjective Reports that she woke up with stiffness and has already been working her shoulder  this morning.   Pertinent History HTN, OP, R THA, L TKA   Patient Stated Goals Use right UE without pain.   Currently in Pain? No/denies            [facility] PT Assessment - 08/17/16 0001      Assessment   Medical Diagnosis s/p R TSA   Onset Date/Surgical Date 06/17/16   Next MD Visit 09/08/2016     Precautions   Type of Shoulder Precautions Received Order from Dr. Veverly Fells to perform AAROM and AROM and begin strengthening, frequency 1x/week     Restrictions   Weight Bearing Restrictions No                     [facility] Adult PT Treatment/Exercise - 08/17/16 0001      Exercises   Exercises Shoulder;Elbow     Elbow Exercises   Elbow Flexion Strengthening;Right;Other reps (comment);Supine;Bar weights/barbell   Bar Weights/Barbell (Elbow Flexion) 2 lbs   Elbow Flexion Limitations 3x10 reps     Shoulder Exercises: Supine   Protraction Strengthening;Right;Weights   Protraction Weight (lbs) 2   Protraction Limitations 3x10 reps     Shoulder Exercises: Prone   Retraction Strengthening;Right;Weights   Retraction Weight (lbs) 2   Retraction Limitations 3x10 reps   Extension Strengthening;Right;Weights  3x10 reps  Extension Weight (lbs) 2     Shoulder Exercises: Sidelying   External Rotation Strengthening;Right  3x10 reps   ABduction AROM;Right;20 reps     Shoulder Exercises: Standing   External Rotation Strengthening;Right;Theraband  3x10 reps   Theraband Level (Shoulder External Rotation) Level 1 (Yellow)   Internal Rotation Strengthening;Right;Theraband   Theraband Level (Shoulder Internal Rotation) Level 1 (Yellow)   Internal Rotation Limitations 3x10 reps   Flexion Strengthening;Both;Weights   Shoulder Flexion Weight (lbs) 2   Flexion Limitations 3x10 reps     Shoulder Exercises: Pulleys   Flexion Other (comment)  x5 min   Other Pulley Exercises Wall slides, CW and CCW circles x30 reps     Shoulder Exercises: ROM/Strengthening   Other  ROM/Strengthening Exercises D1/D2 AROM RUE x20 reps     Modalities   Modalities Electrical Stimulation;Vasopneumatic     Electrical Stimulation   Electrical Stimulation Location R shoulder   Electrical Stimulation Action Pre-Mod   Electrical Stimulation Parameters 80-150 hz x15 min   Electrical Stimulation Goals Pain     Vasopneumatic   Number Minutes Vasopneumatic  15 minutes   Vasopnuematic Location  Shoulder   Vasopneumatic Pressure Low   Vasopneumatic Temperature  34                     PT Long Term Goals - 08/03/16 1123      PT LONG TERM GOAL #1   Title Independent with a HEP.   Time 8   Period Weeks   Status On-going     PT LONG TERM GOAL #2   Title Active right shoulder ER to 65 degrees+ to allow for easily donning/doffing of apparel.   Time 8   Period Weeks   Status On-going  AROM 55 degrees 08/03/16     PT LONG TERM GOAL #3   Title Increase right shoulder strength to a solid 5/5 to increase stability for performance of functional activities   Time 8   Period Weeks   Status On-going     PT LONG TERM GOAL #4   Title Perform ADL's with right shoulder pain not > 2/10.   Time 8   Period Weeks   Status On-going     PT LONG TERM GOAL #5   Title Increase R shoulder flex to 160 deg to perform OH ADLS and allow RTW.   Time 8   Period Weeks   Status On-going               Plan - 08/17/16 1126    Clinical Impression Statement Patient continues to do very well in treatment today with AROM and strengthening exercises. R shoulder PNF completed actively with patient experiencing greatest discomfort and difficulty with D2 pattern. Patient demonstrated good form with exercises and no complaints of discomfort with any exercises today. Weakness/fatigue also noted with wall slides as well as wall wash. Normal modalities response noted following removal of the modalities.   Rehab Potential Excellent   Clinical Impairments Affecting Rehab Potential  06/17/13 surgery - current 6 weeks /25/18   PT Frequency 2x / week   PT Duration 8 weeks   PT Treatment/Interventions ADLs/Self Care Home Management;Cryotherapy;Electrical Stimulation;Moist Heat;Therapeutic exercise;Therapeutic activities;Manual techniques;Passive range of motion;Vasopneumatic Device;Neuromuscular re-education;Patient/family education   PT Next Visit Plan cont per TSA protocol, recert to extend date to cover 8 additional weeks of therapy at [place] pendulums, "rock the baby", AA hitchhiker, AA flexion, standing elbow ext/flex  Patient will benefit from skilled therapeutic intervention in order to improve the following deficits and impairments:  Pain, Decreased strength, Decreased range of motion  Visit Diagnosis: Stiffness of right shoulder, not elsewhere classified  Acute pain of right shoulder     Problem List Patient Active Problem List   Diagnosis Date Noted  . Degenerative arthritis of right shoulder region 06/18/2016  . S/P shoulder replacement, right 06/17/2016  . Bursitis, hip 07/27/2015  . Lichen sclerosus of female genitalia 12/30/2013  . OA (osteoarthritis) of knee 09/16/2013  . GERD (gastroesophageal reflux disease) 07/11/2013  . GAD (generalized anxiety disorder) 07/11/2013  . Depression with anxiety 07/11/2013  . Hyperlipidemia with target LDL less than 100 07/11/2013  . Hypothyroidism 07/11/2013  . Essential hypertension 12/19/2008    Wynelle Fanny, PTA 08/17/2016, 11:41 AM  [facility] [address] [place], [place], [address] Phone: [phone]   Fax:  [phone]  Name: Leslie Robbins MRN: [id number] Date of Birth: 02/13/1956

## 2016-08-21 ENCOUNTER — Other Ambulatory Visit: Payer: Self-pay | Admitting: Family Medicine

## 2016-08-24 ENCOUNTER — Encounter: Payer: Self-pay | Admitting: Physical Therapy

## 2016-08-24 ENCOUNTER — Ambulatory Visit: Payer: BLUE CROSS/BLUE SHIELD | Admitting: Physical Therapy

## 2016-08-24 DIAGNOSIS — M25511 Pain in right shoulder: Secondary | ICD-10-CM

## 2016-08-24 DIAGNOSIS — M25611 Stiffness of right shoulder, not elsewhere classified: Secondary | ICD-10-CM | POA: Diagnosis not present

## 2016-08-24 DIAGNOSIS — H5213 Myopia, bilateral: Secondary | ICD-10-CM | POA: Diagnosis not present

## 2016-08-24 NOTE — Therapy (Signed)
[facility] [place], [place], [address] Phone: [phone]   Fax:  [phone]  Physical Therapy Treatment  Patient Details  Name: Leslie Robbins MRN: [id number] Date of Birth: 11-25-1955 Referring Provider: Marland Kitchen  Encounter Date: 08/24/2016      PT End of Session - 08/24/16 1033    Visit Number 12   Number of Visits 16   Date for PT Re-Evaluation 08/30/16   PT Start Time 1031   PT Stop Time 1116   PT Time Calculation (min) 45 min   Activity Tolerance Patient tolerated treatment well   Behavior During Therapy [facility] for tasks assessed/performed      Past Medical History:  Diagnosis Date  . Anxiety   . Arthritis   . Depression   . GERD (gastroesophageal reflux disease)   . History of hiatal hernia   . Hypercholesteremia   . Hypertension   . Hypothyroidism     Past Surgical History:  Procedure Laterality Date  . ABDOMINAL HYSTERECTOMY    . BREAST LUMPECTOMY     benign  . CARDIAC CATHETERIZATION     9 YRS AGO  . COLONOSCOPY N/A 05/30/2012   Procedure: COLONOSCOPY;  Surgeon: Rogene Houston, MD;  [facility];  Service: Endoscopy;  Laterality: N/A;  830-moved to 1125 Ann to notify pt  . DILATION AND CURETTAGE OF UTERUS     X2  . JOINT REPLACEMENT Right    right hip  . Right Carpal Tunnel Release    . 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   Procedure: LEFT TOTAL KNEE ARTHROPLASTY;  Surgeon: Gearlean Alf, MD;  [facility];  Service: Orthopedics;  Laterality: Left;  . TOTAL SHOULDER ARTHROPLASTY Right 06/17/2016  . TOTAL SHOULDER ARTHROPLASTY Right 06/17/2016   Procedure: RIGHT TOTAL SHOULDER ARTHROPLASTY;  Surgeon: Netta Cedars, MD;  Location: [place];  Service: Orthopedics;  Laterality: Right;  . TUBAL LIGATION      There were no vitals filed for this visit.      Subjective Assessment - 08/24/16 1032    Subjective Reports that she has been unable to complete exercises secondary to her mother  having a stroke last week. Reports that she is weak from not doing exercises but is supposed to return to work next week.   Pertinent History HTN, OP, R THA, L TKA   Patient Stated Goals Use right UE without pain.   Currently in Pain? No/denies            [facility] PT Assessment - 08/24/16 0001      Assessment   Medical Diagnosis s/p R TSA   Onset Date/Surgical Date 06/17/16   Next MD Visit 09/08/2016     Precautions   Type of Shoulder Precautions Received Order from Dr. Veverly Fells to perform AAROM and AROM and begin strengthening, frequency 1x/week     Restrictions   Weight Bearing Restrictions No                     [facility] Adult PT Treatment/Exercise - 08/24/16 0001      Elbow Exercises   Elbow Flexion Strengthening;Right;Other reps (comment);Supine;Bar weights/barbell   Bar Weights/Barbell (Elbow Flexion) 2 lbs   Elbow Flexion Limitations 3x10 reps     Shoulder Exercises: Supine   Flexion Strengthening;Both;20 reps;Weights   Shoulder Flexion Weight (lbs) 2     Shoulder Exercises: Seated   Flexion AROM;Both  45 deg reclinded 3x10 reps     Shoulder Exercises: Prone  Retraction Strengthening;Right;Weights   Retraction Weight (lbs) 2   Retraction Limitations 3x10 reps   Extension Strengthening;Right;Weights  3x10 reps   Extension Weight (lbs) 2     Shoulder Exercises: Standing   Protraction Strengthening;Right;20 reps;Theraband   Theraband Level (Shoulder Protraction) Level 2 (Red)   External Rotation Strengthening;Right;20 reps;Theraband   Theraband Level (Shoulder External Rotation) Level 2 (Red)   Internal Rotation Strengthening;Right;20 reps;Theraband   Theraband Level (Shoulder Internal Rotation) Level 2 (Red)   Extension Strengthening;Right;20 reps;Theraband   Theraband Level (Shoulder Extension) Level 2 (Red)   Row Strengthening;Right;20 reps;Theraband   Theraband Level (Shoulder Row) Level 2 (Red)     Shoulder Exercises: Pulleys   Flexion Other  (comment)  x5 min   Other Pulley Exercises Wall slides x20 reps   Other Pulley Exercises UE ranger in flex/abduct/circles x20 reps     Shoulder Exercises: ROM/Strengthening   Wall Wash x15 reps CW and CCW   Other ROM/Strengthening Exercises D1/D2 AROM RUE x15 reps     Modalities   Modalities Electrical Stimulation;Vasopneumatic     Electrical Stimulation   Electrical Stimulation Location R shoulder   Electrical Stimulation Action Pre-Mod   Electrical Stimulation Parameters 80-150 hz x15 min   Electrical Stimulation Goals Pain     Vasopneumatic   Number Minutes Vasopneumatic  15 minutes   Vasopnuematic Location  Shoulder   Vasopneumatic Pressure Low   Vasopneumatic Temperature  34                     PT Long Term Goals - 08/03/16 1123      PT LONG TERM GOAL #1   Title Independent with a HEP.   Time 8   Period Weeks   Status On-going     PT LONG TERM GOAL #2   Title Active right shoulder ER to 65 degrees+ to allow for easily donning/doffing of apparel.   Time 8   Period Weeks   Status On-going  AROM 55 degrees 08/03/16     PT LONG TERM GOAL #3   Title Increase right shoulder strength to a solid 5/5 to increase stability for performance of functional activities   Time 8   Period Weeks   Status On-going     PT LONG TERM GOAL #4   Title Perform ADL's with right shoulder pain not > 2/10.   Time 8   Period Weeks   Status On-going     PT LONG TERM GOAL #5   Title Increase R shoulder flex to 160 deg to perform OH ADLS and allow RTW.   Time 8   Period Weeks   Status On-going               Plan - 08/24/16 1103    Clinical Impression Statement Patient tolerated today's treatment fairly well today although limited by fatigue in antigravity exercises. Patient required minimal assist from LUE with antigravity exercises today secondary to fatigue where she has been unable to complete HEP. Lawnchair progression intiated today to assist with improving  strength for antigravity exercises. Patient encouraged to continue HEP at home to regain strength lost due to break and to prepare for returning to work. Normal modalities response noted following removal of the modalities.   Rehab Potential Excellent   Clinical Impairments Affecting Rehab Potential 06/17/13 surgery - current 6 weeks /25/18   PT Frequency 2x / week   PT Duration 8 weeks   PT Treatment/Interventions ADLs/Self Care Home Management;Cryotherapy;Electrical Stimulation;Moist Heat;Therapeutic exercise;Therapeutic activities;Manual  techniques;Passive range of motion;Vasopneumatic Device;Neuromuscular re-education;Patient/family education   PT Next Visit Plan cont per TSA protocol, recert to extend date to cover 8 additional weeks of therapy at [facility] pendulums, "rock the baby", AA hitchhiker, AA flexion, standing elbow ext/flex   Consulted and Agree with Plan of Care Patient      Patient will benefit from skilled therapeutic intervention in order to improve the following deficits and impairments:  Pain, Decreased strength, Decreased range of motion  Visit Diagnosis: Stiffness of right shoulder, not elsewhere classified  Acute pain of right shoulder     Problem List Patient Active Problem List   Diagnosis Date Noted  . Degenerative arthritis of right shoulder region 06/18/2016  . S/P shoulder replacement, right 06/17/2016  . Bursitis, hip 07/27/2015  . Lichen sclerosus of female genitalia 12/30/2013  . OA (osteoarthritis) of knee 09/16/2013  . GERD (gastroesophageal reflux disease) 07/11/2013  . GAD (generalized anxiety disorder) 07/11/2013  . Depression with anxiety 07/11/2013  . Hyperlipidemia with target LDL less than 100 07/11/2013  . Hypothyroidism 07/11/2013  . Essential hypertension 12/19/2008    Wynelle Fanny, PTA 08/24/2016, 11:24 AM  [facility] [address] [place], [place],  [address] Phone: [phone]   Fax:  [phone]  Name: Leslie Robbins MRN: [id number] Date of Birth: 05/12/1955

## 2016-08-26 DIAGNOSIS — Z471 Aftercare following joint replacement surgery: Secondary | ICD-10-CM | POA: Diagnosis not present

## 2016-08-26 DIAGNOSIS — M1712 Unilateral primary osteoarthritis, left knee: Secondary | ICD-10-CM | POA: Diagnosis not present

## 2016-08-26 DIAGNOSIS — M1711 Unilateral primary osteoarthritis, right knee: Secondary | ICD-10-CM | POA: Diagnosis not present

## 2016-08-26 DIAGNOSIS — Z96652 Presence of left artificial knee joint: Secondary | ICD-10-CM | POA: Diagnosis not present

## 2016-08-29 ENCOUNTER — Encounter: Payer: Self-pay | Admitting: Physical Therapy

## 2016-08-29 ENCOUNTER — Ambulatory Visit: Payer: BLUE CROSS/BLUE SHIELD | Admitting: Physical Therapy

## 2016-08-29 DIAGNOSIS — M25611 Stiffness of right shoulder, not elsewhere classified: Secondary | ICD-10-CM | POA: Diagnosis not present

## 2016-08-29 DIAGNOSIS — M25511 Pain in right shoulder: Secondary | ICD-10-CM | POA: Diagnosis not present

## 2016-08-29 NOTE — Therapy (Signed)
[facility] [place], [place], [address] Phone: [phone]   Fax:  [phone]  Physical Therapy Treatment  Patient Details  Name: Leslie Robbins MRN: [id number] Date of Birth: [birth year] Referring Provider: Marland Kitchen  Encounter Date: 08/29/2016      PT End of Session - 08/29/16 1059    Visit Number 13   Number of Visits 16   Date for PT Re-Evaluation 08/30/16   PT Start Time 1031   PT Stop Time 1114   PT Time Calculation (min) 43 min   Activity Tolerance Patient tolerated treatment well   Behavior During Therapy [facility] for tasks assessed/performed      Past Medical History:  Diagnosis Date  . Anxiety   . Arthritis   . Depression   . GERD (gastroesophageal reflux disease)   . History of hiatal hernia   . Hypercholesteremia   . Hypertension   . Hypothyroidism     Past Surgical History:  Procedure Laterality Date  . ABDOMINAL HYSTERECTOMY    . BREAST LUMPECTOMY     benign  . CARDIAC CATHETERIZATION     9 YRS AGO  . COLONOSCOPY N/A 05/30/2012   Procedure: COLONOSCOPY;  Surgeon: Rogene Houston, MD;  [facility];  Service: Endoscopy;  Laterality: N/A;  830-moved to 1125 Ann to notify pt  . DILATION AND CURETTAGE OF UTERUS     X2  . JOINT REPLACEMENT Right    right hip  . Right Carpal Tunnel Release    . 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   Procedure: LEFT TOTAL KNEE ARTHROPLASTY;  Surgeon: Gearlean Alf, MD;  [facility];  Service: Orthopedics;  Laterality: Left;  . TOTAL SHOULDER ARTHROPLASTY Right 06/17/2016  . TOTAL SHOULDER ARTHROPLASTY Right 06/17/2016   Procedure: RIGHT TOTAL SHOULDER ARTHROPLASTY;  Surgeon: Netta Cedars, MD;  Location: [place];  Service: Orthopedics;  Laterality: Right;  . TUBAL LIGATION      There were no vitals filed for this visit.      Subjective Assessment - 08/29/16 1035    Subjective Patient reported doing well overall some ongoing soreness with certain movements,  no pain per patient   Pertinent History HTN, OP, R THA, L TKA   Patient Stated Goals Use right UE without pain.   Currently in Pain? No/denies   Pain Location Shoulder   Pain Orientation Right   Pain Descriptors / Indicators Discomfort   Pain Type Surgical pain   Pain Onset More than a month ago   Pain Frequency Intermittent   Aggravating Factors  certain movements   Pain Relieving Factors at rest            [facility] PT Assessment - 08/29/16 0001      ROM / Strength   AROM / PROM / Strength AROM     AROM   AROM Assessment Site Shoulder   Right/Left Shoulder Right   Right Shoulder Flexion 145 Degrees                     [facility] Adult PT Treatment/Exercise - 08/29/16 0001      Shoulder Exercises: Prone   Retraction Strengthening;Right;Weights   Retraction Weight (lbs) 2   Retraction Limitations 3x10 reps   Extension Strengthening;Right;Weights  3x10   Extension Weight (lbs) 2     Shoulder Exercises: Standing   Protraction Strengthening;Right;Theraband  x30   Theraband Level (Shoulder Protraction) Level 2 (Red)   External Rotation Strengthening;Right;Theraband  x30  Theraband Level (Shoulder External Rotation) Level 2 (Red)   Internal Rotation Strengthening;Right;Theraband  x30   Theraband Level (Shoulder Internal Rotation) Level 2 (Red)   Extension Strengthening;Right;Theraband  x30   Theraband Level (Shoulder Extension) Level 2 (Red)     Shoulder Exercises: Pulleys   Flexion Other (comment)  40mn   Other Pulley Exercises Wall slides x30 reps eccentric lowering   Other Pulley Exercises UE ranger in flex/abduct/circles x20 reps     Shoulder Exercises: ROM/Strengthening   Other ROM/Strengthening Exercises D1/D2 2# 2x10     Electrical Stimulation   Electrical Stimulation Location R shoulder   Electrical Stimulation Action premod   Electrical Stimulation Parameters 80-150hz x150m   Electrical Stimulation Goals Pain     Vasopneumatic   Number  Minutes Vasopneumatic  15 minutes   Vasopnuematic Location  Shoulder   Vasopneumatic Pressure Low                     PT Long Term Goals - 08/29/16 1100      PT LONG TERM GOAL #1   Title Independent with a HEP.   Time 8   Period Weeks   Status Achieved     PT LONG TERM GOAL #2   Title Active right shoulder ER to 65 degrees+ to allow for easily donning/doffing of apparel.   Time 8   Period Weeks   Status On-going     PT LONG TERM GOAL #3   Title Increase right shoulder strength to a solid 5/5 to increase stability for performance of functional activities   Time 8   Period Weeks   Status On-going     PT LONG TERM GOAL #4   Title Perform ADL's with right shoulder pain not > 2/10.   Time 8   Period Weeks   Status Achieved     PT LONG TERM GOAL #5   Title Increase R shoulder flex to 160 deg to perform OH ADLS and allow RTW.   Time 8   Period Weeks   Status On-going               Plan - 08/29/16 1101    Clinical Impression Statement Patient tolerated treatment well today. Patient reported no pain only a fatigue with prolong activity. Patient able to progress strengthening exercises with only fatigue. Patient reported she will return to work on wednesday. Patient doing HEP daily and today issued red t-band for progression. Patient requested modalities after treatment today. Patient has met LTG #1 and #4 today others ongoing due to strength deficts.    Rehab Potential Excellent   Clinical Impairments Affecting Rehab Potential 06/17/13 surgery - current 10 weeks 08/26/16   PT Frequency 2x / week   PT Duration 8 weeks   PT Treatment/Interventions ADLs/Self Care Home Management;Cryotherapy;Electrical Stimulation;Moist Heat;Therapeutic exercise;Therapeutic activities;Manual techniques;Passive range of motion;Vasopneumatic Device;Neuromuscular re-education;Patient/family education   PT Next Visit Plan cont per TSA protocol (MD. NoVeverly Fells/5/18)   Consulted and Agree  with Plan of Care Patient      Patient will benefit from skilled therapeutic intervention in order to improve the following deficits and impairments:  Pain, Decreased strength, Decreased range of motion  Visit Diagnosis: Stiffness of right shoulder, not elsewhere classified  Acute pain of right shoulder     Problem List Patient Active Problem List   Diagnosis Date Noted  . Degenerative arthritis of right shoulder region 06/18/2016  . S/P shoulder replacement, right 06/17/2016  . Bursitis, hip 07/27/2015  .  Lichen sclerosus of female genitalia 12/30/2013  . OA (osteoarthritis) of knee 09/16/2013  . GERD (gastroesophageal reflux disease) 07/11/2013  . GAD (generalized anxiety disorder) 07/11/2013  . Depression with anxiety 07/11/2013  . Hyperlipidemia with target LDL less than 100 07/11/2013  . Hypothyroidism 07/11/2013  . Essential hypertension 12/19/2008    ,  P, PTA 08/29/2016, 11:14 AM  [facility] [address] [place], [place], [address] Phone: [phone]   Fax:  [phone]  Name: Leslie Robbins MRN: [id number] Date of Birth: 1955/06/11

## 2016-09-06 ENCOUNTER — Ambulatory Visit: Payer: BLUE CROSS/BLUE SHIELD | Attending: Orthopedic Surgery | Admitting: *Deleted

## 2016-09-06 DIAGNOSIS — M25511 Pain in right shoulder: Secondary | ICD-10-CM

## 2016-09-06 DIAGNOSIS — M25611 Stiffness of right shoulder, not elsewhere classified: Secondary | ICD-10-CM | POA: Insufficient documentation

## 2016-09-06 NOTE — Therapy (Addendum)
[facility] [place], [place], [address] Phone: [phone]   Fax:  [phone]  Physical Therapy Treatment  Patient Details  Name: Leslie Robbins MRN: [id number] Date of Birth: 08-Oct-1955 Referring Provider: Marland Kitchen  Encounter Date: 09/06/2016      PT End of Session - 09/06/16 1055    Visit Number 14   Number of Visits 16   Date for PT Re-Evaluation 08/30/16   PT Start Time 1031   PT Stop Time 1122   PT Time Calculation (min) 51 min      Past Medical History:  Diagnosis Date  . Anxiety   . Arthritis   . Depression   . GERD (gastroesophageal reflux disease)   . History of hiatal hernia   . Hypercholesteremia   . Hypertension   . Hypothyroidism     Past Surgical History:  Procedure Laterality Date  . ABDOMINAL HYSTERECTOMY    . BREAST LUMPECTOMY     benign  . CARDIAC CATHETERIZATION     9 YRS AGO  . COLONOSCOPY N/A 05/30/2012   Procedure: COLONOSCOPY;  Surgeon: Rogene Houston, MD;  [facility];  Service: Endoscopy;  Laterality: N/A;  830-moved to 1125 Ann to notify pt  . DILATION AND CURETTAGE OF UTERUS     X2  . JOINT REPLACEMENT Right    right hip  . Right Carpal Tunnel Release    . 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   Procedure: LEFT TOTAL KNEE ARTHROPLASTY;  Surgeon: Gearlean Alf, MD;  [facility];  Service: Orthopedics;  Laterality: Left;  . TOTAL SHOULDER ARTHROPLASTY Right 06/17/2016  . TOTAL SHOULDER ARTHROPLASTY Right 06/17/2016   Procedure: RIGHT TOTAL SHOULDER ARTHROPLASTY;  Surgeon: Netta Cedars, MD;  Location: [place];  Service: Orthopedics;  Laterality: Right;  . TUBAL LIGATION      There were no vitals filed for this visit.      Subjective Assessment - 09/06/16 1252    Subjective Patient reported doing well overall some ongoing soreness with certain movements, no pain per patient mainly soreness. To MD    Pertinent History HTN, OP, R THA, L TKA   Patient Stated Goals Use  right UE without pain.   Currently in Pain? Yes   Pain Score 1    Pain Location Shoulder   Pain Orientation Right   Pain Descriptors / Indicators Sore;Discomfort   Pain Type Surgical pain                         [facility] Adult PT Treatment/Exercise - 09/06/16 0001      Elbow Exercises   Elbow Flexion Strengthening;Right;Other reps (comment);Supine;Bar weights/barbell   Bar Weights/Barbell (Elbow Flexion) 3 lbs   Elbow Flexion Limitations x20     Shoulder Exercises: Prone   Retraction Strengthening;Right;Weights   Retraction Weight (lbs) 2   Retraction Limitations 3x10 reps   Extension Strengthening;Right;Weights  3x10   Extension Weight (lbs) 2     Shoulder Exercises: Standing   Protraction Strengthening;Right;Theraband  x30   Theraband Level (Shoulder Protraction) Level 2 (Red)   External Rotation Strengthening;Right;Theraband  x30   Theraband Level (Shoulder External Rotation) Level 2 (Red)   Internal Rotation Strengthening;Right;Theraband  x30   Theraband Level (Shoulder Internal Rotation) Level 2 (Red)   Extension Strengthening;Right;Theraband  x30   Theraband Level (Shoulder Extension) Level 2 (Red)     Shoulder Exercises: Pulleys   Flexion Other (comment)  35mn   Other  Pulley Exercises UE ranger in flex/abduct/circles x20 reps     Shoulder Exercises: ROM/Strengthening   Other ROM/Strengthening Exercises D1/D2 2# 2x10     Modalities   Modalities Electrical Stimulation;Vasopneumatic     Electrical Stimulation   Electrical Stimulation Location R shoulder premod x 15 mins 80-_0    Electrical Stimulation Goals Pain     Vasopneumatic   Number Minutes Vasopneumatic  15 minutes   Vasopnuematic Location  Shoulder   Vasopneumatic Pressure Low                     PT Long Term Goals - 09/06/16 1101      PT LONG TERM GOAL #1   Title Independent with a HEP.   Time 8   Period Weeks   Status Achieved     PT LONG TERM GOAL #2    Title Active right shoulder ER to 65 degrees+ to allow for easily donning/doffing of apparel.   Time 8   Period Weeks   Status Achieved     PT LONG TERM GOAL #3   Title Increase right shoulder strength to a solid 5/5 to increase stability for performance of functional activities   Time 8   Period Weeks   Status Partially Met     PT LONG TERM GOAL #4   Title Perform ADL's with right shoulder pain not > 2/10.   Time 8   Period Weeks   Status Achieved     PT LONG TERM GOAL #5   Title Increase R shoulder flex to 160 deg to perform OH ADLS and allow RTW.   Baseline 155 degrees 09-06-16   Time 8   Period Weeks   Status On-going               Plan - 09/06/16 1253    Clinical Impression Statement Pt arrived to clinic today doing fairly well. Her c/c was having some mm spasms at while at work, but meds have helped. She has met majority of goals except full elevation to 160 degree( 155 degrees) and  strength 5/5  NM (4/5) and are ongoing   Rehab Potential Excellent   Clinical Impairments Affecting Rehab Potential 06/17/13 surgery - current 10 weeks 08/26/16   PT Frequency 2x / week   PT Duration 8 weeks   PT Treatment/Interventions ADLs/Self Care Home Management;Cryotherapy;Electrical Stimulation;Moist Heat;Therapeutic exercise;Therapeutic activities;Manual techniques;Passive range of motion;Vasopneumatic Device;Neuromuscular re-education;Patient/family education   PT Next Visit Plan cont per TSA protocol (MD. Veverly Fells 09/08/16)  OR DC   Consulted and Agree with Plan of Care Patient      Patient will benefit from skilled therapeutic intervention in order to improve the following deficits and impairments:  Pain, Decreased strength, Decreased range of motion  Visit Diagnosis: Stiffness of right shoulder, not elsewhere classified  Acute pain of right shoulder     Problem List Patient Active Problem List   Diagnosis Date Noted  . Degenerative arthritis of right shoulder region  06/18/2016  . S/P shoulder replacement, right 06/17/2016  . Bursitis, hip 07/27/2015  . Lichen sclerosus of female genitalia 12/30/2013  . OA (osteoarthritis) of knee 09/16/2013  . GERD (gastroesophageal reflux disease) 07/11/2013  . GAD (generalized anxiety disorder) 07/11/2013  . Depression with anxiety 07/11/2013  . Hyperlipidemia with target LDL less than 100 07/11/2013  . Hypothyroidism 07/11/2013  . Essential hypertension 12/19/2008    Levonne Lapping, PTA 09/06/2016, 5:10 PM Mali Applegate MPT [facility] [place],  [place], [address] Phone: [phone]   Fax:  [phone]  Name: JALAINE RIGGENBACH MRN: [id number] Date of Birth: [birth year]  PHYSICAL THERAPY DISCHARGE SUMMARY  Visits from Start of Care: 14.  Current functional level related to goals / functional outcomes: See goals section.   Remaining deficits: See goal section.   Education / Equipment: HEP. Plan: Patient agrees to discharge.  Patient goals were partially met. Patient is being discharged due to being pleased with the current functional level.  ?????         Mali Applegate MPT

## 2016-09-08 DIAGNOSIS — Z96611 Presence of right artificial shoulder joint: Secondary | ICD-10-CM | POA: Diagnosis not present

## 2016-09-08 DIAGNOSIS — Z471 Aftercare following joint replacement surgery: Secondary | ICD-10-CM | POA: Diagnosis not present

## 2016-09-19 NOTE — Addendum Note (Signed)
Addendum  created 09/19/16 1748 by Annye Asa, MD   Sign clinical note

## 2016-09-27 ENCOUNTER — Other Ambulatory Visit: Payer: Self-pay | Admitting: Family Medicine

## 2016-11-17 DIAGNOSIS — S83241D Other tear of medial meniscus, current injury, right knee, subsequent encounter: Secondary | ICD-10-CM | POA: Diagnosis not present

## 2016-11-17 DIAGNOSIS — M84351D Stress fracture, right femur, subsequent encounter for fracture with routine healing: Secondary | ICD-10-CM | POA: Diagnosis not present

## 2016-11-17 DIAGNOSIS — M1711 Unilateral primary osteoarthritis, right knee: Secondary | ICD-10-CM | POA: Diagnosis not present

## 2016-11-18 ENCOUNTER — Other Ambulatory Visit: Payer: Self-pay | Admitting: Family Medicine

## 2016-12-19 ENCOUNTER — Other Ambulatory Visit: Payer: Self-pay | Admitting: Family Medicine

## 2016-12-20 DIAGNOSIS — M19011 Primary osteoarthritis, right shoulder: Secondary | ICD-10-CM | POA: Diagnosis not present

## 2016-12-27 ENCOUNTER — Other Ambulatory Visit: Payer: Self-pay | Admitting: Family Medicine

## 2016-12-28 DIAGNOSIS — M1711 Unilateral primary osteoarthritis, right knee: Secondary | ICD-10-CM | POA: Diagnosis not present

## 2017-01-04 DIAGNOSIS — M1711 Unilateral primary osteoarthritis, right knee: Secondary | ICD-10-CM | POA: Diagnosis not present

## 2017-01-09 ENCOUNTER — Other Ambulatory Visit: Payer: Self-pay | Admitting: Family Medicine

## 2017-01-11 DIAGNOSIS — M1711 Unilateral primary osteoarthritis, right knee: Secondary | ICD-10-CM | POA: Diagnosis not present

## 2017-02-06 ENCOUNTER — Encounter: Payer: Self-pay | Admitting: Family Medicine

## 2017-02-06 ENCOUNTER — Ambulatory Visit (INDEPENDENT_AMBULATORY_CARE_PROVIDER_SITE_OTHER): Payer: BLUE CROSS/BLUE SHIELD | Admitting: Family Medicine

## 2017-02-06 VITALS — BP 110/74 | HR 74 | Temp 98.2°F | Ht 64.0 in | Wt 185.0 lb

## 2017-02-06 DIAGNOSIS — M25561 Pain in right knee: Secondary | ICD-10-CM

## 2017-02-06 DIAGNOSIS — I10 Essential (primary) hypertension: Secondary | ICD-10-CM | POA: Diagnosis not present

## 2017-02-06 DIAGNOSIS — M1711 Unilateral primary osteoarthritis, right knee: Secondary | ICD-10-CM | POA: Diagnosis not present

## 2017-02-06 DIAGNOSIS — E039 Hypothyroidism, unspecified: Secondary | ICD-10-CM | POA: Diagnosis not present

## 2017-02-06 DIAGNOSIS — M19011 Primary osteoarthritis, right shoulder: Secondary | ICD-10-CM

## 2017-02-06 DIAGNOSIS — E785 Hyperlipidemia, unspecified: Secondary | ICD-10-CM

## 2017-02-06 DIAGNOSIS — Z0000 Encounter for general adult medical examination without abnormal findings: Secondary | ICD-10-CM

## 2017-02-06 DIAGNOSIS — Z0289 Encounter for other administrative examinations: Secondary | ICD-10-CM

## 2017-02-06 DIAGNOSIS — M7071 Other bursitis of hip, right hip: Secondary | ICD-10-CM

## 2017-02-06 HISTORY — DX: Encounter for other administrative examinations: Z02.89

## 2017-02-06 LAB — URINALYSIS
Bilirubin, UA: NEGATIVE
Glucose, UA: NEGATIVE
Ketones, UA: NEGATIVE
NITRITE UA: NEGATIVE
PH UA: 7 (ref 5.0–7.5)
Protein, UA: NEGATIVE
Specific Gravity, UA: 1.02 (ref 1.005–1.030)
Urobilinogen, Ur: 1 mg/dL (ref 0.2–1.0)

## 2017-02-06 MED ORDER — OMEPRAZOLE 40 MG PO CPDR: DELAYED_RELEASE_CAPSULE | ORAL | 1 refills | Status: DC

## 2017-02-06 MED ORDER — ROSUVASTATIN CALCIUM 20 MG PO TABS: 20.0000 mg | ORAL_TABLET | Freq: Every day | ORAL | 1 refills | Status: DC

## 2017-02-06 MED ORDER — BENAZEPRIL-HYDROCHLOROTHIAZIDE 20-25 MG PO TABS: 1.0000 | ORAL_TABLET | Freq: Every day | ORAL | 1 refills | Status: DC

## 2017-02-06 MED ORDER — AMLODIPINE BESYLATE 2.5 MG PO TABS: 2.5000 mg | ORAL_TABLET | Freq: Every day | ORAL | 1 refills | Status: DC

## 2017-02-06 MED ORDER — MELOXICAM 15 MG PO TABS: 15.0000 mg | ORAL_TABLET | Freq: Every day | ORAL | 0 refills | Status: DC

## 2017-02-06 MED ORDER — CLORAZEPATE DIPOTASSIUM 3.75 MG PO TABS
3.7500 mg | ORAL_TABLET | Freq: Two times a day (BID) | ORAL | 5 refills | Status: DC
Start: 2017-02-06 — End: 2017-03-13

## 2017-02-06 MED ORDER — DULOXETINE HCL 30 MG PO CPEP: 90.0000 mg | ORAL_CAPSULE | Freq: Every day | ORAL | 1 refills | Status: DC

## 2017-02-06 MED ORDER — HYDROCODONE-ACETAMINOPHEN 5-325 MG PO TABS: 1.0000 | ORAL_TABLET | Freq: Two times a day (BID) | ORAL | 0 refills | Status: DC | PRN

## 2017-02-06 NOTE — Progress Notes (Signed)
Subjective:  Patient ID: Leslie Robbins, female    DOB: [birth year]  Age: 61 y.o. MRN: [id number]  CC: Annual Exam (pt here today for CPE and also c/o right knee pain which she is scheduled to have knee replacement in Feb 2019.)   HPI [name] presents for annual physical exam.  She has 1 new complaint today.  The knee is causing a significant amount of pain and she would like to have something to give her relief.  She describes that pain is 9/10 dull ache waxing and waning.  She cannot take tramadol due to her use of SSRIs.  It is also infective he has taken some of her husband's hydrocodone and would like to try to take that.  She says currently she is taking about 4 a week and her pain is so intense that is unbearable.  Her schedule was surgery in about 2 months.  He is scheduled with Dr. Maureen Ralphs of [facility] Patient presents for follow-up on  thyroid. The patient has a history of hypothyroidism for many years. It has been stable recently. Pt. denies any change in  voice, loss of hair, heat or cold intolerance. Energy level has been adequate to good. Patient denies constipation and diarrhea. No myxedema. Medication is as noted below. Verified that pt is taking it daily on an empty stomach. Well tolerated.  Follow-up of hypertension. Patient has no history of headache chest pain or shortness of breath or recent cough. Patient also denies symptoms of TIA such as numbness weakness lateralizing. Patient checks  blood pressure at home and has not had any elevated readings recently. Patient denies side effects from his medication. States taking it regularly.   Depression screen [facility] 2/9 02/06/2017 08/12/2016 08/03/2016  Decreased Interest 0 1 0  Down, Depressed, Hopeless 0 0 0  PHQ - 2 Score 0 1 0  Altered sleeping - - -  Tired, decreased energy - - -  Change in appetite - - -  Feeling bad or failure about yourself  - - -  Trouble concentrating - - -  Moving slowly or fidgety/restless - -  -  Suicidal thoughts - - -  PHQ-9 Score - - -    History Leslie Robbins has a past medical history of Anxiety, Arthritis, Depression, GERD (gastroesophageal reflux disease), History of hiatal hernia, Hypercholesteremia, Hypertension, and Hypothyroidism.   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   Her family history includes Dementia in her mother; Depression in her sister; Fibromyalgia in her sister; Hypertension in her mother; Lupus in her mother.She reports that  has never smoked. she has never used smokeless tobacco. She reports that she does not drink alcohol or use drugs.    ROS Review of Systems  Constitutional: Negative for appetite change, chills, diaphoresis, fatigue, fever and unexpected weight change.  HENT: Negative for congestion, ear pain, hearing loss, postnasal drip, rhinorrhea, sneezing, sore throat and trouble swallowing.   Eyes: Negative for pain.  Respiratory: Negative for cough, chest tightness and shortness of breath.   Cardiovascular: Negative for chest pain and palpitations.  Gastrointestinal: Negative for abdominal pain, constipation, diarrhea, nausea and vomiting.  Endocrine: Negative for cold intolerance, heat intolerance, polydipsia, polyphagia and polyuria.  Genitourinary: Negative for dysuria, frequency and menstrual  problem.  Musculoskeletal: Positive for arthralgias (Left knee as per HPI). Negative for joint swelling.  Skin: Negative for rash.  Allergic/Immunologic: Negative for environmental allergies.  Neurological: Negative for dizziness, weakness, numbness and headaches.  Psychiatric/Behavioral: Negative for  agitation and dysphoric mood.    Objective:  BP 110/74   Pulse 74   Temp 98.2 F (36.8 C) (Oral)   Ht 5' 4" (1.626 m)   Wt 185 lb (83.9 kg)   BMI 31.76 kg/m   BP Readings from Last 3 Encounters:  02/06/17 110/74  08/12/16 111/81  08/03/16 110/72    Wt Readings from Last 3 Encounters:  02/06/17 185 lb (83.9 kg)  08/12/16 201 lb 8 oz (91.4 kg)  08/03/16 201 lb (91.2 kg)     Physical Exam  Constitutional: She is oriented to person, place, and time. She appears well-developed and well-nourished. No distress.  HENT:  Head: Normocephalic and atraumatic.  Right Ear: External ear normal.  Left Ear: External ear normal.  Nose: Nose normal.  Mouth/Throat: Oropharynx is clear and moist.  Eyes: Conjunctivae and EOM are normal. Pupils are equal, round, and reactive to light.  Neck: Normal range of motion. Neck supple. No thyromegaly present.  Cardiovascular: Normal rate, regular rhythm and normal heart sounds.  No murmur heard. Pulmonary/Chest: Effort normal and breath sounds normal. No respiratory distress. She has no wheezes. She has no rales. Right breast exhibits no inverted nipple, no mass and no tenderness. Left breast exhibits no inverted nipple, no mass and no tenderness. Breasts are symmetrical.  Abdominal: Soft. Normal appearance and bowel sounds are normal. She exhibits no distension, no abdominal bruit and no mass. There is no splenomegaly or hepatomegaly. There is no tenderness. There is no tenderness at McBurney's point and negative Murphy's sign.  Musculoskeletal: Normal range of motion. She exhibits no edema or tenderness.  Lymphadenopathy:    She has no cervical adenopathy.  Neurological: She is alert and oriented to person, place, and time. She has normal reflexes.  Skin: Skin is warm and dry. No rash noted.  Psychiatric: She has a normal mood and affect. Her behavior is normal. Judgment and thought content normal.      Assessment & Plan:   Audine was seen  today for annual exam.  Diagnoses and all orders for this visit:  Well adult exam  Essential hypertension -     CBC with Differential/Platelet -     CMP14+EGFR -     Urinalysis  Hypothyroidism, unspecified type -     TSH -     VITAMIN D 25 Hydroxy (Vit-D Deficiency, Fractures) -     T4, Free  Hyperlipidemia with target LDL less than 100 -     CBC with Differential/Platelet -     CMP14+EGFR -     Lipid panel  Primary osteoarthritis of right knee  Pain management contract signed  Other orders -     HYDROcodone-acetaminophen (NORCO/VICODIN) 5-325 MG tablet; Take 1 tablet by mouth 2 (two) times daily as needed for moderate pain. -     amLODipine (NORVASC) 2.5 MG tablet; Take 1 tablet (2.5 mg total) by mouth daily. -     benazepril-hydrochlorthiazide (LOTENSIN HCT) 20-25 MG tablet; Take 1 tablet by mouth daily. -     DULoxetine (CYMBALTA) 30 MG capsule; Take 3 capsules (90 mg total) by mouth daily. -     meloxicam (MOBIC) 15 MG tablet; Take 1 tablet (15 mg total) by mouth daily. -     omeprazole (  PRILOSEC) 40 MG capsule; TAKE 1 CAPSULE (40 MG TOTAL) BY MOUTH DAILY. -     rosuvastatin (CRESTOR) 20 MG tablet; Take 1 tablet (20 mg total) by mouth daily. -     clorazepate (TRANXENE) 3.75 MG tablet; Take 1 tablet (3.75 mg total) by mouth 2 (two) times daily.    Pain management contract was reviewed in detail including the drawbacks to using chronic pain meds such as dependence and addiction potential, side effects.  She was counseled she could not get pains medicines from any other source until she is released back to her orthopedist at the time of surgery.  She cannot share.  She would not get early refills even if her supply was stolen etc.   I have discontinued Kathrina W. Ferrer's Vitamin D, Fish Oil, (Menthol, Topical Analgesic, (BIOFREEZE EX)), oxyCODONE-acetaminophen, and methocarbamol. I have also changed her amLODipine and DULoxetine. Additionally, I am having her start on  HYDROcodone-acetaminophen. Lastly, I am having her maintain her RESTASIS MULTIDOSE, valACYclovir, aspirin EC, cetirizine, cyanocobalamin, levothyroxine, Acetaminophen (TYLENOL ARTHRITIS EXT RELIEF PO), benazepril-hydrochlorthiazide, meloxicam, omeprazole, rosuvastatin, and clorazepate. We will continue to administer cyanocobalamin.  Allergies as of 02/06/2017      Reactions   Sulfonamide Derivatives Rash   SULFONAMIDES INCREASE SENSITIVITY   TO SUNLIGHT > MORE LIKELY TO SUNBURN SUNBURN-LIKE REDNESS      Medication List        Accurate as of 02/06/17  5:33 PM. Always use your most recent med list.          amLODipine 2.5 MG tablet Commonly known as:  NORVASC Take 1 tablet (2.5 mg total) by mouth daily.   aspirin EC 81 MG tablet Take 81 mg by mouth daily. WILL STOP PRIOR TO PROCEDURE   benazepril-hydrochlorthiazide 20-25 MG tablet Commonly known as:  LOTENSIN HCT Take 1 tablet by mouth daily.   cetirizine 10 MG tablet Commonly known as:  ZYRTEC Take 10 mg by mouth daily as needed for allergies.   clorazepate 3.75 MG tablet Commonly known as:  TRANXENE Take 1 tablet (3.75 mg total) by mouth 2 (two) times daily.   cyanocobalamin 500 MCG tablet Take 500 mcg by mouth daily.   DULoxetine 30 MG capsule Commonly known as:  CYMBALTA Take 3 capsules (90 mg total) by mouth daily.   HYDROcodone-acetaminophen 5-325 MG tablet Commonly known as:  NORCO/VICODIN Take 1 tablet by mouth 2 (two) times daily as needed for moderate pain.   levothyroxine 112 MCG tablet Commonly known as:  SYNTHROID, LEVOTHROID TAKE 1 TABLET (112 MCG TOTAL) BY MOUTH DAILY.   meloxicam 15 MG tablet Commonly known as:  MOBIC Take 1 tablet (15 mg total) by mouth daily.   omeprazole 40 MG capsule Commonly known as:  PRILOSEC TAKE 1 CAPSULE (40 MG TOTAL) BY MOUTH DAILY.   RESTASIS MULTIDOSE 0.05 % ophthalmic emulsion Generic drug:  cycloSPORINE INSTILL 1 DROP INTO EACH EYE TWICE DAILY AS NEEDED FOR  IRRITATION   rosuvastatin 20 MG tablet Commonly known as:  CRESTOR Take 1 tablet (20 mg total) by mouth daily.   TYLENOL ARTHRITIS EXT RELIEF PO Take by mouth.   valACYclovir 1000 MG tablet Commonly known as:  VALTREX Take 1 tablet (1,000 mg total) by mouth daily.        Follow-up: No Follow-up on file.  Claretta Fraise, M.D.

## 2017-02-07 ENCOUNTER — Telehealth: Payer: Self-pay | Admitting: Family Medicine

## 2017-02-07 LAB — LIPID PANEL
Chol/HDL Ratio: 2.3 ratio (ref 0.0–4.4)
Cholesterol, Total: 117 mg/dL (ref 100–199)
HDL: 50 mg/dL (ref >39)
LDL Calculated: 46 mg/dL (ref 0–99)
TRIGLYCERIDES: 105 mg/dL (ref 0–149)
VLDL Cholesterol Cal: 21 mg/dL (ref 5–40)

## 2017-02-07 LAB — CMP14+EGFR
ALBUMIN: 4.6 g/dL (ref 3.6–4.8)
ALT: 27 IU/L (ref 0–32)
AST: 36 IU/L (ref 0–40)
Albumin/Globulin Ratio: 1.5 (ref 1.2–2.2)
Alkaline Phosphatase: 140 IU/L — ABNORMAL HIGH (ref 39–117)
BILIRUBIN TOTAL: 0.5 mg/dL (ref 0.0–1.2)
BUN/Creatinine Ratio: 19 (ref 12–28)
BUN: 22 mg/dL (ref 8–27)
CALCIUM: 10.5 mg/dL — AB (ref 8.7–10.3)
CHLORIDE: 102 mmol/L (ref 96–106)
CO2: 26 mmol/L (ref 20–29)
Creatinine, Ser: 1.15 mg/dL — ABNORMAL HIGH (ref 0.57–1.00)
GFR calc Af Amer: 59 mL/min/{1.73_m2} — ABNORMAL LOW (ref >59)
GFR calc non Af Amer: 51 mL/min/{1.73_m2} — ABNORMAL LOW (ref >59)
GLUCOSE: 90 mg/dL (ref 65–99)
Globulin, Total: 3.1 g/dL (ref 1.5–4.5)
POTASSIUM: 4.2 mmol/L (ref 3.5–5.2)
Sodium: 143 mmol/L (ref 134–144)
TOTAL PROTEIN: 7.7 g/dL (ref 6.0–8.5)

## 2017-02-07 LAB — CBC WITH DIFFERENTIAL/PLATELET
BASOS ABS: 0 10*3/uL (ref 0.0–0.2)
BASOS: 0 %
EOS (ABSOLUTE): 0.3 10*3/uL (ref 0.0–0.4)
Eos: 4 %
HEMOGLOBIN: 12.5 g/dL (ref 11.1–15.9)
Hematocrit: 39.1 % (ref 34.0–46.6)
IMMATURE GRANS (ABS): 0 10*3/uL (ref 0.0–0.1)
IMMATURE GRANULOCYTES: 0 %
LYMPHS: 29 %
Lymphocytes Absolute: 2 10*3/uL (ref 0.7–3.1)
MCH: 25.9 pg — AB (ref 26.6–33.0)
MCHC: 32 g/dL (ref 31.5–35.7)
MCV: 81 fL (ref 79–97)
MONOCYTES: 5 %
Monocytes Absolute: 0.4 10*3/uL (ref 0.1–0.9)
NEUTROS ABS: 4.3 10*3/uL (ref 1.4–7.0)
NEUTROS PCT: 62 %
PLATELETS: 269 10*3/uL (ref 150–379)
RBC: 4.83 x10E6/uL (ref 3.77–5.28)
RDW: 16.4 % — ABNORMAL HIGH (ref 12.3–15.4)
WBC: 7 10*3/uL (ref 3.4–10.8)

## 2017-02-07 LAB — VITAMIN D 25 HYDROXY (VIT D DEFICIENCY, FRACTURES): Vit D, 25-Hydroxy: 38.5 ng/mL (ref 30.0–100.0)

## 2017-02-07 LAB — T4, FREE: Free T4: 1.59 ng/dL (ref 0.82–1.77)

## 2017-02-07 LAB — TSH: TSH: 0.364 u[IU]/mL — AB (ref 0.450–4.500)

## 2017-02-08 NOTE — Telephone Encounter (Signed)
Patient aware of lab results.

## 2017-02-16 ENCOUNTER — Telehealth: Payer: Self-pay

## 2017-02-16 DIAGNOSIS — M1711 Unilateral primary osteoarthritis, right knee: Secondary | ICD-10-CM | POA: Diagnosis not present

## 2017-02-16 NOTE — Telephone Encounter (Signed)
Forward to PCP.

## 2017-02-16 NOTE — Telephone Encounter (Signed)
Dr Livia Snellen patient  Insurance denied prior auth for Hydrocodone Acet.  Members filling an immediate release opioid for the first time in 180 days are limited to a max. Of 7 day supply.  Over limit is not covered

## 2017-02-19 ENCOUNTER — Other Ambulatory Visit: Payer: Self-pay | Admitting: Family Medicine

## 2017-02-19 MED ORDER — HYDROCODONE-ACETAMINOPHEN 5-325 MG PO TABS: 1.0000 | ORAL_TABLET | Freq: Four times a day (QID) | ORAL | 0 refills | Status: DC | PRN

## 2017-02-19 NOTE — Telephone Encounter (Signed)
New scrip submitted. Please submit for prior auth - should meet criteria. Thanks, WS

## 2017-02-20 ENCOUNTER — Other Ambulatory Visit: Payer: Self-pay | Admitting: *Deleted

## 2017-02-20 NOTE — Telephone Encounter (Signed)
Patient paid cash and filled first script given her, therefore no new script for hydrocodone necessary.

## 2017-02-23 ENCOUNTER — Telehealth: Payer: Self-pay | Admitting: Family Medicine

## 2017-03-03 NOTE — Telephone Encounter (Signed)
Pt was calling to inform me that she has pain medication left and doesn't feel like she needs to come in on 1/4 for the knee pain since ortho is doing surgery on 04/11/17.  Appt on 1/4 cancelled and pt given appt with [place] on 03/29/17 to follow up and she will call back to cancel appt if she doesn't ntbs.

## 2017-03-10 ENCOUNTER — Ambulatory Visit: Payer: BLUE CROSS/BLUE SHIELD | Admitting: Family Medicine

## 2017-03-13 ENCOUNTER — Other Ambulatory Visit: Payer: Self-pay | Admitting: Family Medicine

## 2017-03-20 DIAGNOSIS — M1711 Unilateral primary osteoarthritis, right knee: Secondary | ICD-10-CM | POA: Diagnosis not present

## 2017-03-29 ENCOUNTER — Ambulatory Visit: Payer: BLUE CROSS/BLUE SHIELD | Admitting: Family Medicine

## 2017-04-06 DIAGNOSIS — M1711 Unilateral primary osteoarthritis, right knee: Secondary | ICD-10-CM | POA: Diagnosis not present

## 2017-04-07 ENCOUNTER — Telehealth: Payer: Self-pay | Admitting: Family Medicine

## 2017-04-07 NOTE — Telephone Encounter (Signed)
Pt states BP is dropping @ 2pm it 97/67. Pt is on 2 BP. Pt is on 2 BP meds. Pt is having Surgery 2/5. Pt is wanting to speak with nurse to see if she needs to go off her BP meds at least for a couple of days. Please advise.

## 2017-04-07 NOTE — Telephone Encounter (Signed)
Pt called - she is aware to stop the benazepril. She will monitor BP very closely over the weekend and call in MON with ehr readings - she is having surgery on Tues 2/5 and is nervous about the BP

## 2017-04-07 NOTE — Telephone Encounter (Signed)
Readings include ; 90/71 108/58 124/94 122/89 Mid day - seems to drop   On amlodipine 2.5 - takes 1 at bedtime And lotensin hct 20-25 1 in the morning  Surgery is scheduled for 2/5  Please address.Marland Kitchen

## 2017-04-07 NOTE — Telephone Encounter (Signed)
Please asked the patient to discontinue the benazepril.  She should continue the amlodipine.

## 2017-04-10 ENCOUNTER — Telehealth: Payer: Self-pay | Admitting: Family Medicine

## 2017-04-10 DIAGNOSIS — M1711 Unilateral primary osteoarthritis, right knee: Secondary | ICD-10-CM | POA: Diagnosis not present

## 2017-04-10 NOTE — Telephone Encounter (Signed)
Left message for patient to call.

## 2017-04-10 NOTE — Telephone Encounter (Signed)
Please advise  Concerning blood pressure readings.

## 2017-04-10 NOTE — Telephone Encounter (Signed)
Please ask her to resume the BP med. First dose today. Another in the morning before surgery unless told no by surgeon

## 2017-04-11 NOTE — Telephone Encounter (Signed)
Aware.  Keep appointment with provider on Wednesday.  Advised to sit for 15 minutes before taking blood pressure readings.

## 2017-04-12 ENCOUNTER — Ambulatory Visit: Payer: BLUE CROSS/BLUE SHIELD | Admitting: Family Medicine

## 2017-04-12 ENCOUNTER — Encounter: Payer: Self-pay | Admitting: Family Medicine

## 2017-04-12 VITALS — BP 132/87 | HR 74 | Temp 97.4°F | Ht 64.0 in | Wt 181.0 lb

## 2017-04-12 DIAGNOSIS — I10 Essential (primary) hypertension: Secondary | ICD-10-CM | POA: Diagnosis not present

## 2017-04-12 DIAGNOSIS — J320 Chronic maxillary sinusitis: Secondary | ICD-10-CM | POA: Diagnosis not present

## 2017-04-12 DIAGNOSIS — M1711 Unilateral primary osteoarthritis, right knee: Secondary | ICD-10-CM | POA: Diagnosis not present

## 2017-04-12 MED ORDER — BETAMETHASONE SOD PHOS & ACET 6 (3-3) MG/ML IJ SUSP: 6.0000 mg | Freq: Once | INTRAMUSCULAR | Status: DC

## 2017-04-12 MED ORDER — AMLODIPINE BESYLATE 5 MG PO TABS: 5.0000 mg | ORAL_TABLET | Freq: Every day | ORAL | 2 refills | Status: DC

## 2017-04-12 NOTE — Progress Notes (Signed)
Subjective:  Patient ID: Leslie Robbins, female    DOB: 07-27-1955  Age: 62 y.o. MRN: [id number]  CC: Blood Pressure Check (pt here today for BP check after stopping the Benazepril and is still taking Amlodipine and also c/o sinus pressure)   HPI Ben Sanz Mackins presents for  follow-up of hypertension. Patient has no history of headache chest pain or shortness of breath or recent cough. Patient also denies symptoms of TIA such as focal numbness or weakness. Patient checks  blood pressure at home. Readings recently were running rather low so she called in concerned that it was too low when her pressure dropped below 485 systolic.  By phone I advised her that she should hold the ACE inhibitor combination with diuretic.  She did so and the attached readings are noted to show that her pressure has rebounded and is actually a bit higher than desirable.  See attached log sheet.. Patient denies side effects from medication. States taking it regularly.  Her surgery was rescheduled for February 19.  This was due to an unrelated issue with her paperwork at surgery.  They could not find her surgical clearance physical.  That will be resubmitted if necessary.  She was to have right knee replacement.   History Leslie Robbins has a past medical history of Anxiety, Arthritis, Depression, GERD (gastroesophageal reflux disease), History of hiatal hernia, Hypercholesteremia, Hypertension, and Hypothyroidism.   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   Her family history includes Dementia in her mother; Depression in her sister; Fibromyalgia in her sister; Hypertension in her mother;  Lupus in her mother.She reports that  has never smoked. she has never used smokeless tobacco. She reports that she does not drink alcohol or use drugs.  Current Outpatient Medications on File Prior to Visit  Medication Sig Dispense Refill  . Acetaminophen (TYLENOL ARTHRITIS EXT RELIEF PO) Take by mouth.    Marland Kitchen aspirin EC 81 MG tablet Take 81 mg by mouth daily. WILL STOP PRIOR TO PROCEDURE    . cetirizine (ZYRTEC) 10 MG tablet Take 10 mg by mouth daily as needed for allergies.    . clorazepate (TRANXENE) 3.75 MG tablet TAKE 1 TABLET BY MOUTH TWICE A DAY 60 tablet 4  . cyanocobalamin 500 MCG tablet Take 500 mcg by mouth daily.    . DULoxetine (CYMBALTA) 30 MG capsule Take 3 capsules (90 mg total) by mouth daily. 270 capsule 1  . levothyroxine (SYNTHROID, LEVOTHROID) 112 MCG tablet TAKE 1 TABLET (112 MCG TOTAL) BY MOUTH DAILY. 30 tablet 10  . meloxicam (MOBIC) 15 MG tablet Take 1 tablet (15 mg total) by mouth daily. 90 tablet 0  . omeprazole (PRILOSEC) 40 MG capsule TAKE 1 CAPSULE (40 MG TOTAL) BY MOUTH DAILY. 90 capsule 1  . RESTASIS MULTIDOSE 0.05 % ophthalmic emulsion INSTILL 1 DROP INTO EACH EYE TWICE DAILY AS NEEDED FOR IRRITATION  3  . rosuvastatin (CRESTOR) 20 MG tablet Take 1 tablet (20 mg total) by mouth daily. 90 tablet 1  . benazepril-hydrochlorthiazide (LOTENSIN HCT) 20-25 MG tablet Take 1 tablet by mouth daily. (Patient not taking: Reported on 04/12/2017) 90 tablet 1   Current Facility-Administered Medications on File Prior to Visit  Medication Dose Route Frequency Provider Last Rate Last Dose  .  cyanocobalamin ((VITAMIN B-12)) injection 1,000 mcg  1,000 mcg Intramuscular Q30 days Claretta Fraise, MD   1,000 mcg at 04/29/16 1700    ROS Review of Systems  Constitutional: Negative for activity change, appetite change and fever.  HENT: Negative for congestion, rhinorrhea and sore throat.   Eyes: Negative for visual disturbance.  Respiratory: Negative for cough and shortness of breath.     Cardiovascular: Negative for chest pain and palpitations.  Gastrointestinal: Negative for abdominal pain, diarrhea and nausea.  Genitourinary: Negative for dysuria.  Musculoskeletal: Positive for arthralgias. Negative for myalgias.    Objective:  BP 132/87   Pulse 74   Temp (!) 97.4 F (36.3 C) (Oral)   Ht 5\' 4"  (1.626 m)   Wt 181 lb (82.1 kg)   BMI 31.07 kg/m   BP Readings from Last 3 Encounters:  04/12/17 132/87  02/06/17 110/74  08/12/16 111/81    Wt Readings from Last 3 Encounters:  04/12/17 181 lb (82.1 kg)  02/06/17 185 lb (83.9 kg)  08/12/16 201 lb 8 oz (91.4 kg)     Physical Exam  Constitutional: She is oriented to person, place, and time. She appears well-developed and well-nourished. No distress.  HENT:  Head: Normocephalic and atraumatic.  Right Ear: External ear normal.  Left Ear: External ear normal.  Nose: Nose normal.  Mouth/Throat: Oropharynx is clear and moist.  Eyes: Conjunctivae and EOM are normal. Pupils are equal, round, and reactive to light.  Neck: Normal range of motion. Neck supple. No thyromegaly present.  Cardiovascular: Normal rate, regular rhythm and normal heart sounds.  No murmur heard. Pulmonary/Chest: Effort normal and breath sounds normal. No respiratory distress. She has no wheezes. She has no rales.  Abdominal: Soft. Bowel sounds are normal. She exhibits no distension. There is no tenderness.  Lymphadenopathy:    She has no cervical adenopathy.  Neurological: She is alert and oriented to person, place, and time. She has normal reflexes.  Skin: Skin is warm and dry.  Psychiatric: She has a normal mood and affect. Her behavior is normal. Judgment and thought content normal.      Assessment & Plan:   Leslie Robbins was seen today for blood pressure check.  Diagnoses and all orders for this visit:  Chronic maxillary sinusitis -     betamethasone acetate-betamethasone sodium phosphate (CELESTONE) injection 6 mg  Essential  hypertension  Primary osteoarthritis of right knee  Other orders -     amLODipine (NORVASC) 5 MG tablet; Take 1 tablet (5 mg total) by mouth daily.   Allergies as of 04/12/2017      Reactions   Sulfonamide Derivatives Rash   SULFONAMIDES INCREASE SENSITIVITY   TO SUNLIGHT > MORE LIKELY TO SUNBURN SUNBURN-LIKE REDNESS      Medication List        Accurate as of 04/12/17  3:54 PM. Always use your most recent med list.          amLODipine 5 MG tablet Commonly known as:  NORVASC Take 1 tablet (5 mg total) by mouth daily.   aspirin EC 81 MG tablet Take 81 mg by mouth daily. WILL STOP PRIOR TO PROCEDURE   benazepril-hydrochlorthiazide 20-25 MG tablet Commonly known as:  LOTENSIN HCT Take 1 tablet by mouth daily.   cetirizine 10 MG tablet Commonly known as:  ZYRTEC Take 10 mg by mouth daily as needed for allergies.   clorazepate 3.75 MG tablet Commonly known as:  TRANXENE TAKE 1 TABLET BY MOUTH TWICE A DAY  cyanocobalamin 500 MCG tablet Take 500 mcg by mouth daily.   DULoxetine 30 MG capsule Commonly known as:  CYMBALTA Take 3 capsules (90 mg total) by mouth daily.   levothyroxine 112 MCG tablet Commonly known as:  SYNTHROID, LEVOTHROID TAKE 1 TABLET (112 MCG TOTAL) BY MOUTH DAILY.   meloxicam 15 MG tablet Commonly known as:  MOBIC Take 1 tablet (15 mg total) by mouth daily.   omeprazole 40 MG capsule Commonly known as:  PRILOSEC TAKE 1 CAPSULE (40 MG TOTAL) BY MOUTH DAILY.   RESTASIS MULTIDOSE 0.05 % ophthalmic emulsion Generic drug:  cycloSPORINE INSTILL 1 DROP INTO EACH EYE TWICE DAILY AS NEEDED FOR IRRITATION   rosuvastatin 20 MG tablet Commonly known as:  CRESTOR Take 1 tablet (20 mg total) by mouth daily.   TYLENOL ARTHRITIS EXT RELIEF PO Take by mouth.       Meds ordered this encounter  Medications  . betamethasone acetate-betamethasone sodium phosphate (CELESTONE) injection 6 mg  . amLODipine (NORVASC) 5 MG tablet    Sig: Take 1 tablet (5  mg total) by mouth daily.    Dispense:  30 tablet    Refill:  2    Increasing the amlodipine hopefully will bring her pressure back down into range without dropping too low and make it where she does not have to take 2 different medications.  She should continue monitoring her blood sugars at home on a regular basis.  If they do not respond she should let me know and will need to consider additional medication.  Follow-up: Return in about 3 months (around 07/10/2017), or if symptoms worsen or fail to improve.  Claretta Fraise, M.D.

## 2017-04-21 ENCOUNTER — Other Ambulatory Visit: Payer: BLUE CROSS/BLUE SHIELD

## 2017-04-21 DIAGNOSIS — M1711 Unilateral primary osteoarthritis, right knee: Secondary | ICD-10-CM | POA: Diagnosis not present

## 2017-04-25 DIAGNOSIS — M1711 Unilateral primary osteoarthritis, right knee: Secondary | ICD-10-CM | POA: Diagnosis not present

## 2017-04-25 DIAGNOSIS — Z96651 Presence of right artificial knee joint: Secondary | ICD-10-CM | POA: Diagnosis not present

## 2017-04-28 DIAGNOSIS — M1711 Unilateral primary osteoarthritis, right knee: Secondary | ICD-10-CM | POA: Diagnosis not present

## 2017-05-01 DIAGNOSIS — M1711 Unilateral primary osteoarthritis, right knee: Secondary | ICD-10-CM | POA: Diagnosis not present

## 2017-05-03 DIAGNOSIS — M1711 Unilateral primary osteoarthritis, right knee: Secondary | ICD-10-CM | POA: Diagnosis not present

## 2017-05-05 DIAGNOSIS — M1711 Unilateral primary osteoarthritis, right knee: Secondary | ICD-10-CM | POA: Diagnosis not present

## 2017-05-08 DIAGNOSIS — M1711 Unilateral primary osteoarthritis, right knee: Secondary | ICD-10-CM | POA: Diagnosis not present

## 2017-05-09 DIAGNOSIS — Z96651 Presence of right artificial knee joint: Secondary | ICD-10-CM | POA: Insufficient documentation

## 2017-05-10 DIAGNOSIS — M1711 Unilateral primary osteoarthritis, right knee: Secondary | ICD-10-CM | POA: Diagnosis not present

## 2017-05-12 DIAGNOSIS — M1711 Unilateral primary osteoarthritis, right knee: Secondary | ICD-10-CM | POA: Diagnosis not present

## 2017-05-14 ENCOUNTER — Other Ambulatory Visit: Payer: Self-pay | Admitting: Family Medicine

## 2017-05-15 DIAGNOSIS — M1711 Unilateral primary osteoarthritis, right knee: Secondary | ICD-10-CM | POA: Diagnosis not present

## 2017-05-17 DIAGNOSIS — M1711 Unilateral primary osteoarthritis, right knee: Secondary | ICD-10-CM | POA: Diagnosis not present

## 2017-05-18 DIAGNOSIS — H5210 Myopia, unspecified eye: Secondary | ICD-10-CM | POA: Diagnosis not present

## 2017-05-18 DIAGNOSIS — H40033 Anatomical narrow angle, bilateral: Secondary | ICD-10-CM | POA: Diagnosis not present

## 2017-05-18 DIAGNOSIS — H04123 Dry eye syndrome of bilateral lacrimal glands: Secondary | ICD-10-CM | POA: Diagnosis not present

## 2017-05-19 DIAGNOSIS — M1711 Unilateral primary osteoarthritis, right knee: Secondary | ICD-10-CM | POA: Diagnosis not present

## 2017-05-22 DIAGNOSIS — M1711 Unilateral primary osteoarthritis, right knee: Secondary | ICD-10-CM | POA: Diagnosis not present

## 2017-05-26 DIAGNOSIS — M1711 Unilateral primary osteoarthritis, right knee: Secondary | ICD-10-CM | POA: Diagnosis not present

## 2017-05-30 DIAGNOSIS — Z471 Aftercare following joint replacement surgery: Secondary | ICD-10-CM | POA: Diagnosis not present

## 2017-05-30 DIAGNOSIS — Z96651 Presence of right artificial knee joint: Secondary | ICD-10-CM | POA: Diagnosis not present

## 2017-07-08 ENCOUNTER — Other Ambulatory Visit: Payer: Self-pay | Admitting: Family Medicine

## 2017-07-11 ENCOUNTER — Ambulatory Visit: Payer: BLUE CROSS/BLUE SHIELD | Admitting: Family Medicine

## 2017-07-11 ENCOUNTER — Encounter: Payer: Self-pay | Admitting: Family Medicine

## 2017-07-11 VITALS — BP 137/83 | HR 67 | Temp 96.5°F | Ht 64.0 in | Wt 191.0 lb

## 2017-07-11 DIAGNOSIS — E039 Hypothyroidism, unspecified: Secondary | ICD-10-CM | POA: Diagnosis not present

## 2017-07-11 DIAGNOSIS — E785 Hyperlipidemia, unspecified: Secondary | ICD-10-CM

## 2017-07-11 DIAGNOSIS — I10 Essential (primary) hypertension: Secondary | ICD-10-CM | POA: Diagnosis not present

## 2017-07-11 DIAGNOSIS — F418 Other specified anxiety disorders: Secondary | ICD-10-CM | POA: Diagnosis not present

## 2017-07-11 DIAGNOSIS — M19011 Primary osteoarthritis, right shoulder: Secondary | ICD-10-CM | POA: Diagnosis not present

## 2017-07-11 DIAGNOSIS — M1712 Unilateral primary osteoarthritis, left knee: Secondary | ICD-10-CM | POA: Diagnosis not present

## 2017-07-11 DIAGNOSIS — F411 Generalized anxiety disorder: Secondary | ICD-10-CM

## 2017-07-11 MED ORDER — LEVOTHYROXINE SODIUM 112 MCG PO TABS: ORAL_TABLET | ORAL | 1 refills | Status: DC

## 2017-07-11 MED ORDER — AMLODIPINE BESYLATE 5 MG PO TABS: 5.0000 mg | ORAL_TABLET | Freq: Every day | ORAL | 3 refills | Status: DC

## 2017-07-11 MED ORDER — DULOXETINE HCL 30 MG PO CPEP: 90.0000 mg | ORAL_CAPSULE | Freq: Every day | ORAL | 1 refills | Status: DC

## 2017-07-11 MED ORDER — OMEPRAZOLE 40 MG PO CPDR: DELAYED_RELEASE_CAPSULE | ORAL | 1 refills | Status: DC

## 2017-07-11 MED ORDER — ROSUVASTATIN CALCIUM 20 MG PO TABS: 20.0000 mg | ORAL_TABLET | Freq: Every day | ORAL | 1 refills | Status: DC

## 2017-07-11 NOTE — Progress Notes (Signed)
Subjective:  Patient ID: Leslie Robbins, female    DOB: 1955/08/24  Age: 62 y.o. MRN: [id number]  CC: Hypertension (pt here today for routine follow up of her chronic medical conditions)   HPI Leslie Robbins presents for  follow-up of hypertension. Patient has no history of headache chest pain or shortness of breath or recent cough. Patient also denies symptoms of TIA such as focal numbness or weakness. Patient denies side effects from medication. States taking it regularly.  She continues to take meloxicam for the sake of arthritis pain.  She is had significant relief with her recent knees replacement surgery but is now having more shoulder pain seems to give him adequate relief.  Patient in for follow-up of elevated cholesterol. Doing well without complaints on current medication. Denies side effects of statin including myalgia and arthralgia and nausea. Also in today for liver function testing. Currently no chest pain, shortness of breath or other cardiovascular related symptoms noted.  Follow-up patient presents for follow-up on  thyroid. The patient has a history of hypothyroidism for many years. It has been stable recently. Pt. denies any change in  voice, loss of hair, heat or cold intolerance. Energy level has been adequate to good. Patient denies constipation and diarrhea. No myxedema. Medication is as noted below. Verified that pt is taking it daily on an empty stomach. Well tolerated.   History Leslie Robbins has a past medical history of Anxiety, Arthritis, Depression, GERD (gastroesophageal reflux disease), History of hiatal hernia, Hypercholesteremia, Hypertension, and Hypothyroidism.   She has a past surgical history that includes Abdominal hysterectomy; Tubal ligation; Right Carpal Tunnel Release; Breast lumpectomy; Colonoscopy (N/A, 05/30/2012); Dilation and curettage of uterus; Cardiac catheterization; Total knee arthroplasty (Left, 09/16/2013); Joint replacement (Right); Total shoulder  arthroplasty (Right, 06/17/2016); and Total shoulder arthroplasty (Right, 06/17/2016).   Her family history includes Dementia in her mother; Depression in her sister; Fibromyalgia in her sister; Hypertension in her mother; Lupus in her mother.She reports that she has never smoked. She has never used smokeless tobacco. She reports that she does not drink alcohol or use drugs.  Current Outpatient Medications on File Prior to Visit  Medication Sig Dispense Refill  . Acetaminophen (TYLENOL ARTHRITIS EXT RELIEF PO) Take by mouth.    Marland Kitchen aspirin EC 81 MG tablet Take 81 mg by mouth daily. WILL STOP PRIOR TO PROCEDURE    . cetirizine (ZYRTEC) 10 MG tablet Take 10 mg by mouth daily as needed for allergies.    . cholecalciferol (VITAMIN D) 1000 units tablet Take 2,000 Units by mouth 3 (three) times a week.    . clorazepate (TRANXENE) 3.75 MG tablet TAKE 1 TABLET BY MOUTH TWICE A DAY 60 tablet 4  . cyanocobalamin 500 MCG tablet Take 500 mcg by mouth daily.    . meloxicam (MOBIC) 15 MG tablet Take 1 tablet (15 mg total) by mouth daily. 90 tablet 0  . RESTASIS MULTIDOSE 0.05 % ophthalmic emulsion INSTILL 1 DROP INTO EACH EYE TWICE DAILY AS NEEDED FOR IRRITATION  3   No current facility-administered medications on file prior to visit.     ROS Review of Systems  Constitutional: Negative.   HENT: Negative for congestion.   Eyes: Negative for visual disturbance.  Respiratory: Negative for shortness of breath.   Cardiovascular: Negative for chest pain.  Gastrointestinal: Negative for abdominal pain, constipation, diarrhea, nausea and vomiting.  Genitourinary: Negative for difficulty urinating.  Musculoskeletal: Negative for arthralgias and myalgias.  Neurological: Negative for headaches.  Psychiatric/Behavioral:  Negative for sleep disturbance.    Objective:  BP 137/83   Pulse 67   Temp (!) 96.5 F (35.8 C) (Oral)   Ht '5\' 4"'$  (1.626 m)   Wt 191 lb (86.6 kg)   BMI 32.79 kg/m   BP Readings from Last  3 Encounters:  07/11/17 137/83  04/12/17 132/87  02/06/17 110/74    Wt Readings from Last 3 Encounters:  07/11/17 191 lb (86.6 kg)  04/12/17 181 lb (82.1 kg)  02/06/17 185 lb (83.9 kg)     Physical Exam  Constitutional: She is oriented to person, place, and time. She appears well-developed and well-nourished. No distress.  HENT:  Head: Normocephalic and atraumatic.  Eyes: Pupils are equal, round, and reactive to light. Conjunctivae are normal.  Neck: Normal range of motion. Neck supple. No thyromegaly present.  Cardiovascular: Normal rate, regular rhythm and normal heart sounds.  No murmur heard. Pulmonary/Chest: Effort normal and breath sounds normal. No respiratory distress. She has no wheezes. She has no rales.  Abdominal: Soft. Bowel sounds are normal. She exhibits no distension. There is no tenderness.  Musculoskeletal: Normal range of motion.  Lymphadenopathy:    She has no cervical adenopathy.  Neurological: She is alert and oriented to person, place, and time.  Skin: Skin is warm and dry.  Psychiatric: She has a normal mood and affect. Her behavior is normal. Judgment and thought content normal.      Assessment & Plan:   Leslie Robbins was seen today for hypertension.  Diagnoses and all orders for this visit:  Hypothyroidism, unspecified type -     CMP14+EGFR -     TSH -     T4, Free -     levothyroxine (SYNTHROID, LEVOTHROID) 112 MCG tablet; TAKE 1 TABLET (112 MCG TOTAL) BY MOUTH DAILY.  Essential hypertension -     CMP14+EGFR -     amLODipine (NORVASC) 5 MG tablet; Take 1 tablet (5 mg total) by mouth daily. -     DME Other see comment  Primary osteoarthritis of right shoulder  GAD (generalized anxiety disorder) -     DULoxetine (CYMBALTA) 30 MG capsule; Take 3 capsules (90 mg total) by mouth daily.  Depression with anxiety -     DULoxetine (CYMBALTA) 30 MG capsule; Take 3 capsules (90 mg total) by mouth daily.  Hyperlipidemia with target LDL less than 100 -      CBC with Differential/Platelet -     CMP14+EGFR -     rosuvastatin (CRESTOR) 20 MG tablet; Take 1 tablet (20 mg total) by mouth daily.  Primary osteoarthritis of left knee  Other orders -     omeprazole (PRILOSEC) 40 MG capsule; TAKE 1 CAPSULE (40 MG TOTAL) BY MOUTH DAILY.   Allergies as of 07/11/2017      Reactions   Sulfonamide Derivatives Rash   SULFONAMIDES INCREASE SENSITIVITY   TO SUNLIGHT > MORE LIKELY TO SUNBURN SUNBURN-LIKE REDNESS      Medication List        Accurate as of 07/11/17 11:59 PM. Always use your most recent med list.          amLODipine 5 MG tablet Commonly known as:  NORVASC Take 1 tablet (5 mg total) by mouth daily.   aspirin EC 81 MG tablet Take 81 mg by mouth daily. WILL STOP PRIOR TO PROCEDURE   cetirizine 10 MG tablet Commonly known as:  ZYRTEC Take 10 mg by mouth daily as needed for allergies.   cholecalciferol 1000 units  tablet Commonly known as:  VITAMIN D Take 2,000 Units by mouth 3 (three) times a week.   clorazepate 3.75 MG tablet Commonly known as:  TRANXENE TAKE 1 TABLET BY MOUTH TWICE A DAY   DULoxetine 30 MG capsule Commonly known as:  CYMBALTA Take 3 capsules (90 mg total) by mouth daily.   levothyroxine 112 MCG tablet Commonly known as:  SYNTHROID, LEVOTHROID TAKE 1 TABLET (112 MCG TOTAL) BY MOUTH DAILY.   meloxicam 15 MG tablet Commonly known as:  MOBIC Take 1 tablet (15 mg total) by mouth daily.   omeprazole 40 MG capsule Commonly known as:  PRILOSEC TAKE 1 CAPSULE (40 MG TOTAL) BY MOUTH DAILY.   RESTASIS MULTIDOSE 0.05 % ophthalmic emulsion Generic drug:  cycloSPORINE INSTILL 1 DROP INTO EACH EYE TWICE DAILY AS NEEDED FOR IRRITATION   rosuvastatin 20 MG tablet Commonly known as:  CRESTOR Take 1 tablet (20 mg total) by mouth daily.   TYLENOL ARTHRITIS EXT RELIEF PO Take by mouth.   vitamin B-12 500 MCG tablet Commonly known as:  CYANOCOBALAMIN Take 500 mcg by mouth daily.            Durable  Medical Equipment  (From admission, onward)        Start     Ordered   07/11/17 0000  DME Other see comment    Comments:  Blood Pressure Monitor Dx I10   07/11/17 1047      Meds ordered this encounter  Medications  . amLODipine (NORVASC) 5 MG tablet    Sig: Take 1 tablet (5 mg total) by mouth daily.    Dispense:  90 tablet    Refill:  3  . DULoxetine (CYMBALTA) 30 MG capsule    Sig: Take 3 capsules (90 mg total) by mouth daily.    Dispense:  270 capsule    Refill:  1  . levothyroxine (SYNTHROID, LEVOTHROID) 112 MCG tablet    Sig: TAKE 1 TABLET (112 MCG TOTAL) BY MOUTH DAILY.    Dispense:  90 tablet    Refill:  1  . omeprazole (PRILOSEC) 40 MG capsule    Sig: TAKE 1 CAPSULE (40 MG TOTAL) BY MOUTH DAILY.    Dispense:  90 capsule    Refill:  1  . rosuvastatin (CRESTOR) 20 MG tablet    Sig: Take 1 tablet (20 mg total) by mouth daily.    Dispense:  90 tablet    Refill:  1      Follow-up: Return in about 6 months (around 01/11/2018).  Claretta Fraise, M.D.

## 2017-07-12 ENCOUNTER — Encounter: Payer: Self-pay | Admitting: Family Medicine

## 2017-07-12 LAB — CBC WITH DIFFERENTIAL/PLATELET
BASOS ABS: 0 10*3/uL (ref 0.0–0.2)
Basos: 1 %
EOS (ABSOLUTE): 0.2 10*3/uL (ref 0.0–0.4)
Eos: 5 %
HEMOGLOBIN: 11.7 g/dL (ref 11.1–15.9)
Hematocrit: 37.5 % (ref 34.0–46.6)
Immature Grans (Abs): 0 10*3/uL (ref 0.0–0.1)
Immature Granulocytes: 0 %
LYMPHS ABS: 1.2 10*3/uL (ref 0.7–3.1)
Lymphs: 28 %
MCH: 25.8 pg — AB (ref 26.6–33.0)
MCHC: 31.2 g/dL — AB (ref 31.5–35.7)
MCV: 83 fL (ref 79–97)
Monocytes Absolute: 0.2 10*3/uL (ref 0.1–0.9)
Monocytes: 5 %
NEUTROS ABS: 2.5 10*3/uL (ref 1.4–7.0)
Neutrophils: 61 %
PLATELETS: 260 10*3/uL (ref 150–379)
RBC: 4.53 x10E6/uL (ref 3.77–5.28)
RDW: 14.8 % (ref 12.3–15.4)
WBC: 4.1 10*3/uL (ref 3.4–10.8)

## 2017-07-12 LAB — CMP14+EGFR
ALBUMIN: 4.4 g/dL (ref 3.6–4.8)
ALT: 19 IU/L (ref 0–32)
AST: 27 IU/L (ref 0–40)
Albumin/Globulin Ratio: 1.5 (ref 1.2–2.2)
Alkaline Phosphatase: 159 IU/L — ABNORMAL HIGH (ref 39–117)
BILIRUBIN TOTAL: 0.5 mg/dL (ref 0.0–1.2)
BUN / CREAT RATIO: 18 (ref 12–28)
BUN: 20 mg/dL (ref 8–27)
CHLORIDE: 102 mmol/L (ref 96–106)
CO2: 24 mmol/L (ref 20–29)
Calcium: 10.3 mg/dL (ref 8.7–10.3)
Creatinine, Ser: 1.13 mg/dL — ABNORMAL HIGH (ref 0.57–1.00)
GFR calc Af Amer: 59 mL/min/{1.73_m2} — ABNORMAL LOW (ref >59)
GFR calc non Af Amer: 51 mL/min/{1.73_m2} — ABNORMAL LOW (ref >59)
GLUCOSE: 86 mg/dL (ref 65–99)
Globulin, Total: 2.9 g/dL (ref 1.5–4.5)
POTASSIUM: 4.6 mmol/L (ref 3.5–5.2)
Sodium: 141 mmol/L (ref 134–144)
Total Protein: 7.3 g/dL (ref 6.0–8.5)

## 2017-07-12 LAB — TSH: TSH: 0.335 u[IU]/mL — ABNORMAL LOW (ref 0.450–4.500)

## 2017-07-12 LAB — T4, FREE: Free T4: 1.5 ng/dL (ref 0.82–1.77)

## 2017-07-31 ENCOUNTER — Other Ambulatory Visit: Payer: Self-pay | Admitting: Family Medicine

## 2017-08-24 ENCOUNTER — Other Ambulatory Visit: Payer: Self-pay | Admitting: Physician Assistant

## 2017-08-24 ENCOUNTER — Ambulatory Visit: Payer: BLUE CROSS/BLUE SHIELD | Admitting: Family Medicine

## 2017-08-24 ENCOUNTER — Encounter: Payer: Self-pay | Admitting: Family Medicine

## 2017-08-24 VITALS — BP 134/90 | HR 88 | Temp 98.2°F | Ht 64.0 in | Wt 183.2 lb

## 2017-08-24 DIAGNOSIS — F411 Generalized anxiety disorder: Secondary | ICD-10-CM | POA: Diagnosis not present

## 2017-08-24 MED ORDER — CLORAZEPATE DIPOTASSIUM 7.5 MG PO TABS: 7.5000 mg | ORAL_TABLET | Freq: Two times a day (BID) | ORAL | 1 refills | Status: DC

## 2017-08-24 NOTE — Patient Instructions (Signed)
Great to meet you! 

## 2017-08-24 NOTE — Progress Notes (Signed)
   HPI  Patient presents today with anxiety.  Patient states that she has had very difficult time with her family recently.  She has a daughter and son-in-law who are only using heroin.  Social services stepped in this week and remove the children from the home, they are now in the care of the patient.  The children range in age from 39 weeks to 64 years old, there are 3 of them. Patient is very concerned about her daughter, she is worried that she will die. She states that Cymbalta has worked well over the last year or so and that her anxiety has been under pretty good control.  She is used clorazepate occasionally as needed for anxiety.  She states that currently the pill is helping but only helps for short time and then wears off. She is not sleeping well.  PMH: Smoking status noted ROS: Per HPI  Objective: BP 134/90   Pulse 88   Temp 98.2 F (36.8 C) (Oral)   Ht 5\' 4"  (1.626 m)   Wt 183 lb 3.2 oz (83.1 kg)   BMI 31.45 kg/m  Gen: NAD, alert, cooperative with exam HEENT: NCAT CV: RRR, good S1/S2, no murmur Resp: CTABL, no wheezes, non-labored Ext: No edema, warm Neuro: Alert and oriented, No gross deficits  Depression screen [facility] 2/9 08/24/2017 07/11/2017 04/12/2017 02/06/2017 08/12/2016  Decreased Interest 2 0 1 0 1  Down, Depressed, Hopeless 2 0 0 0 0  PHQ - 2 Score 4 0 1 0 1  Altered sleeping 2 - - - -  Tired, decreased energy 2 - - - -  Change in appetite 2 - - - -  Feeling bad or failure about yourself  0 - - - -  Trouble concentrating 0 - - - -  Moving slowly or fidgety/restless 2 - - - -  Suicidal thoughts 0 - - - -  PHQ-9 Score 12 - - - -   GAD 7 : Generalized Anxiety Score 08/24/2017 10/07/2015 07/27/2015 01/23/2015  Nervous, Anxious, on Edge 3 1 2 1   Control/stop worrying 3 1 2 1   Worry too much - different things 3 1 2 1   Trouble relaxing 3 0 1 0  Restless 3 0 1 1  Easily annoyed or irritable 2 1 2 2   Afraid - awful might happen 3 1 1  0  Total GAD 7 Score 20 5 11  6   Anxiety Difficulty Not difficult at all Not difficult at all Not difficult at all Not difficult at all      Assessment and plan:  #GAD Exacerbated due to her social situation, this is a very difficult time. Continue Cymbalta, she has had very good response to this over several months. I have adjusted her clorazepate dose today, she is previously only taking it as needed. Recommend follow-up with PCP in 4 to 6 weeks, I have seen her today as he is out for the day.    Meds ordered this encounter  Medications  . clorazepate (TRANXENE) 7.5 MG tablet    Sig: Take 1 tablet (7.5 mg total) by mouth 2 (two) times daily.    Dispense:  60 tablet    Refill:  1    This request is for a new prescription for a controlled substance as required by Federal/State law.    Laroy Apple, MD [facility] 08/24/2017, 11:41 AM

## 2017-09-26 ENCOUNTER — Encounter: Payer: Self-pay | Admitting: Family Medicine

## 2017-09-26 ENCOUNTER — Ambulatory Visit: Payer: BLUE CROSS/BLUE SHIELD | Admitting: Family Medicine

## 2017-09-26 VITALS — BP 126/85 | HR 75 | Temp 97.1°F | Ht 64.0 in | Wt 182.1 lb

## 2017-09-26 DIAGNOSIS — F418 Other specified anxiety disorders: Secondary | ICD-10-CM | POA: Diagnosis not present

## 2017-09-26 DIAGNOSIS — I10 Essential (primary) hypertension: Secondary | ICD-10-CM | POA: Diagnosis not present

## 2017-09-26 DIAGNOSIS — R609 Edema, unspecified: Secondary | ICD-10-CM | POA: Diagnosis not present

## 2017-09-26 DIAGNOSIS — F411 Generalized anxiety disorder: Secondary | ICD-10-CM

## 2017-09-26 MED ORDER — TRIAMTERENE-HCTZ 37.5-25 MG PO TABS: 1.0000 | ORAL_TABLET | Freq: Every day | ORAL | 2 refills | Status: DC

## 2017-09-26 MED ORDER — DULOXETINE HCL 60 MG PO CPEP: 60.0000 mg | ORAL_CAPSULE | Freq: Two times a day (BID) | ORAL | 2 refills | Status: DC

## 2017-09-26 NOTE — Progress Notes (Signed)
Chief Complaint  Patient presents with  . Anxiety    pt here today following up on her anxiety after seeing Dr Wendi Snipes 08/24/17   . Edema    pt also c/o feet swelling    HPI  Patient presents today for follow-up on her anxiety.  She had seen Dr. Wendi Snipes in my absence 1 month ago and he had increased her Tranxene.  This was because of extreme anxiety brought on by her daughter and son-in-law losing their children due to heroin addiction and evaluation by social services.  They are in Ms. Grismore's custody at this time.  Patient feels chest tightness and near panic at times that leads to her taking a Tranxene.  However it seems to be lessening somewhat but she is anxious regularly.  She is also having some swelling in her feet.  She is on her feet quite frequently especially through her work and do caring for her home especially the children that are now in the home.  They are a bit better today but due to circumstances she has not had a be on her feet quite as much.  PMH: Smoking status noted ROS: Per HPI  Objective: BP 126/85   Pulse 75   Temp (!) 97.1 F (36.2 C) (Oral)   Ht 5\' 4"  (1.626 m)   Wt 182 lb 2 oz (82.6 kg)   BMI 31.26 kg/m  Gen: NAD, alert, cooperative with exam HEENT: NCAT, EOMI, PERRL CV: RRR, good S1/S2, no murmur Resp: CTABL, no wheezes, non-labored Abd: SNTND, BS present, no guarding or organomegaly Ext: No edema, warm Neuro: Alert and oriented, No gross deficits  Assessment and plan:  1. Depression with anxiety   2. GAD (generalized anxiety disorder)   3. Essential hypertension   4. Dependent edema     Meds ordered this encounter  Medications  . triamterene-hydrochlorothiazide (MAXZIDE-25) 37.5-25 MG tablet    Sig: Take 1 tablet by mouth daily. As needed for swelling    Dispense:  30 tablet    Refill:  2  . DULoxetine (CYMBALTA) 60 MG capsule    Sig: Take 1 capsule (60 mg total) by mouth 2 (two) times daily.    Dispense:  60 capsule    Refill:  2     No orders of the defined types were placed in this encounter.   Follow up as needed.  Claretta Fraise, MD

## 2017-09-27 ENCOUNTER — Ambulatory Visit: Payer: BLUE CROSS/BLUE SHIELD | Admitting: Family Medicine

## 2017-10-18 ENCOUNTER — Other Ambulatory Visit: Payer: Self-pay | Admitting: Family Medicine

## 2017-11-07 ENCOUNTER — Ambulatory Visit: Payer: BLUE CROSS/BLUE SHIELD | Admitting: Family Medicine

## 2017-11-07 ENCOUNTER — Encounter: Payer: Self-pay | Admitting: Family Medicine

## 2017-11-07 VITALS — Temp 97.3°F | Ht 64.0 in | Wt 178.2 lb

## 2017-11-07 DIAGNOSIS — F418 Other specified anxiety disorders: Secondary | ICD-10-CM

## 2017-11-07 DIAGNOSIS — R399 Unspecified symptoms and signs involving the genitourinary system: Secondary | ICD-10-CM

## 2017-11-07 LAB — MICROSCOPIC EXAMINATION
BACTERIA UA: NONE SEEN
RENAL EPITHEL UA: NONE SEEN /HPF

## 2017-11-07 LAB — URINALYSIS, COMPLETE
Bilirubin, UA: NEGATIVE
GLUCOSE, UA: NEGATIVE
KETONES UA: NEGATIVE
LEUKOCYTES UA: NEGATIVE
Nitrite, UA: NEGATIVE
Urobilinogen, Ur: 0.2 mg/dL (ref 0.2–1.0)
pH, UA: 5.5 (ref 5.0–7.5)

## 2017-11-07 MED ORDER — DULOXETINE HCL 60 MG PO CPEP: 60.0000 mg | ORAL_CAPSULE | Freq: Two times a day (BID) | ORAL | 0 refills | Status: DC

## 2017-11-07 MED ORDER — CLORAZEPATE DIPOTASSIUM 7.5 MG PO TABS: 7.5000 mg | ORAL_TABLET | Freq: Every day | ORAL | 2 refills | Status: DC

## 2017-11-07 NOTE — Progress Notes (Signed)
Subjective:  Patient ID: Leslie Robbins, female    DOB: [birth year]  Age: 62 y.o. MRN: [id number]  CC: Depression (6 week recheck) and Anxiety (6 week recheck)   HPI Leslie Robbins presents for recheck of depression. She has been caring for 3 young grandchildren since her daughter became addicted to heroin. They are delightful, but a lot for her to handle.  She has been stressed by the circumstances but feeling much better with the medication. She has been in tears frequently. Tranxene taken once daily has helped with this but she feels drugged if she takes it BID. PHQ noted below.  Depression screen [facility] 2/9 11/07/2017 09/26/2017 08/24/2017  Decreased Interest 1 1 2   Down, Depressed, Hopeless 1 1 2   PHQ - 2 Score 2 2 4   Altered sleeping 0 0 2  Tired, decreased energy 2 2 2   Change in appetite 1 1 2   Feeling bad or failure about yourself  0 0 0  Trouble concentrating 1 1 0  Moving slowly or fidgety/restless 1 1 2   Suicidal thoughts 0 0 0  PHQ-9 Score 7 7 12   Difficult doing work/chores Somewhat difficult - -  Some recent data might be hidden    History Leslie Robbins has a past medical history of Anxiety, Arthritis, Depression, GERD (gastroesophageal reflux disease), History of hiatal hernia, Hypercholesteremia, Hypertension, and Hypothyroidism.   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   Her family history includes Dementia in her mother; Depression in her sister; Fibromyalgia in her sister; Hypertension in her mother; Lupus in her mother.She reports that she has never smoked. She has never used smokeless tobacco. She reports that she does not drink  alcohol or use drugs.    ROS Review of Systems  Constitutional: Negative.   HENT: Negative.   Eyes: Negative for visual disturbance.  Respiratory: Negative for shortness of breath.   Cardiovascular: Negative for chest pain.  Gastrointestinal: Negative for abdominal pain.  Musculoskeletal: Negative for arthralgias.  Psychiatric/Behavioral: Negative for confusion, decreased concentration and dysphoric mood. The patient is nervous/anxious (relying on tranxene for residual symptoms).     Objective:  Temp (!) 97.3 F (36.3 C) (Oral)   Ht 5\' 4"  (1.626 m)   Wt 178 lb 3.2 oz (80.8 kg)   BMI 30.59 kg/m   BP Readings from Last 3 Encounters:  09/26/17 126/85  08/24/17 134/90  07/11/17 137/83    Wt Readings from Last 3 Encounters:  11/07/17 178 lb 3.2 oz (80.8 kg)  09/26/17 182 lb 2 oz (82.6 kg)  08/24/17 183 lb 3.2 oz (83.1 kg)     Physical Exam  Constitutional: She is oriented to person, place, and time. She appears well-developed and well-nourished. No distress.  HENT:  Head: Normocephalic and atraumatic.  Cardiovascular: Normal rate and regular rhythm.  Pulmonary/Chest: Breath sounds normal.  Abdominal: Soft. There is no tenderness.  Neurological: She is alert and oriented to person, place, and time.  Skin: Skin is warm and dry.  Psychiatric: She has a normal mood and affect. Her behavior is normal. Thought content normal.      Assessment & Plan:   Leslie Robbins was seen today for depression and anxiety.  Diagnoses and all orders for this visit:  Depression with anxiety -  clorazepate (TRANXENE) 7.5 MG tablet; Take 1 tablet (7.5 mg total) by mouth at bedtime. -     DULoxetine (CYMBALTA) 60 MG capsule; Take 1 capsule (60 mg total) by mouth 2 (two) times daily.  UTI symptoms -     Urinalysis, Complete -     Urine Culture; Future -     Urine Culture -     Microscopic Examination       I have changed [place] clorazepate. I am also having her maintain her  RESTASIS MULTIDOSE, aspirin EC, cetirizine, Acetaminophen (TYLENOL ARTHRITIS EXT RELIEF PO), cholecalciferol, amLODipine, levothyroxine, omeprazole, rosuvastatin, triamterene-hydrochlorothiazide, meloxicam, and DULoxetine.  Allergies as of 11/07/2017      Reactions   Sulfonamide Derivatives Rash   SULFONAMIDES INCREASE SENSITIVITY   TO SUNLIGHT > MORE LIKELY TO SUNBURN SUNBURN-LIKE REDNESS      Medication List        Accurate as of 11/07/17  9:20 PM. Always use your most recent med list.          amLODipine 5 MG tablet Commonly known as:  NORVASC Take 1 tablet (5 mg total) by mouth daily.   aspirin EC 81 MG tablet Take 81 mg by mouth daily. WILL STOP PRIOR TO PROCEDURE   cetirizine 10 MG tablet Commonly known as:  ZYRTEC Take 10 mg by mouth daily as needed for allergies.   cholecalciferol 1000 units tablet Commonly known as:  VITAMIN D Take 2,000 Units by mouth 3 (three) times a week.   clorazepate 7.5 MG tablet Commonly known as:  TRANXENE Take 1 tablet (7.5 mg total) by mouth at bedtime.   DULoxetine 60 MG capsule Commonly known as:  CYMBALTA Take 1 capsule (60 mg total) by mouth 2 (two) times daily.   levothyroxine 112 MCG tablet Commonly known as:  SYNTHROID, LEVOTHROID TAKE 1 TABLET (112 MCG TOTAL) BY MOUTH DAILY.   meloxicam 15 MG tablet Commonly known as:  MOBIC Take 15 mg by mouth daily.   omeprazole 40 MG capsule Commonly known as:  PRILOSEC TAKE 1 CAPSULE (40 MG TOTAL) BY MOUTH DAILY.   RESTASIS MULTIDOSE 0.05 % ophthalmic emulsion Generic drug:  cycloSPORINE INSTILL 1 DROP INTO EACH EYE TWICE DAILY AS NEEDED FOR IRRITATION   rosuvastatin 20 MG tablet Commonly known as:  CRESTOR Take 1 tablet (20 mg total) by mouth daily.   triamterene-hydrochlorothiazide 37.5-25 MG tablet Commonly known as:  MAXZIDE-25 Take 1 tablet by mouth daily. As needed for swelling   TYLENOL ARTHRITIS EXT RELIEF PO Take by mouth.        Follow-up: Return in about 3  months (around 02/06/2018).  Claretta Fraise, M.D.

## 2017-11-08 LAB — URINE CULTURE

## 2017-11-16 IMAGING — MR MR SHOULDER*R* W/O CM
4 of 5 series · 21 of 40 positions shown · non-contrast
Comparison: None.

CLINICAL DATA: Right shoulder pain extending through the right arm
with weakness.

EXAM:
MRI OF THE RIGHT SHOULDER WITHOUT CONTRAST
TECHNIQUE: Multiplanar, multisequence MR imaging of the shoulder was performed.
No intravenous contrast was administered.

[Series 3: t2fs axial · axial · 3.0mm · 0.29mm/px · z∈[+8,+51]mm · 3 of 20 slices shown]
[im 4/20]
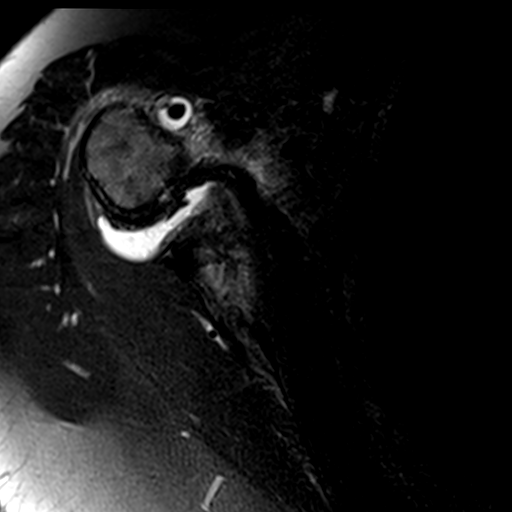
[im 10/20]
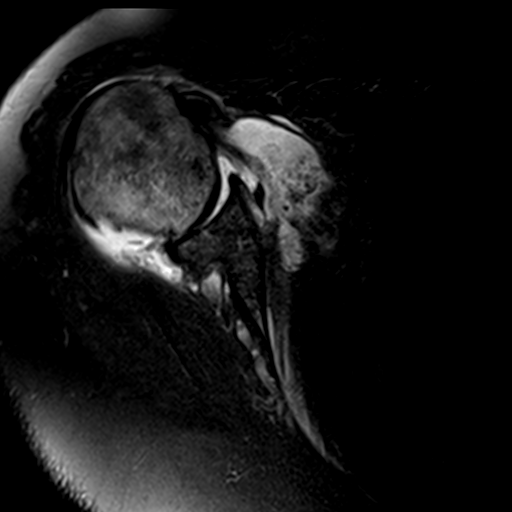
[im 16/20]
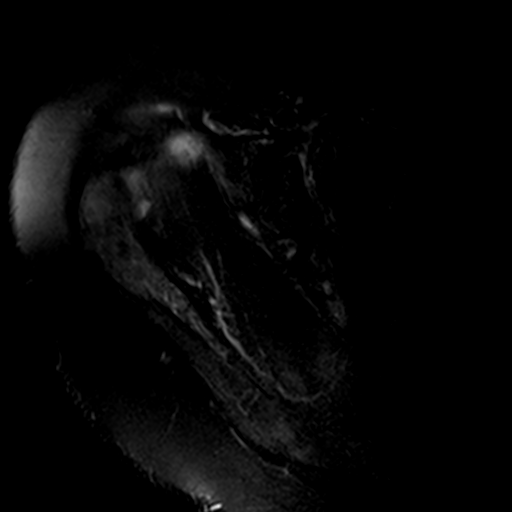

[Series 5: PD · oblique · 3.0mm · 0.30mm/px · 7 of 20 slices shown]
[im 1/20]
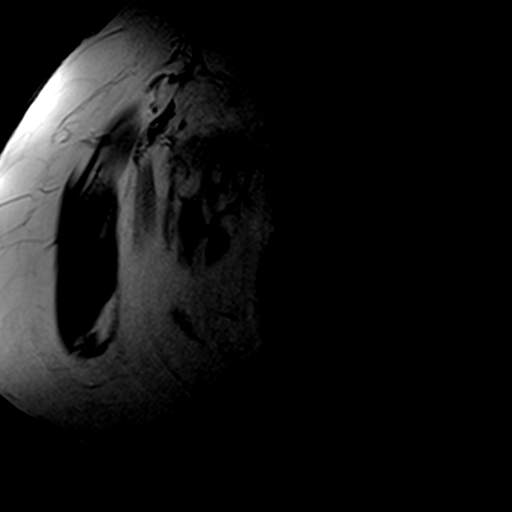
[im 4/20]
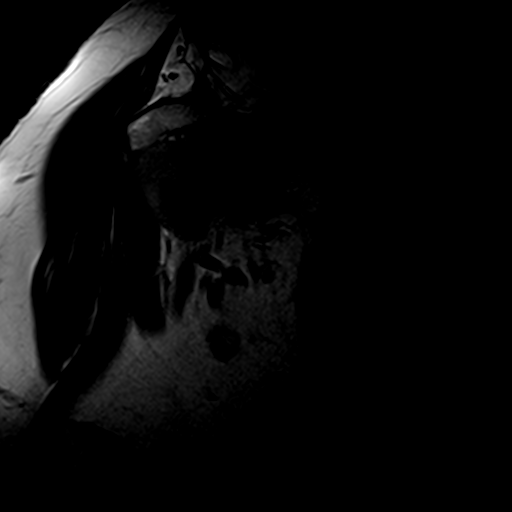
[im 7/20]
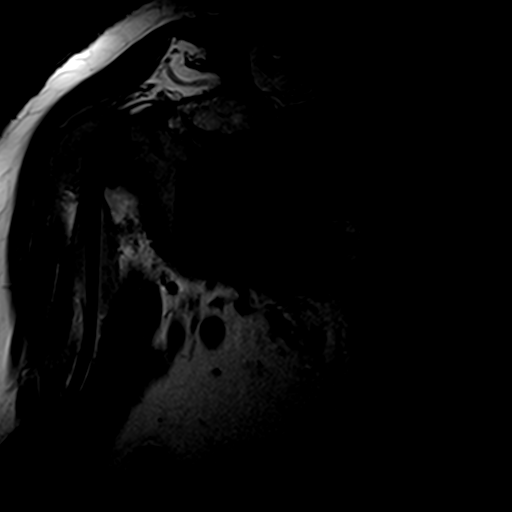
[im 10/20]
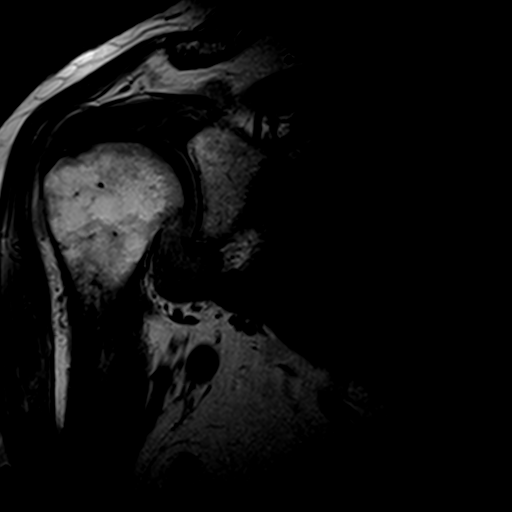
[im 13/20]
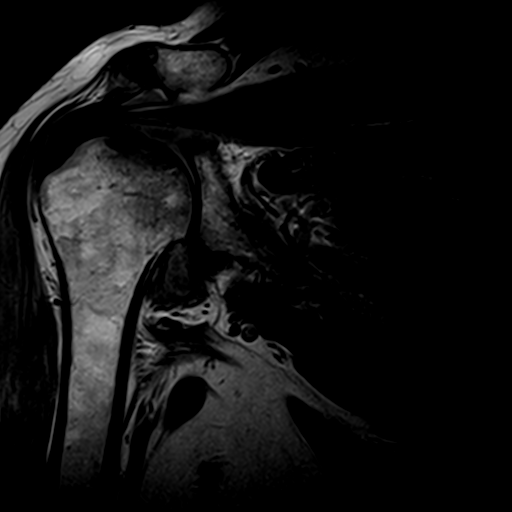
[im 16/20]
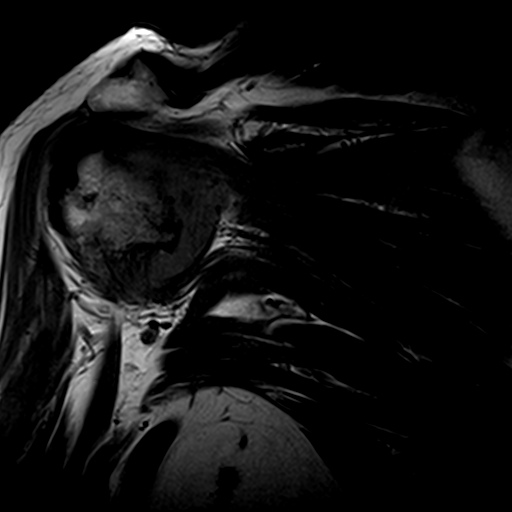
[im 20/20]
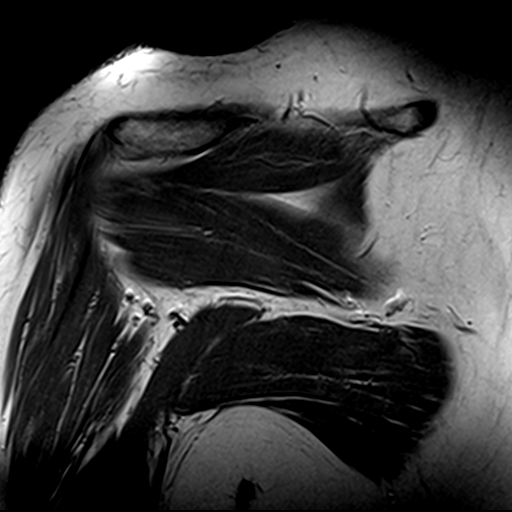

[Series 6: t2fs_blade_cor · oblique · 3.0mm · 0.56mm/px · 3 of 22 slices shown]
[im 4/22]
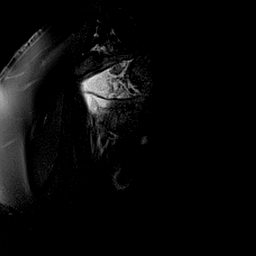
[im 13/22]
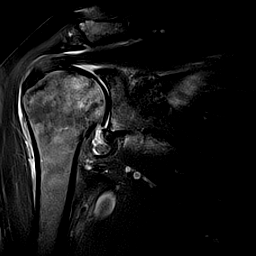
[im 19/22]
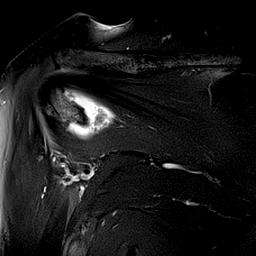

[Series 7: T1 · oblique · 3.0mm · 0.27mm/px · 8 of 24 slices shown]
[im 1/24]
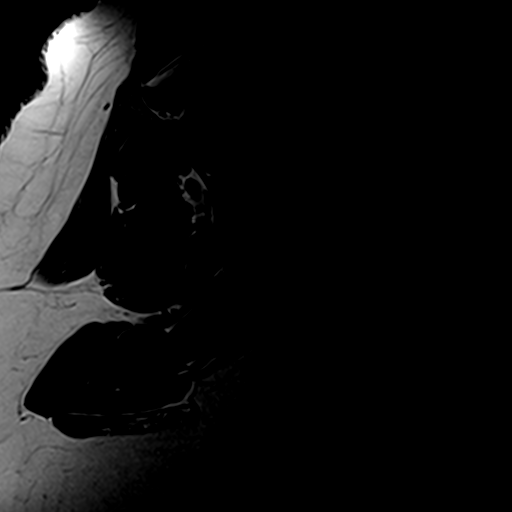
[im 3/24]
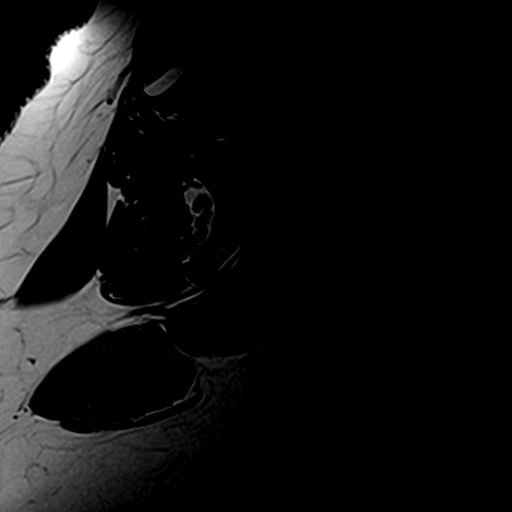
[im 6/24]
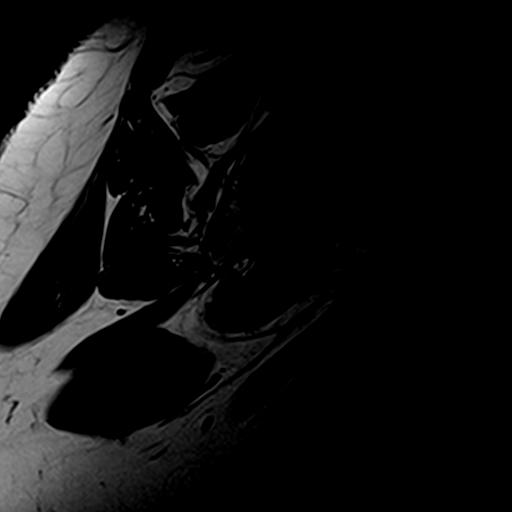
[im 9/24]
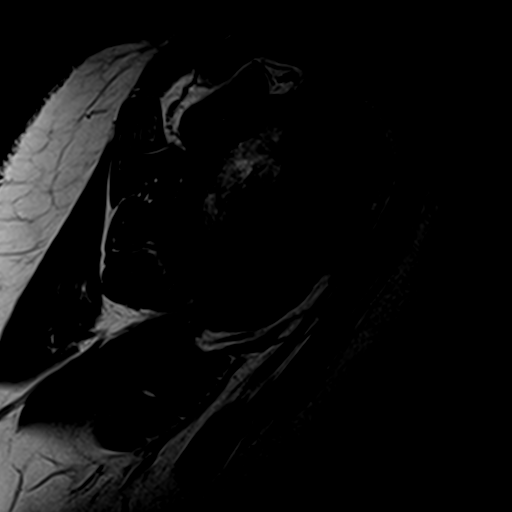
[im 12/24]
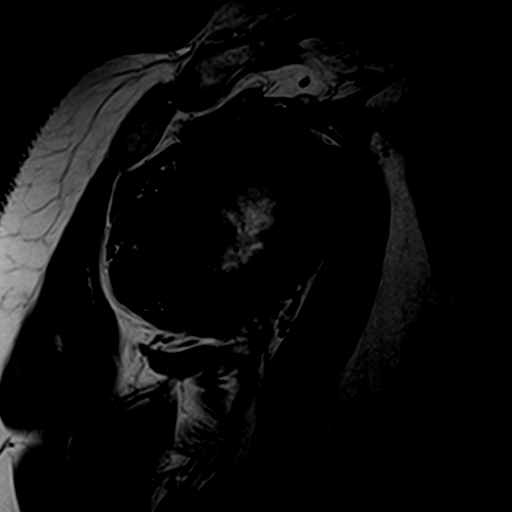
[im 15/24]
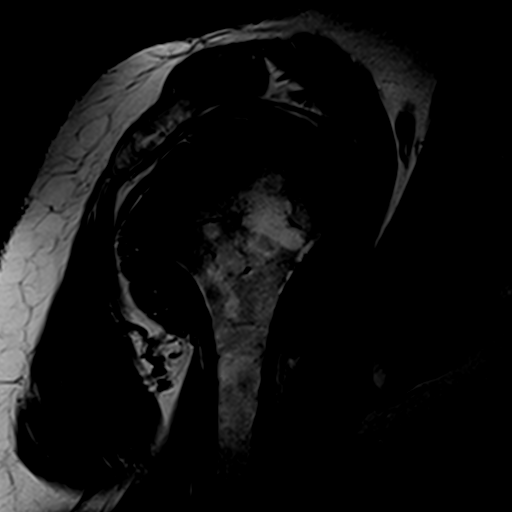
[im 18/24]
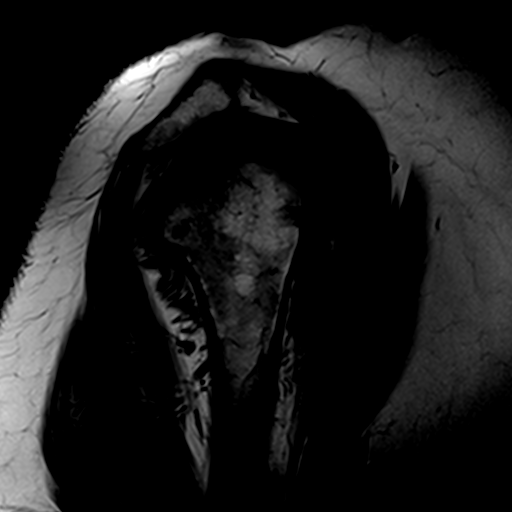
[im 21/24]
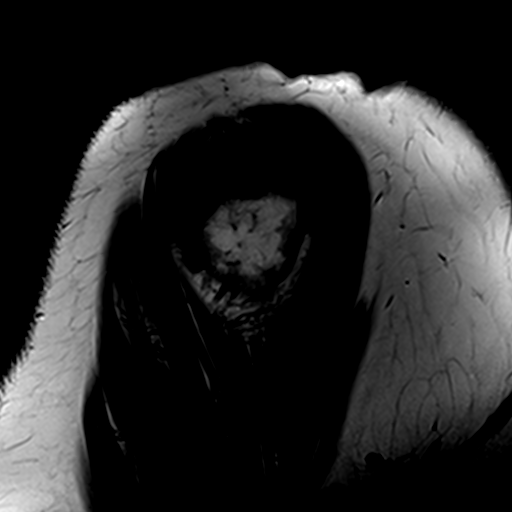

[21 of 40 positions shown; findings below may reference images not displayed]

FINDINGS: Rotator cuff: Moderate tendinosis of the supraspinatus tendon with a
small partial-thickness bursal surface tear of the anterior fibers.
Infraspinatus tendon is intact. Teres minor tendon is intact.
Subscapularis tendon is intact.

Muscles: No atrophy or fatty replacement of nor abnormal signal
within, the muscles of the rotator cuff.

Biceps long head:  Intact.

Acromioclavicular Joint: Mild arthropathy of the acromioclavicular
joint. Type I acromion. Moderate amount of subacromial/subdeltoid
bursal fluid.

Glenohumeral Joint: Large joint effusion. Extensive full-thickness
cartilage loss throughout the glenohumeral joint with subchondral
reactive marrow edema in the humeral head and to lesser extent
glenoid. Multiple glenohumeral joint loose bodies and synovitis.

Labrum:  Intact.

Bones:  No acute fracture or dislocation.

Other: No fluid collection or hematoma.
IMPRESSION: 1. Extensive full-thickness cartilage loss throughout the
glenohumeral joint with subchondral reactive marrow edema in the
humeral head and to a lesser extent glenoid. Large joint effusion
with multiple loose bodies and synovitis. The degree of cartilage
loss is disproportionate to marginal osteophyte formation. There may
be an element of an inflammatory or crystalline arthropathy with
superimposed severe osteoarthritis.
2. Moderate tendinosis of the supraspinatus tendon with a small
partial-thickness bursal surface tear of the anterior fibers.

## 2017-12-17 ENCOUNTER — Other Ambulatory Visit: Payer: Self-pay | Admitting: Family Medicine

## 2018-02-12 ENCOUNTER — Other Ambulatory Visit: Payer: Self-pay | Admitting: Family Medicine

## 2018-02-12 ENCOUNTER — Encounter: Payer: Self-pay | Admitting: Family Medicine

## 2018-02-12 ENCOUNTER — Ambulatory Visit (INDEPENDENT_AMBULATORY_CARE_PROVIDER_SITE_OTHER): Payer: BLUE CROSS/BLUE SHIELD

## 2018-02-12 ENCOUNTER — Ambulatory Visit (INDEPENDENT_AMBULATORY_CARE_PROVIDER_SITE_OTHER): Payer: BLUE CROSS/BLUE SHIELD | Admitting: Family Medicine

## 2018-02-12 VITALS — BP 116/73 | Temp 98.0°F | Ht 64.0 in | Wt 167.0 lb

## 2018-02-12 DIAGNOSIS — Z0000 Encounter for general adult medical examination without abnormal findings: Secondary | ICD-10-CM

## 2018-02-12 DIAGNOSIS — I10 Essential (primary) hypertension: Secondary | ICD-10-CM | POA: Diagnosis not present

## 2018-02-12 DIAGNOSIS — E039 Hypothyroidism, unspecified: Secondary | ICD-10-CM

## 2018-02-12 DIAGNOSIS — R634 Abnormal weight loss: Secondary | ICD-10-CM

## 2018-02-12 DIAGNOSIS — Z780 Asymptomatic menopausal state: Secondary | ICD-10-CM

## 2018-02-12 DIAGNOSIS — Z13820 Encounter for screening for osteoporosis: Secondary | ICD-10-CM

## 2018-02-12 DIAGNOSIS — E785 Hyperlipidemia, unspecified: Secondary | ICD-10-CM

## 2018-02-12 DIAGNOSIS — Z0001 Encounter for general adult medical examination with abnormal findings: Secondary | ICD-10-CM | POA: Diagnosis not present

## 2018-02-12 DIAGNOSIS — F418 Other specified anxiety disorders: Secondary | ICD-10-CM

## 2018-02-12 DIAGNOSIS — R42 Dizziness and giddiness: Secondary | ICD-10-CM

## 2018-02-12 DIAGNOSIS — M85852 Other specified disorders of bone density and structure, left thigh: Secondary | ICD-10-CM | POA: Diagnosis not present

## 2018-02-12 DIAGNOSIS — J0100 Acute maxillary sinusitis, unspecified: Secondary | ICD-10-CM

## 2018-02-12 MED ORDER — MELOXICAM 15 MG PO TABS: 15.0000 mg | ORAL_TABLET | Freq: Every day | ORAL | 1 refills | Status: DC

## 2018-02-12 MED ORDER — DULOXETINE HCL 60 MG PO CPEP: 60.0000 mg | ORAL_CAPSULE | Freq: Two times a day (BID) | ORAL | 0 refills | Status: DC

## 2018-02-12 MED ORDER — TRIAMTERENE-HCTZ 37.5-25 MG PO TABS: 1.0000 | ORAL_TABLET | Freq: Every day | ORAL | 1 refills | Status: DC

## 2018-02-12 MED ORDER — AMLODIPINE BESYLATE 5 MG PO TABS: 5.0000 mg | ORAL_TABLET | Freq: Every day | ORAL | 1 refills | Status: DC

## 2018-02-12 MED ORDER — OMEPRAZOLE 40 MG PO CPDR: DELAYED_RELEASE_CAPSULE | ORAL | 1 refills | Status: DC

## 2018-02-12 MED ORDER — PSEUDOEPHEDRINE-GUAIFENESIN ER 60-600 MG PO TB12: 1.0000 | ORAL_TABLET | Freq: Two times a day (BID) | ORAL | 0 refills | Status: AC

## 2018-02-12 MED ORDER — AMOXICILLIN-POT CLAVULANATE 875-125 MG PO TABS: 1.0000 | ORAL_TABLET | Freq: Two times a day (BID) | ORAL | 0 refills | Status: DC

## 2018-02-12 MED ORDER — ROSUVASTATIN CALCIUM 20 MG PO TABS: 20.0000 mg | ORAL_TABLET | Freq: Every day | ORAL | 1 refills | Status: DC

## 2018-02-12 MED ORDER — RISEDRONATE SODIUM 150 MG PO TABS: 150.0000 mg | ORAL_TABLET | ORAL | 3 refills | Status: DC

## 2018-02-12 MED ORDER — LEVOTHYROXINE SODIUM 112 MCG PO TABS: ORAL_TABLET | ORAL | 1 refills | Status: DC

## 2018-02-12 NOTE — Patient Instructions (Signed)
Continue current medications. Continue good therapeutic lifestyle changes which include good diet and exercise. Fall precautions discussed with patient. If an FOBT was given today- please return it to our front desk. If you are over 64 years old - you may need Prevnar 13 or the adult Pneumonia vaccine.  **Flu shots are available--- please call and schedule a FLU-CLINIC appointment**  After your visit with us today you will receive a survey in the mail or online from [employer] regarding your care with us. Please take a moment to fill this out. Your feedback is very important to us as you can help us better understand your patient needs as well as improve your experience and satisfaction. WE CARE ABOUT YOU!!!    

## 2018-02-12 NOTE — Progress Notes (Signed)
Subjective:  Patient ID: Leslie Robbins, female    DOB: Mar 26, 1955  Age: 62 y.o. MRN: [id number]  CC: Hyperlipidemia (CPE ); Hypertension; Hypothyroidism; and Annual Exam   HPI Leslie Robbins presents for follow-up of hypertension. Patient has no history of headache chest pain or shortness of breath or recent cough. Patient also denies symptoms of TIA such as numbness weakness lateralizing. Patient checks  blood pressure at home and has not had any elevated readings recently. Patient denies side effects from his medication. States taking it regularly. Patient presents for follow-up on  thyroid. The patient has a history of hypothyroidism for many years. It has been stable recently. Pt. denies any change in  voice, loss of hair, heat or cold intolerance. Energy level has been adequate to good. Patient denies constipation and diarrhea. No myxedema. Medication is as noted below. Verified that pt is taking it daily on an empty stomach. Well tolerated.  Symptoms include congestion, facial pain, nasal congestion, non productive cough, post nasal drip and sinus pressure. There is no fever, chills, or sweats. Onset of symptoms was a few days ago, gradually worsening since that time.    Depression screen [facility] 2/9 02/12/2018 11/07/2017 09/26/2017  Decreased Interest _0 Down, Depressed, Hopeless 0 1 1  PHQ - 2 Score _1 Altered sleeping - 0 0  Tired, decreased energy - 2 2  Change in appetite - 1 1  Feeling bad or failure about yourself  - 0 0  Trouble concentrating - 1 1  Moving slowly or fidgety/restless - 1 1  Suicidal thoughts - 0 0  PHQ-9 Score - 7 7  Difficult doing work/chores - Somewhat difficult -  Some recent data might be hidden    History Leslie Robbins has a past medical history of Anxiety, Arthritis, Depression, GERD (gastroesophageal reflux disease), History of hiatal hernia, Hypercholesteremia, Hypertension, and Hypothyroidism.   She has a past surgical history that includes Abdominal  hysterectomy; Tubal ligation; Right Carpal Tunnel Release; Breast lumpectomy; Colonoscopy (N/A, 05/30/2012); Dilation and curettage of uterus; Cardiac catheterization; Total knee arthroplasty (Left, 09/16/2013); Joint replacement (Right); Total shoulder arthroplasty (Right, 06/17/2016); and Total shoulder arthroplasty (Right, 06/17/2016).   Her family history includes Dementia in her mother; Depression in her sister; Fibromyalgia in her sister; Hypertension in her mother; Lupus in her mother.She reports that she has never smoked. She has never used smokeless tobacco. She reports that she does not drink alcohol or use drugs.    ROS Review of Systems  Constitutional: Negative for activity change, appetite change, chills, diaphoresis, fatigue, fever and unexpected weight change.  HENT: Positive for congestion, postnasal drip, rhinorrhea and sinus pressure. Negative for ear discharge, ear pain, hearing loss, nosebleeds, sneezing, sore throat and trouble swallowing.   Eyes: Negative for pain.  Respiratory: Negative for cough, chest tightness and shortness of breath.   Cardiovascular: Negative for chest pain and palpitations.  Gastrointestinal: Negative for abdominal pain, constipation, diarrhea, nausea and vomiting.  Endocrine: Negative for cold intolerance, heat intolerance, polydipsia, polyphagia and polyuria.  Genitourinary: Negative for dysuria, frequency and menstrual problem.  Musculoskeletal: Negative for arthralgias and joint swelling.  Skin: Negative for rash.  Allergic/Immunologic: Negative for environmental allergies.  Neurological: Negative for dizziness, weakness, numbness and headaches.  Psychiatric/Behavioral: Negative for agitation and dysphoric mood.    Objective:  BP 116/73 (BP Location: Right Arm)   Temp 98 F (36.7 C) (Oral)   Ht _2  (1.626 m)   Wt 167 lb (  75.8 kg)   BMI 28.67 kg/m   BP Readings from Last 3 Encounters:  02/12/18 116/73  09/26/17 126/85  08/24/17  134/90    Wt Readings from Last 3 Encounters:  02/12/18 167 lb (75.8 kg)  11/07/17 178 lb 3.2 oz (80.8 kg)  09/26/17 182 lb 2 oz (82.6 kg)     Physical Exam  Constitutional: She is oriented to person, place, and time. She appears well-developed and well-nourished. No distress.  HENT:  Head: Normocephalic and atraumatic.  Right Ear: External ear normal.  Left Ear: External ear normal.  Nose: Nose normal.  Mouth/Throat: Oropharynx is clear and moist.  Eyes: Pupils are equal, round, and reactive to light. Conjunctivae and EOM are normal.  Neck: Normal range of motion. Neck supple. No thyromegaly present.  Cardiovascular: Normal rate, regular rhythm and normal heart sounds.  No murmur heard. Pulmonary/Chest: Effort normal and breath sounds normal. No respiratory distress. She has no wheezes. She has no rales. Right breast exhibits no inverted nipple, no mass and no tenderness. Left breast exhibits no inverted nipple, no mass and no tenderness. Breasts are symmetrical.  Abdominal: Soft. Normal appearance and bowel sounds are normal. She exhibits no distension, no abdominal bruit and no mass. There is no splenomegaly or hepatomegaly. There is no tenderness. There is no tenderness at McBurney's point and negative Murphy's sign.  Musculoskeletal: Normal range of motion. She exhibits no edema or tenderness.  Lymphadenopathy:    She has no cervical adenopathy.  Neurological: She is alert and oriented to person, place, and time. She has normal reflexes.  Skin: Skin is warm and dry. No rash noted.  Psychiatric: She has a normal mood and affect. Her behavior is normal. Judgment and thought content normal.      Assessment & Plan:   Leslie Robbins was seen today for hyperlipidemia, hypertension, hypothyroidism and annual exam.  Diagnoses and all orders for this visit:  Annual physical exam -     CBC with Differential/Platelet -     CMP14+EGFR -     Lipid panel -     VITAMIN D 25 Hydroxy (Vit-D  Deficiency, Fractures) -     Urinalysis, Complete  Essential hypertension -     CBC with Differential/Platelet -     CMP14+EGFR -     amLODipine (NORVASC) 5 MG tablet; Take 1 tablet (5 mg total) by mouth daily.  Hypothyroidism, unspecified type -     CBC with Differential/Platelet -     levothyroxine (SYNTHROID, LEVOTHROID) 112 MCG tablet; TAKE 1 TABLET (112 MCG TOTAL) BY MOUTH DAILY. -     TSH + free T4  Hyperlipidemia with target LDL less than 100 -     CBC with Differential/Platelet -     Lipid panel -     rosuvastatin (CRESTOR) 20 MG tablet; Take 1 tablet (20 mg total) by mouth daily.  Screening for osteoporosis -     DG [facility] DEXA; Future  Postmenopausal -     DG [facility] DEXA; Future  Depression with anxiety -     DULoxetine (CYMBALTA) 60 MG capsule; Take 1 capsule (60 mg total) by mouth 2 (two) times daily.  Dizziness  Weight loss, non-intentional  Acute maxillary sinusitis, recurrence not specified  Other orders -     meloxicam (MOBIC) 15 MG tablet; Take 1 tablet (15 mg total) by mouth daily. -     omeprazole (PRILOSEC) 40 MG capsule; TAKE 1 CAPSULE (40 MG TOTAL) BY MOUTH DAILY. -  triamterene-hydrochlorothiazide (MAXZIDE-25) 37.5-25 MG tablet; Take 1 tablet by mouth daily. As needed for swelling -     pseudoephedrine-guaifenesin (MUCINEX D) 60-600 MG 12 hr tablet; Take 1 tablet by mouth every 12 (twelve) hours for 10 days. As needed for congestion -     amoxicillin-clavulanate (AUGMENTIN) 875-125 MG tablet; Take 1 tablet by mouth 2 (two) times daily. Take all of this medication  Discussed MRI of brain due to dizziness weight loss and Persistent sinus sx. Pt. Prefers to try antibiotic first.  Also we need to check her TSH and free T4 to make sure she is not overmedicated.  That is pending.     I have changed Erasmo Score. Bang's meloxicam. I am also having her start on pseudoephedrine-guaifenesin and amoxicillin-clavulanate. Additionally, I am having her maintain her  RESTASIS MULTIDOSE, aspirin EC, cetirizine, Acetaminophen (TYLENOL ARTHRITIS EXT RELIEF PO), cholecalciferol, clorazepate, omeprazole, triamterene-hydrochlorothiazide, levothyroxine, rosuvastatin, amLODipine, and DULoxetine.  Allergies as of 02/12/2018      Reactions   Sulfonamide Derivatives Rash   SULFONAMIDES INCREASE SENSITIVITY   TO SUNLIGHT > MORE LIKELY TO SUNBURN SUNBURN-LIKE REDNESS      Medication List        Accurate as of 02/12/18  5:33 PM. Always use your most recent med list.          amLODipine 5 MG tablet Commonly known as:  NORVASC Take 1 tablet (5 mg total) by mouth daily.   amoxicillin-clavulanate 875-125 MG tablet Commonly known as:  AUGMENTIN Take 1 tablet by mouth 2 (two) times daily. Take all of this medication   aspirin EC 81 MG tablet Take 81 mg by mouth daily. WILL STOP PRIOR TO PROCEDURE   cetirizine 10 MG tablet Commonly known as:  ZYRTEC Take 10 mg by mouth daily as needed for allergies.   cholecalciferol 1000 units tablet Commonly known as:  VITAMIN D Take 2,000 Units by mouth 3 (three) times a week.   clorazepate 7.5 MG tablet Commonly known as:  TRANXENE Take 1 tablet (7.5 mg total) by mouth at bedtime.   DULoxetine 60 MG capsule Commonly known as:  CYMBALTA Take 1 capsule (60 mg total) by mouth 2 (two) times daily.   levothyroxine 112 MCG tablet Commonly known as:  SYNTHROID, LEVOTHROID TAKE 1 TABLET (112 MCG TOTAL) BY MOUTH DAILY.   meloxicam 15 MG tablet Commonly known as:  MOBIC Take 1 tablet (15 mg total) by mouth daily.   omeprazole 40 MG capsule Commonly known as:  PRILOSEC TAKE 1 CAPSULE BY MOUTH EVERY DAY   omeprazole 40 MG capsule Commonly known as:  PRILOSEC TAKE 1 CAPSULE (40 MG TOTAL) BY MOUTH DAILY.   pseudoephedrine-guaifenesin 60-600 MG 12 hr tablet Commonly known as:  MUCINEX D Take 1 tablet by mouth every 12 (twelve) hours for 10 days. As needed for congestion   RESTASIS MULTIDOSE 0.05 % ophthalmic  emulsion Generic drug:  cycloSPORINE INSTILL 1 DROP INTO EACH EYE TWICE DAILY AS NEEDED FOR IRRITATION   risedronate 150 MG tablet Commonly known as:  ACTONEL Take 1 tablet (150 mg total) by mouth every 30 (thirty) days. with water, on an empty stomach, take nothing by mouth   rosuvastatin 20 MG tablet Commonly known as:  CRESTOR Take 1 tablet (20 mg total) by mouth daily.   triamterene-hydrochlorothiazide 37.5-25 MG tablet Commonly known as:  MAXZIDE-25 Take 1 tablet by mouth daily. As needed for swelling   TYLENOL ARTHRITIS EXT RELIEF PO Take by mouth.  Follow-up: Return in about 6 months (around 08/14/2018).  Claretta Fraise, M.D.

## 2018-02-13 LAB — LIPID PANEL
CHOL/HDL RATIO: 2.2 ratio (ref 0.0–4.4)
Cholesterol, Total: 125 mg/dL (ref 100–199)
HDL: 56 mg/dL (ref >39)
LDL Calculated: 55 mg/dL (ref 0–99)
TRIGLYCERIDES: 70 mg/dL (ref 0–149)
VLDL CHOLESTEROL CAL: 14 mg/dL (ref 5–40)

## 2018-02-13 LAB — CMP14+EGFR
ALBUMIN: 4.2 g/dL (ref 3.6–4.8)
ALT: 18 IU/L (ref 0–32)
AST: 26 IU/L (ref 0–40)
Albumin/Globulin Ratio: 1.4 (ref 1.2–2.2)
Alkaline Phosphatase: 134 IU/L — ABNORMAL HIGH (ref 39–117)
BUN/Creatinine Ratio: 22 (ref 12–28)
BUN: 24 mg/dL (ref 8–27)
Bilirubin Total: 0.4 mg/dL (ref 0.0–1.2)
CO2: 25 mmol/L (ref 20–29)
Calcium: 10.3 mg/dL (ref 8.7–10.3)
Chloride: 100 mmol/L (ref 96–106)
Creatinine, Ser: 1.1 mg/dL — ABNORMAL HIGH (ref 0.57–1.00)
GFR, EST AFRICAN AMERICAN: 61 mL/min/{1.73_m2} (ref >59)
GFR, EST NON AFRICAN AMERICAN: 53 mL/min/{1.73_m2} — AB (ref >59)
GLOBULIN, TOTAL: 3 g/dL (ref 1.5–4.5)
Glucose: 81 mg/dL (ref 65–99)
Potassium: 3.7 mmol/L (ref 3.5–5.2)
Sodium: 140 mmol/L (ref 134–144)
Total Protein: 7.2 g/dL (ref 6.0–8.5)

## 2018-02-13 LAB — CBC WITH DIFFERENTIAL/PLATELET
Basophils Absolute: 0.1 10*3/uL (ref 0.0–0.2)
Basos: 1 %
EOS (ABSOLUTE): 0.2 10*3/uL (ref 0.0–0.4)
EOS: 4 %
HEMATOCRIT: 37.3 % (ref 34.0–46.6)
HEMOGLOBIN: 11.7 g/dL (ref 11.1–15.9)
Immature Grans (Abs): 0 10*3/uL (ref 0.0–0.1)
Immature Granulocytes: 0 %
LYMPHS ABS: 1.3 10*3/uL (ref 0.7–3.1)
Lymphs: 30 %
MCH: 25.3 pg — AB (ref 26.6–33.0)
MCHC: 31.4 g/dL — AB (ref 31.5–35.7)
MCV: 81 fL (ref 79–97)
Monocytes Absolute: 0.4 10*3/uL (ref 0.1–0.9)
Monocytes: 8 %
NEUTROS ABS: 2.6 10*3/uL (ref 1.4–7.0)
Neutrophils: 57 %
Platelets: 242 10*3/uL (ref 150–450)
RBC: 4.63 x10E6/uL (ref 3.77–5.28)
RDW: 15 % (ref 12.3–15.4)
WBC: 4.5 10*3/uL (ref 3.4–10.8)

## 2018-02-13 LAB — TSH+FREE T4
Free T4: 1.6 ng/dL (ref 0.82–1.77)
TSH: 0.105 u[IU]/mL — ABNORMAL LOW (ref 0.450–4.500)

## 2018-02-13 LAB — VITAMIN D 25 HYDROXY (VIT D DEFICIENCY, FRACTURES): Vit D, 25-Hydroxy: 29.5 ng/mL — ABNORMAL LOW (ref 30.0–100.0)

## 2018-02-14 ENCOUNTER — Telehealth: Payer: Self-pay | Admitting: Family Medicine

## 2018-02-15 ENCOUNTER — Encounter: Payer: Self-pay | Admitting: *Deleted

## 2018-02-15 ENCOUNTER — Telehealth: Payer: Self-pay | Admitting: Family Medicine

## 2018-02-26 NOTE — Telephone Encounter (Signed)
Results were discussed with patient by Otilio Connors on 02/23/2018

## 2018-03-01 ENCOUNTER — Ambulatory Visit: Payer: BLUE CROSS/BLUE SHIELD | Admitting: Family Medicine

## 2018-03-01 ENCOUNTER — Encounter: Payer: Self-pay | Admitting: Family Medicine

## 2018-03-01 VITALS — BP 116/74 | HR 85 | Temp 98.4°F | Ht 64.0 in | Wt 164.4 lb

## 2018-03-01 DIAGNOSIS — N393 Stress incontinence (female) (male): Secondary | ICD-10-CM

## 2018-03-01 DIAGNOSIS — J329 Chronic sinusitis, unspecified: Secondary | ICD-10-CM

## 2018-03-01 DIAGNOSIS — R059 Cough, unspecified: Secondary | ICD-10-CM

## 2018-03-01 DIAGNOSIS — R05 Cough: Secondary | ICD-10-CM | POA: Diagnosis not present

## 2018-03-01 DIAGNOSIS — J40 Bronchitis, not specified as acute or chronic: Secondary | ICD-10-CM | POA: Diagnosis not present

## 2018-03-01 MED ORDER — MOXIFLOXACIN HCL 400 MG PO TABS: 400.0000 mg | ORAL_TABLET | Freq: Every day | ORAL | 0 refills | Status: DC

## 2018-03-01 MED ORDER — BETAMETHASONE SOD PHOS & ACET 6 (3-3) MG/ML IJ SUSP
6.0000 mg | Freq: Once | INTRAMUSCULAR | Status: AC
  Administered 2018-03-01: 6 mg via INTRAMUSCULAR

## 2018-03-01 MED ORDER — VITAMIN D (ERGOCALCIFEROL) 1.25 MG (50000 UNIT) PO CAPS: 50000.0000 [IU] | ORAL_CAPSULE | ORAL | 3 refills | Status: DC

## 2018-03-01 NOTE — Progress Notes (Signed)
Chief Complaint  Patient presents with  . Cough    pt here today c/o cough, congestion    HPI  Patient presents today for Patient presents with upper respiratory congestion. Rhinorrhea that is frequently purulent. There is moderate sore throat. Patient reports coughing frequently as well.  Green sputum noted. There is no fever, chills or sweats. The patient denies being short of breath. Onset 3 weeks  ago. Gradually worsening. Tried OTCs without improvement. No relief with augmentin  Pt. Notes increasing loss of urine involuntarily over the last two months. Better after BM for a while.   PMH: Smoking status noted ROS: Per HPI  Objective: BP 116/74   Pulse 85   Temp 98.4 F (36.9 C) (Oral)   Ht 5\' 4"  (1.626 m)   Wt 164 lb 6 oz (74.6 kg)   BMI 28.21 kg/m  Gen: NAD, alert, cooperative with exam HEENT: NCAT, Nasal passages swollen, red  CV: RRR, good S1/S2, no murmur Resp: Bronchitis changes with scattered wheezes, non-labored Ext: No edema, warm Neuro: Alert and oriented, No gross deficits  Assessment and plan:  1. Cough   2. Sinobronchitis   3. Stress incontinence in female     Meds ordered this encounter  Medications  . betamethasone acetate-betamethasone sodium phosphate (CELESTONE) injection 6 mg  . moxifloxacin (AVELOX) 400 MG tablet    Sig: Take 1 tablet (400 mg total) by mouth daily. Take all of these, for infection    Dispense:  10 tablet    Refill:  0  . Vitamin D, Ergocalciferol, (DRISDOL) 1.25 MG (50000 UT) CAPS capsule    Sig: Take 1 capsule (50,000 Units total) by mouth every 7 (seven) days.    Dispense:  13 capsule    Refill:  3    Orders Placed This Encounter  Procedures  . Ambulatory referral to Urology    Referral Priority:   Routine    Referral Type:   Consultation    Referral Reason:   Specialty Services Required    Requested Specialty:   Urology    Number of Visits Requested:   1    Follow up as needed.  Claretta Fraise, MD

## 2018-03-02 DIAGNOSIS — Z1231 Encounter for screening mammogram for malignant neoplasm of breast: Secondary | ICD-10-CM | POA: Diagnosis not present

## 2018-03-02 LAB — HM MAMMOGRAPHY

## 2018-03-26 ENCOUNTER — Encounter: Payer: Self-pay | Admitting: *Deleted

## 2018-04-10 ENCOUNTER — Encounter: Payer: Self-pay | Admitting: Obstetrics & Gynecology

## 2018-04-10 ENCOUNTER — Ambulatory Visit: Payer: Medicare HMO | Admitting: Obstetrics & Gynecology

## 2018-04-10 ENCOUNTER — Other Ambulatory Visit: Payer: Self-pay

## 2018-04-10 VITALS — BP 141/94 | HR 73 | Ht 64.0 in | Wt 170.0 lb

## 2018-04-10 DIAGNOSIS — N952 Postmenopausal atrophic vaginitis: Secondary | ICD-10-CM | POA: Diagnosis not present

## 2018-04-10 DIAGNOSIS — N941 Unspecified dyspareunia: Secondary | ICD-10-CM

## 2018-04-10 MED ORDER — ESTROGENS, CONJUGATED 0.625 MG/GM VA CREA: 1.0000 | TOPICAL_CREAM | Freq: Every day | VAGINAL | 11 refills | Status: DC

## 2018-04-10 NOTE — Progress Notes (Signed)
Chief Complaint  Patient presents with  . Dyspareunia      63 y.o. [id number] No LMP recorded. Patient has had a hysterectomy. The current method of family planning is status post hysterectomy.  Outpatient Encounter Medications as of 04/10/2018  Medication Sig Note  . Acetaminophen (TYLENOL ARTHRITIS EXT RELIEF PO) Take by mouth.   Marland Kitchen amLODipine (NORVASC) 5 MG tablet Take 1 tablet (5 mg total) by mouth daily.   Marland Kitchen aspirin EC 81 MG tablet Take 81 mg by mouth daily. WILL STOP PRIOR TO PROCEDURE   . cetirizine (ZYRTEC) 10 MG tablet Take 10 mg by mouth daily as needed for allergies.   . cholecalciferol (VITAMIN D) 1000 units tablet Take 2,000 Units by mouth 3 (three) times a week.   . clorazepate (TRANXENE) 7.5 MG tablet Take 1 tablet (7.5 mg total) by mouth at bedtime.   . DULoxetine (CYMBALTA) 60 MG capsule Take 1 capsule (60 mg total) by mouth 2 (two) times daily.   Marland Kitchen levothyroxine (SYNTHROID, LEVOTHROID) 112 MCG tablet TAKE 1 TABLET (112 MCG TOTAL) BY MOUTH DAILY.   Marland Kitchen omeprazole (PRILOSEC) 40 MG capsule TAKE 1 CAPSULE (40 MG TOTAL) BY MOUTH DAILY.   Marland Kitchen RESTASIS MULTIDOSE 0.05 % ophthalmic emulsion INSTILL 1 DROP INTO EACH EYE TWICE DAILY AS NEEDED FOR IRRITATION 07/27/2015: Received from: External Pharmacy  . risedronate (ACTONEL) 150 MG tablet Take 1 tablet (150 mg total) by mouth every 30 (thirty) days. with water, on an empty stomach, take nothing by mouth   . rosuvastatin (CRESTOR) 20 MG tablet Take 1 tablet (20 mg total) by mouth daily.   Marland Kitchen triamterene-hydrochlorothiazide (MAXZIDE-25) 37.5-25 MG tablet Take 1 tablet by mouth daily. As needed for swelling   . Vitamin D, Ergocalciferol, (DRISDOL) 1.25 MG (50000 UT) CAPS capsule Take 1 capsule (50,000 Units total) by mouth every 7 (seven) days.   . [DISCONTINUED] meloxicam (MOBIC) 15 MG tablet Take 1 tablet (15 mg total) by mouth daily.   Marland Kitchen conjugated estrogens (PREMARIN) vaginal cream Place 1 Applicatorful vaginally daily.   .  [DISCONTINUED] moxifloxacin (AVELOX) 400 MG tablet Take 1 tablet (400 mg total) by mouth daily. Take all of these, for infection    No facility-administered encounter medications on file as of 04/10/2018.     Subjective Pt has resumed sexual relations recently but it has been unbearable In the past has been uncomfortable but went 18 months with no sex due to husband and her other surgeries etc Painful with insertion Past Medical History:  Diagnosis Date  . Anxiety   . Arthritis   . Depression   . GERD (gastroesophageal reflux disease)   . History of hiatal hernia   . Hypercholesteremia   . Hypertension   . Hypothyroidism     Past Surgical History:  Procedure Laterality Date  . ABDOMINAL HYSTERECTOMY    . BREAST LUMPECTOMY     benign  . CARDIAC CATHETERIZATION     9 YRS AGO  . COLONOSCOPY N/A 05/30/2012   Procedure: COLONOSCOPY;  Surgeon: Rogene Houston, MD;  Location: [facility];  Service: Endoscopy;  Laterality: N/A;  830-moved to 1125 Ann to notify pt  . DILATION AND CURETTAGE OF UTERUS     X2  . JOINT REPLACEMENT Right    right hip  . Right Carpal Tunnel Release    . 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   Procedure: LEFT TOTAL KNEE ARTHROPLASTY;  Surgeon: Gearlean Alf, MD;  Location: [facility];  Service:  Orthopedics;  Laterality: Left;  . TOTAL SHOULDER ARTHROPLASTY Right 06/17/2016  . TOTAL SHOULDER ARTHROPLASTY Right 06/17/2016   Procedure: RIGHT TOTAL SHOULDER ARTHROPLASTY;  Surgeon: Netta Cedars, MD;  Location: [facility];  Service: Orthopedics;  Laterality: Right;  . TUBAL LIGATION      OB History    Gravida  4   Para  3   Term      Preterm      AB  1   Living  3     SAB  1   TAB      Ectopic      Multiple      Live Births              Allergies  Allergen Reactions  . Sulfonamide Derivatives Rash    SULFONAMIDES INCREASE SENSITIVITY   TO SUNLIGHT > MORE LIKELY TO SUNBURN SUNBURN-LIKE REDNESS    Social History   Socioeconomic  History  . Marital status: Married    Spouse name: Not on file  . Number of children: Not on file  . Years of education: Not on file  . Highest education level: Not on file  Occupational History  . Not on file  Social Needs  . Financial resource strain: Not on file  . Food insecurity:    Worry: Not on file    Inability: Not on file  . Transportation needs:    Medical: Not on file    Non-medical: Not on file  Tobacco Use  . Smoking status: Never Smoker  . Smokeless tobacco: Never Used  Substance and Sexual Activity  . Alcohol use: No  . Drug use: No  . Sexual activity: Yes    Birth control/protection: Surgical  Lifestyle  . Physical activity:    Days per week: Not on file    Minutes per session: Not on file  . Stress: Not on file  Relationships  . Social connections:    Talks on phone: Not on file    Gets together: Not on file    Attends religious service: Not on file    Active member of club or organization: Not on file    Attends meetings of clubs or organizations: Not on file    Relationship status: Not on file  Other Topics Concern  . Not on file  Social History Narrative  . Not on file    Family History  Problem Relation Age of Onset  . Dementia Mother   . Hypertension Mother   . Lupus Mother   . Fibromyalgia Sister   . Depression Sister   . Bipolar disorder Daughter   . Drug abuse Daughter   . Colon cancer Neg Hx     Medications:       Current Outpatient Medications:  .  Acetaminophen (TYLENOL ARTHRITIS EXT RELIEF PO), Take by mouth., Disp: , Rfl:  .  amLODipine (NORVASC) 5 MG tablet, Take 1 tablet (5 mg total) by mouth daily., Disp: 90 tablet, Rfl: 1 .  aspirin EC 81 MG tablet, Take 81 mg by mouth daily. WILL STOP PRIOR TO PROCEDURE, Disp: , Rfl:  .  cetirizine (ZYRTEC) 10 MG tablet, Take 10 mg by mouth daily as needed for allergies., Disp: , Rfl:  .  cholecalciferol (VITAMIN D) 1000 units tablet, Take 2,000 Units by mouth 3 (three) times a week.,  Disp: , Rfl:  .  clorazepate (TRANXENE) 7.5 MG tablet, Take 1 tablet (7.5 mg total) by mouth at bedtime., Disp: 30 tablet, Rfl: 2 .  DULoxetine (CYMBALTA) 60 MG capsule, Take 1 capsule (60 mg total) by mouth 2 (two) times daily., Disp: 180 capsule, Rfl: 0 .  levothyroxine (SYNTHROID, LEVOTHROID) 112 MCG tablet, TAKE 1 TABLET (112 MCG TOTAL) BY MOUTH DAILY., Disp: 90 tablet, Rfl: 1 .  omeprazole (PRILOSEC) 40 MG capsule, TAKE 1 CAPSULE (40 MG TOTAL) BY MOUTH DAILY., Disp: 90 capsule, Rfl: 1 .  RESTASIS MULTIDOSE 0.05 % ophthalmic emulsion, INSTILL 1 DROP INTO EACH EYE TWICE DAILY AS NEEDED FOR IRRITATION, Disp: , Rfl: 3 .  risedronate (ACTONEL) 150 MG tablet, Take 1 tablet (150 mg total) by mouth every 30 (thirty) days. with water, on an empty stomach, take nothing by mouth, Disp: 3 tablet, Rfl: 3 .  rosuvastatin (CRESTOR) 20 MG tablet, Take 1 tablet (20 mg total) by mouth daily., Disp: 90 tablet, Rfl: 1 .  triamterene-hydrochlorothiazide (MAXZIDE-25) 37.5-25 MG tablet, Take 1 tablet by mouth daily. As needed for swelling, Disp: 90 tablet, Rfl: 1 .  Vitamin D, Ergocalciferol, (DRISDOL) 1.25 MG (50000 UT) CAPS capsule, Take 1 capsule (50,000 Units total) by mouth every 7 (seven) days., Disp: 13 capsule, Rfl: 3 .  conjugated estrogens (PREMARIN) vaginal cream, Place 1 Applicatorful vaginally daily., Disp: 30 g, Rfl: 11  Objective Blood pressure (!) 141/94, pulse 73, height 5\' 4"  (1.626 m), weight 170 lb (77.1 kg).  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 Dyspareunia, female N94.10     Established relevant diagnosis(es):   Plan/Recommendations: Meds ordered this encounter  Medications  . conjugated estrogens (PREMARIN) vaginal cream    Sig: Place 1 Applicatorful vaginally daily.    Dispense:  30 g    Refill:  11    Labs or Scans Ordered: No orders of the defined types were placed in this encounter.   Management:: >estrogen vaginal cream  Follow up Return in about 3 months (around 07/09/2018) for Follow up, with Dr Elonda Husky.       All questions were answered.

## 2018-04-16 ENCOUNTER — Other Ambulatory Visit: Payer: Self-pay | Admitting: Obstetrics & Gynecology

## 2018-04-16 ENCOUNTER — Telehealth: Payer: Self-pay | Admitting: *Deleted

## 2018-04-16 ENCOUNTER — Telehealth: Payer: Self-pay | Admitting: Obstetrics & Gynecology

## 2018-04-16 MED ORDER — ESTRADIOL 0.1 MG/GM VA CREA: 2.0000 g | TOPICAL_CREAM | VAGINAL | 12 refills | Status: DC

## 2018-04-16 NOTE — Telephone Encounter (Signed)
Left message returning pt.'s call.

## 2018-04-16 NOTE — Telephone Encounter (Signed)
Pt states that premarin vaginal cream is too expensive and she requests that a different medication be sent in. Offered pt a discount card and she says that she has already tried that but it wont work since she is 27.. She states that she is also having some itching and wonders if it is because of the cream. Advised that I would send her request to a provider and let her know their recommendations. Pt verbalized understanding.

## 2018-04-16 NOTE — Telephone Encounter (Signed)
Pt returning your call please try again

## 2018-04-16 NOTE — Telephone Encounter (Signed)
I sent in a script for estrace to see if that is cheaper

## 2018-04-17 NOTE — Telephone Encounter (Signed)
LMOVM that a different Rx was sent to her pharmacy.

## 2018-04-18 ENCOUNTER — Telehealth: Payer: Self-pay | Admitting: Family Medicine

## 2018-04-18 ENCOUNTER — Other Ambulatory Visit: Payer: Self-pay | Admitting: Family Medicine

## 2018-04-18 MED ORDER — AMOXICILLIN 500 MG PO TABS: 2000.0000 mg | ORAL_TABLET | Freq: Once | ORAL | 3 refills | Status: DC | PRN

## 2018-04-18 NOTE — Telephone Encounter (Signed)
Patient aware.

## 2018-04-18 NOTE — Telephone Encounter (Signed)
I sent in the requested prescription 

## 2018-04-23 ENCOUNTER — Other Ambulatory Visit: Payer: Self-pay | Admitting: Family Medicine

## 2018-04-23 DIAGNOSIS — F418 Other specified anxiety disorders: Secondary | ICD-10-CM

## 2018-05-03 DIAGNOSIS — R69 Illness, unspecified: Secondary | ICD-10-CM | POA: Diagnosis not present

## 2018-07-09 ENCOUNTER — Ambulatory Visit: Payer: Self-pay | Admitting: Obstetrics & Gynecology

## 2018-07-09 ENCOUNTER — Encounter: Payer: Self-pay | Admitting: *Deleted

## 2018-07-10 ENCOUNTER — Ambulatory Visit: Payer: Medicare HMO | Admitting: Obstetrics & Gynecology

## 2018-07-10 ENCOUNTER — Other Ambulatory Visit: Payer: Self-pay

## 2018-07-10 ENCOUNTER — Encounter: Payer: Self-pay | Admitting: Obstetrics & Gynecology

## 2018-07-10 VITALS — BP 138/88 | HR 65 | Ht 64.0 in | Wt 171.5 lb

## 2018-07-10 DIAGNOSIS — N952 Postmenopausal atrophic vaginitis: Secondary | ICD-10-CM | POA: Diagnosis not present

## 2018-07-10 DIAGNOSIS — N941 Unspecified dyspareunia: Secondary | ICD-10-CM

## 2018-07-10 NOTE — Progress Notes (Signed)
Chief Complaint  Patient presents with  . Follow-up    on Estrace; it's helping!!!       63 y.o. [id number] No LMP recorded. Patient has had a hysterectomy. The current method of family planning is post menopausal status.  Outpatient Encounter Medications as of 07/10/2018  Medication Sig Note  . amLODipine (NORVASC) 5 MG tablet Take 1 tablet (5 mg total) by mouth daily.   Marland Kitchen amoxicillin (AMOXIL) 500 MG tablet Take 4 tablets (2,000 mg total) by mouth once as needed for up to 1 dose (one hour before dental procedure).   Marland Kitchen aspirin EC 81 MG tablet Take 81 mg by mouth daily. WILL STOP PRIOR TO PROCEDURE   . cetirizine (ZYRTEC) 10 MG tablet Take 10 mg by mouth daily as needed for allergies.   . clorazepate (TRANXENE) 7.5 MG tablet TAKE 1 TABLET (7.5 MG TOTAL) BY MOUTH AT BEDTIME.   . DULoxetine (CYMBALTA) 60 MG capsule Take 1 capsule (60 mg total) by mouth 2 (two) times daily.   Marland Kitchen estradiol (ESTRACE) 0.1 MG/GM vaginal cream Place 2 g vaginally as directed. Use 2 grams every other night   . levothyroxine (SYNTHROID, LEVOTHROID) 112 MCG tablet TAKE 1 TABLET (112 MCG TOTAL) BY MOUTH DAILY.   . meloxicam (MOBIC) 15 MG tablet Take 15 mg by mouth daily.   Marland Kitchen omeprazole (PRILOSEC) 40 MG capsule TAKE 1 CAPSULE (40 MG TOTAL) BY MOUTH DAILY.   . rosuvastatin (CRESTOR) 20 MG tablet Take 1 tablet (20 mg total) by mouth daily.   Marland Kitchen triamterene-hydrochlorothiazide (MAXZIDE-25) 37.5-25 MG tablet Take 1 tablet by mouth daily. As needed for swelling   . Vitamin D, Ergocalciferol, (DRISDOL) 1.25 MG (50000 UT) CAPS capsule Take 1 capsule (50,000 Units total) by mouth every 7 (seven) days.   . [DISCONTINUED] cholecalciferol (VITAMIN D) 1000 units tablet Take 2,000 Units by mouth once a week.    . [DISCONTINUED] Acetaminophen (TYLENOL ARTHRITIS EXT RELIEF PO) Take by mouth.   . [DISCONTINUED] conjugated estrogens (PREMARIN) vaginal cream Place 1 Applicatorful vaginally daily.   . [DISCONTINUED] RESTASIS MULTIDOSE  0.05 % ophthalmic emulsion INSTILL 1 DROP INTO EACH EYE TWICE DAILY AS NEEDED FOR IRRITATION 07/27/2015: Received from: External Pharmacy  . [DISCONTINUED] risedronate (ACTONEL) 150 MG tablet Take 1 tablet (150 mg total) by mouth every 30 (thirty) days. with water, on an empty stomach, take nothing by mouth    No facility-administered encounter medications on file as of 07/10/2018.     Subjective Pt has been on estrogen vaginal cream for atrophic vaginitis/dyspareunia for about 3 months Pt states she is doing very well no pain burning and good performance with intercourse, no issues Past Medical History:  Diagnosis Date  . Anxiety   . Arthritis   . Depression   . GERD (gastroesophageal reflux disease)   . History of hiatal hernia   . Hypercholesteremia   . Hypertension   . Hypothyroidism     Past Surgical History:  Procedure Laterality Date  . ABDOMINAL HYSTERECTOMY    . BREAST LUMPECTOMY     benign  . CARDIAC CATHETERIZATION     9 YRS AGO  . COLONOSCOPY N/A 05/30/2012   Procedure: COLONOSCOPY;  Surgeon: Rogene Houston, MD;  Location: [facility];  Service: Endoscopy;  Laterality: N/A;  830-moved to 1125 Ann to notify pt  . DILATION AND CURETTAGE OF UTERUS     X2  . JOINT REPLACEMENT Right    right hip  . Right Carpal  Tunnel Release    . 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   Procedure: LEFT TOTAL KNEE ARTHROPLASTY;  Surgeon: Gearlean Alf, MD;  Location: [facility];  Service: Orthopedics;  Laterality: Left;  . TOTAL SHOULDER ARTHROPLASTY Right 06/17/2016  . TOTAL SHOULDER ARTHROPLASTY Right 06/17/2016   Procedure: RIGHT TOTAL SHOULDER ARTHROPLASTY;  Surgeon: Netta Cedars, MD;  Location: [facility];  Service: Orthopedics;  Laterality: Right;  . TUBAL LIGATION      OB History    Gravida  4   Para  3   Term      Preterm      AB  1   Living  3     SAB  1   TAB      Ectopic      Multiple      Live Births              Allergies  Allergen Reactions  .  Sulfonamide Derivatives Rash    SULFONAMIDES INCREASE SENSITIVITY   TO SUNLIGHT > MORE LIKELY TO SUNBURN SUNBURN-LIKE REDNESS    Social History   Socioeconomic History  . Marital status: Married    Spouse name: Not on file  . Number of children: Not on file  . Years of education: Not on file  . Highest education level: Not on file  Occupational History  . Not on file  Social Needs  . Financial resource strain: Not on file  . Food insecurity:    Worry: Not on file    Inability: Not on file  . Transportation needs:    Medical: Not on file    Non-medical: Not on file  Tobacco Use  . Smoking status: Never Smoker  . Smokeless tobacco: Never Used  Substance and Sexual Activity  . Alcohol use: No  . Drug use: No  . Sexual activity: Yes    Birth control/protection: Surgical    Comment: hyst  Lifestyle  . Physical activity:    Days per week: Not on file    Minutes per session: Not on file  . Stress: Not on file  Relationships  . Social connections:    Talks on phone: Not on file    Gets together: Not on file    Attends religious service: Not on file    Active member of club or organization: Not on file    Attends meetings of clubs or organizations: Not on file    Relationship status: Not on file  Other Topics Concern  . Not on file  Social History Narrative  . Not on file    Family History  Problem Relation Age of Onset  . Dementia Mother   . Hypertension Mother   . Lupus Mother   . Fibromyalgia Sister   . Depression Sister   . Bipolar disorder Daughter   . Drug abuse Daughter   . Colon cancer Neg Hx     Medications:       Current Outpatient Medications:  .  amLODipine (NORVASC) 5 MG tablet, Take 1 tablet (5 mg total) by mouth daily., Disp: 90 tablet, Rfl: 1 .  amoxicillin (AMOXIL) 500 MG tablet, Take 4 tablets (2,000 mg total) by mouth once as needed for up to 1 dose (one hour before dental procedure)., Disp: 4 tablet, Rfl: 3 .  aspirin EC 81 MG tablet,  Take 81 mg by mouth daily. WILL STOP PRIOR TO PROCEDURE, Disp: , Rfl:  .  cetirizine (ZYRTEC) 10 MG tablet, Take 10 mg by mouth  daily as needed for allergies., Disp: , Rfl:  .  clorazepate (TRANXENE) 7.5 MG tablet, TAKE 1 TABLET (7.5 MG TOTAL) BY MOUTH AT BEDTIME., Disp: 30 tablet, Rfl: 2 .  DULoxetine (CYMBALTA) 60 MG capsule, Take 1 capsule (60 mg total) by mouth 2 (two) times daily., Disp: 180 capsule, Rfl: 0 .  estradiol (ESTRACE) 0.1 MG/GM vaginal cream, Place 2 g vaginally as directed. Use 2 grams every other night, Disp: 30 g, Rfl: 12 .  levothyroxine (SYNTHROID, LEVOTHROID) 112 MCG tablet, TAKE 1 TABLET (112 MCG TOTAL) BY MOUTH DAILY., Disp: 90 tablet, Rfl: 1 .  meloxicam (MOBIC) 15 MG tablet, Take 15 mg by mouth daily., Disp: , Rfl:  .  omeprazole (PRILOSEC) 40 MG capsule, TAKE 1 CAPSULE (40 MG TOTAL) BY MOUTH DAILY., Disp: 90 capsule, Rfl: 1 .  rosuvastatin (CRESTOR) 20 MG tablet, Take 1 tablet (20 mg total) by mouth daily., Disp: 90 tablet, Rfl: 1 .  triamterene-hydrochlorothiazide (MAXZIDE-25) 37.5-25 MG tablet, Take 1 tablet by mouth daily. As needed for swelling, Disp: 90 tablet, Rfl: 1 .  Vitamin D, Ergocalciferol, (DRISDOL) 1.25 MG (50000 UT) CAPS capsule, Take 1 capsule (50,000 Units total) by mouth every 7 (seven) days., Disp: 13 capsule, Rfl: 3  Objective Blood pressure 138/88, pulse 65, height 5\' 4"  (1.626 m), weight 171 lb 8 oz (77.8 kg).  General WDWN female NAD Vulva:  normal appearing vulva with no masses, tenderness or lesions Vagina:  normal mucosa, no discharge Cervix:   Uterus:   Adnexa: ovaries:,     Pertinent ROS No burning with urination, frequency or urgency No nausea, vomiting or diarrhea Nor fever chills or other constitutional symptoms   Labs or studies     Impression Diagnoses this Encounter::   ICD-10-CM   1. 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 Dyspareunia, female N94.10     Established relevant diagnosis(es):    Plan/Recommendations: No orders of the defined types were placed in this encounter.   Labs or Scans Ordered: No orders of the defined types were placed in this encounter.   Management:: Continue estrogen vaginal cream for her atrophy and dyspareunia symptoms  Follow up Return if symptoms worsen or fail to improve.       All questions were answered.

## 2018-07-11 ENCOUNTER — Other Ambulatory Visit: Payer: Self-pay | Admitting: Family Medicine

## 2018-07-11 DIAGNOSIS — F418 Other specified anxiety disorders: Secondary | ICD-10-CM

## 2018-08-06 ENCOUNTER — Other Ambulatory Visit: Payer: Self-pay | Admitting: Family Medicine

## 2018-08-09 ENCOUNTER — Other Ambulatory Visit: Payer: Self-pay | Admitting: Family Medicine

## 2018-08-09 DIAGNOSIS — E039 Hypothyroidism, unspecified: Secondary | ICD-10-CM

## 2018-08-10 NOTE — Telephone Encounter (Signed)
rx refill Last OV 02/12/18 Next appt 08/14/18 Last filled 02/12/18 #90 with 1 refill

## 2018-08-13 ENCOUNTER — Other Ambulatory Visit: Payer: Self-pay

## 2018-08-14 ENCOUNTER — Ambulatory Visit: Payer: BLUE CROSS/BLUE SHIELD | Admitting: Family Medicine

## 2018-08-14 ENCOUNTER — Encounter: Payer: Self-pay | Admitting: Family Medicine

## 2018-08-14 VITALS — BP 121/75 | HR 68 | Temp 99.0°F | Ht 64.0 in | Wt 175.2 lb

## 2018-08-14 DIAGNOSIS — M1712 Unilateral primary osteoarthritis, left knee: Secondary | ICD-10-CM

## 2018-08-14 DIAGNOSIS — I10 Essential (primary) hypertension: Secondary | ICD-10-CM

## 2018-08-14 DIAGNOSIS — R300 Dysuria: Secondary | ICD-10-CM | POA: Diagnosis not present

## 2018-08-14 DIAGNOSIS — E559 Vitamin D deficiency, unspecified: Secondary | ICD-10-CM | POA: Diagnosis not present

## 2018-08-14 DIAGNOSIS — E039 Hypothyroidism, unspecified: Secondary | ICD-10-CM | POA: Diagnosis not present

## 2018-08-14 DIAGNOSIS — K219 Gastro-esophageal reflux disease without esophagitis: Secondary | ICD-10-CM

## 2018-08-14 DIAGNOSIS — E785 Hyperlipidemia, unspecified: Secondary | ICD-10-CM | POA: Diagnosis not present

## 2018-08-14 DIAGNOSIS — F418 Other specified anxiety disorders: Secondary | ICD-10-CM | POA: Diagnosis not present

## 2018-08-14 DIAGNOSIS — R69 Illness, unspecified: Secondary | ICD-10-CM | POA: Diagnosis not present

## 2018-08-14 LAB — MICROSCOPIC EXAMINATION
Epithelial Cells (non renal): >10 /hpf — AB (ref 0–10)
RBC, Urine: NONE SEEN /hpf (ref 0–2)
Renal Epithel, UA: NONE SEEN /hpf

## 2018-08-14 LAB — URINALYSIS, COMPLETE
Bilirubin, UA: NEGATIVE
Glucose, UA: NEGATIVE
Ketones, UA: NEGATIVE
Leukocytes,UA: NEGATIVE
Nitrite, UA: NEGATIVE
Protein,UA: NEGATIVE
RBC, UA: NEGATIVE
Specific Gravity, UA: 1.02 (ref 1.005–1.030)
Urobilinogen, Ur: 0.2 mg/dL (ref 0.2–1.0)
pH, UA: 7.5 (ref 5.0–7.5)

## 2018-08-14 MED ORDER — ROSUVASTATIN CALCIUM 20 MG PO TABS: 20.0000 mg | ORAL_TABLET | Freq: Every day | ORAL | 1 refills | Status: DC

## 2018-08-14 MED ORDER — TRIAMTERENE-HCTZ 37.5-25 MG PO TABS: 1.0000 | ORAL_TABLET | Freq: Every day | ORAL | 1 refills | Status: DC

## 2018-08-14 MED ORDER — NITROFURANTOIN MONOHYD MACRO 100 MG PO CAPS: 100.0000 mg | ORAL_CAPSULE | Freq: Two times a day (BID) | ORAL | 0 refills | Status: DC

## 2018-08-14 MED ORDER — AMLODIPINE BESYLATE 5 MG PO TABS: 5.0000 mg | ORAL_TABLET | Freq: Every day | ORAL | 1 refills | Status: DC

## 2018-08-14 MED ORDER — OMEPRAZOLE 40 MG PO CPDR: DELAYED_RELEASE_CAPSULE | ORAL | 1 refills | Status: DC

## 2018-08-14 MED ORDER — LEVOTHYROXINE SODIUM 112 MCG PO TABS: ORAL_TABLET | ORAL | 1 refills | Status: DC

## 2018-08-14 MED ORDER — MELOXICAM 15 MG PO TABS: 15.0000 mg | ORAL_TABLET | Freq: Every day | ORAL | 1 refills | Status: DC

## 2018-08-14 MED ORDER — DULOXETINE HCL 60 MG PO CPEP: 60.0000 mg | ORAL_CAPSULE | Freq: Two times a day (BID) | ORAL | 0 refills | Status: DC

## 2018-08-14 NOTE — Progress Notes (Signed)
Subjective:  Patient ID: Leslie Robbins, female    DOB: 09-06-1955  Age: 63 y.o. MRN: [id number]  CC: Medical Management of Chronic Issues (6 month follow up of medical conditions) and Dysuria (x 1 week)   HPI Leslie Robbins presents for  follow-up of hypertension. Patient has no history of headache chest pain or shortness of breath or recent cough. Patient also denies symptoms of TIA such as focal numbness or weakness. Patient denies side effects from medication. States taking it regularly.  Patient presents for follow-up on  thyroid. The patient has a history of hypothyroidism for many years. It has been stable recently. Pt. denies any change in  voice, loss of hair, heat or cold intolerance. Energy level has been adequate to good. Patient denies constipation and diarrhea. No myxedema. Medication is as noted below. Verified that pt is taking it daily on an empty stomach. Well tolerated.  burning with urination and frequency for several days. Denies fever . No flank pain. No nausea, vomiting.  Patient tells me that she continues to take Mobic on a regular basis because she gets very stiff particularly in the mornings when she does not take it.  Additionally she can no longer afford Actonel.  She is taking vitamin D and calcium.  She has reduced her clorazepate overall but does take extra sometimes.  Her anxiety is based on her family situation.  She is raising HER-2 grandchildren in her home due to custodial issues with her daughter, the children's mother. History Leslie Robbins has a past medical history of Anxiety, Arthritis, Depression, GERD (gastroesophageal reflux disease), History of hiatal hernia, Hypercholesteremia, Hypertension, and Hypothyroidism.   She has a past surgical history that includes Abdominal hysterectomy; Tubal ligation; Right Carpal Tunnel Release; Breast lumpectomy; Colonoscopy (N/A, 05/30/2012); Dilation and curettage of uterus; Cardiac catheterization; Total knee arthroplasty (Left,  09/16/2013); Joint replacement (Right); Total shoulder arthroplasty (Right, 06/17/2016); and Total shoulder arthroplasty (Right, 06/17/2016).   Her family history includes Bipolar disorder in her daughter; Dementia in her mother; Depression in her sister; Drug abuse in her daughter; Fibromyalgia in her sister; Hypertension in her mother; Lupus in her mother.She reports that she has never smoked. She has never used smokeless tobacco. She reports that she does not drink alcohol or use drugs.  Current Outpatient Medications on File Prior to Visit  Medication Sig Dispense Refill  . aspirin EC 81 MG tablet Take 81 mg by mouth daily. WILL STOP PRIOR TO PROCEDURE    . Calcium Carb-Cholecalciferol (CALCIUM 600 + D) 600-200 MG-UNIT TABS Take by mouth.    . cetirizine (ZYRTEC) 10 MG tablet Take 10 mg by mouth daily as needed for allergies.    . clorazepate (TRANXENE) 7.5 MG tablet TAKE 1 TABLET (7.5 MG TOTAL) BY MOUTH AT BEDTIME. 30 tablet 2  . estradiol (ESTRACE) 0.1 MG/GM vaginal cream Place 2 g vaginally as directed. Use 2 grams every other night 30 g 12  . Vitamin D, Ergocalciferol, (DRISDOL) 1.25 MG (50000 UT) CAPS capsule Take 1 capsule (50,000 Units total) by mouth every 7 (seven) days. 13 capsule 3   No current facility-administered medications on file prior to visit.     ROS Review of Systems  Constitutional: Negative.   HENT: Negative for congestion.   Eyes: Negative for visual disturbance.  Respiratory: Negative for shortness of breath.   Cardiovascular: Negative for chest pain.  Gastrointestinal: Negative for abdominal pain, constipation, diarrhea, nausea and vomiting.  Genitourinary: Negative for difficulty urinating.  Musculoskeletal: Negative  for arthralgias and myalgias.  Neurological: Negative for headaches.  Psychiatric/Behavioral: Negative for agitation, confusion, decreased concentration and sleep disturbance. The patient is nervous/anxious.     Objective:  BP 121/75   Pulse  68   Temp 99 F (37.2 C) (Oral)   Ht 5' 4" (1.626 m)   Wt 175 lb 3.2 oz (79.5 kg)   BMI 30.07 kg/m   BP Readings from Last 3 Encounters:  08/14/18 121/75  07/10/18 138/88  04/10/18 (!) 141/94    Wt Readings from Last 3 Encounters:  08/14/18 175 lb 3.2 oz (79.5 kg)  07/10/18 171 lb 8 oz (77.8 kg)  04/10/18 170 lb (77.1 kg)     Physical Exam    Assessment & Plan:   Leslie Robbins was seen today for medical management of chronic issues and dysuria.  Diagnoses and all orders for this visit:  Hypothyroidism, unspecified type -     levothyroxine (SYNTHROID) 112 MCG tablet; TAKE 1 TABLET BY MOUTH EVERY DAY -     TSH -     T4, Free  Dysuria -     Urinalysis, Complete -     Urine Culture  Depression with anxiety -     DULoxetine (CYMBALTA) 60 MG capsule; Take 1 capsule (60 mg total) by mouth 2 (two) times daily.  Essential hypertension -     amLODipine (NORVASC) 5 MG tablet; Take 1 tablet (5 mg total) by mouth daily. -     CBC with Differential/Platelet -     CMP14+EGFR -     VITAMIN D 25 Hydroxy (Vit-D Deficiency, Fractures)  Hyperlipidemia with target LDL less than 100 -     rosuvastatin (CRESTOR) 20 MG tablet; Take 1 tablet (20 mg total) by mouth daily. -     Lipid panel  Other orders -     omeprazole (PRILOSEC) 40 MG capsule; TAKE 1 CAPSULE BY MOUTH EVERY DAY -     triamterene-hydrochlorothiazide (MAXZIDE-25) 37.5-25 MG tablet; Take 1 tablet by mouth daily. As needed for swelling -     meloxicam (MOBIC) 15 MG tablet; Take 1 tablet (15 mg total) by mouth daily. -     Microscopic Examination -     nitrofurantoin, macrocrystal-monohydrate, (MACROBID) 100 MG capsule; Take 1 capsule (100 mg total) by mouth 2 (two) times daily.   Allergies as of 08/14/2018      Reactions   Sulfonamide Derivatives Rash   SULFONAMIDES INCREASE SENSITIVITY   TO SUNLIGHT > MORE LIKELY TO SUNBURN SUNBURN-LIKE REDNESS      Medication List       Accurate as of August 14, 2018  5:27 PM. If you  have any questions, ask your nurse or doctor.        STOP taking these medications   amoxicillin 500 MG tablet Commonly known as:  AMOXIL Stopped by:  Claretta Fraise, MD     TAKE these medications   amLODipine 5 MG tablet Commonly known as:  NORVASC Take 1 tablet (5 mg total) by mouth daily.   aspirin EC 81 MG tablet Take 81 mg by mouth daily. WILL STOP PRIOR TO PROCEDURE   Calcium 600 + D 600-200 MG-UNIT Tabs Generic drug:  Calcium Carb-Cholecalciferol Take by mouth.   cetirizine 10 MG tablet Commonly known as:  ZYRTEC Take 10 mg by mouth daily as needed for allergies.   clorazepate 7.5 MG tablet Commonly known as:  TRANXENE TAKE 1 TABLET (7.5 MG TOTAL) BY MOUTH AT BEDTIME.   DULoxetine 60 MG capsule  Commonly known as:  CYMBALTA Take 1 capsule (60 mg total) by mouth 2 (two) times daily.   estradiol 0.1 MG/GM vaginal cream Commonly known as:  ESTRACE Place 2 g vaginally as directed. Use 2 grams every other night   levothyroxine 112 MCG tablet Commonly known as:  SYNTHROID TAKE 1 TABLET BY MOUTH EVERY DAY   meloxicam 15 MG tablet Commonly known as:  MOBIC Take 1 tablet (15 mg total) by mouth daily.   nitrofurantoin (macrocrystal-monohydrate) 100 MG capsule Commonly known as:  Macrobid Take 1 capsule (100 mg total) by mouth 2 (two) times daily. Started by:  Claretta Fraise, MD   omeprazole 40 MG capsule Commonly known as:  PRILOSEC TAKE 1 CAPSULE BY MOUTH EVERY DAY   rosuvastatin 20 MG tablet Commonly known as:  CRESTOR Take 1 tablet (20 mg total) by mouth daily.   triamterene-hydrochlorothiazide 37.5-25 MG tablet Commonly known as:  MAXZIDE-25 Take 1 tablet by mouth daily. As needed for swelling   Vitamin D (Ergocalciferol) 1.25 MG (50000 UT) Caps capsule Commonly known as:  DRISDOL Take 1 capsule (50,000 Units total) by mouth every 7 (seven) days.       Meds ordered this encounter  Medications  . DULoxetine (CYMBALTA) 60 MG capsule    Sig: Take 1  capsule (60 mg total) by mouth 2 (two) times daily.    Dispense:  180 capsule    Refill:  0  . amLODipine (NORVASC) 5 MG tablet    Sig: Take 1 tablet (5 mg total) by mouth daily.    Dispense:  90 tablet    Refill:  1  . rosuvastatin (CRESTOR) 20 MG tablet    Sig: Take 1 tablet (20 mg total) by mouth daily.    Dispense:  90 tablet    Refill:  1  . levothyroxine (SYNTHROID) 112 MCG tablet    Sig: TAKE 1 TABLET BY MOUTH EVERY DAY    Dispense:  90 tablet    Refill:  1  . omeprazole (PRILOSEC) 40 MG capsule    Sig: TAKE 1 CAPSULE BY MOUTH EVERY DAY    Dispense:  90 capsule    Refill:  1  . triamterene-hydrochlorothiazide (MAXZIDE-25) 37.5-25 MG tablet    Sig: Take 1 tablet by mouth daily. As needed for swelling    Dispense:  90 tablet    Refill:  1  . meloxicam (MOBIC) 15 MG tablet    Sig: Take 1 tablet (15 mg total) by mouth daily.    Dispense:  90 tablet    Refill:  1  . nitrofurantoin, macrocrystal-monohydrate, (MACROBID) 100 MG capsule    Sig: Take 1 capsule (100 mg total) by mouth 2 (two) times daily.    Dispense:  14 capsule    Refill:  0      Follow-up: Return in about 6 months (around 02/13/2019), or if symptoms worsen or fail to improve.  Claretta Fraise, M.D.

## 2018-08-15 LAB — LIPID PANEL
Chol/HDL Ratio: 2.1 ratio (ref 0.0–4.4)
Cholesterol, Total: 126 mg/dL (ref 100–199)
HDL: 59 mg/dL (ref >39)
LDL Calculated: 51 mg/dL (ref 0–99)
Triglycerides: 80 mg/dL (ref 0–149)
VLDL Cholesterol Cal: 16 mg/dL (ref 5–40)

## 2018-08-15 LAB — CMP14+EGFR
ALT: 19 IU/L (ref 0–32)
AST: 22 IU/L (ref 0–40)
Albumin/Globulin Ratio: 1.6 (ref 1.2–2.2)
Albumin: 4.3 g/dL (ref 3.8–4.8)
Alkaline Phosphatase: 133 IU/L — ABNORMAL HIGH (ref 39–117)
BUN/Creatinine Ratio: 20 (ref 12–28)
BUN: 22 mg/dL (ref 8–27)
Bilirubin Total: 0.5 mg/dL (ref 0.0–1.2)
CO2: 24 mmol/L (ref 20–29)
Calcium: 10.2 mg/dL (ref 8.7–10.3)
Chloride: 104 mmol/L (ref 96–106)
Creatinine, Ser: 1.08 mg/dL — ABNORMAL HIGH (ref 0.57–1.00)
GFR calc Af Amer: 62 mL/min/{1.73_m2} (ref >59)
GFR calc non Af Amer: 54 mL/min/{1.73_m2} — ABNORMAL LOW (ref >59)
Globulin, Total: 2.7 g/dL (ref 1.5–4.5)
Glucose: 93 mg/dL (ref 65–99)
Potassium: 4.7 mmol/L (ref 3.5–5.2)
Sodium: 143 mmol/L (ref 134–144)
Total Protein: 7 g/dL (ref 6.0–8.5)

## 2018-08-15 LAB — CBC WITH DIFFERENTIAL/PLATELET
Basophils Absolute: 0.1 10*3/uL (ref 0.0–0.2)
Basos: 1 %
EOS (ABSOLUTE): 0.2 10*3/uL (ref 0.0–0.4)
Eos: 5 %
Hematocrit: 40.4 % (ref 34.0–46.6)
Hemoglobin: 12.3 g/dL (ref 11.1–15.9)
Immature Grans (Abs): 0 10*3/uL (ref 0.0–0.1)
Immature Granulocytes: 0 %
Lymphocytes Absolute: 1 10*3/uL (ref 0.7–3.1)
Lymphs: 22 %
MCH: 25.5 pg — ABNORMAL LOW (ref 26.6–33.0)
MCHC: 30.4 g/dL — ABNORMAL LOW (ref 31.5–35.7)
MCV: 84 fL (ref 79–97)
Monocytes Absolute: 0.3 10*3/uL (ref 0.1–0.9)
Monocytes: 6 %
Neutrophils Absolute: 3.1 10*3/uL (ref 1.4–7.0)
Neutrophils: 66 %
Platelets: 260 10*3/uL (ref 150–450)
RBC: 4.82 x10E6/uL (ref 3.77–5.28)
RDW: 14.5 % (ref 11.7–15.4)
WBC: 4.7 10*3/uL (ref 3.4–10.8)

## 2018-08-15 LAB — T4, FREE: Free T4: 0.87 ng/dL (ref 0.82–1.77)

## 2018-08-15 LAB — URINE CULTURE

## 2018-08-15 LAB — TSH: TSH: 1.05 u[IU]/mL (ref 0.450–4.500)

## 2018-08-15 LAB — VITAMIN D 25 HYDROXY (VIT D DEFICIENCY, FRACTURES): Vit D, 25-Hydroxy: 56.9 ng/mL (ref 30.0–100.0)

## 2018-08-15 MED ORDER — VITAMIN D (ERGOCALCIFEROL) 1.25 MG (50000 UNIT) PO CAPS: 50000.0000 [IU] | ORAL_CAPSULE | ORAL | 3 refills | Status: DC

## 2018-08-15 NOTE — Addendum Note (Signed)
Addended by: Shelbie Ammons on: 08/15/2018 03:19 PM   Modules accepted: Orders

## 2018-08-22 DIAGNOSIS — R69 Illness, unspecified: Secondary | ICD-10-CM | POA: Diagnosis not present

## 2018-08-24 ENCOUNTER — Other Ambulatory Visit: Payer: Self-pay | Admitting: Family Medicine

## 2018-08-24 DIAGNOSIS — F418 Other specified anxiety disorders: Secondary | ICD-10-CM

## 2018-12-03 DIAGNOSIS — R69 Illness, unspecified: Secondary | ICD-10-CM | POA: Diagnosis not present

## 2018-12-26 ENCOUNTER — Other Ambulatory Visit: Payer: Self-pay | Admitting: Family Medicine

## 2018-12-26 DIAGNOSIS — F418 Other specified anxiety disorders: Secondary | ICD-10-CM

## 2019-01-03 ENCOUNTER — Other Ambulatory Visit: Payer: Self-pay | Admitting: Family Medicine

## 2019-01-03 DIAGNOSIS — F418 Other specified anxiety disorders: Secondary | ICD-10-CM

## 2019-02-13 ENCOUNTER — Other Ambulatory Visit: Payer: Self-pay

## 2019-02-13 ENCOUNTER — Ambulatory Visit (INDEPENDENT_AMBULATORY_CARE_PROVIDER_SITE_OTHER): Payer: Medicare HMO | Admitting: Family Medicine

## 2019-02-13 ENCOUNTER — Encounter: Payer: Self-pay | Admitting: Family Medicine

## 2019-02-13 VITALS — BP 138/82 | HR 68 | Temp 97.1°F | Ht 64.0 in | Wt 183.6 lb

## 2019-02-13 DIAGNOSIS — Z23 Encounter for immunization: Secondary | ICD-10-CM | POA: Diagnosis not present

## 2019-02-13 DIAGNOSIS — R69 Illness, unspecified: Secondary | ICD-10-CM | POA: Diagnosis not present

## 2019-02-13 DIAGNOSIS — I10 Essential (primary) hypertension: Secondary | ICD-10-CM | POA: Diagnosis not present

## 2019-02-13 DIAGNOSIS — E785 Hyperlipidemia, unspecified: Secondary | ICD-10-CM | POA: Diagnosis not present

## 2019-02-13 DIAGNOSIS — Z1159 Encounter for screening for other viral diseases: Secondary | ICD-10-CM

## 2019-02-13 DIAGNOSIS — F418 Other specified anxiety disorders: Secondary | ICD-10-CM | POA: Diagnosis not present

## 2019-02-13 DIAGNOSIS — E559 Vitamin D deficiency, unspecified: Secondary | ICD-10-CM

## 2019-02-13 DIAGNOSIS — E039 Hypothyroidism, unspecified: Secondary | ICD-10-CM

## 2019-02-13 MED ORDER — CLORAZEPATE DIPOTASSIUM 7.5 MG PO TABS: 7.5000 mg | ORAL_TABLET | Freq: Every day | ORAL | 5 refills | Status: DC

## 2019-02-14 LAB — CMP14+EGFR
ALT: 23 IU/L (ref 0–32)
AST: 25 IU/L (ref 0–40)
Albumin/Globulin Ratio: 1.7 (ref 1.2–2.2)
Albumin: 4.3 g/dL (ref 3.8–4.8)
Alkaline Phosphatase: 139 IU/L — ABNORMAL HIGH (ref 39–117)
BUN/Creatinine Ratio: 19 (ref 12–28)
BUN: 24 mg/dL (ref 8–27)
Bilirubin Total: 0.5 mg/dL (ref 0.0–1.2)
CO2: 24 mmol/L (ref 20–29)
Calcium: 10 mg/dL (ref 8.7–10.3)
Chloride: 105 mmol/L (ref 96–106)
Creatinine, Ser: 1.28 mg/dL — ABNORMAL HIGH (ref 0.57–1.00)
GFR calc Af Amer: 51 mL/min/{1.73_m2} — ABNORMAL LOW (ref >59)
GFR calc non Af Amer: 44 mL/min/{1.73_m2} — ABNORMAL LOW (ref >59)
Globulin, Total: 2.6 g/dL (ref 1.5–4.5)
Glucose: 90 mg/dL (ref 65–99)
Potassium: 4.2 mmol/L (ref 3.5–5.2)
Sodium: 143 mmol/L (ref 134–144)
Total Protein: 6.9 g/dL (ref 6.0–8.5)

## 2019-02-14 LAB — CBC WITH DIFFERENTIAL/PLATELET
Basophils Absolute: 0.1 10*3/uL (ref 0.0–0.2)
Basos: 1 %
EOS (ABSOLUTE): 0.3 10*3/uL (ref 0.0–0.4)
Eos: 6 %
Hematocrit: 40.1 % (ref 34.0–46.6)
Hemoglobin: 12.8 g/dL (ref 11.1–15.9)
Immature Grans (Abs): 0 10*3/uL (ref 0.0–0.1)
Immature Granulocytes: 0 %
Lymphocytes Absolute: 1.3 10*3/uL (ref 0.7–3.1)
Lymphs: 27 %
MCH: 26.9 pg (ref 26.6–33.0)
MCHC: 31.9 g/dL (ref 31.5–35.7)
MCV: 84 fL (ref 79–97)
Monocytes Absolute: 0.4 10*3/uL (ref 0.1–0.9)
Monocytes: 8 %
Neutrophils Absolute: 2.9 10*3/uL (ref 1.4–7.0)
Neutrophils: 58 %
Platelets: 209 10*3/uL (ref 150–450)
RBC: 4.76 x10E6/uL (ref 3.77–5.28)
RDW: 15.3 % (ref 11.7–15.4)
WBC: 5 10*3/uL (ref 3.4–10.8)

## 2019-02-14 LAB — LIPID PANEL
Chol/HDL Ratio: 2.1 ratio (ref 0.0–4.4)
Cholesterol, Total: 133 mg/dL (ref 100–199)
HDL: 62 mg/dL (ref >39)
LDL Chol Calc (NIH): 55 mg/dL (ref 0–99)
Triglycerides: 82 mg/dL (ref 0–149)
VLDL Cholesterol Cal: 16 mg/dL (ref 5–40)

## 2019-02-14 LAB — VITAMIN D 25 HYDROXY (VIT D DEFICIENCY, FRACTURES): Vit D, 25-Hydroxy: 42.3 ng/mL (ref 30.0–100.0)

## 2019-02-14 LAB — TSH+FREE T4
Free T4: 1.29 ng/dL (ref 0.82–1.77)
TSH: 1.24 u[IU]/mL (ref 0.450–4.500)

## 2019-02-14 LAB — HEPATITIS C ANTIBODY: Hep C Virus Ab: <0.1 s/co ratio (ref 0.0–0.9)

## 2019-02-14 NOTE — Progress Notes (Signed)
Hello Leslie Robbins,  Your lab result is normal and/or stable.Some minor variations that are not significant are commonly marked abnormal, but do not represent any medical problem for you.  Best regards, Jameel Quant, M.D.

## 2019-02-15 ENCOUNTER — Encounter: Payer: Self-pay | Admitting: Family Medicine

## 2019-02-15 NOTE — Progress Notes (Addendum)
Subjective:  Patient ID: Leslie Robbins, female    DOB: 12-27-1955  Age: 63 y.o. MRN: [id number]  CC: Medical Management of Chronic Issues   HPI Leslie Robbins presents for  follow-up on  thyroid. The patient has a history of hypothyroidism for many years. It has been stable recently. Pt. denies any change in  voice, loss of hair, heat or cold intolerance. Energy level has been adequate to good. Patient denies constipation and diarrhea. No myxedema. Medication is as noted below. Verified that pt is taking it daily on an empty stomach. Well tolerated.   presents for  follow-up of hypertension. Patient has no history of headache chest pain or shortness of breath or recent cough. Patient also denies symptoms of TIA such as focal numbness or weakness. Patient denies side effects from medication. States taking it regularly.   in for follow-up of elevated cholesterol. Doing well without complaints on current medication. Denies side effects of statin, crestor,  including myalgia and arthralgia and nausea. Currently no chest pain, shortness of breath or other cardiovascular related symptoms noted.  Taking tranxene for anxiety related to family situations, anxiety and depression.    Depression screen [facility] 2/9 02/13/2019 08/14/2018 04/10/2018  Decreased Interest 1 0 0  Down, Depressed, Hopeless 2 0 1  PHQ - 2 Score 3 0 1  Altered sleeping 0 - -  Tired, decreased energy 2 - -  Change in appetite 0 - -  Feeling bad or failure about yourself  0 - -  Trouble concentrating 2 - -  Moving slowly or fidgety/restless 0 - -  Suicidal thoughts 0 - -  PHQ-9 Score 7 - -  Difficult doing work/chores Not difficult at all - -  Some recent data might be hidden    History Leslie Robbins has a past medical history of Anxiety, Arthritis, Depression, GERD (gastroesophageal reflux disease), History of hiatal hernia, Hypercholesteremia, Hypertension, and Hypothyroidism.   Leslie Robbins has a past surgical history that includes Abdominal  hysterectomy; Tubal ligation; Right Carpal Tunnel Release; Breast lumpectomy; Colonoscopy (N/A, 05/30/2012); Dilation and curettage of uterus; Cardiac catheterization; Total knee arthroplasty (Left, 09/16/2013); Joint replacement (Right); Total shoulder arthroplasty (Right, 06/17/2016); and Total shoulder arthroplasty (Right, 06/17/2016).   Leslie Robbins family history includes Bipolar disorder in Leslie Robbins daughter; Dementia in Leslie Robbins mother; Depression in Leslie Robbins sister; Drug abuse in Leslie Robbins daughter; Fibromyalgia in Leslie Robbins sister; Hypertension in Leslie Robbins mother; Lupus in Leslie Robbins mother.Leslie Robbins reports that Leslie Robbins has never smoked. Leslie Robbins has never used smokeless tobacco. Leslie Robbins reports that Leslie Robbins does not drink alcohol or use drugs.    ROS Review of Systems  Constitutional: Negative.   HENT: Negative for congestion.   Eyes: Negative for visual disturbance.  Respiratory: Negative for shortness of breath.   Cardiovascular: Negative for chest pain.  Gastrointestinal: Negative for abdominal pain, constipation, diarrhea, nausea and vomiting.  Genitourinary: Negative for difficulty urinating.  Musculoskeletal: Negative for arthralgias and myalgias.  Neurological: Negative for headaches.  Psychiatric/Behavioral: Negative for sleep disturbance.    Objective:  BP 138/82   Pulse 68   Temp (!) 97.1 F (36.2 C) (Temporal)   Ht 5\' 4"  (1.626 m)   Wt 183 lb 9.6 oz (83.3 kg)   SpO2 98%   BMI 31.51 kg/m   BP Readings from Last 3 Encounters:  02/13/19 138/82  08/14/18 121/75  07/10/18 138/88    Wt Readings from Last 3 Encounters:  02/13/19 183 lb 9.6 oz (83.3 kg)  08/14/18 175 lb 3.2 oz (79.5 kg)  07/10/18  171 lb 8 oz (77.8 kg)     Physical Exam Constitutional:      General: Leslie Robbins is not in acute distress.    Appearance: Leslie Robbins is well-developed.  HENT:     Head: Normocephalic and atraumatic.     Right Ear: External ear normal.     Left Ear: External ear normal.     Nose: Nose normal.  Eyes:     Conjunctiva/sclera: Conjunctivae normal.      Pupils: Pupils are equal, round, and reactive to light.  Neck:     Thyroid: No thyromegaly.  Cardiovascular:     Rate and Rhythm: Normal rate and regular rhythm.     Heart sounds: Normal heart sounds. No murmur.  Pulmonary:     Effort: Pulmonary effort is normal. No respiratory distress.     Breath sounds: Normal breath sounds. No wheezing or rales.  Abdominal:     General: Bowel sounds are normal. There is no distension.     Palpations: Abdomen is soft.     Tenderness: There is no abdominal tenderness.  Musculoskeletal:     Cervical back: Normal range of motion and neck supple.  Lymphadenopathy:     Cervical: No cervical adenopathy.  Skin:    General: Skin is warm and dry.  Neurological:     Mental Status: Leslie Robbins is alert and oriented to person, place, and time.     Deep Tendon Reflexes: Reflexes are normal and symmetric.  Psychiatric:        Behavior: Behavior normal.        Thought Content: Thought content normal.        Judgment: Judgment normal.       Assessment & Plan:   Leslie Robbins was seen today for medical management of chronic issues.  Diagnoses and all orders for this visit:  Hyperlipidemia with target LDL less than 100 -     Lipid -     CBC -     CMP  Depression with anxiety -     clorazepate (TRANXENE) 7.5 MG tablet; Take 1 tablet (7.5 mg total) by mouth at bedtime. -     CBC -     CMP  Hypothyroidism, unspecified type -     TSH + free T4 -     CBC -     CMP  Essential hypertension -     CBC -     CMP  Need for hepatitis C screening test -     Hepatitis C antibody  Vitamin D deficiency -     VITAMIN D -     CBC -     CMP  Other orders -     Pneumococcal conjugate vaccine 13-valent       Leslie Robbins nitrofurantoin (macrocrystal-monohydrate). Leslie am also having Leslie Robbins maintain Leslie Robbins aspirin EC, cetirizine, estradiol, Calcium Carb-Cholecalciferol, amLODipine, rosuvastatin, levothyroxine, omeprazole,  triamterene-hydrochlorothiazide, meloxicam, Vitamin D (Ergocalciferol), DULoxetine, and clorazepate.  Allergies as of 02/13/2019      Reactions   Sulfonamide Derivatives Rash   SULFONAMIDES INCREASE SENSITIVITY   TO SUNLIGHT > MORE LIKELY TO SUNBURN SUNBURN-LIKE REDNESS      Medication List       Accurate as of February 13, 2019 11:59 PM. If you have any questions, ask your nurse or doctor.        STOP taking these medications   nitrofurantoin (macrocrystal-monohydrate) 100 MG capsule Commonly known as: Macrobid Stopped by: Claretta Fraise, MD     TAKE  these medications   amLODipine 5 MG tablet Commonly known as: NORVASC Take 1 tablet (5 mg total) by mouth daily.   aspirin EC 81 MG tablet Take 81 mg by mouth daily. WILL STOP PRIOR TO PROCEDURE   Calcium 600 + D 600-200 MG-UNIT Tabs Generic drug: Calcium Carb-Cholecalciferol Take by mouth.   cetirizine 10 MG tablet Commonly known as: ZYRTEC Take 10 mg by mouth daily as needed for allergies.   clorazepate 7.5 MG tablet Commonly known as: TRANXENE Take 1 tablet (7.5 mg total) by mouth at bedtime.   DULoxetine 60 MG capsule Commonly known as: CYMBALTA TAKE 1 CAPSULE (60 MG TOTAL) BY MOUTH 2 (TWO) TIMES DAILY.   estradiol 0.1 MG/GM vaginal cream Commonly known as: ESTRACE Place 2 g vaginally as directed. Use 2 grams every other night   levothyroxine 112 MCG tablet Commonly known as: SYNTHROID TAKE 1 TABLET BY MOUTH EVERY DAY   meloxicam 15 MG tablet Commonly known as: MOBIC Take 1 tablet (15 mg total) by mouth daily.   omeprazole 40 MG capsule Commonly known as: PRILOSEC TAKE 1 CAPSULE BY MOUTH EVERY DAY   rosuvastatin 20 MG tablet Commonly known as: CRESTOR Take 1 tablet (20 mg total) by mouth daily.   triamterene-hydrochlorothiazide 37.5-25 MG tablet Commonly known as: MAXZIDE-25 Take 1 tablet by mouth daily. As needed for swelling   Vitamin D (Ergocalciferol) 1.25 MG (50000 UT) Caps capsule Commonly  known as: DRISDOL Take 1 capsule (50,000 Units total) by mouth every 7 (seven) days.        Follow-up: No follow-ups on file.  Claretta Fraise, M.D.

## 2019-02-21 ENCOUNTER — Other Ambulatory Visit: Payer: Self-pay | Admitting: Family Medicine

## 2019-02-21 DIAGNOSIS — I10 Essential (primary) hypertension: Secondary | ICD-10-CM

## 2019-02-22 ENCOUNTER — Other Ambulatory Visit: Payer: Self-pay

## 2019-02-25 ENCOUNTER — Ambulatory Visit (INDEPENDENT_AMBULATORY_CARE_PROVIDER_SITE_OTHER): Payer: Medicare HMO | Admitting: Family Medicine

## 2019-02-25 ENCOUNTER — Encounter: Payer: Self-pay | Admitting: Family Medicine

## 2019-02-25 ENCOUNTER — Other Ambulatory Visit: Payer: Self-pay

## 2019-02-25 VITALS — BP 104/72 | HR 71 | Temp 98.0°F | Resp 20 | Ht 64.0 in | Wt 185.0 lb

## 2019-02-25 DIAGNOSIS — Z0000 Encounter for general adult medical examination without abnormal findings: Secondary | ICD-10-CM | POA: Diagnosis not present

## 2019-02-25 NOTE — Progress Notes (Signed)
WELCOME TO MEDICARE (IPPE) VISIT  02/25/2019  Leslie Robbins DOB:1955/09/16     [id number]  I explained that today's visit was for the purpose of health promotion and disease detection, as well as an introduction to Medicare and it's covered benefits.  I explained that no labs or other services would be performed today. If labs or other services are determined to be necessary the appropriate orders/referrals will be arranged for these to be done at a future date.  Leslie Robbins is a 63 y.o. year old female primary care patient of [facility]. Crying thus AM. Missed med. Thinking about her daughter. Caring for 3 grand kids. Arthritis is stable. Better than it used to be. Able to use meloxicam prn during warm seasons. Daily in winter. Reflux under good control. Sometimes spicy food can cause symptoms.    MEDICAL AND SOCIAL HISTORY Past Medical History Past Medical History:  Diagnosis Date  . Anxiety   . Arthritis   . Depression   . GERD (gastroesophageal reflux disease)   . History of hiatal hernia   . Hypercholesteremia   . Hypertension   . Hypothyroidism     Surgical History Past Surgical History:  Procedure Laterality Date  . ABDOMINAL HYSTERECTOMY    . BREAST LUMPECTOMY     benign  . CARDIAC CATHETERIZATION     9 YRS AGO  . COLONOSCOPY N/A 05/30/2012   Procedure: COLONOSCOPY;  Surgeon: Rogene Houston, MD;  Location: [facility];  Service: Endoscopy;  Laterality: N/A;  830-moved to 1125 Ann to notify pt  . DILATION AND CURETTAGE OF UTERUS     X2  . JOINT REPLACEMENT Right    right hip  . Right Carpal Tunnel Release    . 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   Procedure: LEFT TOTAL KNEE ARTHROPLASTY;  Surgeon: Gearlean Alf, MD;  Location: [facility];  Service: Orthopedics;  Laterality: Left;  . TOTAL SHOULDER ARTHROPLASTY Right 06/17/2016  . TOTAL SHOULDER ARTHROPLASTY Right 06/17/2016   Procedure: RIGHT TOTAL SHOULDER ARTHROPLASTY;  Surgeon: Netta Cedars, MD;  Location: [place];  Service: Orthopedics;  Laterality: Right;  . TUBAL LIGATION      Family History Family History  Problem Relation Age of Onset  . Dementia Mother   . Hypertension Mother   . Lupus Mother   . Fibromyalgia Sister   . Depression Sister   . Bipolar disorder Daughter   . Drug abuse Daughter   . Colon cancer Neg Hx     Social History Social History   Socioeconomic History  . Marital status: Married    Spouse name: Legrand Como  . Number of children: Not on file  . Years of education: Not on file  . Highest education level: Not on file  Occupational History  . Not on file  Tobacco Use  . Smoking status: Never Smoker  . Smokeless tobacco: Never Used  Substance and Sexual Activity  . Alcohol use: No  . Drug use: No  . Sexual activity: Yes    Birth control/protection: Surgical    Comment: hyst  Other Topics Concern  . Not on file  Social History Narrative  . Not on file   Social Determinants of Health   Financial Resource Strain:   . Difficulty of Paying Living Expenses: Not on file  Food Insecurity:   . Worried About Charity fundraiser in the Last Year: Not on file  . Ran Out of Food in the Last Year: Not on file  Transportation Needs:   . Film/video editor (Medical): Not on file  . Lack of Transportation (Non-Medical): Not on file  Physical Activity:   . Days of Exercise per Week: Not on file  . Minutes of Exercise per Session: Not on file  Stress:   . Feeling of Stress : Not on file  Social Connections:   . Frequency of Communication with Friends and Family: Not on file  . Frequency of Social Gatherings with Friends and Family: Not on file  . Attends Religious Services: Not on file  . Active Member of Clubs or Organizations: Not on file  . Attends Archivist Meetings: Not on file  . Marital Status: Not on file    Current Medications & Allergies Outpatient Encounter Medications as of 02/25/2019  Medication Sig  . amLODipine (NORVASC) 5 MG  tablet Take 1 tablet (5 mg total) by mouth daily.  Marland Kitchen aspirin EC 81 MG tablet Take 81 mg by mouth daily. WILL STOP PRIOR TO PROCEDURE  . Calcium Carb-Cholecalciferol (CALCIUM 600 + D) 600-200 MG-UNIT TABS Take by mouth.  . cetirizine (ZYRTEC) 10 MG tablet Take 10 mg by mouth daily as needed for allergies.  . clorazepate (TRANXENE) 7.5 MG tablet Take 1 tablet (7.5 mg total) by mouth at bedtime.  . DULoxetine (CYMBALTA) 60 MG capsule TAKE 1 CAPSULE (60 MG TOTAL) BY MOUTH 2 (TWO) TIMES DAILY.  Marland Kitchen estradiol (ESTRACE) 0.1 MG/GM vaginal cream Place 2 g vaginally as directed. Use 2 grams every other night  . levothyroxine (SYNTHROID) 112 MCG tablet TAKE 1 TABLET BY MOUTH EVERY DAY  . meloxicam (MOBIC) 15 MG tablet Take 1 tablet (15 mg total) by mouth daily.  Marland Kitchen omeprazole (PRILOSEC) 40 MG capsule TAKE 1 CAPSULE BY MOUTH EVERY DAY  . rosuvastatin (CRESTOR) 20 MG tablet Take 1 tablet (20 mg total) by mouth daily.  Marland Kitchen triamterene-hydrochlorothiazide (MAXZIDE-25) 37.5-25 MG tablet TAKE 1 TABLET BY MOUTH DAILY. AS NEEDED FOR SWELLING  . Vitamin D, Ergocalciferol, (DRISDOL) 1.25 MG (50000 UT) CAPS capsule Take 1 capsule (50,000 Units total) by mouth every 7 (seven) days.   No facility-administered encounter medications on file as of 02/25/2019.   Sulfonamide derivatives  Diet & Physical Activity     Pt consumes 3 meals a day and 1 snacks a day.  The patient feels that he mostly follow a Regular diet.   DEPRESSION SCREENING PHQ 2/9 Scores 02/25/2019 02/13/2019 08/14/2018 04/10/2018 03/01/2018 02/12/2018 11/07/2017  PHQ - 2 Score 0 3 0 1 1 1 2   PHQ- 9 Score - 7 - - - - 7    GAD 7 : Generalized Anxiety Score 02/25/2019 02/13/2019 11/07/2017 09/26/2017  Nervous, Anxious, on Edge 1 2 2 3   Control/stop worrying 1 0 2 3  Worry too much - different things 1 1 2 3   Trouble relaxing 0 1 2 3   Restless 0 0 2 3  Easily annoyed or irritable 1 1 1 2   Afraid - awful might happen 0 1 2 3   Total GAD 7 Score 4 6 13 20     Anxiety Difficulty - Not difficult at all - Very difficult     FUNCTIONAL ABILITY & LEVEL OF SAFETY Fall Risk Fall Risk  02/25/2019 02/13/2019 07/10/2018 04/10/2018 02/12/2018  Falls in the past year? 1 0 0 0 0  Number falls in past yr: 0 0 - - -  Injury with Fall? 0 - - - -  Risk for fall due to : - - - - -  Activities of Daily Living No flowsheet data found.  Cognitive Function MMSE - Mini Mental State Exam 02/25/2019  Orientation to time 5  Orientation to Place 5  Registration 3  Attention/ Calculation 5  Recall 2  Language- name 2 objects 2  Language- repeat 1  Language- follow 3 step command 3  Language- read & follow direction 1  Write a sentence 1  Copy design 1  Total score 29       Normal Cognitive Function Screening today: Yes   EXAM (physical exam is not indicated for this visit type) Today's Vitals   02/25/19 1105  BP: 104/72  Pulse: 71  Resp: 20  Temp: 98 F (36.7 C)  TempSrc: Temporal  SpO2: 97%  Weight: 185 lb (83.9 kg)  Height: 5\' 4"  (1.626 m)   Body mass index is 31.76 kg/m.  Visual Acuity Screen: followed by ophthalmology. Due for follow up for glaucoma screening and visual acuity exams. Encouraged her to follow through with that right away.    No additional physical exam required or indicated today.  Health Maintenance Health Maintenance  Topic Date Due  . PAP SMEAR-Modifier  01/22/2018  . HIV Screening  02/13/2020 (Originally 03/12/1970)  . PNA vac Low Risk Adult (2 of 2 - PPSV23) 02/13/2020  . MAMMOGRAM  03/02/2020  . TETANUS/TDAP  12/31/2021  . COLONOSCOPY  05/31/2022  . INFLUENZA VACCINE  Completed  . DEXA SCAN  Completed  . Hepatitis C Screening  Completed     END OF LIFE PLANNING Advanced directives and power of attorney information specific to the patient were discussed.    Advanced Directives 07/14/2016 07/05/2016 06/17/2016 06/13/2016 04/21/2016 09/16/2013 09/05/2013  Does Patient Have a Medical Advance Directive? No No No No  No Patient does not have advance directive;Patient would like information Patient does not have advance directive;Patient would like information  Would patient like information on creating a medical advance directive? No - Patient declined No - Patient declined No - Patient declined No - Patient declined - Advance directive packet given Advance directive packet given  Pre-existing out of facility DNR order (yellow form or pink MOST form) - - - - - No -     Education, counseling, and referrals based on the information obtained/reviewed today: Pt. Should schedule pap, mammogram. Consider Shingrix.   Education, counseling, and referral for other preventive services: Written checklist was completed and given to pt for obtaining, as appropriate, the other preventive services that are covered as separate Medicare Part B benefits. Possible services that were reviewed with pt are:  -annual wellness visit (AWV) -Bone mass measurements -Cardiovascular screening blood tests -Colorectal cancer screening -Counseling to prevent tobacco use -Diabetes screening tests -Diabetes self-management training (DSMT) -Glaucoma screening -HIV screening -Medical nutrition therapy -Prostate cancer screening -Seasonal influenza, pneumococcal, and Hep B vaccines -screening mammography -screening pap tests and pelvic exam -ultrasound screening for AAA  Of the above listed services, mammogram, glaucoma screening, pap referred .  Patient did not have an additional complaint/problem that was discussed and evaluated today.  Patient was given opportunity to ask any additional questions regarding Medicare and covered benefits.  Patient was informed that Medicare does not provide coverage for routine physical exams.  I answered all questions to the best of my ability today.    Follow up: @FOLLOWUP    @SIGNATURE @

## 2019-03-11 DIAGNOSIS — R69 Illness, unspecified: Secondary | ICD-10-CM | POA: Diagnosis not present

## 2019-03-18 ENCOUNTER — Other Ambulatory Visit: Payer: Self-pay | Admitting: Family Medicine

## 2019-03-18 DIAGNOSIS — I10 Essential (primary) hypertension: Secondary | ICD-10-CM

## 2019-04-03 DIAGNOSIS — Z1231 Encounter for screening mammogram for malignant neoplasm of breast: Secondary | ICD-10-CM | POA: Diagnosis not present

## 2019-04-19 ENCOUNTER — Other Ambulatory Visit: Payer: Self-pay | Admitting: Family Medicine

## 2019-04-19 ENCOUNTER — Other Ambulatory Visit: Payer: Self-pay | Admitting: Obstetrics & Gynecology

## 2019-04-19 DIAGNOSIS — K219 Gastro-esophageal reflux disease without esophagitis: Secondary | ICD-10-CM

## 2019-04-24 DIAGNOSIS — R69 Illness, unspecified: Secondary | ICD-10-CM | POA: Diagnosis not present

## 2019-05-11 ENCOUNTER — Other Ambulatory Visit: Payer: Self-pay | Admitting: Family Medicine

## 2019-05-14 ENCOUNTER — Telehealth: Payer: Self-pay | Admitting: Family Medicine

## 2019-05-14 ENCOUNTER — Other Ambulatory Visit: Payer: Self-pay | Admitting: Family Medicine

## 2019-05-14 MED ORDER — AMOXICILLIN 500 MG PO TABS: 2000.0000 mg | ORAL_TABLET | Freq: Once | ORAL | 3 refills | Status: AC

## 2019-05-14 NOTE — Telephone Encounter (Signed)
I sent in the requested prescription 

## 2019-05-14 NOTE — Telephone Encounter (Signed)
  Medication Request  05/14/2019  What is the name of the medication? Needs antibiotic before she goes to dentist and may need a tooth pulled  Have you contacted your pharmacy to request a refill? No needs new rx  Which pharmacy would you like this sent to? [facility]   Patient notified that their request is being sent to the clinical staff for review and that they should receive a call once it is complete. If they do not receive a call within 24 hours they can check with their pharmacy or our office.

## 2019-05-16 ENCOUNTER — Other Ambulatory Visit: Payer: Self-pay | Admitting: Family Medicine

## 2019-05-16 DIAGNOSIS — F418 Other specified anxiety disorders: Secondary | ICD-10-CM

## 2019-05-26 ENCOUNTER — Other Ambulatory Visit: Payer: Self-pay | Admitting: Family Medicine

## 2019-05-26 DIAGNOSIS — E039 Hypothyroidism, unspecified: Secondary | ICD-10-CM

## 2019-05-26 DIAGNOSIS — M1712 Unilateral primary osteoarthritis, left knee: Secondary | ICD-10-CM

## 2019-06-18 ENCOUNTER — Other Ambulatory Visit: Payer: Self-pay | Admitting: Family Medicine

## 2019-06-18 DIAGNOSIS — F418 Other specified anxiety disorders: Secondary | ICD-10-CM

## 2019-06-18 NOTE — Telephone Encounter (Signed)
Next OV 08/14/19

## 2019-06-25 ENCOUNTER — Telehealth: Payer: Self-pay | Admitting: Family Medicine

## 2019-06-25 ENCOUNTER — Other Ambulatory Visit: Payer: Self-pay | Admitting: Family Medicine

## 2019-06-25 MED ORDER — AMOXICILLIN 500 MG PO TABS: 2000.0000 mg | ORAL_TABLET | Freq: Once | ORAL | 3 refills | Status: DC | PRN

## 2019-06-25 NOTE — Telephone Encounter (Signed)
Please contact the patient - I sent in the medicaine

## 2019-06-25 NOTE — Telephone Encounter (Signed)
  Prescription Request  06/25/2019  What is the name of the medication or equipment? PT NEEDS AMMOXICILLIAN CALLED IN FOR HER DENTAL PROCEDURE TOMORROW. SHE HAS TO TAKE 4 TABLETS AN HOUR BEFORE PROCEDURE. HER APPT IS AT 10:00 IN THE MORNING  Have you contacted your pharmacy to request a refill? (if applicable) NO  Which pharmacy would you like this sent to? [facility]   Patient notified that their request is being sent to the clinical staff for review and that they should receive a response within 2 business days.

## 2019-06-26 DIAGNOSIS — R69 Illness, unspecified: Secondary | ICD-10-CM | POA: Diagnosis not present

## 2019-07-03 NOTE — Telephone Encounter (Signed)
Patient was aware.

## 2019-08-14 ENCOUNTER — Encounter: Payer: Self-pay | Admitting: Family Medicine

## 2019-08-14 ENCOUNTER — Other Ambulatory Visit: Payer: Self-pay

## 2019-08-14 ENCOUNTER — Ambulatory Visit (INDEPENDENT_AMBULATORY_CARE_PROVIDER_SITE_OTHER): Payer: Medicare HMO | Admitting: Family Medicine

## 2019-08-14 VITALS — BP 130/80 | HR 72 | Temp 97.8°F | Resp 20 | Ht 64.0 in | Wt 193.1 lb

## 2019-08-14 DIAGNOSIS — E039 Hypothyroidism, unspecified: Secondary | ICD-10-CM | POA: Diagnosis not present

## 2019-08-14 DIAGNOSIS — E785 Hyperlipidemia, unspecified: Secondary | ICD-10-CM | POA: Diagnosis not present

## 2019-08-14 DIAGNOSIS — R69 Illness, unspecified: Secondary | ICD-10-CM | POA: Diagnosis not present

## 2019-08-14 DIAGNOSIS — F418 Other specified anxiety disorders: Secondary | ICD-10-CM

## 2019-08-14 DIAGNOSIS — F411 Generalized anxiety disorder: Secondary | ICD-10-CM

## 2019-08-14 DIAGNOSIS — I10 Essential (primary) hypertension: Secondary | ICD-10-CM | POA: Diagnosis not present

## 2019-08-14 DIAGNOSIS — R109 Unspecified abdominal pain: Secondary | ICD-10-CM | POA: Diagnosis not present

## 2019-08-14 LAB — URINALYSIS, COMPLETE
Bilirubin, UA: NEGATIVE
Glucose, UA: NEGATIVE
Ketones, UA: NEGATIVE
Leukocytes,UA: NEGATIVE
Nitrite, UA: NEGATIVE
Protein,UA: NEGATIVE
Specific Gravity, UA: 1.025 (ref 1.005–1.030)
Urobilinogen, Ur: 1 mg/dL (ref 0.2–1.0)
pH, UA: 6 (ref 5.0–7.5)

## 2019-08-14 LAB — MICROSCOPIC EXAMINATION
Epithelial Cells (non renal): >10 /hpf — AB (ref 0–10)
Renal Epithel, UA: NONE SEEN /hpf

## 2019-08-14 MED ORDER — CLORAZEPATE DIPOTASSIUM 3.75 MG PO TABS: 3.7500 mg | ORAL_TABLET | Freq: Every day | ORAL | 1 refills | Status: DC | PRN

## 2019-08-14 MED ORDER — QUETIAPINE FUMARATE 50 MG PO TABS: 50.0000 mg | ORAL_TABLET | Freq: Every day | ORAL | 1 refills | Status: DC

## 2019-08-14 NOTE — Progress Notes (Signed)
Subjective:  Patient ID: Leslie Robbins, female    DOB: January 14, 1956  Age: 64 y.o. MRN: [id number]  CC: Medical Management of Chronic Issues   HPI CERA RORKE presents for follow-up on her anxiety depression.  She says this primarily is situational.  And that she is raising her daughter's children who are ages 69 7 and 78.  Her daughter did call recently and this may help her go into detox and hopefully to rehab from there.  She also has a husband who is fighting alcoholism and refuses to help their daughter not realizing that they are fighting essentially the same illness.  He says that he is different here or with other people when he was at home.  He is mentally very difficult there for her.  She has had to walk out on him 2 different times recently.  Patient is also here for follow-up on her thyroid condition.  She thinks it is off.  She used to take 150 mcg and felt much better at that dose than she does at her current dose of 112 mcg.  She has decreased energy, she is constipated, she has some dry skin, her throat seems froggy to her.  Of most concern is that she is losing words that is she will think a word but say a different word.    Depression screen [facility] 2/9 08/14/2019 02/25/2019 02/13/2019  Decreased Interest 0 0 1  Down, Depressed, Hopeless 1 0 2  PHQ - 2 Score 1 0 3  Altered sleeping - - 0  Tired, decreased energy - - 2  Change in appetite - - 0  Feeling bad or failure about yourself  - - 0  Trouble concentrating - - 2  Moving slowly or fidgety/restless - - 0  Suicidal thoughts - - 0  PHQ-9 Score - - 7  Difficult doing work/chores - - Not difficult at all  Some recent data might be hidden    History Jossette has a past medical history of Anxiety, Arthritis, Depression, GERD (gastroesophageal reflux disease), History of hiatal hernia, Hypercholesteremia, Hypertension, and Hypothyroidism.   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   Her family history includes Bipolar disorder in her daughter; Dementia in her mother; Depression in her sister; Drug abuse in her daughter; Fibromyalgia in her sister; Hypertension in her mother; Lupus in her mother.She reports that she has never smoked. She has never used smokeless tobacco. She reports that she does not drink alcohol or use drugs.    ROS Review of Systems  Constitutional: Negative.   HENT: Negative.   Eyes: Negative for visual disturbance.  Respiratory: Negative for shortness of breath.   Cardiovascular: Negative for chest pain.  Gastrointestinal: Negative for abdominal pain.  Genitourinary: Positive for flank pain (Mild bilateral and of recent onset). Negative for dysuria.  Musculoskeletal: Negative for arthralgias.  Psychiatric/Behavioral: Positive for agitation and dysphoric mood. The patient is nervous/anxious.     Objective:  BP 130/80   Pulse 72   Temp 97.8 F (36.6 C) (Temporal)   Resp 20   Ht '5\' 4"'$  (1.626 m)   Wt 193 lb 2 oz (87.6 kg)   SpO2 95%   BMI 33.15 kg/m   BP Readings from Last 3 Encounters:  08/14/19 130/80  02/25/19 104/72  02/13/19 138/82    Wt Readings from Last 3 Encounters:  08/14/19 193 lb 2 oz (87.6 kg)  02/25/19 185 lb (83.9 kg)  02/13/19 183 lb 9.6 oz (83.3 kg)     Physical Exam Constitutional:      General: She is not in acute distress.    Appearance: She is well-developed.  HENT:     Head: Normocephalic and atraumatic.  Eyes:     Conjunctiva/sclera: Conjunctivae normal.     Pupils: Pupils are equal, round, and reactive to light.  Neck:     Thyroid: No thyromegaly.  Cardiovascular:     Rate and Rhythm: Normal rate and  regular rhythm.     Heart sounds: Normal heart sounds. No murmur.  Pulmonary:     Effort: Pulmonary effort is normal. No respiratory distress.     Breath sounds: Normal breath sounds. No wheezing or rales.  Abdominal:     General: Bowel sounds are normal. There is no distension.     Palpations: Abdomen is soft.     Tenderness: There is no abdominal tenderness.  Musculoskeletal:        General: Normal range of motion.     Cervical back: Normal range of motion and neck supple.  Lymphadenopathy:     Cervical: No cervical adenopathy.  Skin:    General: Skin is warm and dry.  Neurological:     Mental Status: She is alert and oriented to person, place, and time.  Psychiatric:        Mood and Affect: Mood is anxious and depressed. Affect is blunt and tearful.        Speech: Speech normal.        Thought Content: Thought content normal.        Cognition and Memory: Cognition normal.        Judgment: Judgment normal.       Assessment & Plan:   Gracynn was seen today for medical management of chronic issues.  Diagnoses and all orders for this visit:  GAD (generalized anxiety disorder) -     CBC with Differential/Platelet -     CMP14+EGFR  Depression with anxiety -     clorazepate (TRANXENE) 3.75 MG tablet; Take 1 tablet (3.75 mg total) by mouth daily as needed for anxiety. -     CBC with Differential/Platelet -     CMP14+EGFR  Hypothyroidism, unspecified type -     CBC with Differential/Platelet -     CMP14+EGFR -     TSH + free T4  Essential hypertension -     CBC with Differential/Platelet -     CMP14+EGFR  Hyperlipidemia with target LDL less than 100 -     CBC with Differential/Platelet -     CMP14+EGFR  Flank pain -     CBC with Differential/Platelet -     CMP14+EGFR -     Urinalysis, Complete -     Urine Culture  Other orders -     QUEtiapine (SEROQUEL) 50 MG tablet; Take 1 tablet (50 mg total) by mouth at bedtime.       I have changed [place]  clorazepate. I am also having her start on QUEtiapine. Additionally, I am having her maintain her aspirin EC, cetirizine, Calcium Carb-Cholecalciferol, rosuvastatin, Vitamin D (Ergocalciferol), triamterene-hydrochlorothiazide, amLODipine, estradiol, omeprazole, meloxicam, levothyroxine, DULoxetine, and amoxicillin.  Allergies as of 08/14/2019      Reactions   Sulfonamide Derivatives Rash   SULFONAMIDES INCREASE  SENSITIVITY   TO SUNLIGHT > MORE LIKELY TO SUNBURN SUNBURN-LIKE REDNESS      Medication List       Accurate as of August 14, 2019  9:14 AM. If you have any questions, ask your nurse or doctor.        amLODipine 5 MG tablet Commonly known as: NORVASC TAKE 1 TABLET BY MOUTH EVERY DAY   amoxicillin 500 MG tablet Commonly known as: AMOXIL Take 4 tablets (2,000 mg total) by mouth once as needed for up to 1 dose (one hour before dental procedure).   aspirin EC 81 MG tablet Take 81 mg by mouth daily. WILL STOP PRIOR TO PROCEDURE   Calcium 600 + D 600-200 MG-UNIT Tabs Generic drug: Calcium Carb-Cholecalciferol Take by mouth.   cetirizine 10 MG tablet Commonly known as: ZYRTEC Take 10 mg by mouth daily as needed for allergies.   clorazepate 3.75 MG tablet Commonly known as: TRANXENE Take 1 tablet (3.75 mg total) by mouth daily as needed for anxiety. What changed:   medication strength  how much to take  when to take this  reasons to take this Changed by: Claretta Fraise, MD   DULoxetine 60 MG capsule Commonly known as: CYMBALTA Take 1 capsule (60 mg total) by mouth 2 (two) times daily.   estradiol 0.1 MG/GM vaginal cream Commonly known as: ESTRACE PLACE 2 GRAMS VAGINALLY AS DIRECTED EVERY OTHER NIGHT   levothyroxine 112 MCG tablet Commonly known as: SYNTHROID TAKE 1 TABLET BY MOUTH EVERY DAY   meloxicam 15 MG tablet Commonly known as: MOBIC TAKE 1 TABLET BY MOUTH EVERY DAY   omeprazole 40 MG capsule Commonly known as: PRILOSEC TAKE 1 CAPSULE BY MOUTH EVERY  DAY   QUEtiapine 50 MG tablet Commonly known as: SEROQUEL Take 1 tablet (50 mg total) by mouth at bedtime. Started by: Claretta Fraise, MD   rosuvastatin 20 MG tablet Commonly known as: CRESTOR Take 1 tablet (20 mg total) by mouth daily.   triamterene-hydrochlorothiazide 37.5-25 MG tablet Commonly known as: MAXZIDE-25 TAKE 1 TABLET BY MOUTH DAILY. AS NEEDED FOR SWELLING   Vitamin D (Ergocalciferol) 1.25 MG (50000 UNIT) Caps capsule Commonly known as: DRISDOL Take 1 capsule (50,000 Units total) by mouth every 7 (seven) days.      We discussed in detail strategies to discontinue Tranxene and improve her anxiety and depression control.  She will go to a half dose of Tranxene and started on 50 mg of Seroquel.  After 6 weeks will do a further decrease in transit and possibly discontinue it and increase the Seroquel as needed.  The patient only takes 7-1/2 mg a day.  Sometimes not even that.  Therefore a slower taper may not be necessary.  Follow-up: Return in about 6 weeks (around 09/25/2019) for Anxiety, Hypothyroidism.  Claretta Fraise, M.D.

## 2019-08-15 ENCOUNTER — Encounter: Payer: Self-pay | Admitting: Family Medicine

## 2019-08-15 LAB — TSH+FREE T4
Free T4: 1.77 ng/dL (ref 0.82–1.77)
TSH: 0.382 u[IU]/mL — ABNORMAL LOW (ref 0.450–4.500)

## 2019-08-15 LAB — CBC WITH DIFFERENTIAL/PLATELET
Basophils Absolute: 0.1 10*3/uL (ref 0.0–0.2)
Basos: 1 %
EOS (ABSOLUTE): 0.2 10*3/uL (ref 0.0–0.4)
Eos: 5 %
Hematocrit: 40.5 % (ref 34.0–46.6)
Hemoglobin: 13 g/dL (ref 11.1–15.9)
Immature Grans (Abs): 0 10*3/uL (ref 0.0–0.1)
Immature Granulocytes: 0 %
Lymphocytes Absolute: 1.1 10*3/uL (ref 0.7–3.1)
Lymphs: 24 %
MCH: 27.3 pg (ref 26.6–33.0)
MCHC: 32.1 g/dL (ref 31.5–35.7)
MCV: 85 fL (ref 79–97)
Monocytes Absolute: 0.3 10*3/uL (ref 0.1–0.9)
Monocytes: 7 %
Neutrophils Absolute: 2.9 10*3/uL (ref 1.4–7.0)
Neutrophils: 63 %
Platelets: 207 10*3/uL (ref 150–450)
RBC: 4.77 x10E6/uL (ref 3.77–5.28)
RDW: 14.5 % (ref 11.7–15.4)
WBC: 4.6 10*3/uL (ref 3.4–10.8)

## 2019-08-15 LAB — CMP14+EGFR
ALT: 20 IU/L (ref 0–32)
AST: 25 IU/L (ref 0–40)
Albumin/Globulin Ratio: 1.6 (ref 1.2–2.2)
Albumin: 4.4 g/dL (ref 3.8–4.8)
Alkaline Phosphatase: 158 IU/L — ABNORMAL HIGH (ref 48–121)
BUN/Creatinine Ratio: 20 (ref 12–28)
BUN: 26 mg/dL (ref 8–27)
Bilirubin Total: 0.7 mg/dL (ref 0.0–1.2)
CO2: 24 mmol/L (ref 20–29)
Calcium: 10.3 mg/dL (ref 8.7–10.3)
Chloride: 101 mmol/L (ref 96–106)
Creatinine, Ser: 1.32 mg/dL — ABNORMAL HIGH (ref 0.57–1.00)
GFR calc Af Amer: 48 mL/min/{1.73_m2} — ABNORMAL LOW (ref >59)
GFR calc non Af Amer: 42 mL/min/{1.73_m2} — ABNORMAL LOW (ref >59)
Globulin, Total: 2.7 g/dL (ref 1.5–4.5)
Glucose: 90 mg/dL (ref 65–99)
Potassium: 4 mmol/L (ref 3.5–5.2)
Sodium: 139 mmol/L (ref 134–144)
Total Protein: 7.1 g/dL (ref 6.0–8.5)

## 2019-08-15 NOTE — Progress Notes (Signed)
Hello Lyrica,  Your lab result is normal and/or stable.Some minor variations that are not significant are commonly marked abnormal, but do not represent any medical problem for you.  Best regards, Claretta Fraise, M.D.

## 2019-08-16 LAB — URINE CULTURE

## 2019-08-22 ENCOUNTER — Other Ambulatory Visit: Payer: Self-pay | Admitting: Family Medicine

## 2019-08-22 DIAGNOSIS — E785 Hyperlipidemia, unspecified: Secondary | ICD-10-CM

## 2019-09-03 ENCOUNTER — Other Ambulatory Visit: Payer: Self-pay | Admitting: Family Medicine

## 2019-09-03 DIAGNOSIS — F418 Other specified anxiety disorders: Secondary | ICD-10-CM

## 2019-09-03 DIAGNOSIS — I10 Essential (primary) hypertension: Secondary | ICD-10-CM

## 2019-09-03 DIAGNOSIS — K219 Gastro-esophageal reflux disease without esophagitis: Secondary | ICD-10-CM

## 2019-09-04 NOTE — Telephone Encounter (Signed)
Don't see Vit D on current rx list, ok to refill?

## 2019-09-05 ENCOUNTER — Other Ambulatory Visit: Payer: Self-pay | Admitting: Family Medicine

## 2019-09-12 DIAGNOSIS — H2513 Age-related nuclear cataract, bilateral: Secondary | ICD-10-CM | POA: Diagnosis not present

## 2019-09-12 DIAGNOSIS — H5200 Hypermetropia, unspecified eye: Secondary | ICD-10-CM | POA: Diagnosis not present

## 2019-09-12 DIAGNOSIS — H43393 Other vitreous opacities, bilateral: Secondary | ICD-10-CM | POA: Diagnosis not present

## 2019-09-12 DIAGNOSIS — E7800 Pure hypercholesterolemia, unspecified: Secondary | ICD-10-CM | POA: Diagnosis not present

## 2019-09-12 DIAGNOSIS — I10 Essential (primary) hypertension: Secondary | ICD-10-CM | POA: Diagnosis not present

## 2019-09-12 DIAGNOSIS — Z0100 Encounter for examination of eyes and vision without abnormal findings: Secondary | ICD-10-CM | POA: Diagnosis not present

## 2019-09-26 ENCOUNTER — Encounter: Payer: Self-pay | Admitting: Family Medicine

## 2019-09-26 ENCOUNTER — Ambulatory Visit (INDEPENDENT_AMBULATORY_CARE_PROVIDER_SITE_OTHER): Payer: Medicare HMO | Admitting: Family Medicine

## 2019-09-26 ENCOUNTER — Other Ambulatory Visit: Payer: Self-pay

## 2019-09-26 VITALS — BP 121/79 | HR 68 | Temp 98.0°F | Resp 20 | Ht 64.0 in | Wt 201.0 lb

## 2019-09-26 DIAGNOSIS — R69 Illness, unspecified: Secondary | ICD-10-CM | POA: Diagnosis not present

## 2019-09-26 DIAGNOSIS — F418 Other specified anxiety disorders: Secondary | ICD-10-CM

## 2019-09-26 DIAGNOSIS — E039 Hypothyroidism, unspecified: Secondary | ICD-10-CM

## 2019-09-26 DIAGNOSIS — H6982 Other specified disorders of Eustachian tube, left ear: Secondary | ICD-10-CM | POA: Diagnosis not present

## 2019-09-26 MED ORDER — FEXOFENADINE-PSEUDOEPHED ER 180-240 MG PO TB24
1.0000 | ORAL_TABLET | Freq: Every evening | ORAL | 11 refills | Status: DC
Start: 2019-09-26 — End: 2021-03-30

## 2019-09-26 MED ORDER — CLORAZEPATE DIPOTASSIUM 3.75 MG PO TABS: 3.7500 mg | ORAL_TABLET | Freq: Every day | ORAL | 2 refills | Status: DC

## 2019-09-26 MED ORDER — QUETIAPINE FUMARATE 50 MG PO TABS: 50.0000 mg | ORAL_TABLET | Freq: Every day | ORAL | 1 refills | Status: DC

## 2019-09-26 NOTE — Progress Notes (Signed)
Subjective:  Patient ID: Leslie Robbins, female    DOB: 1955-12-15  Age: 64 y.o. MRN: [id number]  CC: 6 week followup   HPI Leslie Robbins presents for recheck of her thyroid.  She has had a low TSH last time and were monitoring.  She reports today that she is more tired but no other symptoms of hyper thyroidism that is overactive from too much medication.  She has constipation.  She is feeling full.  She is only going to the bathroom for a bowel movement every third day or so.  Patient also presenting for adjustment of her anxiety medicines.  She is tolerating the Seroquel well.  However she is tired some which could be a side effect of the Seroquel.  Interesting issue she is having she is having some word substitution with she thinks she saying 1 thing and the people hearing the conversation reports she saying something different.  There is no temporal line as to whether this started before after she started the Seroquel.  However, she tells me it is getting better.  Depression screen [facility] 2/9 09/26/2019 08/14/2019 02/25/2019  Decreased Interest 1 0 0  Down, Depressed, Hopeless 0 1 0  PHQ - 2 Score 1 1 0  Altered sleeping 0 - -  Tired, decreased energy 3 - -  Change in appetite 1 - -  Feeling bad or failure about yourself  0 - -  Trouble concentrating 1 - -  Moving slowly or fidgety/restless 1 - -  Suicidal thoughts 0 - -  PHQ-9 Score 7 - -  Difficult doing work/chores - - -  Some recent data might be hidden    History Aletheia has a past medical history of Anxiety, Arthritis, Depression, GERD (gastroesophageal reflux disease), History of hiatal hernia, Hypercholesteremia, Hypertension, and Hypothyroidism.   She has a past surgical history that includes Abdominal hysterectomy; Tubal ligation; Right Carpal Tunnel Release; Breast lumpectomy; Colonoscopy (N/A, 05/30/2012); Dilation and curettage of uterus; Cardiac catheterization; Total knee arthroplasty (Left, 09/16/2013); Joint replacement  (Right); Total shoulder arthroplasty (Right, 06/17/2016); and Total shoulder arthroplasty (Right, 06/17/2016).   Her family history includes Bipolar disorder in her daughter; Dementia in her mother; Depression in her sister; Drug abuse in her daughter; Fibromyalgia in her sister; Hypertension in her mother; Lupus in her mother.She reports that she has never smoked. She has never used smokeless tobacco. She reports that she does not drink alcohol and does not use drugs.    ROS Review of Systems  Objective:  BP 121/79   Pulse 68   Temp 98 F (36.7 C) (Temporal)   Resp 20   Ht 5\' 4"  (1.626 m)   Wt 201 lb (91.2 kg)   SpO2 98%   BMI 34.50 kg/m   BP Readings from Last 3 Encounters:  09/26/19 121/79  08/14/19 130/80  02/25/19 104/72    Wt Readings from Last 3 Encounters:  09/26/19 201 lb (91.2 kg)  08/14/19 193 lb 2 oz (87.6 kg)  02/25/19 185 lb (83.9 kg)     Physical Exam    Assessment & Plan:   Leslie Robbins was seen today for 6 week followup.  Diagnoses and all orders for this visit:  Hypothyroidism, unspecified type -     TSH + free T4  Depression with anxiety -     clorazepate (TRANXENE) 3.75 MG tablet; Take 1 tablet (3.75 mg total) by mouth at bedtime.  Dysfunction of left eustachian tube  Other orders -  QUEtiapine (SEROQUEL) 50 MG tablet; Take 1 tablet (50 mg total) by mouth at bedtime. -     fexofenadine-pseudoephedrine (ALLEGRA-D 24) 180-240 MG 24 hr tablet; Take 1 tablet by mouth every evening. For allergy and congestion       I have changed [place] clorazepate. I am also having her start on fexofenadine-pseudoephedrine. Additionally, I am having her maintain her aspirin EC, cetirizine, Calcium Carb-Cholecalciferol, triamterene-hydrochlorothiazide, estradiol, meloxicam, levothyroxine, amoxicillin, rosuvastatin, DULoxetine, omeprazole, Vitamin D (Ergocalciferol), amLODipine, and QUEtiapine.  Allergies as of 09/26/2019      Reactions   Sulfonamide  Derivatives Rash   SULFONAMIDES INCREASE SENSITIVITY   TO SUNLIGHT > MORE LIKELY TO SUNBURN SUNBURN-LIKE REDNESS      Medication List       Accurate as of September 26, 2019 10:12 AM. If you have any questions, ask your nurse or doctor.        amLODipine 5 MG tablet Commonly known as: NORVASC TAKE 1 TABLET BY MOUTH EVERY DAY   amoxicillin 500 MG tablet Commonly known as: AMOXIL Take 4 tablets (2,000 mg total) by mouth once as needed for up to 1 dose (one hour before dental procedure).   aspirin EC 81 MG tablet Take 81 mg by mouth daily. WILL STOP PRIOR TO PROCEDURE   Calcium 600 + D 600-200 MG-UNIT Tabs Generic drug: Calcium Carb-Cholecalciferol Take by mouth.   cetirizine 10 MG tablet Commonly known as: ZYRTEC Take 10 mg by mouth daily as needed for allergies.   clorazepate 3.75 MG tablet Commonly known as: TRANXENE Take 1 tablet (3.75 mg total) by mouth at bedtime. What changed:   when to take this  reasons to take this Changed by: Claretta Fraise, MD   DULoxetine 60 MG capsule Commonly known as: CYMBALTA TAKE 1 CAPSULE (60 MG TOTAL) BY MOUTH 2 (TWO) TIMES DAILY.   estradiol 0.1 MG/GM vaginal cream Commonly known as: ESTRACE PLACE 2 GRAMS VAGINALLY AS DIRECTED EVERY OTHER NIGHT   fexofenadine-pseudoephedrine 180-240 MG 24 hr tablet Commonly known as: ALLEGRA-D 24 Take 1 tablet by mouth every evening. For allergy and congestion Started by: Claretta Fraise, MD   levothyroxine 112 MCG tablet Commonly known as: SYNTHROID TAKE 1 TABLET BY MOUTH EVERY DAY   meloxicam 15 MG tablet Commonly known as: MOBIC TAKE 1 TABLET BY MOUTH EVERY DAY   omeprazole 40 MG capsule Commonly known as: PRILOSEC TAKE 1 CAPSULE BY MOUTH EVERY DAY   QUEtiapine 50 MG tablet Commonly known as: SEROQUEL Take 1 tablet (50 mg total) by mouth at bedtime.   rosuvastatin 20 MG tablet Commonly known as: CRESTOR TAKE 1 TABLET BY MOUTH EVERY DAY   triamterene-hydrochlorothiazide 37.5-25 MG  tablet Commonly known as: MAXZIDE-25 TAKE 1 TABLET BY MOUTH DAILY. AS NEEDED FOR SWELLING   Vitamin D (Ergocalciferol) 1.25 MG (50000 UNIT) Caps capsule Commonly known as: DRISDOL TAKE 1 CAPSULE (50,000 UNITS TOTAL) BY MOUTH EVERY 7 (SEVEN) DAYS.      Since the word substitution is getting better I do not want to cut back on the Seroquel or start a work-up just yet.  She will keep me informed if it does not completely cleared up.  Since she is having hypothyroid symptoms and hyper thyroid numbers on her TSH, will get an updated TSH and free T4 today and use that to help determine her next dose.  Should the constipation continue and we have to cut back on her Synthroid she will need something for support of her constipation and I  would consider Linzess for that.  For the eustachian tube dysfunction I recommended over-the-counter Allegra-D 24.  Follow-up: Return in about 3 months (around 12/27/2019).  Claretta Fraise, M.D.

## 2019-09-27 LAB — TSH+FREE T4
Free T4: 1.06 ng/dL (ref 0.82–1.77)
TSH: 0.839 u[IU]/mL (ref 0.450–4.500)

## 2019-09-30 NOTE — Progress Notes (Signed)
Hello Lyrica,  Your lab result is normal and/or stable.Some minor variations that are not significant are commonly marked abnormal, but do not represent any medical problem for you.  Best regards, Claretta Fraise, M.D.

## 2019-11-03 ENCOUNTER — Other Ambulatory Visit: Payer: Self-pay | Admitting: Family Medicine

## 2019-11-03 DIAGNOSIS — F418 Other specified anxiety disorders: Secondary | ICD-10-CM

## 2019-12-06 ENCOUNTER — Encounter: Payer: Self-pay | Admitting: *Deleted

## 2019-12-08 ENCOUNTER — Other Ambulatory Visit: Payer: Self-pay | Admitting: Family Medicine

## 2019-12-15 ENCOUNTER — Other Ambulatory Visit: Payer: Self-pay | Admitting: Family Medicine

## 2019-12-15 DIAGNOSIS — I10 Essential (primary) hypertension: Secondary | ICD-10-CM

## 2019-12-27 ENCOUNTER — Encounter: Payer: Self-pay | Admitting: *Deleted

## 2020-01-03 ENCOUNTER — Ambulatory Visit: Payer: Medicare HMO | Admitting: Family Medicine

## 2020-01-04 ENCOUNTER — Other Ambulatory Visit: Payer: Self-pay | Admitting: Family Medicine

## 2020-01-04 DIAGNOSIS — E039 Hypothyroidism, unspecified: Secondary | ICD-10-CM

## 2020-01-05 ENCOUNTER — Other Ambulatory Visit: Payer: Self-pay | Admitting: Family Medicine

## 2020-01-05 DIAGNOSIS — F418 Other specified anxiety disorders: Secondary | ICD-10-CM

## 2020-01-14 ENCOUNTER — Ambulatory Visit: Payer: Medicare HMO | Admitting: Family Medicine

## 2020-01-28 ENCOUNTER — Ambulatory Visit (INDEPENDENT_AMBULATORY_CARE_PROVIDER_SITE_OTHER): Payer: Medicare HMO | Admitting: Family Medicine

## 2020-01-28 ENCOUNTER — Encounter: Payer: Self-pay | Admitting: Family Medicine

## 2020-01-28 ENCOUNTER — Other Ambulatory Visit: Payer: Self-pay

## 2020-01-28 VITALS — BP 129/84 | HR 75 | Temp 97.2°F | Resp 20 | Ht 64.0 in | Wt 203.5 lb

## 2020-01-28 DIAGNOSIS — E039 Hypothyroidism, unspecified: Secondary | ICD-10-CM | POA: Diagnosis not present

## 2020-01-28 DIAGNOSIS — K219 Gastro-esophageal reflux disease without esophagitis: Secondary | ICD-10-CM

## 2020-01-28 DIAGNOSIS — Z23 Encounter for immunization: Secondary | ICD-10-CM

## 2020-01-28 DIAGNOSIS — R109 Unspecified abdominal pain: Secondary | ICD-10-CM

## 2020-01-28 DIAGNOSIS — R69 Illness, unspecified: Secondary | ICD-10-CM | POA: Diagnosis not present

## 2020-01-28 DIAGNOSIS — E785 Hyperlipidemia, unspecified: Secondary | ICD-10-CM

## 2020-01-28 DIAGNOSIS — G8929 Other chronic pain: Secondary | ICD-10-CM | POA: Diagnosis not present

## 2020-01-28 DIAGNOSIS — I10 Essential (primary) hypertension: Secondary | ICD-10-CM

## 2020-01-28 DIAGNOSIS — F418 Other specified anxiety disorders: Secondary | ICD-10-CM

## 2020-01-28 LAB — URINALYSIS, COMPLETE
Bilirubin, UA: NEGATIVE
Glucose, UA: NEGATIVE
Ketones, UA: NEGATIVE
Leukocytes,UA: NEGATIVE
Nitrite, UA: NEGATIVE
Protein,UA: NEGATIVE
Specific Gravity, UA: 1.025 (ref 1.005–1.030)
Urobilinogen, Ur: 0.2 mg/dL (ref 0.2–1.0)
pH, UA: 5 (ref 5.0–7.5)

## 2020-01-28 LAB — MICROSCOPIC EXAMINATION

## 2020-01-28 MED ORDER — QUETIAPINE FUMARATE 50 MG PO TABS
ORAL_TABLET | ORAL | 2 refills | Status: DC
Start: 2020-01-28 — End: 2020-02-27

## 2020-01-28 MED ORDER — AMLODIPINE BESYLATE 5 MG PO TABS: 5.0000 mg | ORAL_TABLET | Freq: Every day | ORAL | 0 refills | Status: DC

## 2020-01-28 MED ORDER — VITAMIN D (ERGOCALCIFEROL) 1.25 MG (50000 UNIT) PO CAPS
50000.0000 [IU] | ORAL_CAPSULE | ORAL | 3 refills | Status: DC
Start: 2020-01-28 — End: 2020-07-23

## 2020-01-28 MED ORDER — ROSUVASTATIN CALCIUM 20 MG PO TABS: 20.0000 mg | ORAL_TABLET | Freq: Every day | ORAL | 1 refills | Status: DC

## 2020-01-28 MED ORDER — OMEPRAZOLE 40 MG PO CPDR: 40.0000 mg | DELAYED_RELEASE_CAPSULE | Freq: Every day | ORAL | 1 refills | Status: DC

## 2020-01-28 NOTE — Progress Notes (Signed)
Subjective:  Patient ID: Leslie Robbins, female    DOB: 07-07-1955  Age: 64 y.o. MRN: [id number]  CC: Medical Management of Chronic Issues   HPI REATHEL TURI presents for  follow-up on  thyroid. The patient has a history of hypothyroidism for many years. It has been stable recently. Pt. denies any change in  voice, loss of hair, heat or cold intolerance. Energy level has been adequate to good. Patient denies constipation and diarrhea. No myxedema. Medication is as noted below. Verified that pt is taking it daily on an empty stomach. Well tolerated.   presents for  follow-up of hypertension. Patient has no history of headache chest pain or shortness of breath or recent cough. Patient also denies symptoms of TIA such as focal numbness or weakness. Patient denies side effects from medication. States taking it regularly.  Patient in for follow-up of GERD. Currently asymptomatic taking  PPI daily. There is no chest pain or heartburn. No hematemesis and no melena. No dysphagia or choking. Onset is remote. Progression is stable. Complicating factors, none.    Depression screen [facility] 2/9 01/28/2020 09/26/2019 08/14/2019  Decreased Interest 0 1 0  Down, Depressed, Hopeless 1 0 1  PHQ - 2 Score $Remov'1 1 1  'ZHVKKq$ Altered sleeping - 0 -  Tired, decreased energy - 3 -  Change in appetite - 1 -  Feeling bad or failure about yourself  - 0 -  Trouble concentrating - 1 -  Moving slowly or fidgety/restless - 1 -  Suicidal thoughts - 0 -  PHQ-9 Score - 7 -  Difficult doing work/chores - - -  Some recent data might be hidden    History Drishti has a past medical history of Anxiety, Arthritis, Depression, GERD (gastroesophageal reflux disease), History of hiatal hernia, Hypercholesteremia, Hypertension, and Hypothyroidism.   She has a past surgical history that includes Abdominal hysterectomy; Tubal ligation; Right Carpal Tunnel Release; Breast lumpectomy; Colonoscopy (N/A, 05/30/2012); Dilation and curettage of uterus;  Cardiac catheterization; Total knee arthroplasty (Left, 09/16/2013); Joint replacement (Right); Total shoulder arthroplasty (Right, 06/17/2016); and Total shoulder arthroplasty (Right, 06/17/2016).   Her family history includes Bipolar disorder in her daughter; Dementia in her mother; Depression in her sister; Drug abuse in her daughter; Fibromyalgia in her sister; Hypertension in her mother; Lupus in her mother.She reports that she has never smoked. She has never used smokeless tobacco. She reports that she does not drink alcohol and does not use drugs.    ROS Review of Systems  Objective:  BP 129/84   Pulse 75   Temp (!) 97.2 F (36.2 C) (Temporal)   Resp 20   Ht $R'5\' 4"'pf$  (1.626 m)   Wt 203 lb 8 oz (92.3 kg)   SpO2 96%   BMI 34.93 kg/m   BP Readings from Last 3 Encounters:  01/28/20 129/84  09/26/19 121/79  08/14/19 130/80    Wt Readings from Last 3 Encounters:  01/28/20 203 lb 8 oz (92.3 kg)  09/26/19 201 lb (91.2 kg)  08/14/19 193 lb 2 oz (87.6 kg)     Physical Exam Constitutional:      General: She is not in acute distress.    Appearance: She is well-developed.  HENT:     Head: Normocephalic and atraumatic.  Eyes:     Conjunctiva/sclera: Conjunctivae normal.     Pupils: Pupils are equal, round, and reactive to light.  Neck:     Thyroid: No thyromegaly.  Cardiovascular:     Rate and  Rhythm: Normal rate and regular rhythm.     Heart sounds: Normal heart sounds. No murmur heard.   Pulmonary:     Effort: Pulmonary effort is normal. No respiratory distress.     Breath sounds: Normal breath sounds. No wheezing or rales.  Abdominal:     General: Bowel sounds are normal. There is no distension.     Palpations: Abdomen is soft.     Tenderness: There is no abdominal tenderness.  Musculoskeletal:        General: Normal range of motion.     Cervical back: Normal range of motion and neck supple.  Lymphadenopathy:     Cervical: No cervical adenopathy.  Skin:     General: Skin is warm and dry.  Neurological:     Mental Status: She is alert and oriented to person, place, and time.  Psychiatric:        Behavior: Behavior normal.        Thought Content: Thought content normal.        Judgment: Judgment normal.       Assessment & Plan:   Taji was seen today for medical management of chronic issues.  Diagnoses and all orders for this visit:  Hypothyroidism, unspecified type -     TSH + free T4 -     CBC with Differential/Platelet -     CMP14+EGFR -     Lipid panel  Hyperlipidemia with target LDL less than 100 -     rosuvastatin (CRESTOR) 20 MG tablet; Take 1 tablet (20 mg total) by mouth daily. -     Lipid panel  Gastroesophageal reflux disease without esophagitis -     omeprazole (PRILOSEC) 40 MG capsule; Take 1 capsule (40 mg total) by mouth daily. -     CBC with Differential/Platelet -     CMP14+EGFR  Essential hypertension -     amLODipine (NORVASC) 5 MG tablet; Take 1 tablet (5 mg total) by mouth daily. -     CBC with Differential/Platelet -     CMP14+EGFR  Need for immunization against influenza -     Flu Vaccine QUAD High Dose(Fluad)  Depression with anxiety  Flank pain, chronic -     Urinalysis, Complete -     Urine Culture -     CBC with Differential/Platelet -     CMP14+EGFR  Other orders -     Vitamin D, Ergocalciferol, (DRISDOL) 1.25 MG (50000 UNIT) CAPS capsule; Take 1 capsule (50,000 Units total) by mouth every 7 (seven) days. -     QUEtiapine (SEROQUEL) 50 MG tablet; TAKE 1 TABLET BY MOUTH EVERYDAY AT BEDTIME -     Microscopic Examination       I have changed [place] [facility]'s rosuvastatin, omeprazole, and amLODipine. I am also having her maintain her aspirin EC, cetirizine, Calcium Carb-Cholecalciferol, triamterene-hydrochlorothiazide, estradiol, meloxicam, amoxicillin, clorazepate, fexofenadine-pseudoephedrine, levothyroxine, DULoxetine, Vitamin D (Ergocalciferol), and QUEtiapine.  Allergies as of  01/28/2020      Reactions   Sulfonamide Derivatives Rash   SULFONAMIDES INCREASE SENSITIVITY   TO SUNLIGHT > MORE LIKELY TO SUNBURN SUNBURN-LIKE REDNESS      Medication List       Accurate as of January 28, 2020  9:54 PM. If you have any questions, ask your nurse or doctor.        amLODipine 5 MG tablet Commonly known as: NORVASC Take 1 tablet (5 mg total) by mouth daily.   amoxicillin 500 MG tablet Commonly known as: AMOXIL Take  4 tablets (2,000 mg total) by mouth once as needed for up to 1 dose (one hour before dental procedure).   aspirin EC 81 MG tablet Take 81 mg by mouth daily. WILL STOP PRIOR TO PROCEDURE   Calcium 600 + D 600-200 MG-UNIT Tabs Generic drug: Calcium Carb-Cholecalciferol Take by mouth.   cetirizine 10 MG tablet Commonly known as: ZYRTEC Take 10 mg by mouth daily as needed for allergies.   clorazepate 3.75 MG tablet Commonly known as: TRANXENE Take 1 tablet (3.75 mg total) by mouth at bedtime.   DULoxetine 60 MG capsule Commonly known as: CYMBALTA TAKE 1 CAPSULE (60 MG TOTAL) BY MOUTH 2 (TWO) TIMES DAILY.   estradiol 0.1 MG/GM vaginal cream Commonly known as: ESTRACE PLACE 2 GRAMS VAGINALLY AS DIRECTED EVERY OTHER NIGHT   fexofenadine-pseudoephedrine 180-240 MG 24 hr tablet Commonly known as: ALLEGRA-D 24 Take 1 tablet by mouth every evening. For allergy and congestion   levothyroxine 112 MCG tablet Commonly known as: SYNTHROID TAKE 1 TABLET BY MOUTH EVERY DAY   meloxicam 15 MG tablet Commonly known as: MOBIC TAKE 1 TABLET BY MOUTH EVERY DAY   omeprazole 40 MG capsule Commonly known as: PRILOSEC Take 1 capsule (40 mg total) by mouth daily. What changed: how much to take Changed by: Claretta Fraise, MD   QUEtiapine 50 MG tablet Commonly known as: SEROQUEL TAKE 1 TABLET BY MOUTH EVERYDAY AT BEDTIME   rosuvastatin 20 MG tablet Commonly known as: CRESTOR Take 1 tablet (20 mg total) by mouth daily.     triamterene-hydrochlorothiazide 37.5-25 MG tablet Commonly known as: MAXZIDE-25 TAKE 1 TABLET BY MOUTH DAILY. AS NEEDED FOR SWELLING   Vitamin D (Ergocalciferol) 1.25 MG (50000 UNIT) Caps capsule Commonly known as: DRISDOL Take 1 capsule (50,000 Units total) by mouth every 7 (seven) days.        Follow-up: Return in about 3 months (around 04/29/2020).  Claretta Fraise, M.D.

## 2020-01-28 NOTE — Patient Instructions (Signed)

## 2020-01-29 LAB — CMP14+EGFR
ALT: 24 IU/L (ref 0–32)
AST: 26 IU/L (ref 0–40)
Albumin/Globulin Ratio: 1.8 (ref 1.2–2.2)
Albumin: 4.6 g/dL (ref 3.8–4.8)
Alkaline Phosphatase: 167 IU/L — ABNORMAL HIGH (ref 44–121)
BUN/Creatinine Ratio: 21 (ref 12–28)
BUN: 25 mg/dL (ref 8–27)
Bilirubin Total: 0.5 mg/dL (ref 0.0–1.2)
CO2: 24 mmol/L (ref 20–29)
Calcium: 10.5 mg/dL — ABNORMAL HIGH (ref 8.7–10.3)
Chloride: 102 mmol/L (ref 96–106)
Creatinine, Ser: 1.18 mg/dL — ABNORMAL HIGH (ref 0.57–1.00)
GFR calc Af Amer: 56 mL/min/{1.73_m2} — ABNORMAL LOW (ref >59)
GFR calc non Af Amer: 48 mL/min/{1.73_m2} — ABNORMAL LOW (ref >59)
Globulin, Total: 2.6 g/dL (ref 1.5–4.5)
Glucose: 88 mg/dL (ref 65–99)
Potassium: 4.4 mmol/L (ref 3.5–5.2)
Sodium: 141 mmol/L (ref 134–144)
Total Protein: 7.2 g/dL (ref 6.0–8.5)

## 2020-01-29 LAB — CBC WITH DIFFERENTIAL/PLATELET
Basophils Absolute: 0.1 10*3/uL (ref 0.0–0.2)
Basos: 1 %
EOS (ABSOLUTE): 0.4 10*3/uL (ref 0.0–0.4)
Eos: 7 %
Hematocrit: 43.7 % (ref 34.0–46.6)
Hemoglobin: 13.3 g/dL (ref 11.1–15.9)
Immature Grans (Abs): 0 10*3/uL (ref 0.0–0.1)
Immature Granulocytes: 0 %
Lymphocytes Absolute: 1.4 10*3/uL (ref 0.7–3.1)
Lymphs: 25 %
MCH: 25.7 pg — ABNORMAL LOW (ref 26.6–33.0)
MCHC: 30.4 g/dL — ABNORMAL LOW (ref 31.5–35.7)
MCV: 85 fL (ref 79–97)
Monocytes Absolute: 0.4 10*3/uL (ref 0.1–0.9)
Monocytes: 7 %
Neutrophils Absolute: 3.4 10*3/uL (ref 1.4–7.0)
Neutrophils: 60 %
Platelets: 261 10*3/uL (ref 150–450)
RBC: 5.17 x10E6/uL (ref 3.77–5.28)
RDW: 13.8 % (ref 11.7–15.4)
WBC: 5.6 10*3/uL (ref 3.4–10.8)

## 2020-01-29 LAB — LIPID PANEL
Chol/HDL Ratio: 2.5 ratio (ref 0.0–4.4)
Cholesterol, Total: 130 mg/dL (ref 100–199)
HDL: 52 mg/dL (ref >39)
LDL Chol Calc (NIH): 59 mg/dL (ref 0–99)
Triglycerides: 106 mg/dL (ref 0–149)
VLDL Cholesterol Cal: 19 mg/dL (ref 5–40)

## 2020-01-29 LAB — TSH+FREE T4
Free T4: 1.17 ng/dL (ref 0.82–1.77)
TSH: 0.365 u[IU]/mL — ABNORMAL LOW (ref 0.450–4.500)

## 2020-01-31 LAB — URINE CULTURE

## 2020-02-02 NOTE — Progress Notes (Signed)
Hello Lyrica,  Your lab result is normal and/or stable.Some minor variations that are not significant are commonly marked abnormal, but do not represent any medical problem for you.  Best regards, Claretta Fraise, M.D.

## 2020-02-05 ENCOUNTER — Other Ambulatory Visit: Payer: Self-pay | Admitting: Family Medicine

## 2020-02-05 DIAGNOSIS — I10 Essential (primary) hypertension: Secondary | ICD-10-CM

## 2020-02-26 ENCOUNTER — Other Ambulatory Visit: Payer: Self-pay | Admitting: Family Medicine

## 2020-03-04 ENCOUNTER — Telehealth: Payer: Self-pay

## 2020-03-04 ENCOUNTER — Ambulatory Visit: Payer: Medicare HMO

## 2020-03-23 ENCOUNTER — Ambulatory Visit: Payer: Medicare HMO

## 2020-03-23 ENCOUNTER — Other Ambulatory Visit: Payer: Self-pay

## 2020-04-12 ENCOUNTER — Other Ambulatory Visit: Payer: Self-pay | Admitting: Family Medicine

## 2020-04-12 DIAGNOSIS — F418 Other specified anxiety disorders: Secondary | ICD-10-CM

## 2020-05-08 ENCOUNTER — Other Ambulatory Visit: Payer: Self-pay | Admitting: Family Medicine

## 2020-05-22 ENCOUNTER — Other Ambulatory Visit: Payer: Self-pay | Admitting: Obstetrics & Gynecology

## 2020-05-22 ENCOUNTER — Ambulatory Visit: Payer: Medicare HMO

## 2020-06-15 ENCOUNTER — Ambulatory Visit (INDEPENDENT_AMBULATORY_CARE_PROVIDER_SITE_OTHER): Payer: Medicare HMO

## 2020-06-15 VITALS — Ht 64.0 in | Wt 203.0 lb

## 2020-06-15 DIAGNOSIS — Z1231 Encounter for screening mammogram for malignant neoplasm of breast: Secondary | ICD-10-CM | POA: Diagnosis not present

## 2020-06-15 DIAGNOSIS — Z0000 Encounter for general adult medical examination without abnormal findings: Secondary | ICD-10-CM | POA: Diagnosis not present

## 2020-06-15 DIAGNOSIS — Z780 Asymptomatic menopausal state: Secondary | ICD-10-CM | POA: Diagnosis not present

## 2020-06-15 NOTE — Patient Instructions (Signed)
Leslie Robbins , Thank you for taking time to come for your Medicare Wellness Visit. I appreciate your ongoing commitment to your health goals. Please review the following plan we discussed and let me know if I can assist you in the future.   Screening recommendations/referrals: Colonoscopy: Done 05/30/2012 - Repeat in 10 years Mammogram: Done 04/04/2019 - Ordered repeat today Bone Density: Done 02/12/2018 - Ordered repeat today Recommended yearly ophthalmology/optometry visit for glaucoma screening and checkup Recommended yearly dental visit for hygiene and checkup  Vaccinations: Influenza vaccine: Done 01/28/2020 - repeat annually Pneumococcal vaccine: Prevnar-13 done 12//2020, due for Pneumovax-23 Tdap vaccine: Due Shingles vaccine: Shingrix discussed. Please contact your pharmacy for coverage information.    Covid-19: Done 06/09/2019 & 07/17/2019; due for booster  Advanced directives: Advance directive discussed with you today. I have provided a copy for you to complete at home and have notarized. Once this is complete please bring a copy in to our office so we can scan it into your chart.  Conditions/risks identified: Try to go for a 30 minute brisk walk every day - this is great for overall health and can help your stress levels. Aim for 6-8 glasses of water daily.  Next appointment: Follow up in one year for your annual wellness visit    Preventive Care 65 Years and Older, Female Preventive care refers to lifestyle choices and visits with your health care provider that can promote health and wellness. What does preventive care include?  A yearly physical exam. This is also called an annual well check.  Dental exams once or twice a year.  Routine eye exams. Ask your health care provider how often you should have your eyes checked.  Personal lifestyle choices, including:  Daily care of your teeth and gums.  Regular physical activity.  Eating a healthy diet.  Avoiding tobacco and  drug use.  Limiting alcohol use.  Practicing safe sex.  Taking low-dose aspirin every day.  Taking vitamin and mineral supplements as recommended by your health care provider. What happens during an annual well check? The services and screenings done by your health care provider during your annual well check will depend on your age, overall health, lifestyle risk factors, and family history of disease. Counseling  Your health care provider may ask you questions about your:  Alcohol use.  Tobacco use.  Drug use.  Emotional well-being.  Home and relationship well-being.  Sexual activity.  Eating habits.  History of falls.  Memory and ability to understand (cognition).  Work and work Statistician.  Reproductive health. Screening  You may have the following tests or measurements:  Height, weight, and BMI.  Blood pressure.  Lipid and cholesterol levels. These may be checked every 5 years, or more frequently if you are over 34 years old.  Skin check.  Lung cancer screening. You may have this screening every year starting at age 75 if you have a 30-pack-year history of smoking and currently smoke or have quit within the past 15 years.  Fecal occult blood test (FOBT) of the stool. You may have this test every year starting at age 28.  Flexible sigmoidoscopy or colonoscopy. You may have a sigmoidoscopy every 5 years or a colonoscopy every 10 years starting at age 89.  Hepatitis C blood test.  Hepatitis B blood test.  Sexually transmitted disease (STD) testing.  Diabetes screening. This is done by checking your blood sugar (glucose) after you have not eaten for a while (fasting). You may have this done  every 1-3 years.  Bone density scan. This is done to screen for osteoporosis. You may have this done starting at age 39.  Mammogram. This may be done every 1-2 years. Talk to your health care provider about how often you should have regular mammograms. Talk with your  health care provider about your test results, treatment options, and if necessary, the need for more tests. Vaccines  Your health care provider may recommend certain vaccines, such as:  Influenza vaccine. This is recommended every year.  Tetanus, diphtheria, and acellular pertussis (Tdap, Td) vaccine. You may need a Td booster every 10 years.  Zoster vaccine. You may need this after age 33.  Pneumococcal 13-valent conjugate (PCV13) vaccine. One dose is recommended after age 53.  Pneumococcal polysaccharide (PPSV23) vaccine. One dose is recommended after age 34. Talk to your health care provider about which screenings and vaccines you need and how often you need them. This information is not intended to replace advice given to you by your health care provider. Make sure you discuss any questions you have with your health care provider. Document Released: 03/20/2015 Document Revised: 11/11/2015 Document Reviewed: 12/23/2014 Elsevier Interactive Patient Education  2017 [employer] Prevention in the Home Falls can cause injuries. They can happen to people of all ages. There are many things you can do to make your home safe and to help prevent falls. What can I do on the outside of my home?  Regularly fix the edges of walkways and driveways and fix any cracks.  Remove anything that might make you trip as you walk through a door, such as a raised step or threshold.  Trim any bushes or trees on the path to your home.  Use bright outdoor lighting.  Clear any walking paths of anything that might make someone trip, such as rocks or tools.  Regularly check to see if handrails are loose or broken. Make sure that both sides of any steps have handrails.  Any raised decks and porches should have guardrails on the edges.  Have any leaves, snow, or ice cleared regularly.  Use sand or salt on walking paths during winter.  Clean up any spills in your garage right away. This includes oil  or grease spills. What can I do in the bathroom?  Use night lights.  Install grab bars by the toilet and in the tub and shower. Do not use towel bars as grab bars.  Use non-skid mats or decals in the tub or shower.  If you need to sit down in the shower, use a plastic, non-slip stool.  Keep the floor dry. Clean up any water that spills on the floor as soon as it happens.  Remove soap buildup in the tub or shower regularly.  Attach bath mats securely with double-sided non-slip rug tape.  Do not have throw rugs and other things on the floor that can make you trip. What can I do in the bedroom?  Use night lights.  Make sure that you have a light by your bed that is easy to reach.  Do not use any sheets or blankets that are too big for your bed. They should not hang down onto the floor.  Have a firm chair that has side arms. You can use this for support while you get dressed.  Do not have throw rugs and other things on the floor that can make you trip. What can I do in the kitchen?  Clean up any spills right  away.  Avoid walking on wet floors.  Keep items that you use a lot in easy-to-reach places.  If you need to reach something above you, use a strong step stool that has a grab bar.  Keep electrical cords out of the way.  Do not use floor polish or wax that makes floors slippery. If you must use wax, use non-skid floor wax.  Do not have throw rugs and other things on the floor that can make you trip. What can I do with my stairs?  Do not leave any items on the stairs.  Make sure that there are handrails on both sides of the stairs and use them. Fix handrails that are broken or loose. Make sure that handrails are as long as the stairways.  Check any carpeting to make sure that it is firmly attached to the stairs. Fix any carpet that is loose or worn.  Avoid having throw rugs at the top or bottom of the stairs. If you do have throw rugs, attach them to the floor with  carpet tape.  Make sure that you have a light switch at the top of the stairs and the bottom of the stairs. If you do not have them, ask someone to add them for you. What else can I do to help prevent falls?  Wear shoes that:  Do not have high heels.  Have rubber bottoms.  Are comfortable and fit you well.  Are closed at the toe. Do not wear sandals.  If you use a stepladder:  Make sure that it is fully opened. Do not climb a closed stepladder.  Make sure that both sides of the stepladder are locked into place.  Ask someone to hold it for you, if possible.  Clearly mark and make sure that you can see:  Any grab bars or handrails.  First and last steps.  Where the edge of each step is.  Use tools that help you move around (mobility aids) if they are needed. These include:  Canes.  Walkers.  Scooters.  Crutches.  Turn on the lights when you go into a dark area. Replace any light bulbs as soon as they burn out.  Set up your furniture so you have a clear path. Avoid moving your furniture around.  If any of your floors are uneven, fix them.  If there are any pets around you, be aware of where they are.  Review your medicines with your doctor. Some medicines can make you feel dizzy. This can increase your chance of falling. Ask your doctor what other things that you can do to help prevent falls. This information is not intended to replace advice given to you by your health care provider. Make sure you discuss any questions you have with your health care provider. Document Released: 12/18/2008 Document Revised: 07/30/2015 Document Reviewed: 03/28/2014 Elsevier Interactive Patient Education  2017 [employer].

## 2020-06-15 NOTE — Progress Notes (Signed)
Subjective:   Leslie Robbins is a 65 y.o. female who presents for an Initial Medicare Annual Wellness Visit.  Virtual Visit via Telephone Note  I connected with  Leslie Robbins on 06/15/20 at  4:15 PM EDT by telephone and verified that I am speaking with the correct person using two identifiers.  Location: Patient: Home Provider: [facility] Persons participating in the virtual visit: patient/Nurse Health Advisor   I discussed the limitations, risks, security and privacy concerns of performing an evaluation and management service by telephone and the availability of in person appointments. The patient expressed understanding and agreed to proceed.  Interactive audio and video telecommunications were attempted between this nurse and patient, however failed, due to patient having technical difficulties OR patient did not have access to video capability.  We continued and completed visit with audio only.  Some vital signs may be absent or patient reported.   Clayvon Parlett E Ramsey Guadamuz, LPN   Review of Systems     Cardiac Risk Factors include: advanced age (>2men, >35 women);dyslipidemia;hypertension;obesity (BMI >30kg/m2)     Objective:    Today's Vitals   06/15/20 1601 06/15/20 1602  Weight: 203 lb (92.1 kg)   Height: 5\' 4"  (1.626 m)   PainSc:  4    Body mass index is 34.84 kg/m.  Advanced Directives 06/15/2020 07/14/2016 07/05/2016 06/17/2016 06/13/2016 04/21/2016 09/16/2013  Does Patient Have a Medical Advance Directive? No No No No No No Patient does not have advance directive;Patient would like information  Would patient like information on creating a medical advance directive? Yes (MAU/Ambulatory/Procedural Areas - Information given) No - Patient declined No - Patient declined No - Patient declined No - Patient declined - Advance directive packet given  Pre-existing out of facility DNR order (yellow form or pink MOST form) - - - - - - No    Current Medications (verified) Outpatient Encounter  Medications as of 06/15/2020  Medication Sig  . amLODipine (NORVASC) 5 MG tablet Take 1 tablet (5 mg total) by mouth daily.  . cetirizine (ZYRTEC) 10 MG tablet Take 10 mg by mouth daily as needed for allergies.  Marland Kitchen CLINPRO 5000 1.1 % PSTE Place onto teeth at bedtime.  . clorazepate (TRANXENE) 3.75 MG tablet Take 1 tablet (3.75 mg total) by mouth at bedtime.  . DULoxetine (CYMBALTA) 60 MG capsule TAKE 1 CAPSULE BY MOUTH 2 TIMES DAILY.  Marland Kitchen estradiol (ESTRACE) 0.1 MG/GM vaginal cream PLACE 2 GRAMS VAGINALLY AS DIRECTED EVERY OTHER NIGHT  . levothyroxine (SYNTHROID) 112 MCG tablet TAKE 1 TABLET BY MOUTH EVERY DAY  . meloxicam (MOBIC) 15 MG tablet TAKE 1 TABLET BY MOUTH EVERY DAY  . omeprazole (PRILOSEC) 40 MG capsule Take 1 capsule (40 mg total) by mouth daily.  . QUEtiapine (SEROQUEL) 50 MG tablet TAKE 1 TABLET BY MOUTH EVERYDAY AT BEDTIME  . rosuvastatin (CRESTOR) 20 MG tablet Take 1 tablet (20 mg total) by mouth daily.  Marland Kitchen triamterene-hydrochlorothiazide (MAXZIDE-25) 37.5-25 MG tablet TAKE 1 TABLET BY MOUTH DAILY. AS NEEDED FOR SWELLING  . Vitamin D, Ergocalciferol, (DRISDOL) 1.25 MG (50000 UNIT) CAPS capsule Take 1 capsule (50,000 Units total) by mouth every 7 (seven) days.  Marland Kitchen amoxicillin (AMOXIL) 500 MG tablet Take 4 tablets (2,000 mg total) by mouth once as needed for up to 1 dose (one hour before dental procedure). (Patient not taking: Reported on 06/15/2020)  . aspirin EC 81 MG tablet Take 81 mg by mouth daily. WILL STOP PRIOR TO PROCEDURE (Patient not taking: Reported on 06/15/2020)  .  Calcium Carb-Cholecalciferol 600-200 MG-UNIT TABS Take by mouth. (Patient not taking: Reported on 06/15/2020)  . fexofenadine-pseudoephedrine (ALLEGRA-D 24) 180-240 MG 24 hr tablet Take 1 tablet by mouth every evening. For allergy and congestion (Patient not taking: Reported on 06/15/2020)   No facility-administered encounter medications on file as of 06/15/2020.    Allergies (verified) Sulfonamide derivatives    History: Past Medical History:  Diagnosis Date  . Anxiety   . Arthritis   . Depression   . GERD (gastroesophageal reflux disease)   . History of hiatal hernia   . Hypercholesteremia   . Hypertension   . Hypothyroidism    Past Surgical History:  Procedure Laterality Date  . ABDOMINAL HYSTERECTOMY    . BREAST LUMPECTOMY     benign  . CARDIAC CATHETERIZATION     9 YRS AGO  . COLONOSCOPY N/A 05/30/2012   Procedure: COLONOSCOPY;  Surgeon: Rogene Houston, MD;  Location: [facility];  Service: Endoscopy;  Laterality: N/A;  830-moved to 1125 Ann to notify pt  . DILATION AND CURETTAGE OF UTERUS     X2  . JOINT REPLACEMENT Right    right hip  . Right Carpal Tunnel Release    . 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   Procedure: LEFT TOTAL KNEE ARTHROPLASTY;  Surgeon: Gearlean Alf, MD;  Location: [facility];  Service: Orthopedics;  Laterality: Left;  . TOTAL SHOULDER ARTHROPLASTY Right 06/17/2016  . TOTAL SHOULDER ARTHROPLASTY Right 06/17/2016   Procedure: RIGHT TOTAL SHOULDER ARTHROPLASTY;  Surgeon: Netta Cedars, MD;  Location: [facility];  Service: Orthopedics;  Laterality: Right;  . TUBAL LIGATION     Family History  Problem Relation Age of Onset  . Dementia Mother   . Hypertension Mother   . Lupus Mother   . Fibromyalgia Sister   . Depression Sister   . Bipolar disorder Daughter   . Drug abuse Daughter   . Colon cancer Neg Hx    Social History   Socioeconomic History  . Marital status: Married    Spouse name: Legrand Como  . Number of children: Not on file  . Years of education: Not on file  . Highest education level: Not on file  Occupational History  . 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 Smoking status: Never Smoker  . Smokeless tobacco: Never Used  Vaping Use  . Vaping Use: Never used  Substance and Sexual Activity  . Alcohol use: No  . Drug use: No  . Sexual activity: Yes    Birth control/protection: Surgical    Comment: hyst  Other  Topics Concern  . Not on file  Social History Narrative   2 grandchildren live with her and her husband full time - she has been their full time caregiver since 2019   Social Determinants of Health   Financial Resource Strain: Low Risk   . Difficulty of Paying Living Expenses: Not hard at all  Food Insecurity: No Food Insecurity  . Worried About Charity fundraiser in the Last Year: Never true  . Ran Out of Food in the Last Year: Never true  Transportation Needs: No Transportation Needs  . Lack of Transportation (Medical): No  . Lack of Transportation (Non-Medical): No  Physical Activity: Inactive  . Days of Exercise per Week: 0 days  . Minutes of Exercise per Session: 0 min  Stress: Stress Concern Present  . Feeling of Stress : To some extent  Social Connections: Socially Integrated  . Frequency of  Communication with Friends and Family: More than three times a week  . Frequency of Social Gatherings with Friends and Family: More than three times a week  . Attends Religious Services: More than 4 times per year  . Active Member of Clubs or Organizations: Yes  . Attends Archivist Meetings: More than 4 times per year  . Marital Status: Married    Tobacco Counseling Counseling given: Not Answered   Clinical Intake:  Pre-visit preparation completed: Yes  Pain : 0-10 Pain Score: 4  Pain Type: Chronic pain Pain Location: Back Pain Orientation: Lower Pain Onset: More than a month ago Pain Frequency: Intermittent     BMI - recorded: 34.84 Nutritional Status: BMI > 30  Obese Nutritional Risks: None Diabetes: No  How often do you need to have someone help you when you read instructions, pamphlets, or other written materials from your doctor or pharmacy?: 1 - Never  Diabetic? No  Interpreter Needed?: No  Information entered by :: Averyana Pillars, LPN   Activities of Daily Living In your present state of health, do you have any difficulty performing the  following activities: 06/15/2020  Hearing? N  Vision? N  Difficulty concentrating or making decisions? N  Walking or climbing stairs? N  Dressing or bathing? N  Doing errands, shopping? N  Preparing Food and eating ? N  Using the Toilet? N  In the past six months, have you accidently leaked urine? Y  Comment pads for protection at times  Do you have problems with loss of bowel control? N  Managing your Medications? N  Managing your Finances? N  Housekeeping or managing your Housekeeping? N  Some recent data might be hidden    Patient Care Team: Claretta Fraise, MD as PCP - General (Family Medicine)  Indicate any recent Medical Services you may have received from other than [facility] providers in the past year (date may be approximate).     Assessment:   This is a routine wellness examination for Leslie Robbins.  Hearing/Vision screen  Hearing Screening   125Hz  250Hz  500Hz  1000Hz  2000Hz  3000Hz  4000Hz  6000Hz  8000Hz   Right ear:           Left ear:           Comments: No complaints of hearing loss  Vision Screening Comments: Wears glasses - Routine eye exams with [facility] in [place].  Dietary issues and exercise activities discussed: Current Exercise Habits: The patient does not participate in regular exercise at present, Exercise limited by: orthopedic condition(s)  Goals    . Exercise 3x per week (30 min per time)      Depression Screen PHQ 2/9 Scores 06/15/2020 01/28/2020 09/26/2019 08/14/2019 02/25/2019 02/13/2019 08/14/2018  PHQ - 2 Score 0 1 1 1  0 3 0  PHQ- 9 Score - - 7 - - 7 -    Fall Risk Fall Risk  06/15/2020 01/28/2020 09/26/2019 08/14/2019 02/25/2019  Falls in the past year? 1 1 1 1 1   Comment she tripped over toys in the floor - no injuries - - - -  Number falls in past yr: 0 1 0 0 0  Injury with Fall? 0 0 0 0 0  Risk for fall due to : History of fall(s) History of fall(s) History of fall(s) History of fall(s) -  Follow up Falls prevention discussed Falls evaluation completed  Falls evaluation completed Falls evaluation completed -    FALL RISK PREVENTION PERTAINING TO THE HOME:  Any stairs in or around the  home? Yes  If so, are there any without handrails? No  Home free of loose throw rugs in walkways, pet beds, electrical cords, etc? Yes  sometimes there are toys in the floor - she knows the risks Adequate lighting in your home to reduce risk of falls? Yes   ASSISTIVE DEVICES UTILIZED TO PREVENT FALLS:  Life alert? No  Use of a cane, walker or w/c? No  Grab bars in the bathroom? Yes  Shower chair or bench in shower? No  Elevated toilet seat or a handicapped toilet? No   TIMED UP AND GO:  Was the test performed? No . Telephonic visit.  Cognitive Function: Normal cognitive status assessed by direct observation by this Nurse Health Advisor. No abnormalities found.   MMSE - Mini Mental State Exam 02/25/2019  Orientation to time 5  Orientation to Place 5  Registration 3  Attention/ Calculation 5  Recall 2  Language- name 2 objects 2  Language- repeat 1  Language- follow 3 step command 3  Language- read & follow direction 1  Write a sentence 1  Copy design 1  Total score 29        Immunizations Immunization History  Administered Date(s) Administered  . Fluad Quad(high Dose 65+) 12/03/2018, 01/28/2020  . Hepatitis B 04/30/2010, 05/31/2010, 10/29/2010  . Influenza,inj,Quad PF,6+ Mos 12/31/2012, 12/30/2013, 01/08/2015, 11/26/2015, 12/28/2016  . Influenza,inj,quad, With Preservative 12/05/2016  . Influenza-Unspecified 12/28/2016  . Moderna Sars-Covid-2 Vaccination 06/19/2019, 07/17/2019  . Pneumococcal Conjugate-13 02/13/2019    TDAP status: Due, Education has been provided regarding the importance of this vaccine. Advised may receive this vaccine at local pharmacy or Health Dept. Aware to provide a copy of the vaccination record if obtained from local pharmacy or Health Dept. Verbalized acceptance and understanding.  Flu Vaccine status: Up  to date  Pneumococcal vaccine status: Due, Education has been provided regarding the importance of this vaccine. Advised may receive this vaccine at local pharmacy or Health Dept. Aware to provide a copy of the vaccination record if obtained from local pharmacy or Health Dept. Verbalized acceptance and understanding.  Covid-19 vaccine status: Information provided on how to obtain vaccines.   Qualifies for Shingles Vaccine? Yes   Zostavax completed No   Shingrix Completed?: No.    Education has been provided regarding the importance of this vaccine. Patient has been advised to call insurance company to determine out of pocket expense if they have not yet received this vaccine. Advised may also receive vaccine at local pharmacy or Health Dept. Verbalized acceptance and understanding.  Screening Tests Health Maintenance  Topic Date Due  . DEXA SCAN  02/13/2020  . PNA vac Low Risk Adult (2 of 2 - PPSV23) 02/13/2020  . COVID-19 Vaccine (3 - Booster for Moderna series) 07/01/2020 (Originally 01/17/2020)  . INFLUENZA VACCINE  10/05/2020  . MAMMOGRAM  04/02/2021  . TETANUS/TDAP  12/31/2021  . COLONOSCOPY (Pts 45-23yrs Insurance coverage will need to be confirmed)  05/31/2022  . Hepatitis C Screening  Completed  . HPV VACCINES  Aged Out    Health Maintenance  Health Maintenance Due  Topic Date Due  . DEXA SCAN  02/13/2020  . PNA vac Low Risk Adult (2 of 2 - PPSV23) 02/13/2020    Colorectal cancer screening: Type of screening: Colonoscopy. Completed 05/30/2012. Repeat every 10 years  Mammogram status: Ordered 06/15/2020. Pt provided with contact info and advised to call to schedule appt.   Bone Density status: Ordered 06/15/2020. Pt provided with contact info and advised  to call to schedule appt.  Lung Cancer Screening: (Low Dose CT Chest recommended if Age 29-80 years, 30 pack-year currently smoking OR have quit w/in 15years.) does not qualify.   Additional Screening:  Hepatitis C  Screening: does qualify; Completed 02/13/2019  Vision Screening: Recommended annual ophthalmology exams for early detection of glaucoma and other disorders of the eye. Is the patient up to date with their annual eye exam?  Yes  Who is the provider or what is the name of the office in which the patient attends annual eye exams? [facility] in [place] If pt is not established with a provider, would they like to be referred to a provider to establish care? No .   Dental Screening: Recommended annual dental exams for proper oral hygiene  Community Resource Referral / Chronic Care Management: CRR required this visit?  No   CCM required this visit?  No      Plan:     I have personally reviewed and noted the following in the patient's chart:   . Medical and social history . Use of alcohol, tobacco or illicit drugs  . Current medications and supplements . Functional ability and status . Nutritional status . Physical activity . Advanced directives . List of other physicians . Hospitalizations, surgeries, and ER visits in previous 12 months . Vitals . Screenings to include cognitive, depression, and falls . Referrals and appointments  In addition, I have reviewed and discussed with patient certain preventive protocols, quality metrics, and best practice recommendations. A written personalized care plan for preventive services as well as general preventive health recommendations were provided to patient.     Sandrea Hammond, LPN   10/07/2120   Nurse Notes: None

## 2020-06-19 DIAGNOSIS — Z96653 Presence of artificial knee joint, bilateral: Secondary | ICD-10-CM | POA: Diagnosis not present

## 2020-07-11 ENCOUNTER — Other Ambulatory Visit: Payer: Self-pay | Admitting: Family Medicine

## 2020-07-11 DIAGNOSIS — E039 Hypothyroidism, unspecified: Secondary | ICD-10-CM

## 2020-07-18 ENCOUNTER — Other Ambulatory Visit: Payer: Self-pay | Admitting: Family Medicine

## 2020-07-23 ENCOUNTER — Ambulatory Visit (INDEPENDENT_AMBULATORY_CARE_PROVIDER_SITE_OTHER): Payer: Medicare HMO | Admitting: Family Medicine

## 2020-07-23 ENCOUNTER — Encounter: Payer: Self-pay | Admitting: Family Medicine

## 2020-07-23 ENCOUNTER — Other Ambulatory Visit: Payer: Self-pay

## 2020-07-23 VITALS — BP 117/81 | HR 69 | Temp 98.0°F | Ht 64.0 in | Wt 201.8 lb

## 2020-07-23 DIAGNOSIS — M5442 Lumbago with sciatica, left side: Secondary | ICD-10-CM | POA: Diagnosis not present

## 2020-07-23 DIAGNOSIS — M1712 Unilateral primary osteoarthritis, left knee: Secondary | ICD-10-CM

## 2020-07-23 DIAGNOSIS — M5441 Lumbago with sciatica, right side: Secondary | ICD-10-CM

## 2020-07-23 DIAGNOSIS — Z23 Encounter for immunization: Secondary | ICD-10-CM | POA: Diagnosis not present

## 2020-07-23 DIAGNOSIS — K219 Gastro-esophageal reflux disease without esophagitis: Secondary | ICD-10-CM | POA: Diagnosis not present

## 2020-07-23 DIAGNOSIS — G8929 Other chronic pain: Secondary | ICD-10-CM

## 2020-07-23 DIAGNOSIS — E559 Vitamin D deficiency, unspecified: Secondary | ICD-10-CM | POA: Diagnosis not present

## 2020-07-23 DIAGNOSIS — E785 Hyperlipidemia, unspecified: Secondary | ICD-10-CM

## 2020-07-23 DIAGNOSIS — I10 Essential (primary) hypertension: Secondary | ICD-10-CM | POA: Diagnosis not present

## 2020-07-23 DIAGNOSIS — E039 Hypothyroidism, unspecified: Secondary | ICD-10-CM | POA: Diagnosis not present

## 2020-07-23 DIAGNOSIS — F418 Other specified anxiety disorders: Secondary | ICD-10-CM | POA: Diagnosis not present

## 2020-07-23 DIAGNOSIS — R69 Illness, unspecified: Secondary | ICD-10-CM | POA: Diagnosis not present

## 2020-07-23 MED ORDER — LEVOTHYROXINE SODIUM 112 MCG PO TABS: 112.0000 ug | ORAL_TABLET | Freq: Every day | ORAL | 0 refills | Status: DC

## 2020-07-23 MED ORDER — TRIAMTERENE-HCTZ 37.5-25 MG PO TABS: 1.0000 | ORAL_TABLET | Freq: Every day | ORAL | 1 refills | Status: DC

## 2020-07-23 MED ORDER — MELOXICAM 15 MG PO TABS: 1.0000 | ORAL_TABLET | Freq: Every day | ORAL | 1 refills | Status: DC

## 2020-07-23 MED ORDER — OMEPRAZOLE 40 MG PO CPDR: 40.0000 mg | DELAYED_RELEASE_CAPSULE | Freq: Every day | ORAL | 1 refills | Status: DC

## 2020-07-23 MED ORDER — ROPINIROLE HCL 1 MG PO TABS: 1.0000 mg | ORAL_TABLET | Freq: Every day | ORAL | 5 refills | Status: DC

## 2020-07-23 MED ORDER — QUETIAPINE FUMARATE 50 MG PO TABS: 50.0000 mg | ORAL_TABLET | Freq: Every day | ORAL | 1 refills | Status: DC

## 2020-07-23 MED ORDER — VITAMIN D (ERGOCALCIFEROL) 1.25 MG (50000 UNIT) PO CAPS: 50000.0000 [IU] | ORAL_CAPSULE | ORAL | 3 refills | Status: AC

## 2020-07-23 MED ORDER — CLORAZEPATE DIPOTASSIUM 3.75 MG PO TABS: 3.7500 mg | ORAL_TABLET | Freq: Every day | ORAL | 5 refills | Status: DC

## 2020-07-23 MED ORDER — AMLODIPINE BESYLATE 5 MG PO TABS: 5.0000 mg | ORAL_TABLET | Freq: Every day | ORAL | 1 refills | Status: DC

## 2020-07-23 MED ORDER — ROSUVASTATIN CALCIUM 20 MG PO TABS: 20.0000 mg | ORAL_TABLET | Freq: Every day | ORAL | 1 refills | Status: DC

## 2020-07-23 MED ORDER — DULOXETINE HCL 60 MG PO CPEP: 60.0000 mg | ORAL_CAPSULE | Freq: Two times a day (BID) | ORAL | 1 refills | Status: DC

## 2020-07-23 NOTE — Addendum Note (Signed)
Addended by: Baldomero Lamy B on: 07/23/2020 08:52 AM   Modules accepted: Orders

## 2020-07-23 NOTE — Progress Notes (Signed)
Subjective:  Patient ID: Leslie Robbins, female    DOB: [birth year]  Age: 65 y.o. MRN: [id number]  CC: Medical Management of Chronic Issues   HPI Leslie Robbins presents for low back pain for two months. Taking care of a three year old, a teen ager. OVerwhelmed.   follow-up on  thyroid. The patient has a history of hypothyroidism for many years. It has been stable recently. Pt. denies any change in  voice, loss of hair, heat or cold intolerance. Energy level has been adequate to good. Patient denies constipation and diarrhea. No myxedema. Medication is as noted below. Verified that pt is taking it daily on an empty stomach. Well tolerated.  Leg cramps during the night 2-3 times a week. Gets so sore that itcan last 2-3 days. Feet tingle.   presents for  follow-up of hypertension. Patient has no history of headache chest pain or shortness of breath or recent cough. Patient also denies symptoms of TIA such as focal numbness or weakness. Patient denies side effects from medication. States taking it regularly.   Depression screen [facility] 2/9 07/23/2020 07/23/2020 06/15/2020  Decreased Interest 1 0 0  Down, Depressed, Hopeless 0 0 0  PHQ - 2 Score 1 0 0  Altered sleeping 1 - -  Tired, decreased energy 2 - -  Change in appetite 2 - -  Feeling bad or failure about yourself  0 - -  Trouble concentrating 0 - -  Moving slowly or fidgety/restless 0 - -  Suicidal thoughts 0 - -  PHQ-9 Score 6 - -  Difficult doing work/chores Not difficult at all - -  Some recent data might be hidden    History Leslie Robbins has a past medical history of Anxiety, Arthritis, Depression, GERD (gastroesophageal reflux disease), History of hiatal hernia, Hypercholesteremia, Hypertension, and Hypothyroidism.   She has a past surgical history that includes Abdominal hysterectomy; Tubal ligation; Right Carpal Tunnel Release; Breast lumpectomy; Colonoscopy (N/A, 05/30/2012); Dilation and curettage of uterus; Cardiac catheterization; Total  knee arthroplasty (Left, 09/16/2013); Joint replacement (Right); Total shoulder arthroplasty (Right, 06/17/2016); and Total shoulder arthroplasty (Right, 06/17/2016).   Her family history includes Bipolar disorder in her daughter; Dementia in her mother; Depression in her sister; Drug abuse in her daughter; Fibromyalgia in her sister; Hypertension in her mother; Lupus in her mother.She reports that she has never smoked. She has never used smokeless tobacco. She reports that she does not drink alcohol and does not use drugs.    ROS Review of Systems  Constitutional: Negative.   HENT: Positive for ear pain.   Eyes: Negative for visual disturbance.  Respiratory: Negative for shortness of breath.   Cardiovascular: Negative for chest pain.  Gastrointestinal: Negative for abdominal pain.  Musculoskeletal: Negative for arthralgias.    Objective:  BP 117/81   Pulse 69   Temp 98 F (36.7 C)   Ht 5\' 4"  (1.626 m)   Wt 201 lb 12.8 oz (91.5 kg)   SpO2 97%   BMI 34.64 kg/m   BP Readings from Last 3 Encounters:  07/23/20 117/81  01/28/20 129/84  09/26/19 121/79    Wt Readings from Last 3 Encounters:  07/23/20 201 lb 12.8 oz (91.5 kg)  06/15/20 203 lb (92.1 kg)  01/28/20 203 lb 8 oz (92.3 kg)     Physical Exam Constitutional:      General: She is not in acute distress.    Appearance: She is well-developed.  HENT:     Head: Normocephalic and  atraumatic.     Right Ear: Tympanic membrane normal.     Left Ear: Tympanic membrane normal.  Eyes:     Conjunctiva/sclera: Conjunctivae normal.     Pupils: Pupils are equal, round, and reactive to light.  Neck:     Thyroid: No thyromegaly.  Cardiovascular:     Rate and Rhythm: Normal rate and regular rhythm.     Heart sounds: Normal heart sounds. No murmur heard.   Pulmonary:     Effort: Pulmonary effort is normal. No respiratory distress.     Breath sounds: Normal breath sounds. No wheezing or rales.  Abdominal:     General: Bowel  sounds are normal. There is no distension.     Palpations: Abdomen is soft.     Tenderness: There is no abdominal tenderness.  Musculoskeletal:        General: Normal range of motion.     Cervical back: Normal range of motion and neck supple.  Lymphadenopathy:     Cervical: No cervical adenopathy.  Skin:    General: Skin is warm and dry.  Neurological:     Mental Status: She is alert and oriented to person, place, and time.  Psychiatric:        Behavior: Behavior normal.        Thought Content: Thought content normal.        Judgment: Judgment normal.       Assessment & Plan:   Leslie Robbins was seen today for medical management of chronic issues.  Diagnoses and all orders for this visit:  Chronic bilateral low back pain with bilateral sciatica -     Ambulatory referral to Physical Therapy -     CBC with Differential/Platelet -     CMP14+EGFR  Essential hypertension -     amLODipine (NORVASC) 5 MG tablet; Take 1 tablet (5 mg total) by mouth daily. -     triamterene-hydrochlorothiazide (MAXZIDE-25) 37.5-25 MG tablet; Take 1 tablet by mouth daily. As needed for swelling -     CBC with Differential/Platelet -     CMP14+EGFR  Depression with anxiety -     DULoxetine (CYMBALTA) 60 MG capsule; Take 1 capsule (60 mg total) by mouth 2 (two) times daily. -     clorazepate (TRANXENE) 3.75 MG tablet; Take 1 tablet (3.75 mg total) by mouth at bedtime. -     CBC with Differential/Platelet -     CMP14+EGFR  Hypothyroidism, unspecified type -     levothyroxine (SYNTHROID) 112 MCG tablet; Take 1 tablet (112 mcg total) by mouth daily. -     CBC with Differential/Platelet -     CMP14+EGFR -     TSH + free T4  Primary osteoarthritis of left knee -     meloxicam (MOBIC) 15 MG tablet; Take 1 tablet (15 mg total) by mouth daily. -     CBC with Differential/Platelet -     CMP14+EGFR  Gastroesophageal reflux disease without esophagitis -     omeprazole (PRILOSEC) 40 MG capsule; Take 1 capsule  (40 mg total) by mouth daily. -     CBC with Differential/Platelet -     CMP14+EGFR  Hyperlipidemia with target LDL less than 100 -     rosuvastatin (CRESTOR) 20 MG tablet; Take 1 tablet (20 mg total) by mouth daily. -     CBC with Differential/Platelet -     CMP14+EGFR -     Lipid panel  Vitamin D deficiency -     VITAMIN D  25 Hydroxy (Vit-D Deficiency, Fractures)  Other orders -     QUEtiapine (SEROQUEL) 50 MG tablet; Take 1 tablet (50 mg total) by mouth at bedtime. -     Vitamin D, Ergocalciferol, (DRISDOL) 1.25 MG (50000 UNIT) CAPS capsule; Take 1 capsule (50,000 Units total) by mouth every 7 (seven) days. -     rOPINIRole (REQUIP) 1 MG tablet; Take 1 tablet (1 mg total) by mouth at bedtime. For leg cramps       I have changed Elverda W. Stead's DULoxetine, levothyroxine, meloxicam, and QUEtiapine. I am also having her start on rOPINIRole. Additionally, I am having her maintain her aspirin EC, cetirizine, Calcium Carb-Cholecalciferol, amoxicillin, fexofenadine-pseudoephedrine, estradiol, Clinpro 5000, amLODipine, clorazepate, omeprazole, rosuvastatin, triamterene-hydrochlorothiazide, and Vitamin D (Ergocalciferol).  Allergies as of 07/23/2020      Reactions   Sulfonamide Derivatives Rash   SULFONAMIDES INCREASE SENSITIVITY   TO SUNLIGHT > MORE LIKELY TO SUNBURN SUNBURN-LIKE REDNESS      Medication List       Accurate as of Jul 23, 2020  8:37 AM. If you have any questions, ask your nurse or doctor.        amLODipine 5 MG tablet Commonly known as: NORVASC Take 1 tablet (5 mg total) by mouth daily.   amoxicillin 500 MG tablet Commonly known as: AMOXIL Take 4 tablets (2,000 mg total) by mouth once as needed for up to 1 dose (one hour before dental procedure).   aspirin EC 81 MG tablet Take 81 mg by mouth daily. WILL STOP PRIOR TO PROCEDURE   Calcium Carb-Cholecalciferol 600-200 MG-UNIT Tabs Take by mouth.   cetirizine 10 MG tablet Commonly known as: ZYRTEC Take 10  mg by mouth daily as needed for allergies.   Clinpro 5000 1.1 % Pste Generic drug: Sodium Fluoride Place onto teeth at bedtime.   clorazepate 3.75 MG tablet Commonly known as: TRANXENE Take 1 tablet (3.75 mg total) by mouth at bedtime.   DULoxetine 60 MG capsule Commonly known as: CYMBALTA Take 1 capsule (60 mg total) by mouth 2 (two) times daily.   estradiol 0.1 MG/GM vaginal cream Commonly known as: ESTRACE PLACE 2 GRAMS VAGINALLY AS DIRECTED EVERY OTHER NIGHT   fexofenadine-pseudoephedrine 180-240 MG 24 hr tablet Commonly known as: ALLEGRA-D 24 Take 1 tablet by mouth every evening. For allergy and congestion   levothyroxine 112 MCG tablet Commonly known as: SYNTHROID Take 1 tablet (112 mcg total) by mouth daily.   meloxicam 15 MG tablet Commonly known as: MOBIC Take 1 tablet (15 mg total) by mouth daily.   omeprazole 40 MG capsule Commonly known as: PRILOSEC Take 1 capsule (40 mg total) by mouth daily.   QUEtiapine 50 MG tablet Commonly known as: SEROQUEL Take 1 tablet (50 mg total) by mouth at bedtime. What changed: See the new instructions. Changed by: Claretta Fraise, MD   rOPINIRole 1 MG tablet Commonly known as: REQUIP Take 1 tablet (1 mg total) by mouth at bedtime. For leg cramps Started by: Claretta Fraise, MD   rosuvastatin 20 MG tablet Commonly known as: CRESTOR Take 1 tablet (20 mg total) by mouth daily.   triamterene-hydrochlorothiazide 37.5-25 MG tablet Commonly known as: MAXZIDE-25 Take 1 tablet by mouth daily. As needed for swelling   Vitamin D (Ergocalciferol) 1.25 MG (50000 UNIT) Caps capsule Commonly known as: DRISDOL Take 1 capsule (50,000 Units total) by mouth every 7 (seven) days.        Follow-up: Return in about 6 months (around 01/23/2021).  Claretta Fraise, M.D.

## 2020-07-24 LAB — CBC WITH DIFFERENTIAL/PLATELET
Basophils Absolute: 0.1 10*3/uL (ref 0.0–0.2)
Basos: 1 %
EOS (ABSOLUTE): 0.5 10*3/uL — ABNORMAL HIGH (ref 0.0–0.4)
Eos: 9 %
Hematocrit: 37.9 % (ref 34.0–46.6)
Hemoglobin: 12.4 g/dL (ref 11.1–15.9)
Immature Grans (Abs): 0 10*3/uL (ref 0.0–0.1)
Immature Granulocytes: 0 %
Lymphocytes Absolute: 1.3 10*3/uL (ref 0.7–3.1)
Lymphs: 23 %
MCH: 27.6 pg (ref 26.6–33.0)
MCHC: 32.7 g/dL (ref 31.5–35.7)
MCV: 84 fL (ref 79–97)
Monocytes Absolute: 0.4 10*3/uL (ref 0.1–0.9)
Monocytes: 8 %
Neutrophils Absolute: 3.4 10*3/uL (ref 1.4–7.0)
Neutrophils: 59 %
Platelets: 205 10*3/uL (ref 150–450)
RBC: 4.5 x10E6/uL (ref 3.77–5.28)
RDW: 14.3 % (ref 11.7–15.4)
WBC: 5.6 10*3/uL (ref 3.4–10.8)

## 2020-07-24 LAB — CMP14+EGFR
ALT: 27 IU/L (ref 0–32)
AST: 28 IU/L (ref 0–40)
Albumin/Globulin Ratio: 1.5 (ref 1.2–2.2)
Albumin: 4 g/dL (ref 3.8–4.8)
Alkaline Phosphatase: 191 IU/L — ABNORMAL HIGH (ref 44–121)
BUN/Creatinine Ratio: 19 (ref 12–28)
BUN: 24 mg/dL (ref 8–27)
Bilirubin Total: 0.5 mg/dL (ref 0.0–1.2)
CO2: 22 mmol/L (ref 20–29)
Calcium: 10 mg/dL (ref 8.7–10.3)
Chloride: 103 mmol/L (ref 96–106)
Creatinine, Ser: 1.24 mg/dL — ABNORMAL HIGH (ref 0.57–1.00)
Globulin, Total: 2.7 g/dL (ref 1.5–4.5)
Glucose: 88 mg/dL (ref 65–99)
Potassium: 4.2 mmol/L (ref 3.5–5.2)
Sodium: 142 mmol/L (ref 134–144)
Total Protein: 6.7 g/dL (ref 6.0–8.5)
eGFR: 48 mL/min/{1.73_m2} — ABNORMAL LOW (ref >59)

## 2020-07-24 LAB — TSH+FREE T4
Free T4: 1.32 ng/dL (ref 0.82–1.77)
TSH: 1.02 u[IU]/mL (ref 0.450–4.500)

## 2020-07-24 LAB — LIPID PANEL
Chol/HDL Ratio: 2.3 ratio (ref 0.0–4.4)
Cholesterol, Total: 111 mg/dL (ref 100–199)
HDL: 48 mg/dL (ref >39)
LDL Chol Calc (NIH): 47 mg/dL (ref 0–99)
Triglycerides: 79 mg/dL (ref 0–149)
VLDL Cholesterol Cal: 16 mg/dL (ref 5–40)

## 2020-07-24 LAB — VITAMIN D 25 HYDROXY (VIT D DEFICIENCY, FRACTURES): Vit D, 25-Hydroxy: 45.8 ng/mL (ref 30.0–100.0)

## 2020-07-26 NOTE — Progress Notes (Signed)
Hello Lyrica,  Your lab result is normal and/or stable.Some minor variations that are not significant are commonly marked abnormal, but do not represent any medical problem for you.  Best regards, Claretta Fraise, M.D.

## 2020-07-27 ENCOUNTER — Ambulatory Visit: Payer: Medicare HMO | Admitting: Family Medicine

## 2020-07-31 ENCOUNTER — Other Ambulatory Visit: Payer: Self-pay | Admitting: Nurse Practitioner

## 2020-07-31 DIAGNOSIS — Z1231 Encounter for screening mammogram for malignant neoplasm of breast: Secondary | ICD-10-CM

## 2020-08-10 ENCOUNTER — Ambulatory Visit: Payer: Medicare HMO | Attending: Family Medicine | Admitting: Physical Therapy

## 2020-08-10 ENCOUNTER — Other Ambulatory Visit: Payer: Self-pay

## 2020-08-10 ENCOUNTER — Encounter: Payer: Self-pay | Admitting: Physical Therapy

## 2020-08-10 DIAGNOSIS — M5442 Lumbago with sciatica, left side: Secondary | ICD-10-CM | POA: Diagnosis not present

## 2020-08-10 NOTE — Therapy (Signed)
[facility] [place], [place], [address] Phone: [phone]   Fax:  [phone]  Physical Therapy Evaluation  Patient Details  Name: Leslie Robbins MRN: [id number] Date of Birth: 12-22-1955 Referring Provider (PT): Nino Parsley MD.   Encounter Date: 08/10/2020   PT End of Session - 08/10/20 0956    Visit Number 1    Number of Visits 12    Date for PT Re-Evaluation 09/21/20    Authorization Type FOTO AT LEAST EVERY 5TH VISIT.  PROGRESS NOTE AT 10TH VISIT.  KX MODIFIER AFTER 15 VISITS.    PT Start Time 0900    PT Stop Time 0945    PT Time Calculation (min) 45 min    Activity Tolerance Patient tolerated treatment well    Behavior During Therapy WFL for tasks assessed/performed           Past Medical History:  Diagnosis Date  . Anxiety   . Arthritis   . Depression   . GERD (gastroesophageal reflux disease)   . History of hiatal hernia   . Hypercholesteremia   . Hypertension   . Hypothyroidism     Past Surgical History:  Procedure Laterality Date  . ABDOMINAL HYSTERECTOMY    . BREAST LUMPECTOMY     benign  . CARDIAC CATHETERIZATION     9 YRS AGO  . COLONOSCOPY N/A 05/30/2012   Procedure: COLONOSCOPY;  Surgeon: Rogene Houston, MD;  [facility];  Service: Endoscopy;  Laterality: N/A;  830-moved to 1125 Ann to notify pt  . DILATION AND CURETTAGE OF UTERUS     X2  . JOINT REPLACEMENT Right    right hip  . Right Carpal Tunnel Release    . 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   Procedure: LEFT TOTAL KNEE ARTHROPLASTY;  Surgeon: Gearlean Alf, MD;  Location: [facility];  Service: Orthopedics;  Laterality: Left;  . TOTAL SHOULDER ARTHROPLASTY Right 06/17/2016  . TOTAL SHOULDER ARTHROPLASTY Right 06/17/2016   Procedure: RIGHT TOTAL SHOULDER ARTHROPLASTY;  Surgeon: Netta Cedars, MD;  Location: [facility];  Service: Orthopedics;  Laterality: Right;  . TUBAL LIGATION      There were no vitals filed for this  visit.    Subjective Assessment - 08/10/20 0950    Subjective COVID-19 screen performed prior to patient entering clinic.  The patient presenst to the clinic today with c/o bilateral low back pain but mostly on the left with radiation into her left buttock, posterior thigh and occasionally to her left calf.  The has been ongoing for about 2 1/2 months for no apparnebt reason.  Her pain at rest today is a 6/10 but can rise to much higher levels especially with walking.  Sitting and resting decrease her pain.    Pertinent History Bilateral TKA's, right THA, right TSA, OA, h/o hiatal hernia, HTN, Hypothyroidism.    How long can you walk comfortably? Short community distances.    Patient Stated Goals Get out of pain.    Currently in Pain? Yes    Pain Score 6     Pain Location Back    Pain Orientation Left    Pain Descriptors / Indicators Aching;Sore    Pain Type Acute pain    Pain Onset More than a month ago    Pain Frequency Constant    Aggravating Factors  See above.    Pain Relieving Factors See above.              [facility]  PT Assessment - 08/10/20 0001      Assessment   Medical Diagnosis Chronic bilateral LBP with bilateral sciatica    Referring Provider (PT) Nino Parsley MD.    Onset Date/Surgical Date --   ~2 1/2 months.     Precautions   Precautions None      Restrictions   Weight Bearing Restrictions No      Balance Screen   Has the patient fallen in the past 6 months No    Has the patient had a decrease in activity level because of a fear of falling?  No    Is the patient reluctant to leave their home because of a fear of falling?  No      Home Ec[employer] residence      Prior Function   Level of Independence Independent      Posture/Postural Control   Posture/Postural Control No significant limitations      ROM / Strength   AROM / PROM / Strength AROM;Strength      AROM   Overall AROM Comments Full active lumbar flexion and  extension.      Strength   Overall Strength Comments Normal LE strength.      Palpation   Palpation comment Tender to palpation over left QL and very tennder to palpation over her left SIJ.      Special Tests   Other special tests Left LE slight shorter than right.  (-) SLR testing.      Ambulation/Gait   Gait Comments Antalgic gait pattern.                      Objective measurements completed on examination: See above findings.       [facility] Adult PT Treatment/Exercise - 08/10/20 0001      Modalities   Modalities Electrical Stimulation;Moist Heat      Moist Heat Therapy   Number Minutes Moist Heat 20 Minutes    Moist Heat Location Lumbar Spine      Electrical Stimulation   Electrical Stimulation Location Left low back/SIJ region.    Electrical Stimulation Action IFC at 80-150 Hz.    Electrical Stimulation Parameters 20% scan x 20 minutes.    Electrical Stimulation Goals Pain                       PT Long Term Goals - 08/10/20 1036      PT LONG TERM GOAL #1   Title Independent with a HEP.    Time 6    Period Weeks    Status New      PT LONG TERM GOAL #2   Title Perform ADL's with pain not > 2-3/10.    Time 6    Period Weeks    Status New      PT LONG TERM GOAL #3   Title Eliminate left LE symptoms.    Time 6    Period Weeks    Status New                  Plan - 08/10/20 1028    Clinical Impression Statement The patient presents to [facility] with a CC of left sided low back pain with radiation into her left buttock, posterior thigh and occasionally to her left calf.  Her pain increases to very high levels with increased walking.  She is tender to palption over her left QL and especially her left SIJ.  She has a slight leg length discrepancy (left shorter).  Her ability to perform ADL's is impaired to due to pain.  Patient will benefit from skilled physical therapy intervention to address pain and deficits.    Personal Factors and  Comorbidities Comorbidity 1;Comorbidity 2    Comorbidities Bilateral TKA's, right THA, right TSA, OA, h/o hiatal hernia, HTN, Hypothyroidism.    Examination-Activity Limitations Other;Locomotion Level    Examination-Participation Restrictions Other    Clinical Decision Making Low    Rehab Potential Excellent    PT Frequency 2x / week    PT Duration 6 weeks    PT Treatment/Interventions ADLs/Self Care Home Management;Electrical Stimulation;Functional mobility training;Therapeutic activities;Therapeutic exercise;Manual techniques;Patient/family education;Passive range of motion;Dry needling    PT Next Visit Plan Core exercise progression.  Combo e'stim/US and STW/M to patient's left low back and SIJ.    Consulted and Agree with Plan of Care Patient           Patient will benefit from skilled therapeutic intervention in order to improve the following deficits and impairments:  Abnormal gait,Pain,Decreased activity tolerance,Increased muscle spasms  Visit Diagnosis: Acute left-sided low back pain with left-sided sciatica     Problem List Patient Active Problem List   Diagnosis Date Noted  . History of total knee replacement, right 05/09/2017  . Pain management contract signed 02/06/2017  . Degenerative arthritis of right shoulder region 06/18/2016  . S/P shoulder replacement, right 06/17/2016  . Bursitis, hip 07/27/2015  . Lichen sclerosus of female genitalia 12/30/2013  . OA (osteoarthritis) of knee 09/16/2013  . GERD (gastroesophageal reflux disease) 07/11/2013  . GAD (generalized anxiety disorder) 07/11/2013  . Depression with anxiety 07/11/2013  . Hyperlipidemia with target LDL less than 100 07/11/2013  . Hypothyroidism 07/11/2013  . Essential hypertension 12/19/2008    Evette Diclemente, [place] MPT 08/10/2020, 10:37 AM  [facility] [address] [place], [place], [address] Phone: [phone]   Fax:  [phone]  Name: DOTTY GONZALO MRN: [id number] Date of Birth: 06/16/1955

## 2020-08-13 ENCOUNTER — Ambulatory Visit: Payer: Medicare HMO | Admitting: Physical Therapy

## 2020-08-13 ENCOUNTER — Other Ambulatory Visit: Payer: Self-pay

## 2020-08-13 DIAGNOSIS — M5442 Lumbago with sciatica, left side: Secondary | ICD-10-CM | POA: Diagnosis not present

## 2020-08-13 NOTE — Therapy (Signed)
[facility] [name], [place], [address] Phone: [phone]   Fax:  [phone]  Physical Therapy Treatment  Patient Details  Name: Leslie Robbins MRN: [id number] Date of Birth: 11/22/1955 Referring Provider (PT): Leslie Parsley MD.   Encounter Date: 08/13/2020   PT End of Session - 08/13/20 0938     Visit Number 2    Number of Visits 12    Date for PT Re-Evaluation 09/21/20    Authorization Type FOTO AT LEAST EVERY 5TH VISIT.  PROGRESS NOTE AT 10TH VISIT.  KX MODIFIER AFTER 15 VISITS.    PT Start Time 0900    PT Stop Time 0951    PT Time Calculation (min) 51 min    Activity Tolerance Patient tolerated treatment well    Behavior During Therapy WFL for tasks assessed/performed             Past Medical History:  Diagnosis Date   Anxiety    Arthritis    Depression    GERD (gastroesophageal reflux disease)    History of hiatal hernia    Hypercholesteremia    Hypertension    Hypothyroidism     Past Surgical History:  Procedure Laterality Date   ABDOMINAL HYSTERECTOMY     BREAST LUMPECTOMY     benign   CARDIAC CATHETERIZATION     9 YRS AGO   COLONOSCOPY N/A 05/30/2012   Procedure: COLONOSCOPY;  Surgeon: Leslie Houston, MD;  [facility];  Service: Endoscopy;  Laterality: N/A;  830-moved to 1125 Ann to notify pt   DILATION AND CURETTAGE OF UTERUS     X2   JOINT REPLACEMENT Right    right hip   Right Carpal Tunnel Release     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   Procedure: LEFT TOTAL KNEE ARTHROPLASTY;  Surgeon: Leslie Alf, MD;  [facility];  Service: Orthopedics;  Laterality: Left;   TOTAL SHOULDER ARTHROPLASTY Right 06/17/2016   TOTAL SHOULDER ARTHROPLASTY Right 06/17/2016   Procedure: RIGHT TOTAL SHOULDER ARTHROPLASTY;  Surgeon: Netta Cedars, MD;  Location: [facility];  Service: Orthopedics;  Laterality: Right;   TUBAL LIGATION      There were no vitals filed for this visit.    Subjective Assessment - 08/13/20 0926     Subjective COVID-19 screen performed prior to patient entering clinic.  Patient arrived with some ongoing pain in left low back.    Pertinent History Bilateral TKA's, right THA, right TSA, OA, h/o hiatal hernia, HTN, Hypothyroidism.    How long can you walk comfortably? Short community distances.    Patient Stated Goals Get out of pain.    Currently in Pain? Yes    Pain Score 4     Pain Location Back    Pain Orientation Left    Pain Descriptors / Indicators Discomfort;Sore    Pain Type Acute pain    Pain Onset More than a month ago    Pain Frequency Constant    Aggravating Factors  at night and in AM    Pain Relieving Factors movement                               [facility] Adult PT Treatment/Exercise - 08/13/20 0001       Exercises   Exercises Lumbar      Lumbar Exercises: Stretches   Single Knee to Chest Stretch Left;3 reps;30 seconds    Other Lumbar  Stretch Exercise rev muscle technique Lt LE 5sec x5reps      Lumbar Exercises: Aerobic   Nustep L3 x19min UE/LE posture focus      Lumbar Exercises: Supine   Ab Set 20 reps;5 seconds    Glut Set 10 reps;5 seconds    Clam 20 reps;3 seconds   with red band and bracing   Bent Knee Raise 5 seconds   2x10 with bracing   Bridge 10 reps    Straight Leg Raise 3 seconds   2x10 with bracing   Other Supine Lumbar Exercises ball squeeze  10sec x20                    PT Education - 08/13/20 0940     Education Details HEP    Person(s) Educated Patient    Methods Explanation;Demonstration;Handout    Comprehension Verbalized understanding;Returned demonstration                 PT Long Term Goals - 08/13/20 2951       PT LONG TERM GOAL #1   Title Independent with a HEP.    Time 6    Period Weeks    Status On-going      PT LONG TERM GOAL #2   Title Perform ADL's with pain not > 2-3/10.    Time 6    Period Weeks    Status On-going      PT LONG  TERM GOAL #3   Title Eliminate left LE symptoms.    Time 6    Period Weeks    Status On-going      PT LONG TERM GOAL #4   Time 8                   Plan - 08/13/20 0942     Clinical Impression Statement Patient tolerated treatment well today patient understands abdominal bracing and progression. Patient able to progress today with exercises and activities to help increase core strength and decrease pain in left low back. Patient has more pain at night and in the AM and relief with movement and activity. Discussed sleeping techniques for comfort. HEP provided today. Goals progressing.    Personal Factors and Comorbidities Comorbidity 1;Comorbidity 2    Comorbidities Bilateral TKA's, right THA, right TSA, OA, h/o hiatal hernia, HTN, Hypothyroidism.    Examination-Activity Limitations Other;Locomotion Level    Examination-Participation Restrictions Other    Rehab Potential Excellent    PT Frequency 2x / week    PT Duration 6 weeks    PT Treatment/Interventions ADLs/Self Care Home Management;Electrical Stimulation;Functional mobility training;Therapeutic activities;Therapeutic exercise;Manual techniques;Patient/family education;Passive range of motion;Dry needling    PT Next Visit Plan Core exercise progression.  Combo e'stim/US and STW/M to patient's left low back and SIJ.    Consulted and Agree with Plan of Care Patient             Patient will benefit from skilled therapeutic intervention in order to improve the following deficits and impairments:  Abnormal gait, Pain, Decreased activity tolerance, Increased muscle spasms  Visit Diagnosis: Acute left-sided low back pain with left-sided sciatica     Problem List Patient Active Problem List   Diagnosis Date Noted   History of total knee replacement, right 05/09/2017   Pain management contract signed 02/06/2017   Degenerative arthritis of right shoulder region 06/18/2016   S/P shoulder replacement, right 06/17/2016    Bursitis, hip 88/41/6606   Lichen sclerosus of female genitalia 12/30/2013  OA (osteoarthritis) of knee 09/16/2013   GERD (gastroesophageal reflux disease) 07/11/2013   GAD (generalized anxiety disorder) 07/11/2013   Depression with anxiety 07/11/2013   Hyperlipidemia with target LDL less than 100 07/11/2013   Hypothyroidism 07/11/2013   Essential hypertension 12/19/2008    Danis Pembleton P, PTA 08/13/2020, 10:03 AM  [facility] [place], [place], [address] Phone: [phone]   Fax:  [phone]  Name: Leslie Robbins MRN: [id number] Date of Birth: May 30, 1955

## 2020-08-13 NOTE — Patient Instructions (Signed)
Pelvic Tilt: Posterior - Legs Bent (Supine)  Tighten stomach and flatten back by rolling pelvis down. Hold _10___ seconds. Relax. Repeat _10-30___ times per set. Do __2__ sets per session. Do _2___ sessions per day.   Bent Leg Lift (Hook-Lying)  Tighten stomach and slowly raise right leg _5___ inches from floor. Keep trunk rigid. Hold _3___ seconds. Repeat _10___ times per set. Do ___2-3_ sets per session. Do __2__ sessions per day.   Straight Leg Raise  Tighten stomach and slowly raise locked right leg __4__ inches from floor. Repeat __10-30__ times per set. Do __2__ sets per session. Do __2__ sessions per day.    SINGLE KNEE TO CHEST STRETCH - [place]  While Lying on your back, raise your leg up and hold your thigh under your knee while gently pulling it towards your chest for a gentle stretch. Lower your leg down and repeat.

## 2020-08-17 ENCOUNTER — Ambulatory Visit: Payer: Medicare HMO | Admitting: Physical Therapy

## 2020-08-17 ENCOUNTER — Other Ambulatory Visit: Payer: Self-pay

## 2020-08-17 DIAGNOSIS — M5442 Lumbago with sciatica, left side: Secondary | ICD-10-CM | POA: Diagnosis not present

## 2020-08-17 NOTE — Therapy (Signed)
[facility] [place], [place], [address] Phone: [phone]   Fax:  [phone]  Physical Therapy Treatment  Patient Details  Name: Leslie Robbins MRN: [id number] Date of Birth: [birth year] Referring Provider (PT): Nino Parsley MD.   Encounter Date: 08/17/2020   PT End of Session - 08/17/20 0938     Visit Number 3    Number of Visits 12    Date for PT Re-Evaluation 09/21/20    Authorization Type FOTO AT LEAST EVERY 5TH VISIT.  PROGRESS NOTE AT 10TH VISIT.  KX MODIFIER AFTER 15 VISITS.    PT Start Time 0900    PT Stop Time 0943    PT Time Calculation (min) 43 min    Activity Tolerance Patient tolerated treatment well    Behavior During Therapy WFL for tasks assessed/performed             Past Medical History:  Diagnosis Date   Anxiety    Arthritis    Depression    GERD (gastroesophageal reflux disease)    History of hiatal hernia    Hypercholesteremia    Hypertension    Hypothyroidism     Past Surgical History:  Procedure Laterality Date   ABDOMINAL HYSTERECTOMY     BREAST LUMPECTOMY     benign   CARDIAC CATHETERIZATION     9 YRS AGO   COLONOSCOPY N/A 05/30/2012   Procedure: COLONOSCOPY;  Surgeon: Rogene Houston, MD;  [facility];  Service: Endoscopy;  Laterality: N/A;  830-moved to 1125 Ann to notify pt   DILATION AND CURETTAGE OF UTERUS     X2   JOINT REPLACEMENT Right    right hip   Right Carpal Tunnel Release     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   Procedure: LEFT TOTAL KNEE ARTHROPLASTY;  Surgeon: Gearlean Alf, MD;  Location: [facility];  Service: Orthopedics;  Laterality: Left;   TOTAL SHOULDER ARTHROPLASTY Right 06/17/2016   TOTAL SHOULDER ARTHROPLASTY Right 06/17/2016   Procedure: RIGHT TOTAL SHOULDER ARTHROPLASTY;  Surgeon: Netta Cedars, MD;  Location: [facility];  Service: Orthopedics;  Laterality: Right;   TUBAL LIGATION      There were no vitals filed for this visit.    Subjective Assessment - 08/17/20 0920     Subjective COVID-19 screen performed prior to patient entering clinic.  Patient reported ongoing symptoms yet in leg only    Pertinent History Bilateral TKA's, right THA, right TSA, OA, h/o hiatal hernia, HTN, Hypothyroidism.    How long can you walk comfortably? Short community distances.    Patient Stated Goals Get out of pain.    Currently in Pain? Yes    Pain Score 2     Pain Location Back    Pain Orientation Left;Right    Pain Descriptors / Indicators Aching    Pain Type Acute pain    Pain Radiating Towards buttock    Pain Onset More than a month ago    Pain Frequency Intermittent    Aggravating Factors  in the morning    Pain Relieving Factors movement                               [facility] Adult PT Treatment/Exercise - 08/17/20 0001       Lumbar Exercises: Stretches   Single Knee to Chest Stretch Left;3 reps;30 seconds;Right    Hip Flexor Stretch Left;Right;3 reps;20 seconds  Other Lumbar Stretch Exercise rev muscle technique Lt /Rt LE 10sec x5reps      Lumbar Exercises: Aerobic   Nustep L3 x62min UE/LE posture focus      Lumbar Exercises: Standing   Row Strengthening;Both;20 reps    Row Limitations Blue XTS    Shoulder Extension Strengthening;Both;20 reps    Shoulder Extension Limitations blue XTS      Lumbar Exercises: Supine   Clam 20 reps;3 seconds   with red band /bracing   Bent Knee Raise 3 seconds   with bracing 2x10   Bent Knee Raise Limitations then with 2# ball to opp knee for progression    Bridge 10 reps    Straight Leg Raise 3 seconds   with bracing 2x10   Other Supine Lumbar Exercises ball squeeze  10sec x20                         PT Long Term Goals - 08/13/20 1751       PT LONG TERM GOAL #1   Title Independent with a HEP.    Time 6    Period Weeks    Status On-going      PT LONG TERM GOAL #2   Title Perform ADL's with pain not > 2-3/10.    Time 6    Period  Weeks    Status On-going      PT LONG TERM GOAL #3   Title Eliminate left LE symptoms.    Time 6    Period Weeks    Status On-going      PT LONG TERM GOAL #4   Time 8                   Plan - 08/17/20 0938     Clinical Impression Statement Patient tolerated treatment well today. Patient has reported less symptoms in LE and just bil low back buttocks area today. Patient able to increase activity tolerance and progress with core progression. Patient current goals progresisng today.    Personal Factors and Comorbidities Comorbidity 1;Comorbidity 2    Comorbidities Bilateral TKA's, right THA, right TSA, OA, h/o hiatal hernia, HTN, Hypothyroidism.    Examination-Activity Limitations Other;Locomotion Level    Examination-Participation Restrictions Other    Rehab Potential Excellent    PT Frequency 2x / week    PT Duration 6 weeks    PT Treatment/Interventions ADLs/Self Care Home Management;Electrical Stimulation;Functional mobility training;Therapeutic activities;Therapeutic exercise;Manual techniques;Patient/family education;Passive range of motion;Dry needling    PT Next Visit Plan Core exercise progression.  Combo e'stim/US and STW/M to patient's left low back and SIJ.    Consulted and Agree with Plan of Care Patient             Patient will benefit from skilled therapeutic intervention in order to improve the following deficits and impairments:  Abnormal gait, Pain, Decreased activity tolerance, Increased muscle spasms  Visit Diagnosis: Acute left-sided low back pain with left-sided sciatica     Problem List Patient Active Problem List   Diagnosis Date Noted   History of total knee replacement, right 05/09/2017   Pain management contract signed 02/06/2017   Degenerative arthritis of right shoulder region 06/18/2016   S/P shoulder replacement, right 06/17/2016   Bursitis, hip 02/58/5277   Lichen sclerosus of female genitalia 12/30/2013   OA (osteoarthritis) of  knee 09/16/2013   GERD (gastroesophageal reflux disease) 07/11/2013   GAD (generalized anxiety disorder) 07/11/2013   Depression with anxiety  07/11/2013   Hyperlipidemia with target LDL less than 100 07/11/2013   Hypothyroidism 07/11/2013   Essential hypertension 12/19/2008    Kacie Huxtable P, PTA 08/17/2020, 9:44 AM  [facility] [place], [place], [address] Phone: [phone]   Fax:  [phone]  Name: Leslie Robbins MRN: [id number] Date of Birth: 03/18/1955

## 2020-08-20 ENCOUNTER — Encounter: Payer: Self-pay | Admitting: Physical Therapy

## 2020-08-20 ENCOUNTER — Ambulatory Visit: Payer: Medicare HMO | Admitting: Physical Therapy

## 2020-08-20 ENCOUNTER — Other Ambulatory Visit: Payer: Self-pay

## 2020-08-20 DIAGNOSIS — M5442 Lumbago with sciatica, left side: Secondary | ICD-10-CM | POA: Diagnosis not present

## 2020-08-20 NOTE — Therapy (Signed)
[facility] [place], [place], [address] Phone: [phone]   Fax:  [phone]  Physical Therapy Treatment  Patient Details  Name: Leslie Robbins MRN: [id number] Date of Birth: 1955-06-24 Referring Provider (PT): Nino Parsley MD.   Encounter Date: 08/20/2020   PT End of Session - 08/20/20 0908     Visit Number 4    Number of Visits 12    Date for PT Re-Evaluation 09/21/20    Authorization Type FOTO AT LEAST EVERY 5TH VISIT.  PROGRESS NOTE AT 10TH VISIT.  KX MODIFIER AFTER 15 VISITS.    PT Start Time 0902    PT Stop Time [phone]    PT Time Calculation (min) 40 min    Activity Tolerance Patient tolerated treatment well    Behavior During Therapy WFL for tasks assessed/performed             Past Medical History:  Diagnosis Date   Anxiety    Arthritis    Depression    GERD (gastroesophageal reflux disease)    History of hiatal hernia    Hypercholesteremia    Hypertension    Hypothyroidism     Past Surgical History:  Procedure Laterality Date   ABDOMINAL HYSTERECTOMY     BREAST LUMPECTOMY     benign   CARDIAC CATHETERIZATION     9 YRS AGO   COLONOSCOPY N/A 05/30/2012   Procedure: COLONOSCOPY;  Surgeon: Rogene Houston, MD;  [facility];  Service: Endoscopy;  Laterality: N/A;  830-moved to 1125 Ann to notify pt   DILATION AND CURETTAGE OF UTERUS     X2   JOINT REPLACEMENT Right    right hip   Right Carpal Tunnel Release     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   Procedure: LEFT TOTAL KNEE ARTHROPLASTY;  Surgeon: Gearlean Alf, MD;  Location: [facility];  Service: Orthopedics;  Laterality: Left;   TOTAL SHOULDER ARTHROPLASTY Right 06/17/2016   TOTAL SHOULDER ARTHROPLASTY Right 06/17/2016   Procedure: RIGHT TOTAL SHOULDER ARTHROPLASTY;  Surgeon: Netta Cedars, MD;  Location: [facility];  Service: Orthopedics;  Laterality: Right;   TUBAL LIGATION      There were no vitals filed for this visit.    Subjective Assessment - 08/20/20 0906     Subjective COVID-19 screen performed prior to patient entering clinic.  Patient reports feeling about the same and reports more pain after XTS training. Patient reports more tingling in B feet but mostly L foot. Tinglinig in low back or L side at times.    Pertinent History Bilateral TKA's, right THA, right TSA, OA, h/o hiatal hernia, HTN, Hypothyroidism.    How long can you walk comfortably? Short community distances.    Patient Stated Goals Get out of pain.    Currently in Pain? Yes    Pain Score 3     Pain Location Back    Pain Orientation Lower    Pain Descriptors / Indicators Tingling;Discomfort    Pain Type Acute pain    Pain Onset More than a month ago    Pain Frequency Intermittent                [facility] PT Assessment - 08/20/20 0001       Assessment   Medical Diagnosis Chronic bilateral LBP with bilateral sciatica    Referring Provider (PT) Nino Parsley MD.    Next MD Visit None      Precautions   Precautions None  Restrictions   Weight Bearing Restrictions No                           [facility] Adult PT Treatment/Exercise - 08/20/20 0001       Lumbar Exercises: Stretches   Single Knee to Chest Stretch Left;Right;3 reps;30 seconds    Lower Trunk Rotation 5 reps;10 seconds    Piriformis Stretch Left;3 reps;30 seconds      Lumbar Exercises: Aerobic   Nustep L3 x33min UE/LE posture focus      Lumbar Exercises: Supine   Dead Bug 10 reps;5 seconds;Limitations    Dead Bug Limitations ball press with opp arm/leg    Bridge 15 reps;3 seconds      Manual Therapy   Manual Therapy Manual Traction    Manual Traction Manual hip distraction x8 min for pain relief                         PT Long Term Goals - 08/13/20 5409       PT LONG TERM GOAL #1   Title Independent with a HEP.    Time 6    Period Weeks    Status On-going      PT LONG TERM GOAL #2   Title Perform ADL's with pain  not > 2-3/10.    Time 6    Period Weeks    Status On-going      PT LONG TERM GOAL #3   Title Eliminate left LE symptoms.    Time 6    Period Weeks    Status On-going      PT LONG TERM GOAL #4   Time 8                   Plan - 08/20/20 1103     Clinical Impression Statement Patient presented in clinic today with more tingling of B feet and into low back as well. Patient progressed through stretching low back and hips to reduce tightness. Greatest stretch notabe with LTR and figure 4 stretch. Patient experienced superior glute discomfort with bridging. Patient reported greatest pain relief with BLE long distraction.    Personal Factors and Comorbidities Comorbidity 1;Comorbidity 2    Comorbidities Bilateral TKA's, right THA, right TSA, OA, h/o hiatal hernia, HTN, Hypothyroidism.    Examination-Activity Limitations Other;Locomotion Level    Examination-Participation Restrictions Other    Rehab Potential Excellent    PT Frequency 2x / week    PT Duration 6 weeks    PT Treatment/Interventions ADLs/Self Care Home Management;Electrical Stimulation;Functional mobility training;Therapeutic activities;Therapeutic exercise;Manual techniques;Patient/family education;Passive range of motion;Dry needling    PT Next Visit Plan Core exercise progression.  Combo e'stim/US and STW/M to patient's left low back and SIJ.    Consulted and Agree with Plan of Care Patient             Patient will benefit from skilled therapeutic intervention in order to improve the following deficits and impairments:  Abnormal gait, Pain, Decreased activity tolerance, Increased muscle spasms  Visit Diagnosis: Acute left-sided low back pain with left-sided sciatica     Problem List Patient Active Problem List   Diagnosis Date Noted   History of total knee replacement, right 05/09/2017   Pain management contract signed 02/06/2017   Degenerative arthritis of right shoulder region 06/18/2016   S/P  shoulder replacement, right 06/17/2016   Bursitis, hip 81/19/1478   Lichen sclerosus of female  genitalia 12/30/2013   OA (osteoarthritis) of knee 09/16/2013   GERD (gastroesophageal reflux disease) 07/11/2013   GAD (generalized anxiety disorder) 07/11/2013   Depression with anxiety 07/11/2013   Hyperlipidemia with target LDL less than 100 07/11/2013   Hypothyroidism 07/11/2013   Essential hypertension 12/19/2008    Standley Brooking, PTA 08/20/2020, 11:26 AM  [facility] [address] [place], [place], [address] Phone: [phone]   Fax:  [phone]  Name: LIANNE CARRETO MRN: [id number] Date of Birth: 1955-10-09

## 2020-08-24 ENCOUNTER — Encounter: Payer: Self-pay | Admitting: Physical Therapy

## 2020-08-24 ENCOUNTER — Ambulatory Visit: Payer: Medicare HMO | Admitting: Physical Therapy

## 2020-08-24 ENCOUNTER — Other Ambulatory Visit: Payer: Self-pay

## 2020-08-24 DIAGNOSIS — M5442 Lumbago with sciatica, left side: Secondary | ICD-10-CM

## 2020-08-24 NOTE — Therapy (Signed)
[facility] [place], [place], [address] Phone: [phone]   Fax:  [phone]  Physical Therapy Treatment  Patient Details  Name: Leslie Robbins MRN: [id number] Date of Birth: 1956/01/30 Referring Provider (PT): Nino Parsley MD.   Encounter Date: 08/24/2020   PT End of Session - 08/24/20 1121     Visit Number 5    Number of Visits 12    Date for PT Re-Evaluation 09/21/20    Authorization Type FOTO AT LEAST EVERY 5TH VISIT.  PROGRESS NOTE AT 10TH VISIT.  KX MODIFIER AFTER 15 VISITS.    PT Start Time 1032    PT Stop Time 1116    PT Time Calculation (min) 44 min    Activity Tolerance Patient tolerated treatment well    Behavior During Therapy WFL for tasks assessed/performed             Past Medical History:  Diagnosis Date   Anxiety    Arthritis    Depression    GERD (gastroesophageal reflux disease)    History of hiatal hernia    Hypercholesteremia    Hypertension    Hypothyroidism     Past Surgical History:  Procedure Laterality Date   ABDOMINAL HYSTERECTOMY     BREAST LUMPECTOMY     benign   CARDIAC CATHETERIZATION     9 YRS AGO   COLONOSCOPY N/A 05/30/2012   Procedure: COLONOSCOPY;  Surgeon: Rogene Houston, MD;  [facility];  Service: Endoscopy;  Laterality: N/A;  830-moved to 1125 Ann to notify pt   DILATION AND CURETTAGE OF UTERUS     X2   JOINT REPLACEMENT Right    right hip   Right Carpal Tunnel Release     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   Procedure: LEFT TOTAL KNEE ARTHROPLASTY;  Surgeon: Gearlean Alf, MD;  [facility]: [facility];  Service: Orthopedics;  Laterality: Left;   TOTAL SHOULDER ARTHROPLASTY Right 06/17/2016   TOTAL SHOULDER ARTHROPLASTY Right 06/17/2016   Procedure: RIGHT TOTAL SHOULDER ARTHROPLASTY;  Surgeon: Netta Cedars, MD;  Location: [facility];  Service: Orthopedics;  Laterality: Right;   TUBAL LIGATION      There were no vitals filed for this visit.    Subjective Assessment - 08/24/20 1032     Subjective COVID-19 screen performed prior to patient entering clinic. Patient reports more LE related pain today.    Pertinent History Bilateral TKA's, right THA, right TSA, OA, h/o hiatal hernia, HTN, Hypothyroidism.    How long can you walk comfortably? Short community distances.    Patient Stated Goals Get out of pain.    Currently in Pain? Yes    Pain Score 2     Pain Location Leg    Pain Orientation Right;Left    Pain Descriptors / Indicators Dull;Aching    Pain Type Acute pain    Pain Onset More than a month ago    Pain Frequency Intermittent                [facility] PT Assessment - 08/24/20 0001       Assessment   Medical Diagnosis Chronic bilateral LBP with bilateral sciatica    Referring Provider (PT) Nino Parsley MD.    Next MD Visit None      Precautions   Precautions None      Restrictions   Weight Bearing Restrictions No  [facility] Adult PT Treatment/Exercise - 08/24/20 0001       Lumbar Exercises: Stretches   Passive Hamstring Stretch Right;Left;3 reps;20 seconds    Lower Trunk Rotation 5 reps;10 seconds    Piriformis Stretch Right;Left;3 reps;20 seconds    Other Lumbar Stretch Exercise Rockerboard x3 min for calf stretch      Lumbar Exercises: Aerobic   Nustep L3 x74min UE/LE posture focus      Lumbar Exercises: Supine   Bridge 10 reps;3 seconds      Lumbar Exercises: Sidelying   Clam Both;20 reps;3 seconds      Manual Therapy   Manual Therapy Manual Traction    Manual Traction Manual hip distraction x10 min for pain relief                         PT Long Term Goals - 08/13/20 5732       PT LONG TERM GOAL #1   Title Independent with a HEP.    Time 6    Period Weeks    Status On-going      PT LONG TERM GOAL #2   Title Perform ADL's with pain not > 2-3/10.    Time 6    Period Weeks    Status On-going      PT LONG TERM GOAL #3   Title  Eliminate left LE symptoms.    Time 6    Period Weeks    Status On-going      PT LONG TERM GOAL #4   Time 8                   Plan - 08/24/20 1122     Clinical Impression Statement Patient presenting with minimal improvement since beginning PT. Patient has her copper bracelet on today for the first time in a while. Patient states that wearing the copper bracelet does help the pain. Patient progressed through more stretching of LEs and hips. Cramping of B HS with bridging experienced today. Patient does feel some relief after long axis distraction of B hips.    Personal Factors and Comorbidities Comorbidity 1;Comorbidity 2    Comorbidities Bilateral TKA's, right THA, right TSA, OA, h/o hiatal hernia, HTN, Hypothyroidism.    Examination-Activity Limitations Other;Locomotion Level    Examination-Participation Restrictions Other    Rehab Potential Excellent    PT Frequency 2x / week    PT Duration 6 weeks    PT Treatment/Interventions ADLs/Self Care Home Management;Electrical Stimulation;Functional mobility training;Therapeutic activities;Therapeutic exercise;Manual techniques;Patient/family education;Passive range of motion;Dry needling    PT Next Visit Plan Core exercise progression.  Combo e'stim/US and STW/M to patient's left low back and SIJ.    Consulted and Agree with Plan of Care Patient             Patient will benefit from skilled therapeutic intervention in order to improve the following deficits and impairments:  Abnormal gait, Pain, Decreased activity tolerance, Increased muscle spasms  Visit Diagnosis: Acute left-sided low back pain with left-sided sciatica     Problem List Patient Active Problem List   Diagnosis Date Noted   History of total knee replacement, right 05/09/2017   Pain management contract signed 02/06/2017   Degenerative arthritis of right shoulder region 06/18/2016   S/P shoulder replacement, right 06/17/2016   Bursitis, hip 20/25/4270    Lichen sclerosus of female genitalia 12/30/2013   OA (osteoarthritis) of knee 09/16/2013   GERD (gastroesophageal reflux disease) 07/11/2013  GAD (generalized anxiety disorder) 07/11/2013   Depression with anxiety 07/11/2013   Hyperlipidemia with target LDL less than 100 07/11/2013   Hypothyroidism 07/11/2013   Essential hypertension 12/19/2008    Standley Brooking, PTA 08/24/2020, 11:25 AM  [facility] [place], [place], [address] Phone: [phone]   Fax:  [phone]  Name: Leslie Robbins MRN: [id number] Date of Birth: 23-Mar-1955

## 2020-08-27 ENCOUNTER — Other Ambulatory Visit: Payer: Self-pay

## 2020-08-27 ENCOUNTER — Ambulatory Visit: Payer: Medicare HMO | Admitting: Physical Therapy

## 2020-08-27 ENCOUNTER — Encounter: Payer: Self-pay | Admitting: Physical Therapy

## 2020-08-27 DIAGNOSIS — M5442 Lumbago with sciatica, left side: Secondary | ICD-10-CM

## 2020-08-27 NOTE — Therapy (Signed)
[facility] [place], [place], [address] Phone: [phone]   Fax:  [phone]  Physical Therapy Treatment  Patient Details  Name: Leslie Robbins MRN: [id number] Date of Birth: 26-Jan-1956 Referring Provider (PT): Nino Parsley MD.   Encounter Date: 08/27/2020   PT End of Session - 08/27/20 0902     Visit Number 6    Number of Visits 12    Date for PT Re-Evaluation 09/21/20    Authorization Type FOTO AT LEAST EVERY 5TH VISIT.  PROGRESS NOTE AT 10TH VISIT.  KX MODIFIER AFTER 15 VISITS.    PT Start Time 0901    Activity Tolerance Patient tolerated treatment well    Behavior During Therapy WFL for tasks assessed/performed             Past Medical History:  Diagnosis Date   Anxiety    Arthritis    Depression    GERD (gastroesophageal reflux disease)    History of hiatal hernia    Hypercholesteremia    Hypertension    Hypothyroidism     Past Surgical History:  Procedure Laterality Date   ABDOMINAL HYSTERECTOMY     BREAST LUMPECTOMY     benign   CARDIAC CATHETERIZATION     9 YRS AGO   COLONOSCOPY N/A 05/30/2012   Procedure: COLONOSCOPY;  Surgeon: Rogene Houston, MD;  Location: [facility];  Service: Endoscopy;  Laterality: N/A;  830-moved to 1125 Ann to notify pt   DILATION AND CURETTAGE OF UTERUS     X2   JOINT REPLACEMENT Right    right hip   Right Carpal Tunnel Release     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   Procedure: LEFT TOTAL KNEE ARTHROPLASTY;  Surgeon: Gearlean Alf, MD;  Location: [facility];  Service: Orthopedics;  Laterality: Left;   TOTAL SHOULDER ARTHROPLASTY Right 06/17/2016   TOTAL SHOULDER ARTHROPLASTY Right 06/17/2016   Procedure: RIGHT TOTAL SHOULDER ARTHROPLASTY;  Surgeon: Netta Cedars, MD;  Location: [facility];  Service: Orthopedics;  Laterality: Right;   TUBAL LIGATION      There were no vitals filed for this visit.   Subjective Assessment - 08/27/20 0901     Subjective COVID-19  screen performed prior to patient entering clinic. Sacral tenderness is gone now per patient report. Reports more aching in LE and buttocks region. By the end of the day pain is better than mornings.    Pertinent History Bilateral TKA's, right THA, right TSA, OA, h/o hiatal hernia, HTN, Hypothyroidism.    How long can you walk comfortably? Short community distances.    Patient Stated Goals Get out of pain.    Currently in Pain? Yes    Pain Score 2     Pain Location Buttocks    Pain Orientation Left    Pain Descriptors / Indicators Aching    Pain Type Acute pain    Pain Onset More than a month ago    Pain Frequency Intermittent                [facility] PT Assessment - 08/27/20 0001       Assessment   Medical Diagnosis Chronic bilateral LBP with bilateral sciatica    Referring Provider (PT) Nino Parsley MD.    Next MD Visit None      Precautions   Precautions None  [place] Adult PT Treatment/Exercise - 08/27/20 0001       Lumbar Exercises: Aerobic   Nustep L3 x10 min UE/LE posture focus      Modalities   Modalities Electrical Stimulation;Moist Heat      Moist Heat Therapy   Number Minutes Moist Heat 15 Minutes    Moist Heat Location Lumbar Spine      Electrical Stimulation   Electrical Stimulation Location L piriformis    Electrical Stimulation Action Pre-Mod    Electrical Stimulation Parameters 80-150 hz x15 min    Electrical Stimulation Goals Pain;Tone      Manual Therapy   Manual Therapy Soft tissue mobilization    Soft tissue mobilization STW/TPR to L glute, piriformis, superior HS to reduce tone and pain                    PT Education - 08/27/20 0949     Education Details self massage with ball, standing HS stretch    Person(s) Educated Patient    Methods Explanation;Demonstration    Comprehension Verbalized understanding;Returned demonstration                 PT Long Term Goals - 08/13/20  1287       PT LONG TERM GOAL #1   Title Independent with a HEP.    Time 6    Period Weeks    Status On-going      PT LONG TERM GOAL #2   Title Perform ADL's with pain not > 2-3/10.    Time 6    Period Weeks    Status On-going      PT LONG TERM GOAL #3   Title Eliminate left LE symptoms.    Time 6    Period Weeks    Status On-going      PT LONG TERM GOAL #4   Time 8                   Plan - 08/27/20 0942     Clinical Impression Statement Patient presented in clinic with continued ache of L glute especially. Patient able to tolerate therex but pain is constantly there even with ADLs. Stretching and strengthening have been attempted in previous PT session but no progress. Manual therapy to L glute and piriformis completed today with patient very tender along piriformis and superior glute. Patient educated verbally regarding self massage technique with a small ball in supine to L glute. Normal modalities response noted following removal of the modalities. Patient reported some relief upon standing at end of treatment with a cramp like sensation in posterior L thigh. Patient instructed in HS stretch in standing.    Personal Factors and Comorbidities Comorbidity 1;Comorbidity 2    Comorbidities Bilateral TKA's, right THA, right TSA, OA, h/o hiatal hernia, HTN, Hypothyroidism.    Examination-Activity Limitations Other;Locomotion Level    Examination-Participation Restrictions Other    Rehab Potential Excellent    PT Frequency 2x / week    PT Duration 6 weeks    PT Treatment/Interventions ADLs/Self Care Home Management;Electrical Stimulation;Functional mobility training;Therapeutic activities;Therapeutic exercise;Manual techniques;Patient/family education;Passive range of motion;Dry needling    PT Next Visit Plan Core exercise progression.  Combo e'stim/US and STW/M to patient's left low back and SIJ.    Consulted and Agree with Plan of Care Patient             Patient  will benefit from skilled therapeutic intervention in order to improve the following deficits and  impairments:  Abnormal gait, Pain, Decreased activity tolerance, Increased muscle spasms  Visit Diagnosis: Acute left-sided low back pain with left-sided sciatica     Problem List Patient Active Problem List   Diagnosis Date Noted   History of total knee replacement, right 05/09/2017   Pain management contract signed 02/06/2017   Degenerative arthritis of right shoulder region 06/18/2016   S/P shoulder replacement, right 06/17/2016   Bursitis, hip 01/03/1313   Lichen sclerosus of female genitalia 12/30/2013   OA (osteoarthritis) of knee 09/16/2013   GERD (gastroesophageal reflux disease) 07/11/2013   GAD (generalized anxiety disorder) 07/11/2013   Depression with anxiety 07/11/2013   Hyperlipidemia with target LDL less than 100 07/11/2013   Hypothyroidism 07/11/2013   Essential hypertension 12/19/2008    Standley Brooking, PTA 08/27/2020, 9:51 AM  [facility] [place], [place], [address] Phone: [phone]   Fax:  [phone]  Name: Leslie Robbins MRN: [id number] Date of Birth: 06/12/1955

## 2020-09-30 ENCOUNTER — Ambulatory Visit (INDEPENDENT_AMBULATORY_CARE_PROVIDER_SITE_OTHER): Payer: Medicare HMO

## 2020-09-30 ENCOUNTER — Other Ambulatory Visit: Payer: Self-pay | Admitting: Nurse Practitioner

## 2020-09-30 ENCOUNTER — Ambulatory Visit
Admission: RE | Admit: 2020-09-30 | Discharge: 2020-09-30 | Disposition: A | Discharge status: Home / Self Care | Payer: Medicare HMO | Source: Ambulatory Visit | Attending: Family Medicine | Admitting: Family Medicine

## 2020-09-30 ENCOUNTER — Other Ambulatory Visit: Payer: Self-pay

## 2020-09-30 DIAGNOSIS — Z1231 Encounter for screening mammogram for malignant neoplasm of breast: Secondary | ICD-10-CM | POA: Diagnosis not present

## 2020-09-30 DIAGNOSIS — Z780 Asymptomatic menopausal state: Secondary | ICD-10-CM

## 2020-09-30 DIAGNOSIS — M85852 Other specified disorders of bone density and structure, left thigh: Secondary | ICD-10-CM | POA: Diagnosis not present

## 2020-10-07 ENCOUNTER — Other Ambulatory Visit: Payer: Self-pay | Admitting: Family Medicine

## 2020-10-07 DIAGNOSIS — E039 Hypothyroidism, unspecified: Secondary | ICD-10-CM

## 2020-11-05 DIAGNOSIS — H5203 Hypermetropia, bilateral: Secondary | ICD-10-CM | POA: Diagnosis not present

## 2020-11-05 DIAGNOSIS — E7800 Pure hypercholesterolemia, unspecified: Secondary | ICD-10-CM | POA: Diagnosis not present

## 2020-11-11 ENCOUNTER — Other Ambulatory Visit: Payer: Self-pay | Admitting: Family Medicine

## 2021-01-13 ENCOUNTER — Ambulatory Visit (INDEPENDENT_AMBULATORY_CARE_PROVIDER_SITE_OTHER): Payer: Medicare HMO | Admitting: Nurse Practitioner

## 2021-01-13 ENCOUNTER — Encounter: Payer: Self-pay | Admitting: Nurse Practitioner

## 2021-01-13 ENCOUNTER — Other Ambulatory Visit: Payer: Self-pay

## 2021-01-13 VITALS — BP 132/86 | HR 77 | Temp 97.2°F | Resp 20 | Ht 64.0 in | Wt 210.0 lb

## 2021-01-13 DIAGNOSIS — R300 Dysuria: Secondary | ICD-10-CM

## 2021-01-13 DIAGNOSIS — J321 Chronic frontal sinusitis: Secondary | ICD-10-CM | POA: Insufficient documentation

## 2021-01-13 DIAGNOSIS — J0110 Acute frontal sinusitis, unspecified: Secondary | ICD-10-CM | POA: Diagnosis not present

## 2021-01-13 MED ORDER — AMOXICILLIN-POT CLAVULANATE 875-125 MG PO TABS: 1.0000 | ORAL_TABLET | Freq: Two times a day (BID) | ORAL | 0 refills | Status: DC

## 2021-01-13 NOTE — Assessment & Plan Note (Signed)
Patient presents with sinus pressure, congestion, chills and cough symptoms present in the past few days.  Patient's symptoms presents as frontal sinusitis  Started patient on Augmentin 875-125 mg tablet by mouth daily for 7 days. Increase hydration, Tylenol/ibuprofen for pain Education provided to patient with printed handouts given. Rx sent to pharmacy.  Follow-up with worsening unresolved symptoms.

## 2021-01-13 NOTE — Patient Instructions (Signed)
Dysuria Dysuria is pain or discomfort during urination. The pain or discomfort may be felt in the part of the body that drains urine from the bladder (urethra) or in the surrounding tissue of the genitals. The pain may also be felt in the groin area, lower abdomen, or lower back. You may have to urinate frequently or have the sudden feeling that you have to urinate (urgency). Dysuria can affect anyone, but it is more common in females. Dysuria can be caused by many different things, including: Urinary tract infection. Kidney stones or bladder stones. Certain STIs (sexually transmitted infections), such as chlamydia. Dehydration. Inflammation of the tissues of the vagina. Use of certain medicines. Use of certain soaps or scented products that cause irritation. Follow these instructions at home: Medicines Take over-the-counter and prescription medicines only as told by your health care provider. If you were prescribed an antibiotic medicine, take it as told by your health care provider. Do not stop taking the antibiotic even if you start to feel better. Eating and drinking  Drink enough fluid to keep your urine pale yellow. Avoid caffeinated beverages, tea, and alcohol. These beverages can irritate the bladder and make dysuria worse. In males, alcohol may irritate the prostate. General instructions Watch your condition for any changes. Urinate often. Avoid holding urine for long periods of time. If you are female, you should wipe from front to back after urinating or having a bowel movement. Use each piece of toilet paper only once. Empty your bladder after sex. Keep all follow-up visits. This is important. If you had any tests done to find the cause of dysuria, it is up to you to get your test results. Ask your health care provider, or the department that is doing the test, when your results will be ready. Contact a health care provider if: You have a fever. You develop pain in your back or  sides. You have nausea or vomiting. You have blood in your urine. You are not urinating as often as you usually do. Get help right away if: Your pain is severe and not relieved with medicines. You cannot eat or drink without vomiting. You are confused. You have a rapid heartbeat while resting. You have shaking or chills. You feel extremely weak. Summary Dysuria is pain or discomfort while urinating. Many different conditions can lead to dysuria. If you have dysuria, you may have to urinate frequently or have the sudden feeling that you have to urinate (urgency). Watch your condition for any changes. Keep all follow-up visits. Make sure that you urinate often and drink enough fluid to keep your urine pale yellow. This information is not intended to replace advice given to you by your health care provider. Make sure you discuss any questions you have with your health care provider. Document Revised: 10/04/2019 Document Reviewed: 10/04/2019 Elsevier Patient Education  [employer]. Sinusitis, Adult Sinusitis is inflammation of your sinuses. Sinuses are hollow spaces in the bones around your face. Your sinuses are located: Around your eyes. In the middle of your forehead. Behind your nose. In your cheekbones. Mucus normally drains out of your sinuses. When your nasal tissues become inflamed or swollen, mucus can become trapped or blocked. This allows bacteria, viruses, and fungi to grow, which leads to infection. Most infections of the sinuses are caused by a virus. Sinusitis can develop quickly. It can last for up to 4 weeks (acute) or for more than 12 weeks (chronic). Sinusitis often develops after a cold. What are the  causes? This condition is caused by anything that creates swelling in the sinuses or stops mucus from draining. This includes: Allergies. Asthma. Infection from bacteria or viruses. Deformities or blockages in your nose or sinuses. Abnormal growths in the nose  (nasal polyps). Pollutants, such as chemicals or irritants in the air. Infection from fungi (rare). What increases the risk? You are more likely to develop this condition if you: Have a weak body defense system (immune system). Do a lot of swimming or diving. Overuse nasal sprays. Smoke. What are the signs or symptoms? The main symptoms of this condition are pain and a feeling of pressure around the affected sinuses. Other symptoms include: Stuffy nose or congestion. Thick drainage from your nose. Swelling and warmth over the affected sinuses. Headache. Upper toothache. A cough that may get worse at night. Extra mucus that collects in the throat or the back of the nose (postnasal drip). Decreased sense of smell and taste. Fatigue. A fever. Sore throat. Bad breath. How is this diagnosed? This condition is diagnosed based on: Your symptoms. Your medical history. A physical exam. Tests to find out if your condition is acute or chronic. This may include: Checking your nose for nasal polyps. Viewing your sinuses using a device that has a light (endoscope). Testing for allergies or bacteria. Imaging tests, such as an MRI or CT scan. In rare cases, a bone biopsy may be done to rule out more serious types of fungal sinus disease. How is this treated? Treatment for sinusitis depends on the cause and whether your condition is chronic or acute. If caused by a virus, your symptoms should go away on their own within 10 days. You may be given medicines to relieve symptoms. They include: Medicines that shrink swollen nasal passages (topical intranasal decongestants). Medicines that treat allergies (antihistamines). A spray that eases inflammation of the nostrils (topical intranasal corticosteroids). Rinses that help get rid of thick mucus in your nose (nasal saline washes). If caused by bacteria, your health care provider may recommend waiting to see if your symptoms improve. Most bacterial  infections will get better without antibiotic medicine. You may be given antibiotics if you have: A severe infection. A weak immune system. If caused by narrow nasal passages or nasal polyps, you may need to have surgery. Follow these instructions at home: Medicines Take, use, or apply over-the-counter and prescription medicines only as told by your health care provider. These may include nasal sprays. If you were prescribed an antibiotic medicine, take it as told by your health care provider. Do not stop taking the antibiotic even if you start to feel better. Hydrate and humidify  Drink enough fluid to keep your urine pale yellow. Staying hydrated will help to thin your mucus. Use a cool mist humidifier to keep the humidity level in your home above 50%. Inhale steam for 10-15 minutes, 3-4 times a day, or as told by your health care provider. You can do this in the bathroom while a hot shower is running. Limit your exposure to cool or dry air. Rest Rest as much as possible. Sleep with your head raised (elevated). Make sure you get enough sleep each night. General instructions  Apply a warm, moist washcloth to your face 3-4 times a day or as told by your health care provider. This will help with discomfort. Wash your hands often with soap and water to reduce your exposure to germs. If soap and water are not available, use hand sanitizer. Do not smoke. Avoid being  around people who are smoking (secondhand smoke). Keep all follow-up visits as told by your health care provider. This is important. Contact a health care provider if: You have a fever. Your symptoms get worse. Your symptoms do not improve within 10 days. Get help right away if: You have a severe headache. You have persistent vomiting. You have severe pain or swelling around your face or eyes. You have vision problems. You develop confusion. Your neck is stiff. You have trouble breathing. Summary Sinusitis is soreness and  inflammation of your sinuses. Sinuses are hollow spaces in the bones around your face. This condition is caused by nasal tissues that become inflamed or swollen. The swelling traps or blocks the flow of mucus. This allows bacteria, viruses, and fungi to grow, which leads to infection. If you were prescribed an antibiotic medicine, take it as told by your health care provider. Do not stop taking the antibiotic even if you start to feel better. Keep all follow-up visits as told by your health care provider. This is important. This information is not intended to replace advice given to you by your health care provider. Make sure you discuss any questions you have with your health care provider. Document Revised: 07/24/2017 Document Reviewed: 07/24/2017 Elsevier Patient Education  2022 [employer].

## 2021-01-13 NOTE — Progress Notes (Signed)
Acute Office Visit  Subjective:    Patient ID: Leslie Robbins, female    DOB: [birth year], 65 y.o.   MRN: [id number]  Chief Complaint  Patient presents with   Dysuria    Sinusitis This is a recurrent problem. The current episode started in the past 7 days. The problem has been gradually worsening since onset. There has been no fever. The pain is moderate. Associated symptoms include chills, congestion, coughing and sinus pressure. Pertinent negatives include no sore throat. Past treatments include nothing.  Dysuria  This is a new problem. Episode onset: in the past 3-4 days. The problem occurs intermittently. The problem has been gradually worsening. The quality of the pain is described as burning. The pain is mild. There has been no fever. Associated symptoms include chills, flank pain and nausea. Pertinent negatives include no urgency. She has tried nothing for the symptoms.    Past Medical History:  Diagnosis Date   Anxiety    Arthritis    Depression    GERD (gastroesophageal reflux disease)    History of hiatal hernia    Hypercholesteremia    Hypertension    Hypothyroidism     Past Surgical History:  Procedure Laterality Date   ABDOMINAL HYSTERECTOMY     BREAST LUMPECTOMY     benign   CARDIAC CATHETERIZATION     9 YRS AGO   COLONOSCOPY N/A 05/30/2012   Procedure: COLONOSCOPY;  Surgeon: Rogene Houston, MD;  Location: [facility];  Service: Endoscopy;  Laterality: N/A;  830-moved to 1125 Ann to notify pt   DILATION AND CURETTAGE OF UTERUS     X2   JOINT REPLACEMENT Right    right hip   Right Carpal Tunnel Release     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   Procedure: LEFT TOTAL KNEE ARTHROPLASTY;  Surgeon: Gearlean Alf, MD;  Location: [facility];  Service: Orthopedics;  Laterality: Left;   TOTAL SHOULDER ARTHROPLASTY Right 06/17/2016   TOTAL SHOULDER ARTHROPLASTY Right 06/17/2016   Procedure: RIGHT TOTAL SHOULDER ARTHROPLASTY;  Surgeon: Netta Cedars, MD;  Location: [facility];  Service: Orthopedics;  Laterality: Right;   TUBAL LIGATION      Family History  Problem Relation Age of Onset   Dementia Mother    Hypertension Mother    Lupus Mother    Fibromyalgia Sister    Depression Sister    Bipolar disorder Daughter    Drug abuse Daughter    Breast cancer Maternal Aunt    Breast cancer Cousin    Breast cancer Cousin    Colon cancer Neg Hx     Social History   Socioeconomic History   Marital status: Married    Spouse name: Legrand Como   Number of children: Not on file   Years of education: Not on file   Highest education level: Not on file  Occupational History   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  Tobacco Use   Smoking status: Never   Smokeless tobacco: Never  Vaping Use   Vaping Use: Never used  Substance and Sexual Activity   Alcohol use: No   Drug use: No   Sexual activity: Yes    Birth control/protection: Surgical    Comment: hyst  Other Topics Concern   Not on file  Social History Narrative   2 grandchildren live with her and her husband full time - she has been their full time caregiver since 2019   Social Determinants of Health   Financial  Resource Strain: Low Risk    Difficulty of Paying Living Expenses: Not hard at all  Food Insecurity: No Food Insecurity   Worried About Charity fundraiser in the Last Year: Never true   Ran Out of Food in the Last Year: Never true  Transportation Needs: No Transportation Needs   Lack of Transportation (Medical): No   Lack of Transportation (Non-Medical): No  Physical Activity: Inactive   Days of Exercise per Week: 0 days   Minutes of Exercise per Session: 0 min  Stress: Stress Concern Present   Feeling of Stress : To some extent  Social Connections: Engineer, building services of Communication with Friends and Family: More than three times a week   Frequency of Social Gatherings with Friends and Family: More than three times a week   Attends Religious Services: More than 4  times per year   Active Member of [employer] or Organizations: Yes   Attends Music therapist: More than 4 times per year   Marital Status: Married  Human resources officer Violence: Not At Risk   Fear of Current or Ex-Partner: No   Emotionally Abused: No   Physically Abused: No   Sexually Abused: No    Outpatient Medications Prior to Visit  Medication Sig Dispense Refill   amLODipine (NORVASC) 5 MG tablet Take 1 tablet (5 mg total) by mouth daily. 90 tablet 1   aspirin EC 81 MG tablet Take 81 mg by mouth daily. WILL STOP PRIOR TO PROCEDURE     Calcium Carb-Cholecalciferol 600-200 MG-UNIT TABS Take by mouth.     cetirizine (ZYRTEC) 10 MG tablet Take 10 mg by mouth daily as needed for allergies.     CLINPRO 5000 1.1 % PSTE Place onto teeth at bedtime.     clorazepate (TRANXENE) 3.75 MG tablet Take 1 tablet (3.75 mg total) by mouth at bedtime. 30 tablet 5   DULoxetine (CYMBALTA) 60 MG capsule Take 1 capsule (60 mg total) by mouth 2 (two) times daily. 180 capsule 1   estradiol (ESTRACE) 0.1 MG/GM vaginal cream PLACE 2 GRAMS VAGINALLY AS DIRECTED EVERY OTHER NIGHT 42.5 g 12   fexofenadine-pseudoephedrine (ALLEGRA-D 24) 180-240 MG 24 hr tablet Take 1 tablet by mouth every evening. For allergy and congestion 30 tablet 11   levothyroxine (SYNTHROID) 112 MCG tablet TAKE 1 TABLET BY MOUTH EVERY DAY 90 tablet 2   meloxicam (MOBIC) 15 MG tablet Take 1 tablet (15 mg total) by mouth daily. 90 tablet 1   omeprazole (PRILOSEC) 40 MG capsule Take 1 capsule (40 mg total) by mouth daily. 90 capsule 1   QUEtiapine (SEROQUEL) 50 MG tablet Take 1 tablet (50 mg total) by mouth at bedtime. 90 tablet 1   rOPINIRole (REQUIP) 1 MG tablet Take 1 tablet (1 mg total) by mouth at bedtime. For leg cramps 30 tablet 5   rosuvastatin (CRESTOR) 20 MG tablet Take 1 tablet (20 mg total) by mouth daily. 90 tablet 1   triamterene-hydrochlorothiazide (MAXZIDE-25) 37.5-25 MG tablet Take 1 tablet by mouth daily. As needed for  swelling 90 tablet 1   Vitamin D, Ergocalciferol, (DRISDOL) 1.25 MG (50000 UNIT) CAPS capsule Take 1 capsule (50,000 Units total) by mouth every 7 (seven) days. 13 capsule 3   amoxicillin (AMOXIL) 500 MG tablet TAKE 4 TABLETS BY MOUTH ONCE AS NEEDED FOR UP TO 1 DOSE (ONE HOUR BEFORE DENTAL PROCEDURE). (Patient not taking: Reported on 01/13/2021) 4 tablet 3   No facility-administered medications prior to visit.  Allergies  Allergen Reactions   Sulfonamide Derivatives Rash    SULFONAMIDES INCREASE SENSITIVITY   TO SUNLIGHT > MORE LIKELY TO SUNBURN SUNBURN-LIKE REDNESS    Review of Systems  Constitutional:  Positive for chills.  HENT:  Positive for congestion and sinus pressure. Negative for sore throat.   Respiratory:  Positive for cough.   Gastrointestinal:  Positive for abdominal pain and nausea.  Genitourinary:  Positive for dysuria and flank pain. Negative for urgency.  Skin:  Negative for rash.  All other systems reviewed and are negative.     Objective:    Physical Exam Vitals and nursing note reviewed.  Constitutional:      Appearance: Normal appearance.  HENT:     Head: Normocephalic.     Right Ear: External ear normal.     Left Ear: External ear normal.     Nose: Nose normal.     Mouth/Throat:     Mouth: Mucous membranes are moist.     Pharynx: Oropharynx is clear.  Eyes:     Conjunctiva/sclera: Conjunctivae normal.  Cardiovascular:     Rate and Rhythm: Normal rate and regular rhythm.     Pulses: Normal pulses.     Heart sounds: Normal heart sounds.  Pulmonary:     Effort: Pulmonary effort is normal.     Breath sounds: Normal breath sounds.  Abdominal:     General: Bowel sounds are normal.     Tenderness: There is right CVA tenderness and left CVA tenderness.  Skin:    General: Skin is warm.     Findings: No rash.  Neurological:     Mental Status: She is alert and oriented to person, place, and time.    BP 132/86   Pulse 77   Temp (!) 97.2 F (36.2  C) (Temporal)   Resp 20   Ht $R'5\' 4"'Kk$  (1.626 m)   Wt 210 lb (95.3 kg)   SpO2 98%   BMI 36.05 kg/m  Wt Readings from Last 3 Encounters:  01/13/21 210 lb (95.3 kg)  07/23/20 201 lb 12.8 oz (91.5 kg)  06/15/20 203 lb (92.1 kg)    Health Maintenance Due  Topic Date Due   Zoster Vaccines- Shingrix (1 of 2) Never done   COVID-19 Vaccine (3 - Booster for Moderna series) 09/11/2019    There are no preventive care reminders to display for this patient.   Lab Results  Component Value Date   TSH 1.020 07/23/2020   Lab Results  Component Value Date   WBC 5.6 07/23/2020   HGB 12.4 07/23/2020   HCT 37.9 07/23/2020   MCV 84 07/23/2020   PLT 205 07/23/2020   Lab Results  Component Value Date   NA 142 07/23/2020   K 4.2 07/23/2020   CO2 22 07/23/2020   GLUCOSE 88 07/23/2020   BUN 24 07/23/2020   CREATININE 1.24 (H) 07/23/2020   BILITOT 0.5 07/23/2020   ALKPHOS 191 (H) 07/23/2020   AST 28 07/23/2020   ALT 27 07/23/2020   PROT 6.7 07/23/2020   ALBUMIN 4.0 07/23/2020   CALCIUM 10.0 07/23/2020   ANIONGAP 12 06/18/2016   EGFR 48 (L) 07/23/2020   Lab Results  Component Value Date   CHOL 111 07/23/2020   Lab Results  Component Value Date   HDL 48 07/23/2020   Lab Results  Component Value Date   LDLCALC 47 07/23/2020   Lab Results  Component Value Date   TRIG 79 07/23/2020   Lab Results  Component  Value Date   CHOLHDL 2.3 07/23/2020   No results found for: HGBA1C     Assessment & Plan:   Problem List Items Addressed This Visit       Respiratory   Sinusitis chronic, frontal    Patient presents with sinus pressure, congestion, chills and cough symptoms present in the past few days.  Patient's symptoms presents as frontal sinusitis  Started patient on Augmentin 875-125 mg tablet by mouth daily for 7 days. Increase hydration, Tylenol/ibuprofen for pain Education provided to patient with printed handouts given. Rx sent to pharmacy.  Follow-up with worsening  unresolved symptoms.      Relevant Medications   amoxicillin-clavulanate (AUGMENTIN) 875-125 MG tablet     Other   Dysuria - Primary    Patient presents with unresolved symptoms of dysuria.  Completed urinalysis results negative for leukocytes and nitrites.  Pending cultures.  Advised patient to increase hydration, follow-up with worsening symptoms in the next few days.      Relevant Orders   Urinalysis, Complete   Urine Culture     Meds ordered this encounter  Medications   amoxicillin-clavulanate (AUGMENTIN) 875-125 MG tablet    Sig: Take 1 tablet by mouth 2 (two) times daily.    Dispense:  14 tablet    Refill:  0    Order Specific Question:   Supervising Provider    Answer:   Claretta Fraise [[id number]]     Ivy Lynn, NP

## 2021-01-13 NOTE — Assessment & Plan Note (Signed)
Patient presents with unresolved symptoms of dysuria.  Completed urinalysis results negative for leukocytes and nitrites.  Pending cultures.  Advised patient to increase hydration, follow-up with worsening symptoms in the next few days.

## 2021-01-14 LAB — URINALYSIS, COMPLETE
Bilirubin, UA: NEGATIVE
Glucose, UA: NEGATIVE
Ketones, UA: NEGATIVE
Leukocytes,UA: NEGATIVE
Nitrite, UA: NEGATIVE
Specific Gravity, UA: >1.03 — ABNORMAL HIGH (ref 1.005–1.030)
Urobilinogen, Ur: 0.2 mg/dL (ref 0.2–1.0)
pH, UA: 5.5 (ref 5.0–7.5)

## 2021-01-14 LAB — MICROSCOPIC EXAMINATION

## 2021-01-16 LAB — URINE CULTURE: Organism ID, Bacteria: NO GROWTH

## 2021-01-18 ENCOUNTER — Other Ambulatory Visit: Payer: Self-pay | Admitting: Family Medicine

## 2021-01-18 ENCOUNTER — Encounter: Payer: Self-pay | Admitting: Family Medicine

## 2021-01-18 ENCOUNTER — Ambulatory Visit (INDEPENDENT_AMBULATORY_CARE_PROVIDER_SITE_OTHER): Payer: Medicare HMO | Admitting: Family Medicine

## 2021-01-18 DIAGNOSIS — Z20828 Contact with and (suspected) exposure to other viral communicable diseases: Secondary | ICD-10-CM | POA: Diagnosis not present

## 2021-01-18 DIAGNOSIS — R6889 Other general symptoms and signs: Secondary | ICD-10-CM | POA: Diagnosis not present

## 2021-01-18 DIAGNOSIS — M1712 Unilateral primary osteoarthritis, left knee: Secondary | ICD-10-CM

## 2021-01-18 MED ORDER — OSELTAMIVIR PHOSPHATE 75 MG PO CAPS: 75.0000 mg | ORAL_CAPSULE | Freq: Two times a day (BID) | ORAL | 0 refills | Status: DC

## 2021-01-18 MED ORDER — BENZONATATE 100 MG PO CAPS: 100.0000 mg | ORAL_CAPSULE | Freq: Three times a day (TID) | ORAL | 0 refills | Status: DC | PRN

## 2021-01-18 NOTE — Progress Notes (Signed)
   Virtual Visit  Note Due to COVID-19 pandemic this visit was conducted virtually. This visit type was conducted due to national recommendations for restrictions regarding the COVID-19 Pandemic (e.g. social distancing, sheltering in place) in an effort to limit this patient's exposure and mitigate transmission in our community. All issues noted in this document were discussed and addressed.  A physical exam was not performed with this format.  I connected with Leslie Robbins on 01/18/21 at [facility] by telephone and verified that I am speaking with the correct person using two identifiers. Leslie Robbins is currently located at home and her grandson is currently with her during the visit. The provider, Gwenlyn Perking, FNP is located in their office at time of visit.  I discussed the limitations, risks, security and privacy concerns of performing an evaluation and management service by telephone and the availability of in person appointments. I also discussed with the patient that there may be a patient responsible charge related to this service. The patient expressed understanding and agreed to proceed.  CC: fever  History and Present Illness:  HPI Leslie Robbins reports a fever x2 days with max temp of 102.2. She also reports congestion, ear pain, cough, body aches, chills, and sweats. She denies nausea, vomiting, diarrhea, chest pain, or shortness of breath. She has been taking nyquil, delsym, and advil. She has been caring for her grandson who was diagnosed with the flu a few days ago.     ROS As per HPI.   Observations/Objective: Alert and oriented x 3. Able to speak in full sentences without difficulty.   Assessment and Plan: Leslie Robbins was seen today for fever.  Diagnoses and all orders for this visit:  Flu-like symptoms Discussed currently our office is out of rapid flu tests. Discussed symptoms are consistent with flu and she has a known exposure. Discussed and order tamiflu. Discussed symptomatic  care and return precautions.  -     oseltamivir (TAMIFLU) 75 MG capsule; Take 1 capsule (75 mg total) by mouth 2 (two) times daily for 5 days. -     benzonatate (TESSALON PERLES) 100 MG capsule; Take 1 capsule (100 mg total) by mouth 3 (three) times daily as needed for cough.  Exposure to the flu -     oseltamivir (TAMIFLU) 75 MG capsule; Take 1 capsule (75 mg total) by mouth 2 (two) times daily for 5 days.    Follow Up Instructions: As needed.     I discussed the assessment and treatment plan with the patient. The patient was provided an opportunity to ask questions and all were answered. The patient agreed with the plan and demonstrated an understanding of the instructions.   The patient was advised to call back or seek an in-person evaluation if the symptoms worsen or if the condition fails to improve as anticipated.  The above assessment and management plan was discussed with the patient. The patient verbalized understanding of and has agreed to the management plan. Patient is aware to call the clinic if symptoms persist or worsen. Patient is aware when to return to the clinic for a follow-up visit. Patient educated on when it is appropriate to go to the emergency department.   Time call ended:  0951  I provided 11 minutes of  non face-to-face time during this encounter.    Gwenlyn Perking, FNP

## 2021-01-19 ENCOUNTER — Telehealth: Payer: Self-pay | Admitting: Family Medicine

## 2021-01-19 NOTE — Telephone Encounter (Signed)
Patient aware of + result

## 2021-01-21 ENCOUNTER — Ambulatory Visit: Payer: Medicare HMO | Admitting: Family Medicine

## 2021-01-25 ENCOUNTER — Telehealth: Payer: Self-pay | Admitting: Family Medicine

## 2021-01-25 DIAGNOSIS — R6889 Other general symptoms and signs: Secondary | ICD-10-CM

## 2021-01-25 MED ORDER — GUAIFENESIN-CODEINE 100-10 MG/5ML PO SYRP: 5.0000 mL | ORAL_SOLUTION | Freq: Three times a day (TID) | ORAL | 0 refills | Status: DC | PRN

## 2021-01-25 NOTE — Telephone Encounter (Signed)
Rx sent in

## 2021-01-25 NOTE — Telephone Encounter (Signed)
Pt called to let Tiffany know that the teslon pearls that she prescribed her for cough is not working. Wants to know if she can send her in a cough syrup with codeine.  Please advise and call patient.

## 2021-01-25 NOTE — Telephone Encounter (Signed)
Patient aware and verbalized understanding. °

## 2021-02-02 ENCOUNTER — Ambulatory Visit (INDEPENDENT_AMBULATORY_CARE_PROVIDER_SITE_OTHER): Payer: Medicare HMO | Admitting: Family Medicine

## 2021-02-02 ENCOUNTER — Encounter: Payer: Self-pay | Admitting: Family Medicine

## 2021-02-02 VITALS — BP 125/82 | HR 81 | Temp 97.6°F | Ht 64.0 in | Wt 207.8 lb

## 2021-02-02 DIAGNOSIS — E039 Hypothyroidism, unspecified: Secondary | ICD-10-CM

## 2021-02-02 DIAGNOSIS — K219 Gastro-esophageal reflux disease without esophagitis: Secondary | ICD-10-CM

## 2021-02-02 DIAGNOSIS — I10 Essential (primary) hypertension: Secondary | ICD-10-CM

## 2021-02-02 DIAGNOSIS — E785 Hyperlipidemia, unspecified: Secondary | ICD-10-CM

## 2021-02-02 DIAGNOSIS — E559 Vitamin D deficiency, unspecified: Secondary | ICD-10-CM

## 2021-02-02 DIAGNOSIS — F418 Other specified anxiety disorders: Secondary | ICD-10-CM

## 2021-02-02 DIAGNOSIS — R69 Illness, unspecified: Secondary | ICD-10-CM | POA: Diagnosis not present

## 2021-02-02 MED ORDER — ALBUTEROL SULFATE HFA 108 (90 BASE) MCG/ACT IN AERS: 2.0000 | INHALATION_SPRAY | Freq: Four times a day (QID) | RESPIRATORY_TRACT | 0 refills | Status: DC | PRN

## 2021-02-02 MED ORDER — AMLODIPINE BESYLATE 5 MG PO TABS: 5.0000 mg | ORAL_TABLET | Freq: Every day | ORAL | 3 refills | Status: DC

## 2021-02-02 MED ORDER — QUETIAPINE FUMARATE 50 MG PO TABS: 50.0000 mg | ORAL_TABLET | Freq: Every day | ORAL | 3 refills | Status: DC

## 2021-02-02 MED ORDER — OMEPRAZOLE 40 MG PO CPDR: 40.0000 mg | DELAYED_RELEASE_CAPSULE | Freq: Every day | ORAL | 3 refills | Status: DC

## 2021-02-02 MED ORDER — TRIAMTERENE-HCTZ 37.5-25 MG PO TABS: 1.0000 | ORAL_TABLET | Freq: Every day | ORAL | 3 refills | Status: DC

## 2021-02-02 MED ORDER — DULOXETINE HCL 60 MG PO CPEP: 60.0000 mg | ORAL_CAPSULE | Freq: Two times a day (BID) | ORAL | 3 refills | Status: DC

## 2021-02-02 MED ORDER — ROPINIROLE HCL 1 MG PO TABS: 1.0000 mg | ORAL_TABLET | Freq: Every day | ORAL | 2 refills | Status: DC

## 2021-02-02 MED ORDER — ROSUVASTATIN CALCIUM 20 MG PO TABS
20.0000 mg | ORAL_TABLET | Freq: Every day | ORAL | 3 refills | Status: DC
Start: 2021-02-02 — End: 2022-03-21

## 2021-02-02 MED ORDER — CLORAZEPATE DIPOTASSIUM 3.75 MG PO TABS: 3.7500 mg | ORAL_TABLET | Freq: Every evening | ORAL | 5 refills | Status: DC | PRN

## 2021-02-02 MED ORDER — HYDROCORTISONE (PERIANAL) 2.5 % EX CREA: 1.0000 "application " | TOPICAL_CREAM | Freq: Two times a day (BID) | CUTANEOUS | 0 refills | Status: DC

## 2021-02-02 NOTE — Progress Notes (Signed)
Subjective:  Patient ID: Leslie Robbins, female    DOB: 1955-08-14  Age: 65 y.o. MRN: [id number]  CC: Medical Management of Chronic Issues   HPI Leslie Robbins presents for  presents for  follow-up of hypertension. Patient has no history of headache chest pain or shortness of breath or recent cough. Patient also denies symptoms of TIA such as focal numbness or weakness. Patient denies side effects from medication. States taking it regularly.  GAD 7 : Generalized Anxiety Score 01/13/2021 07/23/2020 09/26/2019 02/25/2019  Nervous, Anxious, on Edge 1 2 1 1   Control/stop worrying 0 0 0 1  Worry too much - different things 0 1 1 1   Trouble relaxing 0 1 0 0  Restless 0 0 1 0  Easily annoyed or irritable 1 3 1 1   Afraid - awful might happen 0 0 0 0  Total GAD 7 Score 2 7 4 4   Anxiety Difficulty Not difficult at all Not difficult at all - -    Depression screen [facility] 2/9 02/02/2021 01/13/2021 07/23/2020 07/23/2020 06/15/2020  Decreased Interest 1 1 1  0 0  Down, Depressed, Hopeless 0 0 0 0 0  PHQ - 2 Score 1 1 1  0 0  Altered sleeping 0 0 1 - -  Tired, decreased energy 2 2 2  - -  Change in appetite 1 0 2 - -  Feeling bad or failure about yourself  0 0 0 - -  Trouble concentrating 0 0 0 - -  Moving slowly or fidgety/restless 0 0 0 - -  Suicidal thoughts 0 0 0 - -  PHQ-9 Score 4 3 6  - -  Difficult doing work/chores Not difficult at all Not difficult at all Not difficult at all - -  Some recent data might be hidden       follow-up on  thyroid. The patient has a history of hypothyroidism for many years. It has been stable recently. Pt. denies any change in  voice, loss of hair, heat or cold intolerance. Energy level has been adequate to good. Patient denies constipation and diarrhea. No myxedema. Medication is as noted below. Verified that pt is taking it daily on an empty stomach. Well tolerated.   in for follow-up of elevated cholesterol. Doing well without complaints on current medication. Denies  side effects of statin including myalgia and arthralgia and nausea. Currently no chest pain, shortness of breath or other cardiovascular related symptoms noted.  Patient in for follow-up of GERD. Currently asymptomatic taking  PPI daily. There is no chest pain or heartburn. No hematemesis and no melena. No dysphagia or choking. Onset is remote. Progression is stable. Complicating factors, none.    Depression screen [facility] 2/9 02/02/2021 01/13/2021 07/23/2020  Decreased Interest 1 1 1   Down, Depressed, Hopeless 0 0 0  PHQ - 2 Score 1 1 1   Altered sleeping 0 0 1  Tired, decreased energy 2 2 2   Change in appetite 1 0 2  Feeling bad or failure about yourself  0 0 0  Trouble concentrating 0 0 0  Moving slowly or fidgety/restless 0 0 0  Suicidal thoughts 0 0 0  PHQ-9 Score 4 3 6   Difficult doing work/chores Not difficult at all Not difficult at all Not difficult at all  Some recent data might be hidden    History Leslie Robbins has a past medical history of Anxiety, Arthritis, Depression, GERD (gastroesophageal reflux disease), History of hiatal hernia, Hypercholesteremia, Hypertension, and Hypothyroidism.   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   Her family history includes Bipolar disorder in her daughter; Breast cancer in her cousin, cousin, and maternal aunt; Dementia in her mother; Depression in her sister; Drug abuse in her daughter; Fibromyalgia in her sister; Hypertension in her mother; Lupus in her mother.She reports that she has never smoked. She has never used smokeless tobacco. She reports that she does not drink alcohol and does not use  drugs.    ROS Review of Systems  Constitutional: Negative.   HENT:  Negative for congestion.   Eyes:  Negative for visual disturbance.  Respiratory:  Positive for cough. Negative for shortness of breath.   Cardiovascular:  Negative for chest pain and palpitations.  Gastrointestinal:  Negative for abdominal pain, constipation, diarrhea, nausea and vomiting.  Genitourinary:  Negative for difficulty urinating.  Musculoskeletal:  Negative for arthralgias and myalgias.  Neurological:  Negative for headaches.  Psychiatric/Behavioral:  Negative for sleep disturbance.    Objective:  BP 125/82   Pulse 81   Temp 97.6 F (36.4 C)   Ht $R'5\' 4"'HY$  (1.626 m)   Wt 207 lb 12.8 oz (94.3 kg)   SpO2 98%   BMI 35.67 kg/m   BP Readings from Last 3 Encounters:  02/02/21 125/82  01/13/21 132/86  07/23/20 117/81    Wt Readings from Last 3 Encounters:  02/02/21 207 lb 12.8 oz (94.3 kg)  01/13/21 210 lb (95.3 kg)  07/23/20 201 lb 12.8 oz (91.5 kg)     Physical Exam Constitutional:      General: She is not in acute distress.    Appearance: She is well-developed.  HENT:     Head: Normocephalic and atraumatic.  Eyes:     Conjunctiva/sclera: Conjunctivae normal.     Pupils: Pupils are equal, round, and reactive to light.  Neck:     Thyroid: No thyromegaly.  Cardiovascular:     Rate and Rhythm: Normal rate and regular rhythm.     Heart sounds: Normal heart sounds. No murmur heard. Pulmonary:     Effort: Pulmonary effort is normal. No respiratory distress.     Breath sounds: Normal breath sounds. No wheezing or rales.  Abdominal:     General: Bowel sounds are normal. There is no distension.     Palpations: Abdomen is soft.     Tenderness: There is no abdominal tenderness.  Musculoskeletal:        General: Normal range of motion.     Cervical back: Normal range of motion and neck supple.  Lymphadenopathy:     Cervical: No cervical adenopathy.  Skin:    General: Skin is warm and dry.   Neurological:     Mental Status: She is alert and oriented to person, place, and time.  Psychiatric:        Behavior: Behavior normal.        Thought Content: Thought content normal.        Judgment: Judgment normal.      Assessment & Plan:   Leslie Robbins was seen today for medical management of chronic issues.  Diagnoses and all orders for this visit:  Essential hypertension -     CBC with Differential/Platelet -     CMP14+EGFR -     amLODipine (NORVASC) 5 MG tablet; Take  1 tablet (5 mg total) by mouth daily. -     triamterene-hydrochlorothiazide (MAXZIDE-25) 37.5-25 MG tablet; Take 1 tablet by mouth daily. As needed for swelling  Hypothyroidism, unspecified type -     TSH + free T4  Hyperlipidemia with target LDL less than 100 -     Lipid panel -     rosuvastatin (CRESTOR) 20 MG tablet; Take 1 tablet (20 mg total) by mouth daily.  Vitamin D deficiency -     VITAMIN D 25 Hydroxy (Vit-D Deficiency, Fractures)  Depression with anxiety -     clorazepate (TRANXENE) 3.75 MG tablet; Take 1 tablet (3.75 mg total) by mouth at bedtime as needed for anxiety. -     DULoxetine (CYMBALTA) 60 MG capsule; Take 1 capsule (60 mg total) by mouth 2 (two) times daily.  Gastroesophageal reflux disease without esophagitis -     omeprazole (PRILOSEC) 40 MG capsule; Take 1 capsule (40 mg total) by mouth daily.  Other orders -     QUEtiapine (SEROQUEL) 50 MG tablet; Take 1 tablet (50 mg total) by mouth at bedtime. -     rOPINIRole (REQUIP) 1 MG tablet; Take 1 tablet (1 mg total) by mouth at bedtime. For leg cramps -     hydrocortisone (PROCTOSOL HC) 2.5 % rectal cream; Place 1 application rectally 2 (two) times daily. -     albuterol (VENTOLIN HFA) 108 (90 Base) MCG/ACT inhaler; Inhale 2 puffs into the lungs every 6 (six) hours as needed for wheezing or shortness of breath.      I have discontinued Shauntell Iglesia. Goering's amoxicillin-clavulanate and benzonatate. I have also changed her clorazepate.  Additionally, I am having her start on hydrocortisone and albuterol. Lastly, I am having her maintain her aspirin EC, cetirizine, Calcium Carb-Cholecalciferol, fexofenadine-pseudoephedrine, estradiol, Clinpro 5000, Vitamin D (Ergocalciferol), levothyroxine, meloxicam, guaiFENesin-codeine, amLODipine, DULoxetine, omeprazole, QUEtiapine, rOPINIRole, rosuvastatin, and triamterene-hydrochlorothiazide.  Allergies as of 02/02/2021       Reactions   Sulfonamide Derivatives Rash   SULFONAMIDES INCREASE SENSITIVITY   TO SUNLIGHT > MORE LIKELY TO SUNBURN SUNBURN-LIKE REDNESS        Medication List        Accurate as of February 02, 2021  1:58 PM. If you have any questions, ask your nurse or doctor.          STOP taking these medications    amoxicillin-clavulanate 875-125 MG tablet Commonly known as: AUGMENTIN Stopped by: Claretta Fraise, MD   benzonatate 100 MG capsule Commonly known as: Best boy Stopped by: Claretta Fraise, MD       TAKE these medications    albuterol 108 (90 Base) MCG/ACT inhaler Commonly known as: VENTOLIN HFA Inhale 2 puffs into the lungs every 6 (six) hours as needed for wheezing or shortness of breath. Started by: Claretta Fraise, MD   amLODipine 5 MG tablet Commonly known as: NORVASC Take 1 tablet (5 mg total) by mouth daily.   aspirin EC 81 MG tablet Take 81 mg by mouth daily. WILL STOP PRIOR TO PROCEDURE   Calcium Carb-Cholecalciferol 600-200 MG-UNIT Tabs Take by mouth.   cetirizine 10 MG tablet Commonly known as: ZYRTEC Take 10 mg by mouth daily as needed for allergies.   Clinpro 5000 1.1 % Pste Generic drug: Sodium Fluoride Place onto teeth at bedtime.   clorazepate 3.75 MG tablet Commonly known as: TRANXENE Take 1 tablet (3.75 mg total) by mouth at bedtime as needed for anxiety. What changed:  when to take this reasons to take  this Changed by: Claretta Fraise, MD   DULoxetine 60 MG capsule Commonly known as: CYMBALTA Take 1  capsule (60 mg total) by mouth 2 (two) times daily.   estradiol 0.1 MG/GM vaginal cream Commonly known as: ESTRACE PLACE 2 GRAMS VAGINALLY AS DIRECTED EVERY OTHER NIGHT   fexofenadine-pseudoephedrine 180-240 MG 24 hr tablet Commonly known as: ALLEGRA-D 24 Take 1 tablet by mouth every evening. For allergy and congestion   guaiFENesin-codeine 100-10 MG/5ML syrup Commonly known as: ROBITUSSIN AC Take 5 mLs by mouth 3 (three) times daily as needed for cough.   hydrocortisone 2.5 % rectal cream Commonly known as: Proctosol HC Place 1 application rectally 2 (two) times daily. Started by: Claretta Fraise, MD   levothyroxine 112 MCG tablet Commonly known as: SYNTHROID TAKE 1 TABLET BY MOUTH EVERY DAY   meloxicam 15 MG tablet Commonly known as: MOBIC TAKE 1 TABLET (15 MG TOTAL) BY MOUTH DAILY.   omeprazole 40 MG capsule Commonly known as: PRILOSEC Take 1 capsule (40 mg total) by mouth daily.   QUEtiapine 50 MG tablet Commonly known as: SEROQUEL Take 1 tablet (50 mg total) by mouth at bedtime.   rOPINIRole 1 MG tablet Commonly known as: REQUIP Take 1 tablet (1 mg total) by mouth at bedtime. For leg cramps   rosuvastatin 20 MG tablet Commonly known as: CRESTOR Take 1 tablet (20 mg total) by mouth daily.   triamterene-hydrochlorothiazide 37.5-25 MG tablet Commonly known as: MAXZIDE-25 Take 1 tablet by mouth daily. As needed for swelling   Vitamin D (Ergocalciferol) 1.25 MG (50000 UNIT) Caps capsule Commonly known as: DRISDOL Take 1 capsule (50,000 Units total) by mouth every 7 (seven) days.         Follow-up: Return in about 6 months (around 08/02/2021).  Claretta Fraise, M.D.

## 2021-02-03 LAB — CBC WITH DIFFERENTIAL/PLATELET
Basophils Absolute: 0.1 10*3/uL (ref 0.0–0.2)
Basos: 1 %
EOS (ABSOLUTE): 0.2 10*3/uL (ref 0.0–0.4)
Eos: 3 %
Hematocrit: 37.8 % (ref 34.0–46.6)
Hemoglobin: 12.4 g/dL (ref 11.1–15.9)
Immature Grans (Abs): 0 10*3/uL (ref 0.0–0.1)
Immature Granulocytes: 0 %
Lymphocytes Absolute: 1.6 10*3/uL (ref 0.7–3.1)
Lymphs: 24 %
MCH: 26.6 pg (ref 26.6–33.0)
MCHC: 32.8 g/dL (ref 31.5–35.7)
MCV: 81 fL (ref 79–97)
Monocytes Absolute: 0.6 10*3/uL (ref 0.1–0.9)
Monocytes: 9 %
Neutrophils Absolute: 4.1 10*3/uL (ref 1.4–7.0)
Neutrophils: 63 %
Platelets: 280 10*3/uL (ref 150–450)
RBC: 4.67 x10E6/uL (ref 3.77–5.28)
RDW: 15.1 % (ref 11.7–15.4)
WBC: 6.4 10*3/uL (ref 3.4–10.8)

## 2021-02-03 LAB — CMP14+EGFR
ALT: 21 IU/L (ref 0–32)
AST: 21 IU/L (ref 0–40)
Albumin/Globulin Ratio: 1.7 (ref 1.2–2.2)
Albumin: 4.4 g/dL (ref 3.8–4.8)
Alkaline Phosphatase: 156 IU/L — ABNORMAL HIGH (ref 44–121)
BUN/Creatinine Ratio: 18 (ref 12–28)
BUN: 24 mg/dL (ref 8–27)
Bilirubin Total: 0.6 mg/dL (ref 0.0–1.2)
CO2: 23 mmol/L (ref 20–29)
Calcium: 10.5 mg/dL — ABNORMAL HIGH (ref 8.7–10.3)
Chloride: 105 mmol/L (ref 96–106)
Creatinine, Ser: 1.35 mg/dL — ABNORMAL HIGH (ref 0.57–1.00)
Globulin, Total: 2.6 g/dL (ref 1.5–4.5)
Glucose: 78 mg/dL (ref 70–99)
Potassium: 4.8 mmol/L (ref 3.5–5.2)
Sodium: 141 mmol/L (ref 134–144)
Total Protein: 7 g/dL (ref 6.0–8.5)
eGFR: 43 mL/min/{1.73_m2} — ABNORMAL LOW (ref >59)

## 2021-02-03 LAB — LIPID PANEL
Chol/HDL Ratio: 2.5 ratio (ref 0.0–4.4)
Cholesterol, Total: 135 mg/dL (ref 100–199)
HDL: 54 mg/dL (ref >39)
LDL Chol Calc (NIH): 60 mg/dL (ref 0–99)
Triglycerides: 117 mg/dL (ref 0–149)
VLDL Cholesterol Cal: 21 mg/dL (ref 5–40)

## 2021-02-03 LAB — VITAMIN D 25 HYDROXY (VIT D DEFICIENCY, FRACTURES): Vit D, 25-Hydroxy: 56.9 ng/mL (ref 30.0–100.0)

## 2021-02-03 LAB — TSH+FREE T4
Free T4: 1.26 ng/dL (ref 0.82–1.77)
TSH: 7.36 u[IU]/mL — ABNORMAL HIGH (ref 0.450–4.500)

## 2021-02-04 ENCOUNTER — Other Ambulatory Visit: Payer: Self-pay | Admitting: *Deleted

## 2021-02-04 ENCOUNTER — Other Ambulatory Visit: Payer: Self-pay | Admitting: Family Medicine

## 2021-02-04 DIAGNOSIS — E039 Hypothyroidism, unspecified: Secondary | ICD-10-CM

## 2021-02-04 MED ORDER — LEVOTHYROXINE SODIUM 125 MCG PO TABS: 125.0000 ug | ORAL_TABLET | Freq: Every day | ORAL | 1 refills | Status: DC

## 2021-03-26 ENCOUNTER — Other Ambulatory Visit: Payer: Medicare HMO

## 2021-03-26 DIAGNOSIS — E039 Hypothyroidism, unspecified: Secondary | ICD-10-CM

## 2021-03-27 LAB — TSH+FREE T4
Free T4: 1.76 ng/dL (ref 0.82–1.77)
TSH: 0.024 u[IU]/mL — ABNORMAL LOW (ref 0.450–4.500)

## 2021-03-29 ENCOUNTER — Encounter: Payer: Self-pay | Admitting: Family Medicine

## 2021-03-30 ENCOUNTER — Ambulatory Visit (INDEPENDENT_AMBULATORY_CARE_PROVIDER_SITE_OTHER): Payer: Medicare HMO | Admitting: Family Medicine

## 2021-03-30 ENCOUNTER — Encounter: Payer: Self-pay | Admitting: Family Medicine

## 2021-03-30 VITALS — Ht 64.0 in

## 2021-03-30 DIAGNOSIS — Z23 Encounter for immunization: Secondary | ICD-10-CM

## 2021-03-30 DIAGNOSIS — K5904 Chronic idiopathic constipation: Secondary | ICD-10-CM

## 2021-03-30 DIAGNOSIS — F411 Generalized anxiety disorder: Secondary | ICD-10-CM

## 2021-03-30 DIAGNOSIS — E039 Hypothyroidism, unspecified: Secondary | ICD-10-CM

## 2021-03-30 DIAGNOSIS — R69 Illness, unspecified: Secondary | ICD-10-CM | POA: Diagnosis not present

## 2021-03-30 MED ORDER — LINACLOTIDE 72 MCG PO CAPS: 72.0000 ug | ORAL_CAPSULE | Freq: Every day | ORAL | 5 refills | Status: DC

## 2021-03-30 NOTE — Progress Notes (Signed)
Subjective:  Patient ID: Leslie Robbins, female    DOB: 03-18-1955  Age: 66 y.o. MRN: [id number]  CC: Hypothyroidism   HPI Leslie Robbins presents for recheck of her thyroid condition.  She says she feels 75% better since starting on the higher dose of thyroxine.  She notes that her free T4 is a bit toward the upper end and the TSH is low.  She virtually begs me not to decrease her medicine because she felt so bad while taking the lower dose.  She has been up as high as 1.5 in the past with her dose.  Currently she is also dealing with her daughter and 3 grandchildren living with her.  The 3 grandchildren will not listen to her or do their chores.  Leslie Robbins daughter says that she works so she does not need to be doing chores.  As a result her example is followed by the children.  Leslie Robbins does not think that she will be comfortable until they have moved out.  She feels stressed by having to feed and care for the extra people on her budget.  Her depression score is up today see below.  However, she cannot tolerate Celexa Seroquel and she is on the highest dose of the duloxetine.  She agrees that symptoms do not seem severe enough to warrant a complete change of regimen at this time.  Depression screen [facility] 2/9 03/30/2021 03/30/2021 02/02/2021  Decreased Interest 2 0 1  Down, Depressed, Hopeless 2 0 0  PHQ - 2 Score 4 0 1  Altered sleeping 1 - 0  Tired, decreased energy 1 - 2  Change in appetite 1 - 1  Feeling bad or failure about yourself  0 - 0  Trouble concentrating 0 - 0  Moving slowly or fidgety/restless 1 - 0  Suicidal thoughts 0 - 0  PHQ-9 Score 8 - 4  Difficult doing work/chores Somewhat difficult - Not difficult at all  Some recent data might be hidden    History Leslie Robbins has a past medical history of Anxiety, Arthritis, Depression, GERD (gastroesophageal reflux disease), History of hiatal hernia, Hypercholesteremia, Hypertension, and Hypothyroidism.   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   Her family history includes Bipolar disorder in her daughter; Breast cancer in her cousin, cousin, and maternal aunt; Dementia in her mother; Depression in her sister; Drug abuse in her daughter; Fibromyalgia in her sister; Hypertension in her mother; Lupus in her mother.She reports that she has never smoked. She has never used smokeless tobacco. She reports that she does not drink alcohol and does not use drugs.    ROS Review of Systems  Constitutional: Negative.   HENT: Negative.    Eyes:  Negative for visual disturbance.  Respiratory:  Negative for shortness of breath.   Cardiovascular:  Negative for chest pain.  Gastrointestinal:  Positive for constipation (Very frequent.  However when she uses MiraLAX, she starts having diarrhea.). Negative for abdominal pain.  Musculoskeletal:  Negative for arthralgias.  Psychiatric/Behavioral:  The patient is nervous/anxious.    Objective:  Ht 5\' 4"  (1.626 m)    BMI 35.67 kg/m   BP Readings from Last 3 Encounters:  02/02/21 125/82  01/13/21 132/86  07/23/20 117/81  Wt Readings from Last 3 Encounters:  02/02/21 207 lb 12.8 oz (94.3 kg)  01/13/21 210 lb (95.3 kg)  07/23/20 201 lb 12.8 oz (91.5 kg)     Physical Exam Constitutional:      General: She is not in acute distress.    Appearance: She is well-developed.  Cardiovascular:     Rate and Rhythm: Normal rate and regular rhythm.  Pulmonary:     Breath sounds: Normal breath sounds.  Musculoskeletal:        General: Normal range of motion.  Skin:    General: Skin is warm and dry.  Neurological:     Mental Status: She is  alert and oriented to person, place, and time.    Results for orders placed or performed in visit on 03/26/21  TSH + free T4  Result Value Ref Range   TSH 0.024 (L) 0.450 - 4.500 uIU/mL   Free T4 1.76 0.82 - 1.77 ng/dL     Assessment & Plan:   Leslie Robbins was seen today for hypothyroidism.  Diagnoses and all orders for this visit:  Hypothyroidism, unspecified type  GAD (generalized anxiety disorder)  Chronic idiopathic constipation -     linaclotide (LINZESS) 72 MCG capsule; Take 1 capsule (72 mcg total) by mouth daily before breakfast.       I have discontinued Amelda W. Ashworth's Calcium Carb-Cholecalciferol, fexofenadine-pseudoephedrine, guaiFENesin-codeine, QUEtiapine, rOPINIRole, and hydrocortisone. I am also having her start on linaclotide. Additionally, I am having her maintain her aspirin EC, cetirizine, estradiol, Clinpro 5000, Vitamin D (Ergocalciferol), meloxicam, amLODipine, clorazepate, DULoxetine, omeprazole, rosuvastatin, triamterene-hydrochlorothiazide, albuterol, and levothyroxine.  Allergies as of 03/30/2021       Reactions   Seroquel [quetiapine] Other (See Comments)   Seroquel anger, hostility, increased anxiety   Sulfonamide Derivatives Rash   SULFONAMIDES INCREASE SENSITIVITY   TO SUNLIGHT > MORE LIKELY TO SUNBURN SUNBURN-LIKE REDNESS        Medication List        Accurate as of March 30, 2021 10:42 AM. If you have any questions, ask your nurse or doctor.          STOP taking these medications    Calcium Carb-Cholecalciferol 600-200 MG-UNIT Tabs Stopped by: Claretta Fraise, MD   fexofenadine-pseudoephedrine 180-240 MG 24 hr tablet Commonly known as: ALLEGRA-D 24 Stopped by: Claretta Fraise, MD   guaiFENesin-codeine 100-10 MG/5ML syrup Commonly known as: ROBITUSSIN AC Stopped by: Claretta Fraise, MD   hydrocortisone 2.5 % rectal cream Commonly known as: Proctosol HC Stopped by: Claretta Fraise, MD   QUEtiapine 50 MG tablet Commonly known as:  SEROQUEL Stopped by: Claretta Fraise, MD   rOPINIRole 1 MG tablet Commonly known as: REQUIP Stopped by: Claretta Fraise, MD       TAKE these medications    albuterol 108 (90 Base) MCG/ACT inhaler Commonly known as: VENTOLIN HFA Inhale 2 puffs into the lungs every 6 (six) hours as needed for wheezing or shortness of breath.   amLODipine 5 MG tablet Commonly known as: NORVASC Take 1 tablet (5 mg total) by mouth daily.   aspirin EC 81 MG tablet Take 81 mg by mouth daily. WILL STOP PRIOR TO PROCEDURE   cetirizine 10 MG tablet Commonly known as: ZYRTEC Take 10 mg by mouth daily as needed for allergies.   Clinpro 5000 1.1 % Pste Generic drug: Sodium Fluoride Place onto teeth at bedtime.   clorazepate 3.75 MG tablet Commonly known as: TRANXENE Take 1 tablet (3.75 mg total) by mouth at bedtime as needed  for anxiety.   DULoxetine 60 MG capsule Commonly known as: CYMBALTA Take 1 capsule (60 mg total) by mouth 2 (two) times daily.   estradiol 0.1 MG/GM vaginal cream Commonly known as: ESTRACE PLACE 2 GRAMS VAGINALLY AS DIRECTED EVERY OTHER NIGHT   levothyroxine 125 MCG tablet Commonly known as: SYNTHROID Take 1 tablet (125 mcg total) by mouth daily.   linaclotide 72 MCG capsule Commonly known as: Linzess Take 1 capsule (72 mcg total) by mouth daily before breakfast. Started by: Claretta Fraise, MD   meloxicam 15 MG tablet Commonly known as: MOBIC TAKE 1 TABLET (15 MG TOTAL) BY MOUTH DAILY.   omeprazole 40 MG capsule Commonly known as: PRILOSEC Take 1 capsule (40 mg total) by mouth daily.   rosuvastatin 20 MG tablet Commonly known as: CRESTOR Take 1 tablet (20 mg total) by mouth daily.   triamterene-hydrochlorothiazide 37.5-25 MG tablet Commonly known as: MAXZIDE-25 Take 1 tablet by mouth daily. As needed for swelling   Vitamin D (Ergocalciferol) 1.25 MG (50000 UNIT) Caps capsule Commonly known as: DRISDOL Take 1 capsule (50,000 Units total) by mouth every 7  (seven) days.         Follow-up: No follow-ups on file.  Claretta Fraise, M.D.

## 2021-03-30 NOTE — Addendum Note (Signed)
Addended by: Milas Hock on: 03/30/2021 11:14 AM   Modules accepted: Orders

## 2021-06-14 ENCOUNTER — Other Ambulatory Visit: Payer: Self-pay | Admitting: Obstetrics & Gynecology

## 2021-06-16 NOTE — Patient Instructions (Signed)
Leslie Robbins , ?Thank you for taking time to come for your Medicare Wellness Visit. I appreciate your ongoing commitment to your health goals. Please review the following plan we discussed and let me know if I can assist you in the future.  ? ?Screening recommendations/referrals: ?Colonoscopy: Done 05/30/2012 - Repeat in 10 years  *next year with Dr Laural Golden ?Mammogram: Done 09/30/2020 - Repeat annually ?Bone Density: Done 09/30/2020 - Repeat every 2 years  ?Recommended yearly ophthalmology/optometry visit for glaucoma screening and checkup ?Recommended yearly dental visit for hygiene and checkup ? ?Vaccinations: ?Influenza vaccine: Done 03/30/2021 - repeat every fall ?Pneumococcal vaccine: Done 02/13/2019 & 07/23/2020 ?Tdap vaccine: Done 01/01/2012 - Repeat in 10 years ?Shingles vaccine: Done 03/30/2021 - due for second dose at your next visit* ?Covid-19: Done 06/19/2019 & 07/17/2019 - for boosters, contact pharmacy ? ?Advanced directives: Advance directive discussed with you today. Even though you declined this today, please call our office should you change your mind, and we can give you the proper paperwork for you to fill out.  ? ?Conditions/risks identified: Keep up the great work losing weight and eating better! Aim for 30 minutes of exercise or brisk walking, 6-8 glasses of water, and 5 servings of fruits and vegetables each day.  ? ?Next appointment: Follow up in one year for your annual wellness visit  ? ? ?Preventive Care 70 Years and Older, Female ?Preventive care refers to lifestyle choices and visits with your health care provider that can promote health and wellness. ?What does prevent/ive care include? ?A yearly physical exam. This is also called an annual well check. ?Dental exams once or twice a year. ?Routine eye exams. Ask your health care provider how often you should have your eyes checked. ?Personal lifestyle choices, including: ?Daily care of your teeth and gums. ?Regular physical activity. ?Eating a  healthy diet. ?Avoiding tobacco and drug use. ?Limiting alcohol use. ?Practicing safe sex. ?Taking low-dose aspirin every day. ?Taking vitamin and mineral supplements as recommended by your health care provider. ?What happens during an annual well check? ?The services and screenings done by your health care provider during your annual well check will depend on your age, overall health, lifestyle risk factors, and family history of disease. ?Counseling  ?Your health care provider may ask you questions about your: ?Alcohol use. ?Tobacco use. ?Drug use. ?Emotional well-being. ?Home and relationship well-being. ?Sexual activity. ?Eating habits. ?History of falls. ?Memory and ability to understand (cognition). ?Work and work Statistician. ?Reproductive health. ?Screening  ?You may have the following tests or measurements: ?Height, weight, and BMI. ?Blood pressure. ?Lipid and cholesterol levels. These may be checked every 5 years, or more frequently if you are over 67 years old. ?Skin check. ?Lung cancer screening. You may have this screening every year starting at age 67 if you have a 30-pack-year history of smoking and currently smoke or have quit within the past 15 years. ?Fecal occult blood test (FOBT) of the stool. You may have this test every year starting at age 56. ?Flexible sigmoidoscopy or colonoscopy. You may have a sigmoidoscopy every 5 years or a colonoscopy every 10 years starting at age 33. ?Hepatitis C blood test. ?Hepatitis B blood test. ?Sexually transmitted disease (STD) testing. ?Diabetes screening. This is done by checking your blood sugar (glucose) after you have not eaten for a while (fasting). You may have this done every 1-3 years. ?Bone density scan. This is done to screen for osteoporosis. You may have this done starting at age 57. ?Mammogram. This  may be done every 1-2 years. Talk to your health care provider about how often you should have regular mammograms. ?Talk with your health care  provider about your test results, treatment options, and if necessary, the need for more tests. ?Vaccines  ?Your health care provider may recommend certain vaccines, such as: ?Influenza vaccine. This is recommended every year. ?Tetanus, diphtheria, and acellular pertussis (Tdap, Td) vaccine. You may need a Td booster every 10 years. ?Zoster vaccine. You may need this after age 28. ?Pneumococcal 13-valent conjugate (PCV13) vaccine. One dose is recommended after age 2. ?Pneumococcal polysaccharide (PPSV23) vaccine. One dose is recommended after age 59. ?Talk to your health care provider about which screenings and vaccines you need and how often you need them. ?This information is not intended to replace advice given to you by your health care provider. Make sure you discuss any questions you have with your health care provider. ?Document Released: 03/20/2015 Document Revised: 11/11/2015 Document Reviewed: 12/23/2014 ?Elsevier Interactive Patient Education ? 2017 [employer]. ? ?Fall Prevention in the Home ?Falls can cause injuries. They can happen to people of all ages. There are many things you can do to make your home safe and to help prevent falls. ?What can I do on the outside of my home? ?Regularly fix the edges of walkways and driveways and fix any cracks. ?Remove anything that might make you trip as you walk through a door, such as a raised step or threshold. ?Trim any bushes or trees on the path to your home. ?Use bright outdoor lighting. ?Clear any walking paths of anything that might make someone trip, such as rocks or tools. ?Regularly check to see if handrails are loose or broken. Make sure that both sides of any steps have handrails. ?Any raised decks and porches should have guardrails on the edges. ?Have any leaves, snow, or ice cleared regularly. ?Use sand or salt on walking paths during winter. ?Clean up any spills in your garage right away. This includes oil or grease spills. ?What can I do in the  bathroom? ?Use night lights. ?Install grab bars by the toilet and in the tub and shower. Do not use towel bars as grab bars. ?Use non-skid mats or decals in the tub or shower. ?If you need to sit down in the shower, use a plastic, non-slip stool. ?Keep the floor dry. Clean up any water that spills on the floor as soon as it happens. ?Remove soap buildup in the tub or shower regularly. ?Attach bath mats securely with double-sided non-slip rug tape. ?Do not have throw rugs and other things on the floor that can make you trip. ?What can I do in the bedroom? ?Use night lights. ?Make sure that you have a light by your bed that is easy to reach. ?Do not use any sheets or blankets that are too big for your bed. They should not hang down onto the floor. ?Have a firm chair that has side arms. You can use this for support while you get dressed. ?Do not have throw rugs and other things on the floor that can make you trip. ?What can I do in the kitchen? ?Clean up any spills right away. ?Avoid walking on wet floors. ?Keep items that you use a lot in easy-to-reach places. ?If you need to reach something above you, use a strong step stool that has a grab bar. ?Keep electrical cords out of the way. ?Do not use floor polish or wax that makes floors slippery. If you must  use wax, use non-skid floor wax. ?Do not have throw rugs and other things on the floor that can make you trip. ?What can I do with my stairs? ?Do not leave any items on the stairs. ?Make sure that there are handrails on both sides of the stairs and use them. Fix handrails that are broken or loose. Make sure that handrails are as long as the stairways. ?Check any carpeting to make sure that it is firmly attached to the stairs. Fix any carpet that is loose or worn. ?Avoid having throw rugs at the top or bottom of the stairs. If you do have throw rugs, attach them to the floor with carpet tape. ?Make sure that you have a light switch at the top of the stairs and the  bottom of the stairs. If you do not have them, ask someone to add them for you. ?What else can I do to help prevent falls? ?Wear shoes that: ?Do not have high heels. ?Have rubber bottoms. ?Are comfortable and fit you

## 2021-06-17 ENCOUNTER — Ambulatory Visit (INDEPENDENT_AMBULATORY_CARE_PROVIDER_SITE_OTHER): Payer: Medicare HMO

## 2021-06-17 VITALS — Wt 197.0 lb

## 2021-06-17 DIAGNOSIS — Z0000 Encounter for general adult medical examination without abnormal findings: Secondary | ICD-10-CM | POA: Diagnosis not present

## 2021-06-17 NOTE — Progress Notes (Signed)
? ?Subjective:  ? Leslie Robbins is a 66 y.o. female who presents for Medicare Annual (Subsequent) preventive examination. ? ?Virtual Visit via Telephone Note ? ?I connected with  Leslie Robbins on 06/17/21 at  8:15 AM EDT by telephone and verified that I am speaking with the correct person using two identifiers. ? ?Location: ?Patient: Home ?Provider: [facility] ?Persons participating in the virtual visit: patient/Nurse Health Advisor ?  ?I discussed the limitations, risks, security and privacy concerns of performing an evaluation and management service by telephone and the availability of in person appointments. The patient expressed understanding and agreed to proceed. ? ?Interactive audio and video telecommunications were attempted between this nurse and patient, however failed, due to patient having technical difficulties OR patient did not have access to video capability.  We continued and completed visit with audio only. ? ?Some vital signs may be absent or patient reported.  ? ?Jefry Lesinski Dionne Ano, LPN  ? ?Review of Systems    ? ?Cardiac Risk Factors include: advanced age (>53mn, >>21women);sedentary lifestyle;obesity (BMI >30kg/m2);hypertension;dyslipidemia ? ?   ?Objective:  ?  ?Today's Vitals  ? 06/17/21 0814  ?Weight: 197 lb (89.4 kg)  ?PainSc: 3   ? ?Body mass index is 33.81 kg/m?. ? ? ?  06/17/2021  ?  8:21 AM 08/10/2020  ?  9:58 AM 06/15/2020  ?  4:17 PM 07/14/2016  ?  2:02 PM 07/05/2016  ?  9:49 AM 06/17/2016  ? 11:02 AM 06/13/2016  ?  9:54 AM  ?Advanced Directives  ?Does Patient Have a Medical Advance Directive? No No No No No No No  ?Would patient like information on creating a medical advance directive? No - Patient declined  Yes (MAU/Ambulatory/Procedural Areas - Information given) No - Patient declined No - Patient declined No - Patient declined No - Patient declined  ? ? ?Current Medications (verified) ?Outpatient Encounter Medications as of 06/17/2021  ?Medication Sig  ? albuterol (VENTOLIN HFA) 108 (90 Base) MCG/ACT  inhaler Inhale 2 puffs into the lungs every 6 (six) hours as needed for wheezing or shortness of breath.  ? amLODipine (NORVASC) 5 MG tablet Take 1 tablet (5 mg total) by mouth daily.  ? aspirin EC 81 MG tablet Take 81 mg by mouth daily. WILL STOP PRIOR TO PROCEDURE  ? cetirizine (ZYRTEC) 10 MG tablet Take 10 mg by mouth daily as needed for allergies.  ? CLINPRO 5000 1.1 % PSTE Place onto teeth at bedtime.  ? clorazepate (TRANXENE) 3.75 MG tablet Take 1 tablet (3.75 mg total) by mouth at bedtime as needed for anxiety.  ? DULoxetine (CYMBALTA) 60 MG capsule Take 1 capsule (60 mg total) by mouth 2 (two) times daily.  ? estradiol (ESTRACE) 0.1 MG/GM vaginal cream PLACE 2 GRAMS VAGINALLY AS DIRECTED EVERY OTHER NIGHT  ? levothyroxine (SYNTHROID) 125 MCG tablet Take 1 tablet (125 mcg total) by mouth daily.  ? meloxicam (MOBIC) 15 MG tablet TAKE 1 TABLET (15 MG TOTAL) BY MOUTH DAILY.  ? omeprazole (PRILOSEC) 40 MG capsule Take 1 capsule (40 mg total) by mouth daily.  ? rosuvastatin (CRESTOR) 20 MG tablet Take 1 tablet (20 mg total) by mouth daily.  ? triamterene-hydrochlorothiazide (MAXZIDE-25) 37.5-25 MG tablet Take 1 tablet by mouth daily. As needed for swelling  ? Vitamin D, Ergocalciferol, (DRISDOL) 1.25 MG (50000 UNIT) CAPS capsule Take 1 capsule (50,000 Units total) by mouth every 7 (seven) days.  ? [DISCONTINUED] linaclotide (LINZESS) 72 MCG capsule Take 1 capsule (72 mcg total) by mouth  daily before breakfast. (Patient not taking: Reported on 06/17/2021)  ? ?No facility-administered encounter medications on file as of 06/17/2021.  ? ? ?Allergies (verified) ?Seroquel [quetiapine] and Sulfonamide derivatives  ? ?History: ?Past Medical History:  ?Diagnosis Date  ? Anxiety   ? Arthritis   ? Depression   ? GERD (gastroesophageal reflux disease)   ? History of hiatal hernia   ? Hypercholesteremia   ? Hypertension   ? Hypothyroidism   ? ?Past Surgical History:  ?Procedure Laterality Date  ? ABDOMINAL HYSTERECTOMY    ?  BREAST LUMPECTOMY    ? benign  ? CARDIAC CATHETERIZATION    ? 9 YRS AGO  ? COLONOSCOPY N/A 05/30/2012  ? Procedure: COLONOSCOPY;  Surgeon: Rogene Houston, MD;  Location: [facility];  Service: Endoscopy;  Laterality: N/A;  830-moved to 1125 Ann to notify pt  ? DILATION AND CURETTAGE OF UTERUS    ? X2  ? JOINT REPLACEMENT Right   ? right hip  ? Right Carpal Tunnel Release    ? 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  ? Procedure: LEFT TOTAL KNEE ARTHROPLASTY;  Surgeon: Gearlean Alf, MD;  Location: [facility];  Service: Orthopedics;  Laterality: Left;  ? TOTAL SHOULDER ARTHROPLASTY Right 06/17/2016  ? TOTAL SHOULDER ARTHROPLASTY Right 06/17/2016  ? Procedure: RIGHT TOTAL SHOULDER ARTHROPLASTY;  Surgeon: Netta Cedars, MD;  Location: [facility];  Service: Orthopedics;  Laterality: Right;  ? TUBAL LIGATION    ? ?Family History  ?Problem Relation Age of Onset  ? Dementia Mother   ? Hypertension Mother   ? Lupus Mother   ? Fibromyalgia Sister   ? Depression Sister   ? Bipolar disorder Daughter   ? Drug abuse Daughter   ? Breast cancer Maternal Aunt   ? Breast cancer Cousin   ? Breast cancer Cousin   ? Colon cancer Neg Hx   ? ?Social History  ? ?Socioeconomic History  ? Marital status: Married  ?  Spouse name: Legrand Como  ? Number of children: 3  ? Years of education: Not on file  ? Highest education level: Not on file  ?Occupational History  ? Occupation: retired  ?  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 Smoking status: Never  ? Smokeless tobacco: Never  ?Vaping Use  ? Vaping Use: Never used  ?Substance and Sexual Activity  ? Alcohol use: No  ? Drug use: No  ? Sexual activity: Yes  ?  Birth control/protection: Surgical  ?  Comment: hyst  ?Other Topics Concern  ? Not on file  ?Social History Narrative  ? 3 grandchildren live with her and her husband full time - she has been their full time caregiver since 2019  ? One of her daughters lives nearby  ? ?Social Determinants of Health  ? ?Financial Resource Strain: Low Risk   ? Difficulty  of Paying Living Expenses: Not hard at all  ?Food Insecurity: No Food Insecurity  ? Worried About Charity fundraiser in the Last Year: Never true  ? Ran Out of Food in the Last Year: Never true  ?Transportation Needs: No Transportation Needs  ? Lack of Transportation (Medical): No  ? Lack of Transportation (Non-Medical): No  ?Physical Activity: Insufficiently Active  ? Days of Exercise per Week: 7 days  ? Minutes of Exercise per Session: 20 min  ?Stress: No Stress Concern Present  ? Feeling of Stress : Only a little  ?Social Connections: Socially Integrated  ? Frequency of Communication with Friends and  Family: More than three times a week  ? Frequency of Social Gatherings with Friends and Family: More than three times a week  ? Attends Religious Services: More than 4 times per year  ? Active Member of Clubs or Organizations: Yes  ? Attends Archivist Meetings: More than 4 times per year  ? Marital Status: Married  ? ? ?Tobacco Counseling ?Counseling given: Not Answered ? ? ?Clinical Intake: ? ?Pre-visit preparation completed: Yes ? ?Pain : 0-10 ?Pain Score: 3  ?Pain Type: Chronic pain ?Pain Location: Generalized ?Pain Descriptors / Indicators: Aching, Discomfort ?Pain Onset: More than a month ago ?Pain Frequency: Intermittent ? ?  ? ?BMI - recorded: 33.81 ?Nutritional Status: BMI > 30  Obese ?Nutritional Risks: Nausea/ vomitting/ diarrhea (nausea intermittent since changing her diet) ?Diabetes: No ? ?How often do you need to have someone help you when you read instructions, pamphlets, or other written materials from your doctor or pharmacy?: 1 - Never ? ?Diabetic?no ? ?Interpreter Needed?: No ? ?Information entered by :: Nettye Flegal, LPN ? ? ?Activities of Daily Living ? ?  06/17/2021  ?  8:21 AM  ?In your present state of health, do you have any difficulty performing the following activities:  ?Hearing? 0  ?Vision? 0  ?Difficulty concentrating or making decisions? 0  ?Walking or climbing stairs? 0   ?Dressing or bathing? 0  ?Doing errands, shopping? 0  ?Preparing Food and eating ? N  ?Using the Toilet? N  ?In the past six months, have you accidently leaked urine? Y  ?Comment seems to be worse when she is

## 2021-07-30 ENCOUNTER — Other Ambulatory Visit: Payer: Self-pay | Admitting: Family Medicine

## 2021-07-30 DIAGNOSIS — E039 Hypothyroidism, unspecified: Secondary | ICD-10-CM

## 2021-08-04 ENCOUNTER — Encounter: Payer: Self-pay | Admitting: Family Medicine

## 2021-08-04 ENCOUNTER — Ambulatory Visit (INDEPENDENT_AMBULATORY_CARE_PROVIDER_SITE_OTHER): Payer: Medicare HMO | Admitting: Family Medicine

## 2021-08-04 VITALS — BP 114/78 | HR 76 | Temp 97.9°F | Ht 64.0 in | Wt 196.4 lb

## 2021-08-04 DIAGNOSIS — E785 Hyperlipidemia, unspecified: Secondary | ICD-10-CM | POA: Diagnosis not present

## 2021-08-04 DIAGNOSIS — K5909 Other constipation: Secondary | ICD-10-CM

## 2021-08-04 DIAGNOSIS — F418 Other specified anxiety disorders: Secondary | ICD-10-CM

## 2021-08-04 DIAGNOSIS — I10 Essential (primary) hypertension: Secondary | ICD-10-CM | POA: Diagnosis not present

## 2021-08-04 DIAGNOSIS — R69 Illness, unspecified: Secondary | ICD-10-CM | POA: Diagnosis not present

## 2021-08-04 DIAGNOSIS — E039 Hypothyroidism, unspecified: Secondary | ICD-10-CM

## 2021-08-04 MED ORDER — FLUTICASONE PROPIONATE 50 MCG/ACT NA SUSP: 2.0000 | Freq: Every day | NASAL | 6 refills | Status: AC

## 2021-08-04 MED ORDER — POLYETHYLENE GLYCOL 3350 17 GM/SCOOP PO POWD: 17.0000 g | Freq: Two times a day (BID) | ORAL | 5 refills | Status: AC | PRN

## 2021-08-04 MED ORDER — CLORAZEPATE DIPOTASSIUM 3.75 MG PO TABS: 3.7500 mg | ORAL_TABLET | Freq: Every evening | ORAL | 5 refills | Status: DC | PRN

## 2021-08-04 MED ORDER — LEVOTHYROXINE SODIUM 125 MCG PO TABS
125.0000 ug | ORAL_TABLET | Freq: Every day | ORAL | 5 refills | Status: DC
Start: 2021-08-04 — End: 2022-03-08

## 2021-08-04 MED ORDER — FEXOFENADINE HCL 180 MG PO TABS: 180.0000 mg | ORAL_TABLET | Freq: Every day | ORAL | 11 refills | Status: DC

## 2021-08-04 NOTE — Progress Notes (Signed)
Subjective:  Patient ID: Leslie Robbins, female    DOB: Nov 29, 1955  Age: 66 y.o. MRN: [id number]  CC: Medical Management of Chronic Issues   HPI Leslie Robbins presents for anxiety. Husband has started drinking. No longer going to church. Drinks from 1 pm until bedtime. Loud, forgetful. Making her more anxious. Currently has taken 7 chlorazepate in a month.    presents for  follow-up of hypertension. Patient has no history of headache chest pain or shortness of breath or recent cough. Patient also denies symptoms of TIA such as focal numbness or weakness. Patient denies side effects from medication. States taking amlodipiine regularly. Taking maxzide  3 times a week. When she swells or feels her BP is high.   follow-up on  thyroid. The patient has a history of hypothyroidism for many years. It has been stable recently. Pt. denies any change in  voice, loss of hair, heat or cold intolerance. Energy level has been adequate to good. Patient denies constipation and diarrhea. No myxedema. Medication is as noted below. Verified that pt is taking it daily on an empty stomach. Well tolerated. Does well at 125 mcg, but feels very sluggish at 112 mcg        08/04/2021    8:27 AM 06/17/2021    8:29 AM 03/30/2021   10:11 AM  Depression screen PHQ 2/9  Decreased Interest 0 1 2  Down, Depressed, Hopeless 0 1 2  PHQ - 2 Score 0 2 4  Altered sleeping  1 1  Tired, decreased energy  1 1  Change in appetite  0 1  Feeling bad or failure about yourself   0 0  Trouble concentrating  0 0  Moving slowly or fidgety/restless  0 1  Suicidal thoughts  0 0  PHQ-9 Score  4 8  Difficult doing work/chores  Somewhat difficult Somewhat difficult    History Leslie Robbins has a past medical history of Anxiety, Arthritis, Depression, GERD (gastroesophageal reflux disease), History of hiatal hernia, Hypercholesteremia, Hypertension, and Hypothyroidism.   She has a past surgical history that includes Abdominal hysterectomy; Tubal  ligation; Right Carpal Tunnel Release; Breast lumpectomy; Colonoscopy (N/A, 05/30/2012); Dilation and curettage of uterus; Cardiac catheterization; Total knee arthroplasty (Left, 09/16/2013); Joint replacement (Right); Total shoulder arthroplasty (Right, 06/17/2016); and Total shoulder arthroplasty (Right, 06/17/2016).   Her family history includes Bipolar disorder in her daughter; Breast cancer in her cousin, cousin, and maternal aunt; Dementia in her mother; Depression in her sister; Drug abuse in her daughter; Fibromyalgia in her sister; Hypertension in her mother; Lupus in her mother.She reports that she has never smoked. She has never used smokeless tobacco. She reports that she does not drink alcohol and does not use drugs.    ROS Review of Systems  Constitutional: Negative.   HENT: Negative.    Eyes:  Negative for visual disturbance.  Respiratory:  Negative for shortness of breath.   Cardiovascular:  Negative for chest pain.  Gastrointestinal:  Positive for constipation (doing well with miralax). Negative for abdominal pain.  Musculoskeletal:  Negative for arthralgias.   Objective:  BP 114/78   Pulse 76   Temp 97.9 F (36.6 C)   Ht $R'5\' 4"'ay$  (1.626 m)   Wt 196 lb 6.4 oz (89.1 kg)   SpO2 98%   BMI 33.71 kg/m   BP Readings from Last 3 Encounters:  08/04/21 114/78  02/02/21 125/82  01/13/21 132/86    Wt Readings from Last 3 Encounters:  08/04/21 196 lb 6.4  oz (89.1 kg)  06/17/21 197 lb (89.4 kg)  02/02/21 207 lb 12.8 oz (94.3 kg)     Physical Exam Constitutional:      General: She is not in acute distress.    Appearance: She is well-developed.  Cardiovascular:     Rate and Rhythm: Normal rate and regular rhythm.  Pulmonary:     Breath sounds: Normal breath sounds.  Musculoskeletal:        General: Normal range of motion.  Skin:    General: Skin is warm and dry.  Neurological:     Mental Status: She is alert and oriented to person, place, and time.       Assessment & Plan:   Leslie Robbins was seen today for medical management of chronic issues.  Diagnoses and all orders for this visit:  Hypothyroidism, unspecified type -     TSH + free T4 -     levothyroxine (SYNTHROID) 125 MCG tablet; Take 1 tablet (125 mcg total) by mouth daily. -     T3, free  Essential hypertension -     CBC with Differential/Platelet -     CMP14+EGFR  Hyperlipidemia with target LDL less than 100 -     Lipid panel  Depression with anxiety -     clorazepate (TRANXENE) 3.75 MG tablet; Take 1 tablet (3.75 mg total) by mouth at bedtime as needed for anxiety.  Other constipation  Other orders -     polyethylene glycol powder (GLYCOLAX/MIRALAX) 17 GM/SCOOP powder; Take 17 g by mouth 2 (two) times daily as needed for moderate constipation. For constipation -     fluticasone (FLONASE) 50 MCG/ACT nasal spray; Place 2 sprays into both nostrils daily. -     fexofenadine (ALLEGRA) 180 MG tablet; Take 1 tablet (180 mg total) by mouth daily. For allergy symptoms       I have changed Leslie Robbins. Leslie Robbins's levothyroxine. I am also having her start on polyethylene glycol powder, fluticasone, and fexofenadine. Additionally, I am having her maintain her aspirin EC, cetirizine, Clinpro 5000, meloxicam, amLODipine, DULoxetine, omeprazole, rosuvastatin, triamterene-hydrochlorothiazide, albuterol, estradiol, and clorazepate.  Allergies as of 08/04/2021       Reactions   Seroquel [quetiapine] Other (See Comments)   Seroquel anger, hostility, increased anxiety   Sulfonamide Derivatives Rash   SULFONAMIDES INCREASE SENSITIVITY   TO SUNLIGHT > MORE LIKELY TO SUNBURN SUNBURN-LIKE REDNESS        Medication List        Accurate as of Aug 04, 2021  9:15 AM. If you have any questions, ask your nurse or doctor.          albuterol 108 (90 Base) MCG/ACT inhaler Commonly known as: VENTOLIN HFA Inhale 2 puffs into the lungs every 6 (six) hours as needed for wheezing or  shortness of breath.   amLODipine 5 MG tablet Commonly known as: NORVASC Take 1 tablet (5 mg total) by mouth daily.   aspirin EC 81 MG tablet Take 81 mg by mouth daily. WILL STOP PRIOR TO PROCEDURE   cetirizine 10 MG tablet Commonly known as: ZYRTEC Take 10 mg by mouth daily as needed for allergies.   Clinpro 5000 1.1 % Pste Generic drug: Sodium Fluoride Place onto teeth at bedtime.   clorazepate 3.75 MG tablet Commonly known as: TRANXENE Take 1 tablet (3.75 mg total) by mouth at bedtime as needed for anxiety.   DULoxetine 60 MG capsule Commonly known as: CYMBALTA Take 1 capsule (60 mg total) by mouth 2 (two) times  daily.   estradiol 0.1 MG/GM vaginal cream Commonly known as: ESTRACE PLACE 2 GRAMS VAGINALLY AS DIRECTED EVERY OTHER NIGHT   fexofenadine 180 MG tablet Commonly known as: ALLEGRA Take 1 tablet (180 mg total) by mouth daily. For allergy symptoms Started by: Claretta Fraise, MD   fluticasone 50 MCG/ACT nasal spray Commonly known as: FLONASE Place 2 sprays into both nostrils daily. Started by: Claretta Fraise, MD   levothyroxine 125 MCG tablet Commonly known as: SYNTHROID Take 1 tablet (125 mcg total) by mouth daily.   meloxicam 15 MG tablet Commonly known as: MOBIC TAKE 1 TABLET (15 MG TOTAL) BY MOUTH DAILY.   omeprazole 40 MG capsule Commonly known as: PRILOSEC Take 1 capsule (40 mg total) by mouth daily.   polyethylene glycol powder 17 GM/SCOOP powder Commonly known as: GLYCOLAX/MIRALAX Take 17 g by mouth 2 (two) times daily as needed for moderate constipation. For constipation Started by: Claretta Fraise, MD   rosuvastatin 20 MG tablet Commonly known as: CRESTOR Take 1 tablet (20 mg total) by mouth daily.   triamterene-hydrochlorothiazide 37.5-25 MG tablet Commonly known as: MAXZIDE-25 Take 1 tablet by mouth daily. As needed for swelling       Monitor BP at home for 130/80at rest. Okay to hold fluid pill if less than this.   Follow-up:  Return in about 6 months (around 02/03/2022) for [facility] physical.  Claretta Fraise, M.D.

## 2021-08-05 LAB — CBC WITH DIFFERENTIAL/PLATELET
Basophils Absolute: 0.1 10*3/uL (ref 0.0–0.2)
Basos: 2 %
EOS (ABSOLUTE): 0.3 10*3/uL (ref 0.0–0.4)
Eos: 7 %
Hematocrit: 37.8 % (ref 34.0–46.6)
Hemoglobin: 11.3 g/dL (ref 11.1–15.9)
Immature Grans (Abs): 0 10*3/uL (ref 0.0–0.1)
Immature Granulocytes: 0 %
Lymphocytes Absolute: 1.3 10*3/uL (ref 0.7–3.1)
Lymphs: 26 %
MCH: 22.7 pg — ABNORMAL LOW (ref 26.6–33.0)
MCHC: 29.9 g/dL — ABNORMAL LOW (ref 31.5–35.7)
MCV: 76 fL — ABNORMAL LOW (ref 79–97)
Monocytes Absolute: 0.3 10*3/uL (ref 0.1–0.9)
Monocytes: 6 %
Neutrophils Absolute: 3 10*3/uL (ref 1.4–7.0)
Neutrophils: 59 %
Platelets: 276 10*3/uL (ref 150–450)
RBC: 4.97 x10E6/uL (ref 3.77–5.28)
RDW: 15.2 % (ref 11.7–15.4)
WBC: 5 10*3/uL (ref 3.4–10.8)

## 2021-08-05 LAB — CMP14+EGFR
ALT: 17 IU/L (ref 0–32)
AST: 20 IU/L (ref 0–40)
Albumin/Globulin Ratio: 1.5 (ref 1.2–2.2)
Albumin: 4 g/dL (ref 3.8–4.8)
Alkaline Phosphatase: 152 IU/L — ABNORMAL HIGH (ref 44–121)
BUN/Creatinine Ratio: 18 (ref 12–28)
BUN: 23 mg/dL (ref 8–27)
Bilirubin Total: 0.5 mg/dL (ref 0.0–1.2)
CO2: 24 mmol/L (ref 20–29)
Calcium: 10.2 mg/dL (ref 8.7–10.3)
Chloride: 104 mmol/L (ref 96–106)
Creatinine, Ser: 1.25 mg/dL — ABNORMAL HIGH (ref 0.57–1.00)
Globulin, Total: 2.6 g/dL (ref 1.5–4.5)
Glucose: 92 mg/dL (ref 70–99)
Potassium: 4.8 mmol/L (ref 3.5–5.2)
Sodium: 141 mmol/L (ref 134–144)
Total Protein: 6.6 g/dL (ref 6.0–8.5)
eGFR: 47 mL/min/{1.73_m2} — ABNORMAL LOW (ref >59)

## 2021-08-05 LAB — LIPID PANEL
Chol/HDL Ratio: 2.5 ratio (ref 0.0–4.4)
Cholesterol, Total: 114 mg/dL (ref 100–199)
HDL: 46 mg/dL (ref >39)
LDL Chol Calc (NIH): 50 mg/dL (ref 0–99)
Triglycerides: 92 mg/dL (ref 0–149)
VLDL Cholesterol Cal: 18 mg/dL (ref 5–40)

## 2021-08-05 LAB — TSH+FREE T4
Free T4: 1.75 ng/dL (ref 0.82–1.77)
TSH: 0.016 u[IU]/mL — ABNORMAL LOW (ref 0.450–4.500)

## 2021-08-05 LAB — T3, FREE: T3, Free: 3.7 pg/mL (ref 2.0–4.4)

## 2021-09-02 ENCOUNTER — Other Ambulatory Visit: Payer: Self-pay | Admitting: Family Medicine

## 2021-09-02 DIAGNOSIS — E039 Hypothyroidism, unspecified: Secondary | ICD-10-CM

## 2021-09-16 ENCOUNTER — Ambulatory Visit (INDEPENDENT_AMBULATORY_CARE_PROVIDER_SITE_OTHER): Payer: Medicare HMO | Admitting: Family Medicine

## 2021-09-16 ENCOUNTER — Encounter: Payer: Self-pay | Admitting: Family Medicine

## 2021-09-16 VITALS — BP 124/89 | HR 75 | Temp 97.6°F | Ht 64.0 in | Wt 193.4 lb

## 2021-09-16 DIAGNOSIS — R300 Dysuria: Secondary | ICD-10-CM

## 2021-09-16 DIAGNOSIS — B379 Candidiasis, unspecified: Secondary | ICD-10-CM

## 2021-09-16 DIAGNOSIS — N3001 Acute cystitis with hematuria: Secondary | ICD-10-CM

## 2021-09-16 LAB — URINALYSIS, ROUTINE W REFLEX MICROSCOPIC
Bilirubin, UA: NEGATIVE
Glucose, UA: NEGATIVE
Ketones, UA: NEGATIVE
Nitrite, UA: NEGATIVE
Specific Gravity, UA: 1.02 (ref 1.005–1.030)
Urobilinogen, Ur: 1 mg/dL (ref 0.2–1.0)
pH, UA: 7 (ref 5.0–7.5)

## 2021-09-16 LAB — MICROSCOPIC EXAMINATION
Epithelial Cells (non renal): >10 /hpf — ABNORMAL HIGH (ref 0–10)
Renal Epithel, UA: NONE SEEN /hpf

## 2021-09-16 MED ORDER — CEFDINIR 300 MG PO CAPS: 300.0000 mg | ORAL_CAPSULE | Freq: Two times a day (BID) | ORAL | 0 refills | Status: AC

## 2021-09-16 MED ORDER — FLUCONAZOLE 150 MG PO TABS: 150.0000 mg | ORAL_TABLET | Freq: Once | ORAL | 0 refills | Status: AC

## 2021-09-16 NOTE — Progress Notes (Signed)
Assessment & Plan:  1. Acute cystitis with hematuria Education provided on UTIs. Encouraged adequate hydration.  - Urine Culture - cefdinir (OMNICEF) 300 MG capsule; Take 1 capsule (300 mg total) by mouth 2 (two) times daily for 7 days.  Dispense: 14 capsule; Refill: 0  2. Dysuria - Urinalysis, Routine w reflex microscopic - Urine dipstick shows positive for RBC's, positive for protein, and positive for leukocytes.  Micro exam: 6-10 WBC's per HPF, 0-2 RBC's per HPF, and moderate bacteria and yeast.  3. Yeast infection - fluconazole (DIFLUCAN) 150 MG tablet; Take 1 tablet (150 mg total) by mouth once for 1 dose. May repeat after 3 days if needed.  Dispense: 2 tablet; Refill: 0   Follow up plan: Return if symptoms worsen or fail to improve.  Hendricks Limes, MSN, APRN, FNP-C [facility]  Subjective:   Patient ID: Leslie Robbins, female    DOB: June 02, 1955, 66 y.o.   MRN: [id number]  HPI: Leslie Robbins is a 66 y.o. female presenting on 09/16/2021 for Dysuria (Few months on and off but takes AZO and will clear it up.  Last took AZO a week ago. ), Hematuria (Patient states it happened once last week. ), and Back Pain (X 2-3 days )  Patient complains of dysuria and hematuria. She has had symptoms for a few months. Patient also complains of back pain. Patient denies fever. Patient does have a history of recurrent UTI.  Patient does not have a history of pyelonephritis. She does have vaginal dryness and uses estradiol vaginally.   ROS: Negative unless specifically indicated above in HPI.   Relevant past medical history reviewed and updated as indicated.   Allergies and medications reviewed and updated.   Current Outpatient Medications:    albuterol (VENTOLIN HFA) 108 (90 Base) MCG/ACT inhaler, INHALE 2 PUFFS BY MOUTH EVERY 6 HOURS AS NEEDED FOR WHEEZE OR SHORTNESS OF BREATH, Disp: 8.5 each, Rfl: 2   amLODipine (NORVASC) 5 MG tablet, Take 1 tablet (5 mg total) by mouth  daily., Disp: 90 tablet, Rfl: 3   aspirin EC 81 MG tablet, Take 81 mg by mouth daily. WILL STOP PRIOR TO PROCEDURE, Disp: , Rfl:    cetirizine (ZYRTEC) 10 MG tablet, Take 10 mg by mouth daily as needed for allergies., Disp: , Rfl:    CLINPRO 5000 1.1 % PSTE, Place onto teeth at bedtime., Disp: , Rfl:    clorazepate (TRANXENE) 3.75 MG tablet, Take 1 tablet (3.75 mg total) by mouth at bedtime as needed for anxiety., Disp: 15 tablet, Rfl: 5   DULoxetine (CYMBALTA) 60 MG capsule, Take 1 capsule (60 mg total) by mouth 2 (two) times daily., Disp: 180 capsule, Rfl: 3   estradiol (ESTRACE) 0.1 MG/GM vaginal cream, PLACE 2 GRAMS VAGINALLY AS DIRECTED EVERY OTHER NIGHT, Disp: 42.5 g, Rfl: 12   fexofenadine (ALLEGRA) 180 MG tablet, Take 1 tablet (180 mg total) by mouth daily. For allergy symptoms, Disp: 30 tablet, Rfl: 11   fluticasone (FLONASE) 50 MCG/ACT nasal spray, Place 2 sprays into both nostrils daily., Disp: 16 g, Rfl: 6   levothyroxine (SYNTHROID) 125 MCG tablet, Take 1 tablet (125 mcg total) by mouth daily., Disp: 30 tablet, Rfl: 5   meloxicam (MOBIC) 15 MG tablet, TAKE 1 TABLET (15 MG TOTAL) BY MOUTH DAILY., Disp: 90 tablet, Rfl: 0   omeprazole (PRILOSEC) 40 MG capsule, Take 1 capsule (40 mg total) by mouth daily., Disp: 90 capsule, Rfl: 3   polyethylene glycol powder (GLYCOLAX/MIRALAX)  17 GM/SCOOP powder, Take 17 g by mouth 2 (two) times daily as needed for moderate constipation. For constipation, Disp: 3350 g, Rfl: 5   rosuvastatin (CRESTOR) 20 MG tablet, Take 1 tablet (20 mg total) by mouth daily., Disp: 90 tablet, Rfl: 3   triamterene-hydrochlorothiazide (MAXZIDE-25) 37.5-25 MG tablet, Take 1 tablet by mouth daily. As needed for swelling, Disp: 90 tablet, Rfl: 3  Allergies  Allergen Reactions   Sulfa Antibiotics Rash   Seroquel [Quetiapine] Other (See Comments)    Seroquel anger, hostility, increased anxiety   Sulfonamide Derivatives Rash    SULFONAMIDES INCREASE SENSITIVITY   TO SUNLIGHT  > MORE LIKELY TO SUNBURN SUNBURN-LIKE REDNESS    Objective:   BP 124/89   Pulse 75   Temp 97.6 F (36.4 C) (Temporal)   Ht '5\' 4"'$  (1.626 m)   Wt 193 lb 6.4 oz (87.7 kg)   SpO2 98%   BMI 33.20 kg/m    Physical Exam Vitals reviewed.  Constitutional:      General: She is not in acute distress.    Appearance: Normal appearance. She is not ill-appearing, toxic-appearing or diaphoretic.  HENT:     Head: Normocephalic and atraumatic.  Eyes:     General: No scleral icterus.       Right eye: No discharge.        Left eye: No discharge.     Conjunctiva/sclera: Conjunctivae normal.  Cardiovascular:     Rate and Rhythm: Normal rate.  Pulmonary:     Effort: Pulmonary effort is normal. No respiratory distress.  Musculoskeletal:        General: Normal range of motion.     Cervical back: Normal range of motion.  Skin:    General: Skin is warm and dry.     Capillary Refill: Capillary refill takes less than 2 seconds.  Neurological:     General: No focal deficit present.     Mental Status: She is alert and oriented to person, place, and time. Mental status is at baseline.  Psychiatric:        Mood and Affect: Mood normal.        Behavior: Behavior normal.        Thought Content: Thought content normal.        Judgment: Judgment normal.

## 2021-09-17 ENCOUNTER — Other Ambulatory Visit: Payer: Self-pay | Admitting: Family Medicine

## 2021-09-17 DIAGNOSIS — M1712 Unilateral primary osteoarthritis, left knee: Secondary | ICD-10-CM

## 2021-09-17 LAB — URINE CULTURE

## 2021-11-01 ENCOUNTER — Telehealth: Payer: Self-pay | Admitting: Family Medicine

## 2021-11-01 NOTE — Telephone Encounter (Signed)
Cardio genetic testing request paperwork was received via fax from [employer] and was put on Dr Livia Snellen desk to sign and send back.

## 2021-11-15 ENCOUNTER — Other Ambulatory Visit: Payer: Self-pay | Admitting: Family Medicine

## 2021-12-24 DIAGNOSIS — H43393 Other vitreous opacities, bilateral: Secondary | ICD-10-CM | POA: Diagnosis not present

## 2021-12-24 DIAGNOSIS — H5203 Hypermetropia, bilateral: Secondary | ICD-10-CM | POA: Diagnosis not present

## 2021-12-24 DIAGNOSIS — H52223 Regular astigmatism, bilateral: Secondary | ICD-10-CM | POA: Diagnosis not present

## 2021-12-24 DIAGNOSIS — H2513 Age-related nuclear cataract, bilateral: Secondary | ICD-10-CM | POA: Diagnosis not present

## 2021-12-24 DIAGNOSIS — H524 Presbyopia: Secondary | ICD-10-CM | POA: Diagnosis not present

## 2021-12-29 ENCOUNTER — Other Ambulatory Visit: Payer: Self-pay | Admitting: Family Medicine

## 2022-02-02 ENCOUNTER — Ambulatory Visit (INDEPENDENT_AMBULATORY_CARE_PROVIDER_SITE_OTHER): Payer: Medicare HMO | Admitting: Family Medicine

## 2022-02-02 ENCOUNTER — Encounter: Payer: Self-pay | Admitting: Family Medicine

## 2022-02-02 VITALS — BP 125/78 | HR 90 | Temp 97.9°F | Ht 64.0 in | Wt 196.6 lb

## 2022-02-02 DIAGNOSIS — R1011 Right upper quadrant pain: Secondary | ICD-10-CM

## 2022-02-02 DIAGNOSIS — E785 Hyperlipidemia, unspecified: Secondary | ICD-10-CM

## 2022-02-02 DIAGNOSIS — Z0001 Encounter for general adult medical examination with abnormal findings: Secondary | ICD-10-CM

## 2022-02-02 DIAGNOSIS — I10 Essential (primary) hypertension: Secondary | ICD-10-CM | POA: Diagnosis not present

## 2022-02-02 DIAGNOSIS — F411 Generalized anxiety disorder: Secondary | ICD-10-CM

## 2022-02-02 DIAGNOSIS — M1712 Unilateral primary osteoarthritis, left knee: Secondary | ICD-10-CM | POA: Diagnosis not present

## 2022-02-02 DIAGNOSIS — R799 Abnormal finding of blood chemistry, unspecified: Secondary | ICD-10-CM | POA: Diagnosis not present

## 2022-02-02 DIAGNOSIS — E039 Hypothyroidism, unspecified: Secondary | ICD-10-CM

## 2022-02-02 DIAGNOSIS — Z23 Encounter for immunization: Secondary | ICD-10-CM | POA: Diagnosis not present

## 2022-02-02 DIAGNOSIS — F418 Other specified anxiety disorders: Secondary | ICD-10-CM | POA: Diagnosis not present

## 2022-02-02 DIAGNOSIS — Z0000 Encounter for general adult medical examination without abnormal findings: Secondary | ICD-10-CM | POA: Diagnosis not present

## 2022-02-02 DIAGNOSIS — R69 Illness, unspecified: Secondary | ICD-10-CM | POA: Diagnosis not present

## 2022-02-02 DIAGNOSIS — R1084 Generalized abdominal pain: Secondary | ICD-10-CM

## 2022-02-02 DIAGNOSIS — R202 Paresthesia of skin: Secondary | ICD-10-CM | POA: Diagnosis not present

## 2022-02-02 DIAGNOSIS — E559 Vitamin D deficiency, unspecified: Secondary | ICD-10-CM | POA: Diagnosis not present

## 2022-02-02 DIAGNOSIS — R198 Other specified symptoms and signs involving the digestive system and abdomen: Secondary | ICD-10-CM

## 2022-02-02 LAB — URINALYSIS
Bilirubin, UA: NEGATIVE
Glucose, UA: NEGATIVE
Ketones, UA: NEGATIVE
Nitrite, UA: NEGATIVE
Protein,UA: NEGATIVE
RBC, UA: NEGATIVE
Specific Gravity, UA: 1.015 (ref 1.005–1.030)
Urobilinogen, Ur: 0.2 mg/dL (ref 0.2–1.0)
pH, UA: 7.5 (ref 5.0–7.5)

## 2022-02-02 MED ORDER — MELOXICAM 15 MG PO TABS: 15.0000 mg | ORAL_TABLET | Freq: Every day | ORAL | 3 refills | Status: DC

## 2022-02-02 MED ORDER — PREDNISONE 10 MG PO TABS: ORAL_TABLET | ORAL | 0 refills | Status: DC

## 2022-02-02 MED ORDER — MOXIFLOXACIN HCL 400 MG PO TABS: 400.0000 mg | ORAL_TABLET | Freq: Every day | ORAL | 0 refills | Status: DC

## 2022-02-02 MED ORDER — CLORAZEPATE DIPOTASSIUM 3.75 MG PO TABS: 3.7500 mg | ORAL_TABLET | Freq: Every evening | ORAL | 5 refills | Status: DC | PRN

## 2022-02-02 NOTE — Progress Notes (Signed)
Subjective:  Patient ID: Leslie Robbins, female    DOB: Dec 18, 1955  Age: 66 y.o. MRN: [id number]  CC: Annual Exam   HPI Leslie Robbins presents for Annual exam. Having dizziness with walking after sitting. Occurss intermittently . Onset 2 mos. Ago associated with HA     02/02/2022   10:30 AM 02/02/2022   10:01 AM 09/16/2021    9:35 AM  Depression screen PHQ 2/9  Decreased Interest 1 0 1  Down, Depressed, Hopeless 1 0 1  PHQ - 2 Score 2 0 2  Altered sleeping 1  2  Tired, decreased energy 3  2  Change in appetite 3  0  Feeling bad or failure about yourself  0  0  Trouble concentrating 0  0  Moving slowly or fidgety/restless 0  0  Suicidal thoughts 0  0  PHQ-9 Score 9  6  Difficult doing work/chores Somewhat difficult  Not difficult at all    History Leslie Robbins has a past medical history of Anxiety, Arthritis, Depression, GERD (gastroesophageal reflux disease), History of hiatal hernia, Hypercholesteremia, Hypertension, and Hypothyroidism.   Leslie Robbins has a past surgical history that includes Abdominal hysterectomy; Tubal ligation; Right Carpal Tunnel Release; Breast lumpectomy; Colonoscopy (N/A, 05/30/2012); Dilation and curettage of uterus; Cardiac catheterization; Total knee arthroplasty (Left, 09/16/2013); Joint replacement (Right); Total shoulder arthroplasty (Right, 06/17/2016); and Total shoulder arthroplasty (Right, 06/17/2016).   Leslie Robbins family history includes Bipolar disorder in Leslie Robbins daughter; Breast cancer in Leslie Robbins cousin, cousin, and maternal aunt; Dementia in Leslie Robbins mother; Depression in Leslie Robbins sister; Drug abuse in Leslie Robbins daughter; Fibromyalgia in Leslie Robbins sister; Hypertension in Leslie Robbins mother; Lupus in Leslie Robbins mother.Leslie Robbins reports that Leslie Robbins has never smoked. Leslie Robbins has never used smokeless tobacco. Leslie Robbins reports that Leslie Robbins does not drink alcohol and does not use drugs.    ROS Review of Systems  Constitutional:  Negative for appetite change, chills, diaphoresis, fatigue, fever and unexpected weight change.  HENT:   Positive for mouth sores (dry all the time. Gets blisters at times.). Negative for congestion, ear pain, hearing loss, postnasal drip, rhinorrhea, sneezing, sore throat and trouble swallowing.   Eyes:  Negative for pain.  Respiratory:  Negative for cough, chest tightness and shortness of breath.   Cardiovascular:  Negative for chest pain and palpitations.  Gastrointestinal:  Positive for abdominal pain (worsening reflux). Negative for constipation, diarrhea, nausea and vomiting.  Endocrine: Negative for cold intolerance, heat intolerance, polydipsia, polyphagia and polyuria.  Genitourinary:  Positive for dysuria (intermittent) and frequency (3-4 times a night.). Negative for menstrual problem.  Musculoskeletal:  Positive for arthralgias (multiple.. Some intermittently t low back is quiescent.). Negative for joint swelling.  Skin:  Negative for rash.       Feeling of tingling, crawling sensations at times.  Allergic/Immunologic: Negative for environmental allergies.  Neurological:  Negative for dizziness, weakness, numbness and headaches.  Psychiatric/Behavioral:  Negative for agitation and dysphoric mood.     Objective:  BP 125/78   Pulse 90   Temp 97.9 F (36.6 C)   Ht _0  (1.626 m)   Wt 196 lb 9.6 oz (89.2 kg)   SpO2 96%   BMI 33.75 kg/m   BP Readings from Last 3 Encounters:  02/02/22 125/78  09/16/21 124/89  08/04/21 114/78    Wt Readings from Last 3 Encounters:  02/02/22 196 lb 9.6 oz (89.2 kg)  09/16/21 193 lb 6.4 oz (87.7 kg)  08/04/21 196 lb 6.4 oz (89.1 kg)     Physical Exam Constitutional:  General: Leslie Robbins is not in acute distress.    Appearance: Normal appearance. Leslie Robbins is well-developed.  HENT:     Head: Normocephalic and atraumatic.     Right Ear: External ear normal.     Left Ear: External ear normal.     Nose: Congestion present.  Eyes:     Conjunctiva/sclera: Conjunctivae normal.     Pupils: Pupils are equal, round, and reactive to light.  Neck:      Thyroid: No thyromegaly.  Cardiovascular:     Rate and Rhythm: Normal rate and regular rhythm.     Heart sounds: Normal heart sounds. No murmur heard. Pulmonary:     Effort: Pulmonary effort is normal. No respiratory distress.     Breath sounds: Normal breath sounds. No wheezing or rales.  Chest:  Breasts:    Breasts are symmetrical.     Right: No inverted nipple, mass or tenderness.     Left: No inverted nipple, mass or tenderness.  Abdominal:     General: Bowel sounds are normal. There is distension. There is no abdominal bruit.     Palpations: Abdomen is soft. There is no hepatomegaly, splenomegaly or mass.     Tenderness: There is generalized abdominal tenderness and tenderness in the right upper quadrant and epigastric area. Positive signs include Murphy's sign. Negative signs include McBurney's sign.  Musculoskeletal:        General: No tenderness. Normal range of motion.     Cervical back: Normal range of motion and neck supple.  Lymphadenopathy:     Cervical: No cervical adenopathy.  Skin:    General: Skin is warm and dry.     Findings: No rash.  Neurological:     Mental Status: Leslie Robbins is alert and oriented to person, place, and time.     Deep Tendon Reflexes: Reflexes are normal and symmetric.  Psychiatric:        Behavior: Behavior normal.        Thought Content: Thought content normal.        Judgment: Judgment normal.       Assessment & Plan:   Leslie Robbins was seen today for annual exam.  Diagnoses and all orders for this visit:  Well adult exam -     CBC with Differential/Platelet -     CMP14+EGFR  Primary osteoarthritis of left knee -     meloxicam (MOBIC) 15 MG tablet; Take 1 tablet (15 mg total) by mouth daily. -     CBC with Differential/Platelet -     CMP14+EGFR  Hypothyroidism, unspecified type -     CBC with Differential/Platelet -     CMP14+EGFR -     TSH + free T4  Essential hypertension -     CBC with Differential/Platelet -     CMP14+EGFR -      Urinalysis  Depression with anxiety -     CBC with Differential/Platelet -     CMP14+EGFR -     clorazepate (TRANXENE) 3.75 MG tablet; Take 1 tablet (3.75 mg total) by mouth at bedtime as needed for anxiety.  GAD (generalized anxiety disorder) -     CBC with Differential/Platelet -     CMP14+EGFR  Hyperlipidemia with target LDL less than 100 -     CBC with Differential/Platelet -     CMP14+EGFR -     Lipid panel  Paresthesias -     Vitamin B12 -     CBC with Differential/Platelet -     CMP14+EGFR  Vitamin D deficiency -     VITAMIN D 25 Hydroxy (Vit-D Deficiency, Fractures)  Right upper quadrant abdominal pain with positive Murphy's Sign -     US Abdomen Complete; Future  Diffuse abdominal pain -     US Abdomen Complete; Future  Other orders -     moxifloxacin (AVELOX) 400 MG tablet; Take 1 tablet (400 mg total) by mouth daily. -     predniSONE (DELTASONE) 10 MG tablet; Take 5 daily for 3 days followed by 4,3,2 and 1 for 3 days each.       Leslie have discontinued Matalie Romberger. [place]'s oseltamivir. Leslie am also having Leslie Robbins Robbins on moxifloxacin and predniSONE. Additionally, Leslie am having Leslie Robbins maintain Leslie Robbins aspirin EC, cetirizine, Clinpro 5000, amLODipine, DULoxetine, omeprazole, rosuvastatin, triamterene-hydrochlorothiazide, estradiol, levothyroxine, polyethylene glycol powder, fluticasone, fexofenadine, albuterol, meloxicam, and clorazepate.  Allergies as of 02/02/2022       Reactions   Sulfa Antibiotics Rash   Seroquel [quetiapine] Other (See Comments)   Seroquel anger, hostility, increased anxiety   Sulfonamide Derivatives Rash   SULFONAMIDES INCREASE SENSITIVITY   TO SUNLIGHT > MORE LIKELY TO SUNBURN SUNBURN-LIKE REDNESS        Medication List        Accurate as of February 02, 2022 11:23 AM. If you have any questions, ask your nurse or doctor.          STOP taking these medications    oseltamivir 75 MG capsule Commonly known as: Tamiflu Stopped by: Claretta Fraise, MD       TAKE these medications    albuterol 108 (90 Base) MCG/ACT inhaler Commonly known as: VENTOLIN HFA INHALE 2 PUFFS BY MOUTH EVERY 6 HOURS AS NEEDED FOR WHEEZE OR SHORTNESS OF BREATH   amLODipine 5 MG tablet Commonly known as: NORVASC Take 1 tablet (5 mg total) by mouth daily.   aspirin EC 81 MG tablet Take 81 mg by mouth daily. WILL STOP PRIOR TO PROCEDURE   cetirizine 10 MG tablet Commonly known as: ZYRTEC Take 10 mg by mouth daily as needed for allergies.   Clinpro 5000 1.1 % Pste Generic drug: Sodium Fluoride Place onto teeth at bedtime.   clorazepate 3.75 MG tablet Commonly known as: TRANXENE Take 1 tablet (3.75 mg total) by mouth at bedtime as needed for anxiety.   DULoxetine 60 MG capsule Commonly known as: CYMBALTA Take 1 capsule (60 mg total) by mouth 2 (two) times daily.   estradiol 0.1 MG/GM vaginal cream Commonly known as: ESTRACE PLACE 2 GRAMS VAGINALLY AS DIRECTED EVERY OTHER NIGHT   fexofenadine 180 MG tablet Commonly known as: ALLEGRA Take 1 tablet (180 mg total) by mouth daily. For allergy symptoms   fluticasone 50 MCG/ACT nasal spray Commonly known as: FLONASE Place 2 sprays into both nostrils daily.   levothyroxine 125 MCG tablet Commonly known as: SYNTHROID Take 1 tablet (125 mcg total) by mouth daily.   meloxicam 15 MG tablet Commonly known as: MOBIC Take 1 tablet (15 mg total) by mouth daily. What changed: how much to take Changed by: Claretta Fraise, MD   moxifloxacin 400 MG tablet Commonly known as: AVELOX Take 1 tablet (400 mg total) by mouth daily. Started by: Claretta Fraise, MD   omeprazole 40 MG capsule Commonly known as: PRILOSEC Take 1 capsule (40 mg total) by mouth daily.   polyethylene glycol powder 17 GM/SCOOP powder Commonly known as: GLYCOLAX/MIRALAX Take 17 g by mouth 2 (two) times daily as needed for moderate constipation. For constipation  predniSONE 10 MG tablet Commonly known as: DELTASONE Take  5 daily for 3 days followed by 4,3,2 and 1 for 3 days each. Started by: Claretta Fraise, MD   rosuvastatin 20 MG tablet Commonly known as: CRESTOR Take 1 tablet (20 mg total) by mouth daily.   triamterene-hydrochlorothiazide 37.5-25 MG tablet Commonly known as: MAXZIDE-25 Take 1 tablet by mouth daily. As needed for swelling         Follow-up: Return in about 6 months (around 08/03/2022).  Claretta Fraise, M.D.

## 2022-02-02 NOTE — Addendum Note (Signed)
Addended by: Baldomero Lamy B on: 02/02/2022 04:39 PM   Modules accepted: Orders

## 2022-02-03 ENCOUNTER — Other Ambulatory Visit (HOSPITAL_COMMUNITY): Payer: Self-pay

## 2022-02-03 ENCOUNTER — Telehealth: Payer: Self-pay

## 2022-02-03 LAB — CMP14+EGFR
ALT: 21 IU/L (ref 0–32)
AST: 23 IU/L (ref 0–40)
Albumin/Globulin Ratio: 1.6 (ref 1.2–2.2)
Albumin: 4.4 g/dL (ref 3.9–4.9)
Alkaline Phosphatase: 151 IU/L — ABNORMAL HIGH (ref 44–121)
BUN/Creatinine Ratio: 20 (ref 12–28)
BUN: 28 mg/dL — ABNORMAL HIGH (ref 8–27)
Bilirubin Total: 0.5 mg/dL (ref 0.0–1.2)
CO2: 25 mmol/L (ref 20–29)
Calcium: 10.7 mg/dL — ABNORMAL HIGH (ref 8.7–10.3)
Chloride: 98 mmol/L (ref 96–106)
Creatinine, Ser: 1.39 mg/dL — ABNORMAL HIGH (ref 0.57–1.00)
Globulin, Total: 2.8 g/dL (ref 1.5–4.5)
Glucose: 88 mg/dL (ref 70–99)
Potassium: 4.1 mmol/L (ref 3.5–5.2)
Sodium: 137 mmol/L (ref 134–144)
Total Protein: 7.2 g/dL (ref 6.0–8.5)
eGFR: 41 mL/min/{1.73_m2} — ABNORMAL LOW (ref >59)

## 2022-02-03 LAB — VITAMIN B12: Vitamin B-12: >2000 pg/mL — ABNORMAL HIGH (ref 232–1245)

## 2022-02-03 LAB — LIPID PANEL
Chol/HDL Ratio: 2.3 ratio (ref 0.0–4.4)
Cholesterol, Total: 114 mg/dL (ref 100–199)
HDL: 50 mg/dL (ref >39)
LDL Chol Calc (NIH): 45 mg/dL (ref 0–99)
Triglycerides: 102 mg/dL (ref 0–149)
VLDL Cholesterol Cal: 19 mg/dL (ref 5–40)

## 2022-02-03 LAB — CBC WITH DIFFERENTIAL/PLATELET
Basophils Absolute: 0.1 10*3/uL (ref 0.0–0.2)
Basos: 1 %
EOS (ABSOLUTE): 0.5 10*3/uL — ABNORMAL HIGH (ref 0.0–0.4)
Eos: 8 %
Hematocrit: 30.2 % — ABNORMAL LOW (ref 34.0–46.6)
Hemoglobin: 8.5 g/dL — CL (ref 11.1–15.9)
Immature Grans (Abs): 0 10*3/uL (ref 0.0–0.1)
Immature Granulocytes: 0 %
Lymphocytes Absolute: 1.7 10*3/uL (ref 0.7–3.1)
Lymphs: 25 %
MCH: 20.1 pg — ABNORMAL LOW (ref 26.6–33.0)
MCHC: 28.1 g/dL — ABNORMAL LOW (ref 31.5–35.7)
MCV: 72 fL — ABNORMAL LOW (ref 79–97)
Monocytes Absolute: 0.5 10*3/uL (ref 0.1–0.9)
Monocytes: 8 %
Neutrophils Absolute: 3.8 10*3/uL (ref 1.4–7.0)
Neutrophils: 58 %
Platelets: 395 10*3/uL (ref 150–450)
RBC: 4.22 x10E6/uL (ref 3.77–5.28)
RDW: 15.3 % (ref 11.7–15.4)
WBC: 6.7 10*3/uL (ref 3.4–10.8)

## 2022-02-03 LAB — VITAMIN D 25 HYDROXY (VIT D DEFICIENCY, FRACTURES): Vit D, 25-Hydroxy: 39.3 ng/mL (ref 30.0–100.0)

## 2022-02-03 LAB — TSH+FREE T4
Free T4: 1.73 ng/dL (ref 0.82–1.77)
TSH: 0.041 u[IU]/mL — ABNORMAL LOW (ref 0.450–4.500)

## 2022-02-03 NOTE — Telephone Encounter (Signed)
Patient Advocate Encounter   Received notification from [employer] that prior authorization is required for Clorazepate Dipotassium 3.'75MG'$  tablets. PA submitted and APPROVED on 02/03/2022.  Key [id number] Effective: 03/07/2021 - 03/05/2022

## 2022-02-03 NOTE — Telephone Encounter (Signed)
Attempted to call pt to inform no answer

## 2022-02-07 ENCOUNTER — Other Ambulatory Visit: Payer: Self-pay | Admitting: Nurse Practitioner

## 2022-02-07 DIAGNOSIS — Z1231 Encounter for screening mammogram for malignant neoplasm of breast: Secondary | ICD-10-CM

## 2022-02-09 ENCOUNTER — Ambulatory Visit
Admission: RE | Admit: 2022-02-09 | Discharge: 2022-02-09 | Disposition: A | Discharge status: Home / Self Care | Payer: Self-pay | Source: Ambulatory Visit | Attending: Family Medicine | Admitting: Family Medicine

## 2022-02-09 ENCOUNTER — Other Ambulatory Visit: Payer: Self-pay | Admitting: Nurse Practitioner

## 2022-02-09 DIAGNOSIS — N63 Unspecified lump in unspecified breast: Secondary | ICD-10-CM

## 2022-02-09 DIAGNOSIS — Z1231 Encounter for screening mammogram for malignant neoplasm of breast: Secondary | ICD-10-CM

## 2022-02-09 LAB — IRON AND TIBC
Iron Saturation: 3 % — CL (ref 15–55)
Iron: 16 ug/dL — ABNORMAL LOW (ref 27–139)
Total Iron Binding Capacity: 487 ug/dL — ABNORMAL HIGH (ref 250–450)
UIBC: 471 ug/dL — ABNORMAL HIGH (ref 118–369)

## 2022-02-09 LAB — SPECIMEN STATUS REPORT

## 2022-02-09 LAB — FERRITIN: Ferritin: 9 ng/mL — ABNORMAL LOW (ref 15–150)

## 2022-02-10 ENCOUNTER — Encounter: Payer: Self-pay | Admitting: Family Medicine

## 2022-02-11 ENCOUNTER — Other Ambulatory Visit: Payer: Self-pay | Admitting: Nurse Practitioner

## 2022-02-11 ENCOUNTER — Ambulatory Visit (HOSPITAL_COMMUNITY)
Admission: RE | Admit: 2022-02-11 | Discharge: 2022-02-11 | Disposition: A | Discharge status: Home / Self Care | Payer: Medicare HMO | Source: Ambulatory Visit | Attending: Family Medicine | Admitting: Family Medicine

## 2022-02-11 DIAGNOSIS — R1011 Right upper quadrant pain: Secondary | ICD-10-CM | POA: Insufficient documentation

## 2022-02-11 DIAGNOSIS — R198 Other specified symptoms and signs involving the digestive system and abdomen: Secondary | ICD-10-CM | POA: Diagnosis not present

## 2022-02-11 DIAGNOSIS — R1084 Generalized abdominal pain: Secondary | ICD-10-CM

## 2022-02-11 DIAGNOSIS — K8020 Calculus of gallbladder without cholecystitis without obstruction: Secondary | ICD-10-CM | POA: Diagnosis not present

## 2022-02-11 MED ORDER — FERROUS SULFATE 325 (65 FE) MG PO TBEC: 325.0000 mg | DELAYED_RELEASE_TABLET | Freq: Every day | ORAL | 3 refills | Status: DC

## 2022-02-12 ENCOUNTER — Other Ambulatory Visit: Payer: Self-pay | Admitting: Nurse Practitioner

## 2022-02-14 ENCOUNTER — Encounter: Payer: Self-pay | Admitting: Family Medicine

## 2022-02-15 ENCOUNTER — Ambulatory Visit
Admission: RE | Admit: 2022-02-15 | Discharge: 2022-02-15 | Disposition: A | Discharge status: Home / Self Care | Payer: Self-pay | Source: Ambulatory Visit | Attending: Nurse Practitioner | Admitting: Nurse Practitioner

## 2022-02-15 ENCOUNTER — Ambulatory Visit
Admission: RE | Admit: 2022-02-15 | Discharge: 2022-02-15 | Disposition: A | Discharge status: Home / Self Care | Payer: Medicare HMO | Source: Ambulatory Visit | Attending: Nurse Practitioner | Admitting: Nurse Practitioner

## 2022-02-15 DIAGNOSIS — R928 Other abnormal and inconclusive findings on diagnostic imaging of breast: Secondary | ICD-10-CM | POA: Diagnosis not present

## 2022-02-15 DIAGNOSIS — N63 Unspecified lump in unspecified breast: Secondary | ICD-10-CM

## 2022-02-15 DIAGNOSIS — N6489 Other specified disorders of breast: Secondary | ICD-10-CM | POA: Diagnosis not present

## 2022-02-16 ENCOUNTER — Other Ambulatory Visit: Payer: Self-pay | Admitting: Family Medicine

## 2022-02-16 DIAGNOSIS — K8010 Calculus of gallbladder with chronic cholecystitis without obstruction: Secondary | ICD-10-CM

## 2022-03-07 ENCOUNTER — Other Ambulatory Visit: Payer: Self-pay | Admitting: Family Medicine

## 2022-03-07 DIAGNOSIS — E039 Hypothyroidism, unspecified: Secondary | ICD-10-CM

## 2022-03-21 ENCOUNTER — Other Ambulatory Visit: Payer: Self-pay | Admitting: Family Medicine

## 2022-03-21 DIAGNOSIS — K219 Gastro-esophageal reflux disease without esophagitis: Secondary | ICD-10-CM

## 2022-03-21 DIAGNOSIS — E785 Hyperlipidemia, unspecified: Secondary | ICD-10-CM

## 2022-03-21 DIAGNOSIS — E039 Hypothyroidism, unspecified: Secondary | ICD-10-CM

## 2022-03-21 DIAGNOSIS — F418 Other specified anxiety disorders: Secondary | ICD-10-CM

## 2022-03-21 DIAGNOSIS — I10 Essential (primary) hypertension: Secondary | ICD-10-CM

## 2022-03-29 ENCOUNTER — Other Ambulatory Visit: Payer: Self-pay | Admitting: Family Medicine

## 2022-03-29 DIAGNOSIS — K219 Gastro-esophageal reflux disease without esophagitis: Secondary | ICD-10-CM

## 2022-04-26 ENCOUNTER — Encounter (INDEPENDENT_AMBULATORY_CARE_PROVIDER_SITE_OTHER): Payer: Self-pay | Admitting: *Deleted

## 2022-05-13 ENCOUNTER — Encounter: Payer: Self-pay | Admitting: Nurse Practitioner

## 2022-05-13 ENCOUNTER — Ambulatory Visit (INDEPENDENT_AMBULATORY_CARE_PROVIDER_SITE_OTHER): Payer: Medicare HMO | Admitting: Nurse Practitioner

## 2022-05-13 VITALS — BP 143/94 | HR 84 | Temp 97.8°F | Resp 20 | Ht 64.0 in | Wt 195.0 lb

## 2022-05-13 DIAGNOSIS — R399 Unspecified symptoms and signs involving the genitourinary system: Secondary | ICD-10-CM

## 2022-05-13 DIAGNOSIS — R1084 Generalized abdominal pain: Secondary | ICD-10-CM | POA: Diagnosis not present

## 2022-05-13 DIAGNOSIS — K819 Cholecystitis, unspecified: Secondary | ICD-10-CM | POA: Diagnosis not present

## 2022-05-13 LAB — URINALYSIS, COMPLETE
Bilirubin, UA: NEGATIVE
Glucose, UA: NEGATIVE
Ketones, UA: NEGATIVE
Nitrite, UA: NEGATIVE
Protein,UA: NEGATIVE
RBC, UA: NEGATIVE
Specific Gravity, UA: 1.02 (ref 1.005–1.030)
Urobilinogen, Ur: 0.2 mg/dL (ref 0.2–1.0)
pH, UA: 6 (ref 5.0–7.5)

## 2022-05-13 LAB — MICROSCOPIC EXAMINATION
RBC, Urine: NONE SEEN /hpf (ref 0–2)
Renal Epithel, UA: NONE SEEN /hpf

## 2022-05-13 MED ORDER — CEPHALEXIN 500 MG PO CAPS: 500.0000 mg | ORAL_CAPSULE | Freq: Two times a day (BID) | ORAL | 0 refills | Status: DC

## 2022-05-13 NOTE — Progress Notes (Signed)
Subjective:    Patient ID: Leslie Robbins, female    DOB: March 20, 1955, 67 y.o.   MRN: [id number]   Chief Complaint: uti or gall bladder issues  HPI Patient comes in with 2 complaints: - gall bladder- patient had u/s 02/11/22 and was showed to have cholecystitis. We did referral to surgeon but she never got an appointment. - UTI- occasional urgency and frequency. Drinking soft drinks makes it worse. Has been getting up several times at night to void. Patient Active Problem List   Diagnosis Date Noted   Chronic idiopathic constipation 03/30/2021   Sinusitis chronic, frontal 01/13/2021   Dysuria 01/13/2021   History of total knee replacement, right 05/09/2017   Pain management contract signed 02/06/2017   Degenerative arthritis of right shoulder region 06/18/2016   S/P shoulder replacement, right 06/17/2016   Bursitis, hip [id number]   Lichen sclerosus of female genitalia 12/30/2013   OA (osteoarthritis) of knee 09/16/2013   GERD (gastroesophageal reflux disease) 07/11/2013   GAD (generalized anxiety disorder) 07/11/2013   Depression with anxiety 07/11/2013   Hyperlipidemia with target LDL less than 100 07/11/2013   Hypothyroidism 07/11/2013   Essential hypertension 12/19/2008       Review of Systems  Gastrointestinal:  Positive for nausea and vomiting (occasional).  Genitourinary:  Positive for frequency and urgency. Negative for dysuria and pelvic pain.       Objective:   Physical Exam Vitals reviewed.  Constitutional:      Appearance: Normal appearance.  Cardiovascular:     Rate and Rhythm: Normal rate and regular rhythm.     Heart sounds: Normal heart sounds.  Pulmonary:     Effort: Pulmonary effort is normal.     Breath sounds: Normal breath sounds.  Skin:    General: Skin is warm.  Neurological:     General: No focal deficit present.     Mental Status: She is alert and oriented to person, place, and time.  Psychiatric:        Mood and Affect: Mood normal.         Behavior: Behavior normal.    BP (!) 143/94   Pulse 84   Temp 97.8 F (36.6 C) (Temporal)   Resp 20   Ht '5\' 4"'$  (1.626 m)   Wt 195 lb (88.5 kg)   SpO2 98%   BMI 33.47 kg/m    U/A dip- leuks     Assessment & Plan:  Leslie Robbins in today with chief complaint of Abdominal Pain (Had gallbladder checked last year and they said it was diseased)   1. Generalized abdominal pain - Urinalysis, Complete - Urine Culture  2. Cholecystitis Avoid spicy and fatty foods - Ambulatory referral to General Surgery  3. Urinary tract infection symptoms Take medication as prescribe Cotton underwear Take shower not bath Cranberry juice, yogurt Force fluids AZO over the counter X2 days Culture pending RTO prn  Meds ordered this encounter  Medications   cephALEXin (KEFLEX) 500 MG capsule    Sig: Take 1 capsule (500 mg total) by mouth 2 (two) times daily.    Dispense:  14 capsule    Refill:  0    Order Specific Question:   Supervising Provider    Answer:   Caryl Pina A [id number]       The above assessment and management plan was discussed with the patient. The patient verbalized understanding of and has agreed to the management plan. Patient is aware to call the clinic if symptoms persist  or worsen. Patient is aware when to return to the clinic for a follow-up visit. Patient educated on when it is appropriate to go to the emergency department.   [place]ack[place]-Margaret Hassell Done, FNP

## 2022-05-13 NOTE — Patient Instructions (Signed)
Metamucil daily and Senna S twice a day.    Take Mag Citrate as needed.    Cholecystitis Cholecystitis is irritation and swelling (inflammation) of the gallbladder. The gallbladder: Is an organ that is shaped like a pear. Is under the liver on the right side of the body. Stores bile. Bile helps the body break down (digest) the fats in food. This condition can occur all of a sudden. It needs to be treated. What are the causes? This condition may be caused by stones or lumps that form in the gallbladder (gallstones). Gallstones can block the tube (duct) that carries bile out of your gallbladder. Other causes include: Damage to the gallbladder due to less blood flow. Germs in the bile ducts. Scars, kinks, or adhesions in the bile ducts. Abnormal growths (tumors) in the liver, pancreas, or gallbladder. What increases the risk? You are more likely to develop this condition if: You are female and between the ages of 55-62. You take birth control pills. You use estrogen. You take certain medicines that make you more likely to develop gallstones. You are overweight (obese). You have a very bad reaction to an infection (sepsis). You have been hospitalized due to a serious condition, such as a burn or illness. You have not eaten or drank for a long time. What are the signs or symptoms? Symptoms of this condition include: Pain in the upper right part of the belly (abdomen). A lump over the gallbladder. Bloating in the belly. Feeling sick to your stomach (nauseous). Vomiting. Fever. Chills. How is this treated? This condition may be treated with: Medicines to treat pain. Giving fluids through an IV tube. Not eating or drinking (fasting). Antibiotic medicines. Surgery to take out your gallbladder. Gallbladder drainage. Follow these instructions at home: Medicines  Take over-the-counter and prescription medicines only as told by your doctor. If you were prescribed an antibiotic  medicine, take it as told by your doctor. Do not stop taking it even if you start to feel better. General instructions Follow instructions from your doctor about what to eat or drink. Do not eat or drink anything that makes you sick again. Do not smoke or use any products that contain nicotine or tobacco. If you need help quitting, ask your doctor. Keep all follow-up visits. Contact a doctor if: You have pain and your medicine does not help. You have a fever. Get help right away if: Your pain moves to: Another part of your belly. Your back. Your symptoms do not go away. You have new symptoms. These symptoms may be an emergency. Get help right away. Call 911. Do not wait to see if the symptoms will go away. Do not drive yourself to the hospital. Summary This condition may be caused by stones or lumps that form in the gallbladder (gallstones). A common symptom is pain in your belly. This condition may be treated with surgery to take out your gallbladder. Follow instructions from your doctor about what to eat or drink. This information is not intended to replace advice given to you by your health care provider. Make sure you discuss any questions you have with your health care provider. Document Revised: 08/25/2020 Document Reviewed: 08/25/2020 Elsevier Patient Education  2023 [employer]  

## 2022-05-15 LAB — URINE CULTURE

## 2022-05-16 ENCOUNTER — Ambulatory Visit: Payer: Medicare HMO

## 2022-05-24 ENCOUNTER — Encounter: Payer: Self-pay | Admitting: General Surgery

## 2022-05-24 ENCOUNTER — Ambulatory Visit: Payer: Medicare HMO | Admitting: General Surgery

## 2022-05-24 VITALS — BP 128/84 | HR 81 | Temp 98.2°F | Resp 16 | Ht 64.0 in | Wt 193.0 lb

## 2022-05-24 DIAGNOSIS — R195 Other fecal abnormalities: Secondary | ICD-10-CM

## 2022-05-24 DIAGNOSIS — D649 Anemia, unspecified: Secondary | ICD-10-CM | POA: Diagnosis not present

## 2022-05-24 DIAGNOSIS — R1031 Right lower quadrant pain: Secondary | ICD-10-CM | POA: Insufficient documentation

## 2022-05-24 DIAGNOSIS — K8020 Calculus of gallbladder without cholecystitis without obstruction: Secondary | ICD-10-CM

## 2022-05-24 NOTE — Progress Notes (Unsigned)
[facility] History and Physical  Reason for Referral:*** Referring Physician: ***  Chief Complaint   New Patient (Initial Visit)     Leslie Robbins is a 67 y.o. female.  HPI: ***.  The *** started *** and has had a duration of ***.  It is associated with ***.  The *** is improved with ***, and is made worse with ***.    Quality*** Context***  Past Medical History:  Diagnosis Date   Anxiety    Arthritis    Depression    GERD (gastroesophageal reflux disease)    History of hiatal hernia    Hypercholesteremia    Hypertension    Hypothyroidism     Past Surgical History:  Procedure Laterality Date   ABDOMINAL HYSTERECTOMY     BREAST LUMPECTOMY     benign   CARDIAC CATHETERIZATION     9 YRS AGO   COLONOSCOPY N/A 05/30/2012   Procedure: COLONOSCOPY;  Surgeon: Rogene Houston, MD;  [facility];  Service: Endoscopy;  Laterality: N/A;  830-moved to 1125 Ann to notify pt   DILATION AND CURETTAGE OF UTERUS     X2   JOINT REPLACEMENT Right    right hip   Right Carpal Tunnel Release     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   Procedure: LEFT TOTAL KNEE ARTHROPLASTY;  Surgeon: Gearlean Alf, MD;  Location: [facility];  Service: Orthopedics;  Laterality: Left;   TOTAL SHOULDER ARTHROPLASTY Right 06/17/2016   TOTAL SHOULDER ARTHROPLASTY Right 06/17/2016   Procedure: RIGHT TOTAL SHOULDER ARTHROPLASTY;  Surgeon: Netta Cedars, MD;  Location: [facility];  Service: Orthopedics;  Laterality: Right;   TUBAL LIGATION      Family History  Problem Relation Age of Onset   Dementia Mother    Hypertension Mother    Lupus Mother    Fibromyalgia Sister    Depression Sister    Bipolar disorder Daughter    Drug abuse Daughter    Breast cancer Maternal Aunt    Breast cancer Cousin    Breast cancer Cousin    Colon cancer Neg Hx     Social History   Tobacco Use   Smoking status: Never   Smokeless tobacco: Never  Vaping Use   Vaping Use: Never used  Substance  Use Topics   Alcohol use: No   Drug use: No    Medications: {medication reviewed/display:3041432} Allergies as of 05/24/2022       Reactions   Sulfa Antibiotics Rash   Seroquel [quetiapine] Other (See Comments)   Seroquel anger, hostility, increased anxiety   Sulfonamide Derivatives Rash   SULFONAMIDES INCREASE SENSITIVITY   TO SUNLIGHT > MORE LIKELY TO SUNBURN SUNBURN-LIKE REDNESS        Medication List        Accurate as of May 24, 2022 11:33 AM. If you have any questions, ask your nurse or doctor.          STOP taking these medications    cephALEXin 500 MG capsule Commonly known as: Keflex Stopped by: Virl Cagey, MD   moxifloxacin 400 MG tablet Commonly known as: AVELOX Stopped by: Virl Cagey, MD       TAKE these medications    albuterol 108 (90 Base) MCG/ACT inhaler Commonly known as: VENTOLIN HFA INHALE 2 PUFFS BY MOUTH EVERY 6 HOURS AS NEEDED FOR WHEEZE OR SHORTNESS OF BREATH   amLODipine 5 MG tablet Commonly known as: NORVASC TAKE 1 TABLET (5 MG TOTAL) BY  MOUTH DAILY.   amoxicillin 500 MG capsule Commonly known as: AMOXIL Take 500 mg by mouth daily. Prior to dental procedures   aspirin EC 81 MG tablet Take 81 mg by mouth daily. WILL STOP PRIOR TO PROCEDURE   cetirizine 10 MG tablet Commonly known as: ZYRTEC Take 10 mg by mouth daily as needed for allergies.   Clinpro 5000 1.1 % Pste Generic drug: Sodium Fluoride Place onto teeth at bedtime.   clorazepate 3.75 MG tablet Commonly known as: TRANXENE Take 1 tablet (3.75 mg total) by mouth at bedtime as needed for anxiety.   DULoxetine 60 MG capsule Commonly known as: CYMBALTA TAKE 1 CAPSULE BY MOUTH 2 TIMES DAILY.   estradiol 0.1 MG/GM vaginal cream Commonly known as: ESTRACE PLACE 2 GRAMS VAGINALLY AS DIRECTED EVERY OTHER NIGHT   ferrous sulfate 325 (65 FE) MG EC tablet Commonly known as: Fe Tabs Take 1 tablet (325 mg total) by mouth daily with breakfast.    fexofenadine 180 MG tablet Commonly known as: ALLEGRA Take 1 tablet (180 mg total) by mouth daily. For allergy symptoms   fluticasone 50 MCG/ACT nasal spray Commonly known as: FLONASE Place 2 sprays into both nostrils daily.   levothyroxine 125 MCG tablet Commonly known as: SYNTHROID TAKE 1 TABLET BY MOUTH EVERY DAY   meloxicam 15 MG tablet Commonly known as: MOBIC Take 1 tablet (15 mg total) by mouth daily.   omeprazole 40 MG capsule Commonly known as: PRILOSEC TAKE 1 CAPSULE (40 MG TOTAL) BY MOUTH DAILY.   polyethylene glycol powder 17 GM/SCOOP powder Commonly known as: GLYCOLAX/MIRALAX Take 17 g by mouth 2 (two) times daily as needed for moderate constipation. For constipation   rosuvastatin 20 MG tablet Commonly known as: CRESTOR TAKE 1 TABLET BY MOUTH EVERY DAY   triamterene-hydrochlorothiazide 37.5-25 MG tablet Commonly known as: MAXZIDE-25 TAKE 1 TABLET BY MOUTH DAILY. AS NEEDED FOR SWELLING         ROS:  {Review of Systems:30496}  Blood pressure 128/84, pulse 81, temperature 98.2 F (36.8 C), temperature source Oral, resp. rate 16, height 5\' 4"  (1.626 m), weight 193 lb (87.5 kg), SpO2 96 %. Physical Exam  Results: CLINICAL DATA:  diffuse pain   EXAM: ABDOMEN ULTRASOUND COMPLETE   COMPARISON:  None Available.   FINDINGS: Gallbladder: Cholelithiasis. A sonographic Percell Miller sign is reported by sonographer. The gallbladder is distended. An no pericholecystic fluid. Gallbladder wall thickness is within normal limits.   Common bile duct: Diameter: Visualized portion measures 4 mm, within normal limits   Liver: No focal lesion identified. Heterogeneous and increased parenchymal echogenicity. Portal vein is patent on color Doppler imaging with normal direction of blood flow towards the liver.   IVC: No abnormality visualized.   Pancreas: Visualized portion unremarkable.   Spleen: Size and appearance within normal limits.   Right Kidney: Length:  9.1 cm. Echogenicity within normal limits. No mass or hydronephrosis visualized.   Left Kidney: Length: 9.8 cm. Echogenicity within normal limits. No mass or hydronephrosis visualized.   Abdominal aorta: No aneurysm visualized.   Other findings: None.   IMPRESSION: 1. There is cholelithiasis with a sonographic Murphy sign reported by the sonographer. No additional ancillary evidence of acute cholecystitis. Findings may reflect chronic cholecystitis versus early acute cholecystitis. Recommend correlation with clinical symptomatology. Dedicated HIDA scan could be of further assistance for improved evaluation. 2. Heterogeneous and increased parenchymal echogenicity of the liver. Findings are nonspecific but may reflect hepatic steatosis.   These results will be called to the  ordering clinician or representative by the Radiologist Assistant, and communication documented in the PACS or [employer].     Electronically Signed   By: Valentino Saxon M.D.   On: 02/11/2022 16:37  Assessment & Plan:  JAGGER WOTEN is a 66 y.o. female with *** -*** -*** -Follow up ***  All questions were answered to the satisfaction of the patient and family***.  The risk and benefits of *** were discussed including but not limited to ***.  After careful consideration, EREN RICCIUTI has decided to ***.    Virl Cagey 05/24/2022, 11:33 AM

## 2022-05-24 NOTE — Patient Instructions (Signed)
Will repeat labs due to your anemia and dark stools. Will try to get a stool sample too to see if there is any blood in the stool. CT has been ordered. Call GI to get your colonoscopy scheduled.  Will call you with the results.

## 2022-05-25 DIAGNOSIS — R195 Other fecal abnormalities: Secondary | ICD-10-CM | POA: Diagnosis not present

## 2022-05-25 DIAGNOSIS — D649 Anemia, unspecified: Secondary | ICD-10-CM | POA: Diagnosis not present

## 2022-05-25 LAB — CBC WITH DIFFERENTIAL
Basophils Absolute: 0 10*3/uL (ref 0.0–0.2)
Basos: 1 %
EOS (ABSOLUTE): 0.3 10*3/uL (ref 0.0–0.4)
Eos: 6 %
Hematocrit: 43.7 % (ref 34.0–46.6)
Hemoglobin: 13.8 g/dL (ref 11.1–15.9)
Immature Grans (Abs): 0 10*3/uL (ref 0.0–0.1)
Immature Granulocytes: 0 %
Lymphocytes Absolute: 0.8 10*3/uL (ref 0.7–3.1)
Lymphs: 14 %
MCH: 25 pg — ABNORMAL LOW (ref 26.6–33.0)
MCHC: 31.6 g/dL (ref 31.5–35.7)
MCV: 79 fL (ref 79–97)
Monocytes Absolute: 0.5 10*3/uL (ref 0.1–0.9)
Monocytes: 8 %
Neutrophils Absolute: 4.2 10*3/uL (ref 1.4–7.0)
Neutrophils: 71 %
RBC: 5.53 x10E6/uL — ABNORMAL HIGH (ref 3.77–5.28)
RDW: 16.4 % — ABNORMAL HIGH (ref 11.7–15.4)
WBC: 5.8 10*3/uL (ref 3.4–10.8)

## 2022-05-25 LAB — COMPREHENSIVE METABOLIC PANEL
ALT: 26 IU/L (ref 0–32)
AST: 23 IU/L (ref 0–40)
Albumin/Globulin Ratio: 1.5 (ref 1.2–2.2)
Albumin: 4.3 g/dL (ref 3.9–4.9)
Alkaline Phosphatase: 195 IU/L — ABNORMAL HIGH (ref 44–121)
BUN/Creatinine Ratio: 18 (ref 12–28)
BUN: 25 mg/dL (ref 8–27)
Bilirubin Total: 0.5 mg/dL (ref 0.0–1.2)
CO2: 24 mmol/L (ref 20–29)
Calcium: 10.7 mg/dL — ABNORMAL HIGH (ref 8.7–10.3)
Chloride: 103 mmol/L (ref 96–106)
Creatinine, Ser: 1.4 mg/dL — ABNORMAL HIGH (ref 0.57–1.00)
Globulin, Total: 2.9 g/dL (ref 1.5–4.5)
Glucose: 89 mg/dL (ref 70–99)
Potassium: 4.1 mmol/L (ref 3.5–5.2)
Sodium: 143 mmol/L (ref 134–144)
Total Protein: 7.2 g/dL (ref 6.0–8.5)
eGFR: 41 mL/min/{1.73_m2} — ABNORMAL LOW (ref >59)

## 2022-05-25 NOTE — Progress Notes (Signed)
Can you let her know that her Hgb is much better so that is very reassuring. Lets still get the CT to ensure nothing else causing her such extensive pain all along that right lateral side, and after  CT I will call her to discuss the options.

## 2022-05-31 NOTE — Progress Notes (Signed)
Primary Care Physician:  Claretta Fraise, MD Primary Gastroenterologist:  Dr. Laural Golden establishing with Dr. Jenetta Downer  Chief Complaint  Patient presents with   Abdominal Pain    Upper abdominal pain on left side. Time for a colonoscopy. Constipation a lot, takes MiraLax.      HPI:   Leslie Robbins is a 67 y.o. female with history of anxiety, depression, HTN, HLD, hypothyroidism, GERD, adenomatous colon polyps, currently due for surveillance colonoscopy, presenting today for abdominal pain and constipation.   Office visit with PCP 05/13/2022.  Patient had generalized abdominal pain.  Ultrasound completed in December 2023 with cholelithiasis with positive Murphy sign, hepatic steatosis.  She was referred to general surgery.  Office visit with Dr. Constance Haw 05/24/2022.  Patient reported abdominal pain, but denied any association with greasy or fatty foods.  She had trouble with constipation.  She had also previously been found to have anemia in November 2023 and was taking iron.  On exam, patient had pain over the entire right abdomen, right upper to lower, very tender to palpation.  Plan to update labs, try to get stool sample to evaluate for blood, arrange CT, and recommended follow-up with GI to arrange colonoscopy in light of anemia, pain, and dark stools.  Labs showed hemoglobin of 13.8, MCV low at 25, alk phos elevated at 195, calcium elevated at 10.7 (stable), creatinine 1.4 (stable). FOBT negative.   CT A/P with contrast completed 06/01/2022. Final read not yet available.   Today:  Intermittent abdominal pain in the right upper quadrant, left upper quadrant, or in the lower abdomen.  Had severe RUQ abdominal pain over the weekend.  She took Tums and symptoms improved.  RUQ pain has been occurring a couple times a week for the last 4 weeks or so.  Pain can be worse after greasy foods, but sometimes not.  LUQ and lower abdominal pain occur at random.  She does note that her abdominal pain is worse  when she is not moving her bowels.  The pain in her lower abdomen is more like a pressure like she needs to have a bowel movement, but cannot.  Takes MiraLAX as needed if she gets too constipated.  Typically has bowel movements every other day to every 3 to 4 days.  Constipation is chronic.  Stools are hard unless she takes MiraLAX.  Previously tried Linzess, but this caused abdominal pain.  Reports having hemorrhoids.  States 1 burst a few months ago and bled, but no recurrent bleeding since then.  She had some dark stool a couple weeks ago.  Had been taking iron daily, but has now decreased to 3 times a week after her recent blood work showed a normal hemoglobin.  I do note that patient had anemia with hemoglobin down to 8.5 in November 2023 as well as iron deficiency at that time.  Takes meloxicam daily for OA. Also takes 81 mg aspirin.   Colonoscopy 05/30/2012 with 2 small polyps in the cecum and ascending colon removed, diverticula in the sigmoid colon, small external hemorrhoids.  Pathology with tubular adenoma.  Recommended 10-year repeat.   Past Medical History:  Diagnosis Date   Anxiety    Arthritis    Depression    GERD (gastroesophageal reflux disease)    History of hiatal hernia    Hypercholesteremia    Hypertension    Hypothyroidism     Past Surgical History:  Procedure Laterality Date   ABDOMINAL HYSTERECTOMY     BREAST LUMPECTOMY  benign   CARDIAC CATHETERIZATION     9 YRS AGO   COLONOSCOPY N/A 05/30/2012   Procedure: COLONOSCOPY;  Surgeon: Rogene Houston, MD;  [facility];  Service: Endoscopy;  Laterality: N/A;  830-moved to 1125 Ann to notify pt   DILATION AND CURETTAGE OF UTERUS     X2   JOINT REPLACEMENT Right    right hip   Right Carpal Tunnel Release     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   Procedure: LEFT TOTAL KNEE ARTHROPLASTY;  Surgeon: Gearlean Alf, MD;  [facility];  Service: Orthopedics;  Laterality: Left;   TOTAL SHOULDER  ARTHROPLASTY Right 06/17/2016   TOTAL SHOULDER ARTHROPLASTY Right 06/17/2016   Procedure: RIGHT TOTAL SHOULDER ARTHROPLASTY;  Surgeon: Netta Cedars, MD;  Location: [facility];  Service: Orthopedics;  Laterality: Right;   TUBAL LIGATION      Current Outpatient Medications  Medication Sig Dispense Refill   albuterol (VENTOLIN HFA) 108 (90 Base) MCG/ACT inhaler INHALE 2 PUFFS BY MOUTH EVERY 6 HOURS AS NEEDED FOR WHEEZE OR SHORTNESS OF BREATH 8.5 each 2   amLODipine (NORVASC) 5 MG tablet TAKE 1 TABLET (5 MG TOTAL) BY MOUTH DAILY. 90 tablet 0   aspirin EC 81 MG tablet Take 81 mg by mouth daily. WILL STOP PRIOR TO PROCEDURE     clorazepate (TRANXENE) 3.75 MG tablet Take 1 tablet (3.75 mg total) by mouth at bedtime as needed for anxiety. 30 tablet 5   DULoxetine (CYMBALTA) 60 MG capsule TAKE 1 CAPSULE BY MOUTH 2 TIMES DAILY. 180 capsule 0   estradiol (ESTRACE) 0.1 MG/GM vaginal cream PLACE 2 GRAMS VAGINALLY AS DIRECTED EVERY OTHER NIGHT 42.5 g 12   ferrous sulfate (FE TABS) 325 (65 FE) MG EC tablet Take 1 tablet (325 mg total) by mouth daily with breakfast. (Patient taking differently: Take 325 mg by mouth daily with breakfast. 3 times a week) 90 tablet 3   fexofenadine (ALLEGRA) 180 MG tablet Take 1 tablet (180 mg total) by mouth daily. For allergy symptoms 30 tablet 11   fluticasone (FLONASE) 50 MCG/ACT nasal spray Place 2 sprays into both nostrils daily. 16 g 6   levothyroxine (SYNTHROID) 125 MCG tablet TAKE 1 TABLET BY MOUTH EVERY DAY 90 tablet 0   meloxicam (MOBIC) 15 MG tablet Take 1 tablet (15 mg total) by mouth daily. 90 tablet 3   omeprazole (PRILOSEC) 40 MG capsule TAKE 1 CAPSULE (40 MG TOTAL) BY MOUTH DAILY. 90 capsule 0   polyethylene glycol powder (GLYCOLAX/MIRALAX) 17 GM/SCOOP powder Take 17 g by mouth 2 (two) times daily as needed for moderate constipation. For constipation 3350 g 5   rosuvastatin (CRESTOR) 20 MG tablet TAKE 1 TABLET BY MOUTH EVERY DAY 90 tablet 0    triamterene-hydrochlorothiazide (MAXZIDE-25) 37.5-25 MG tablet TAKE 1 TABLET BY MOUTH DAILY. AS NEEDED FOR SWELLING 90 tablet 0   cetirizine (ZYRTEC) 10 MG tablet Take 10 mg by mouth daily as needed for allergies. (Patient not taking: Reported on 06/02/2022)     CLINPRO 5000 1.1 % PSTE Place onto teeth at bedtime. (Patient not taking: Reported on 06/02/2022)     No current facility-administered medications for this visit.    Allergies as of 06/02/2022 - Review Complete 06/02/2022  Allergen Reaction Noted   Sulfa antibiotics Rash 09/16/2021   Seroquel [quetiapine] Other (See Comments) 03/30/2021   Sulfonamide derivatives Rash     Family History  Problem Relation Age of Onset   Dementia Mother    Hypertension  Mother    Lupus Mother    Fibromyalgia Sister    Depression Sister    Bipolar disorder Daughter    Drug abuse Daughter    Breast cancer Maternal Aunt    Breast cancer Cousin    Breast cancer Cousin    Colon cancer Neg Hx     Social History   Socioeconomic History   Marital status: Married    Spouse name: Legrand Como   Number of children: 3   Years of education: Not on file   Highest education level: Not on file  Occupational History   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  Tobacco Use   Smoking status: Never   Smokeless tobacco: Never  Vaping Use   Vaping Use: Never used  Substance and Sexual Activity   Alcohol use: No   Drug use: No   Sexual activity: Yes    Birth control/protection: Surgical    Comment: hyst  Other Topics Concern   Not on file  Social History Narrative   3 grandchildren live with her and her husband full time - she has been their full time caregiver since 2019   One of her daughters lives nearby   Social Determinants of Health   Financial Resource Strain: [place]  (06/17/2021)   Overall Financial Resource Strain (CARDIA)    Difficulty of Paying Living Expenses: Not hard at all  Food Insecurity: No Food Insecurity (06/17/2021)   Hunger  Vital Sign    Worried About Running Out of Food in the Last Year: Never true    [place] in the Last Year: Never true  Transportation Needs: No Transportation Needs (06/17/2021)   PRAPARE - Hydrologist (Medical): No    Lack of Transportation (Non-Medical): No  Physical Activity: Insufficiently Active (06/17/2021)   Exercise Vital Sign    Days of Exercise per Week: 7 days    Minutes of Exercise per Session: 20 min  Stress: No Stress Concern Present (06/17/2021)   [place]    Feeling of Stress : Only a little  Social Connections: Socially Integrated (06/17/2021)   Social Connection and Isolation Panel [NHANES]    Frequency of Communication with Friends and Family: More than three times a week    Frequency of Social Gatherings with Friends and Family: More than three times a week    Attends Religious Services: More than 4 times per year    Active Member of [employer] or Organizations: Yes    Attends Music therapist: More than 4 times per year    Marital Status: Married  Human resources officer Violence: Not At Risk (06/17/2021)   Humiliation, Afraid, Rape, and Kick questionnaire    Fear of Current or Ex-Partner: No    Emotionally Abused: No    Physically Abused: No    Sexually Abused: No    Review of Systems: Gen: Denies any fever, chills, cold or flulike symptoms, presyncope, syncope. CV: Denies chest pain, heart palpitations. Resp: Denies shortness of breath, cough. GI: See HPI GU : Denies urinary burning, urinary frequency, urinary hesitancy MS: Denies joint pain. Derm: Denies rash. Psych: Denies depression, anxiety. Heme: See HPI  Physical Exam: BP 113/79 (BP Location: Right Arm, Patient Position: Sitting, Cuff Size: Normal)   Pulse 77   Temp (!) 97.5 F (36.4 C) (Temporal)   Ht 5\' 4"  (1.626 m)   Wt 193 lb 6.4 oz (87.7 kg)  SpO2 98%   BMI 33.20 kg/m  General:    Alert and oriented. Pleasant and cooperative. Well-nourished and well-developed.  Head:  Normocephalic and atraumatic. Eyes:  Without icterus, sclera clear and conjunctiva pink.  Ears:  Normal auditory acuity. Lungs:  Clear to auscultation bilaterally. No wheezes, rales, or rhonchi. No distress.  Heart:  S1, S2 present without murmurs appreciated.  Abdomen:  +BS, soft,  and non-distended.  Very mild TTP in the right upper quadrant.  No HSM noted. No guarding or rebound. No masses appreciated.  Rectal:  Deferred  Msk:  Symmetrical without gross deformities. Normal posture. Extremities:  Without edema. Neurologic:  Alert and  oriented x4;  grossly normal neurologically. Skin:  Intact without significant lesions or rashes. Psych: Normal mood and affect.    Assessment:  67 year old female with history of anxiety, depression, HTN, HLD, hypothyroidism, GERD, adenomatous colon polyps, currently due for surveillance colonoscopy, presenting today for abdominal pain and constipation.  Abdominal pain/constipation:  Intermittent abdominal pain in RUQ, LUQ regions, and lower abdomen.  RUQ abdominal pain symptoms affected by fatty meals, but not always.  She does note her abdominal pain is worse on the days she is not moving her bowels.  She also noted some improvement previously with taking Tums.  Chronic GERD is well-controlled on daily PPI.  She saw general surgery on 3/19 due to cholelithiasis and positive Percell Miller sign noted on ultrasound in December 2023.  She completed a CT scan yesterday ordered by general surgery with final result pending.  I also note that she was found to have iron deficiency anemia in November 2023 with recent hemoglobin improved to normal with oral iron supplementation.  Differential is broad and may be multifactorial including biliary colic, uncontrolled constipation, gastritis, duodenitis, PUD in the setting of chronic NSAID use.   We will follow-up on her CT scan to ensure she  has no acute abnormalities.  If CT is negative, will plan for EGD and colonoscopy in the near future.  Recommended MiraLAX daily for constipation as Linzess previously caused abdominal pain.  Also discussed NSAID avoidance.  GERD: Chronic.  Well-controlled on omeprazole 40 mg daily.  IDA:  Found to have iron deficiency anemia in November 2023 with hemoglobin down to 8.5.  She started oral iron daily with most recent hemoglobin 13.8 on 05/24/2022.  She has since started taking iron about 3 times a week.  FOBT was negative on 05/25/2022.  Though she does note having some rectal bleeding in the setting of hemorrhoids.  She also reports having some dark stool though this was in the setting of iron.  She is at risk for peptic ulcer disease in the setting of daily NSAID use.  She is also due for colonoscopy due to history of colon polyps.  Will plan to arrange EGD and colonoscopy in the near future pending final read on CT completed yesterday for abdominal pain.  Also discussed strict NSAID avoidance.  History of colon polyps: Due for surveillance colonoscopy.  Last colonoscopy in March 2014 with 2 small polyps in the cecum and ascending colon removed, diverticula in the sigmoid colon, small external hemorrhoids.  Pathology with tubular adenoma.  Recommended 10-year surveillance.   Plan:  Follow-up on CT A/P with contrast completed yesterday. If CT shows no acute abnormalities, will plan for EGD and colonoscopy with propofol with Dr. Jenetta Downer. The risks, benefits, and alternatives have been discussed with the patient in detail. The patient states understanding and desires to proceed.  ASA 3  Hold iron x 10 days prior to procedure. Continue omeprazole 40 mg daily. Minimize use of meloxicam is much as possible and avoid all other NSAIDs. Use Tylenol first for pain.  More than 3000 mg of Tylenol per 24 hours. Start MiraLAX 17 g daily.  Can increase to twice daily if needed.  Requested patient let me know if  she has persistent constipation.  Follow-up TBD, likely after procedures.    Aliene Altes, PA-C [facility] 06/02/2022   I have reviewed the note and agree with the APP's assessment as described in this progress note  Maylon Peppers, MD Gastroenterology and Hepatology [facility]

## 2022-06-01 ENCOUNTER — Encounter (HOSPITAL_BASED_OUTPATIENT_CLINIC_OR_DEPARTMENT_OTHER): Payer: Self-pay

## 2022-06-01 ENCOUNTER — Ambulatory Visit (HOSPITAL_BASED_OUTPATIENT_CLINIC_OR_DEPARTMENT_OTHER)
Admission: RE | Admit: 2022-06-01 | Discharge: 2022-06-01 | Disposition: A | Discharge status: Home / Self Care | Payer: Medicare HMO | Source: Ambulatory Visit | Attending: General Surgery | Admitting: General Surgery

## 2022-06-01 DIAGNOSIS — R109 Unspecified abdominal pain: Secondary | ICD-10-CM | POA: Diagnosis not present

## 2022-06-01 DIAGNOSIS — R1031 Right lower quadrant pain: Secondary | ICD-10-CM | POA: Insufficient documentation

## 2022-06-01 LAB — FECAL OCCULT BLOOD, IMMUNOCHEMICAL: Fecal Occult Bld: NEGATIVE

## 2022-06-01 MED ORDER — IOHEXOL 300 MG/ML  SOLN
100.0000 mL | Freq: Once | INTRAMUSCULAR | Status: AC | PRN
  Administered 2022-06-01: 65 mL via INTRAVENOUS

## 2022-06-02 ENCOUNTER — Encounter: Payer: Self-pay | Admitting: Gastroenterology

## 2022-06-02 ENCOUNTER — Ambulatory Visit: Payer: Medicare HMO | Admitting: Gastroenterology

## 2022-06-02 VITALS — BP 113/79 | HR 77 | Temp 97.5°F | Ht 64.0 in | Wt 193.4 lb

## 2022-06-02 DIAGNOSIS — K219 Gastro-esophageal reflux disease without esophagitis: Secondary | ICD-10-CM

## 2022-06-02 DIAGNOSIS — Z86010 Personal history of colonic polyps: Secondary | ICD-10-CM | POA: Diagnosis not present

## 2022-06-02 DIAGNOSIS — K5900 Constipation, unspecified: Secondary | ICD-10-CM | POA: Diagnosis not present

## 2022-06-02 DIAGNOSIS — D509 Iron deficiency anemia, unspecified: Secondary | ICD-10-CM

## 2022-06-02 DIAGNOSIS — R109 Unspecified abdominal pain: Secondary | ICD-10-CM

## 2022-06-02 NOTE — Patient Instructions (Signed)
I will follow-up on the CT scan that you had yesterday.   As long as her CT looks good with no acute findings, we will plan to arrange an upper endoscopy and a colonoscopy for you in the near future with Dr. Jenetta Downer.  Continue taking omeprazole 40 mg daily.  Follow a GERD diet:  Avoid fried, fatty, greasy, spicy, citrus foods. Avoid caffeine and carbonated beverages. Avoid chocolate. Try eating 4-6 small meals a day rather than 3 large meals. Do not eat within 3 hours of laying down. Prop head of bed up on wood or bricks to create a 6 inch incline.  Please try to stop taking meloxicam and avoid all other NSAID products including ibuprofen, Aleve, Advil, BC powders, Goody powders, and anything that says "NSAID" on the package.  Use Tylenol first for pain.  No more than 3000 mg of Tylenol per 24 hours.  For constipation, start MiraLAX 1 capful (17 g) daily in 8 ounces of water or other noncarbonated beverage of your choice.  If taking MiraLAX once daily does not allow you to have good productive bowel movements every day, you can increase MiraLAX to twice daily.  Please call in about 1-2 weeks to let me know if you continue to have trouble with constipation.  Follow-up date to be determined.  Will likely follow-up with you after your procedures.  It was very nice to meet you today!  Aliene Altes, PA-C [facility]

## 2022-06-02 NOTE — Progress Notes (Signed)
Called patient. Stool sample without blood, Hgb back to normal.  CT with large gallstone. Pain is better RUQ but she has worse pain at times.    PLAN: I counseled the patient about the indication, risks and benefits of robotic assisted laparoscopic cholecystectomy.  She understands there is a very small chance for bleeding, infection, injury to normal structures (including common bile duct), conversion to open surgery, persistent symptoms, evolution of postcholecystectomy diarrhea, need for secondary interventions, anesthesia reaction, cardiopulmonary issues and other risks not specifically detailed here. I described the expected recovery, the plan for follow-up and the restrictions during the recovery phase.  All questions were answered.  She wants to check with her insurance about the cost.  She is looking at about 06/29/22 for surgery.

## 2022-06-03 ENCOUNTER — Telehealth: Payer: Self-pay | Admitting: Gastroenterology

## 2022-06-03 ENCOUNTER — Encounter: Payer: Self-pay | Admitting: Gastroenterology

## 2022-06-03 DIAGNOSIS — D509 Iron deficiency anemia, unspecified: Secondary | ICD-10-CM | POA: Insufficient documentation

## 2022-06-03 DIAGNOSIS — Z860100 Personal history of colon polyps, unspecified: Secondary | ICD-10-CM | POA: Insufficient documentation

## 2022-06-03 DIAGNOSIS — K5900 Constipation, unspecified: Secondary | ICD-10-CM | POA: Insufficient documentation

## 2022-06-03 DIAGNOSIS — R109 Unspecified abdominal pain: Secondary | ICD-10-CM | POA: Insufficient documentation

## 2022-06-03 NOTE — Telephone Encounter (Signed)
Courtney:  Please let patient know that I reviewed her CT scan.  She does have a large gallstone though no acute inflammation of her gallbladder as Dr. Constance Haw is also discussed with her.  No other significant abnormalities. Looks like Dr. Constance Haw has offered cholecystectomy, possibly on 4/24.  I do recommend that she have an upper endoscopy and colonoscopy due to new onset iron deficiency anemia last year, history of colon polyps.  We can try to have this completed before her cholecystectomy if she is agreeable and we have availability with Dr. Jenetta Downer in that time frame.  Otherwise, we will need to wait 1 month after her gallbladder surgery.   Please let me know what she would like to do.

## 2022-06-07 ENCOUNTER — Encounter: Payer: Self-pay | Admitting: *Deleted

## 2022-06-07 NOTE — Telephone Encounter (Signed)
LMOM for pt to call office. Sent MyChart message also.

## 2022-06-08 NOTE — Telephone Encounter (Signed)
Patient sent mychart message requesting to hold off on scheduling procedures until after her gallbladder surgery.  She actually prefers to wait until June.  Advised that we can schedule her for follow-up in early to mid May to discuss arranging procedures.  Mandy: Please reach out to patient to schedule follow-up in early to mid May.

## 2022-06-09 NOTE — Telephone Encounter (Signed)
Noted  

## 2022-06-17 ENCOUNTER — Other Ambulatory Visit: Payer: Self-pay | Admitting: Family Medicine

## 2022-06-17 DIAGNOSIS — F418 Other specified anxiety disorders: Secondary | ICD-10-CM

## 2022-06-17 DIAGNOSIS — I10 Essential (primary) hypertension: Secondary | ICD-10-CM

## 2022-06-17 DIAGNOSIS — E039 Hypothyroidism, unspecified: Secondary | ICD-10-CM

## 2022-06-17 DIAGNOSIS — E785 Hyperlipidemia, unspecified: Secondary | ICD-10-CM

## 2022-06-18 ENCOUNTER — Other Ambulatory Visit: Payer: Self-pay | Admitting: Family Medicine

## 2022-06-18 DIAGNOSIS — I10 Essential (primary) hypertension: Secondary | ICD-10-CM

## 2022-06-20 ENCOUNTER — Telehealth: Payer: Self-pay | Admitting: *Deleted

## 2022-06-20 NOTE — Telephone Encounter (Signed)
Spoke to pt, she informed me that she is only taking MiraLax once daily. I  informed her of recommendation. Pt voiced understanding.

## 2022-06-20 NOTE — Telephone Encounter (Signed)
Is she taking MiraLAX once or twice daily? If once, have her increase to twice daily. If she is already taking MiraLAX twice daily, recommend trial of Trulance 3 mg daily. We can provide samples x3 and would need a progress report in 1-2 weeks.

## 2022-06-20 NOTE — Telephone Encounter (Signed)
Pt called and states she has been taking MiraLax and having small bowel movements. She states she took Dulcolax yesterday and had a good bowel movement last night.

## 2022-06-20 NOTE — Telephone Encounter (Signed)
Noted  

## 2022-06-21 ENCOUNTER — Ambulatory Visit (INDEPENDENT_AMBULATORY_CARE_PROVIDER_SITE_OTHER): Payer: Medicare HMO

## 2022-06-21 VITALS — Ht 64.0 in | Wt 191.0 lb

## 2022-06-21 DIAGNOSIS — Z0000 Encounter for general adult medical examination without abnormal findings: Secondary | ICD-10-CM | POA: Diagnosis not present

## 2022-06-21 NOTE — Progress Notes (Signed)
Subjective:   Leslie Robbins is a 67 y.o. female who presents for Medicare Annual (Subsequent) preventive examination. I connected with  Leslie Robbins on 06/21/22 by a audio enabled telemedicine application and verified that I am speaking with the correct person using two identifiers.  Patient Location: Home  Provider Location: Home Office  I discussed the limitations of evaluation and management by telemedicine. The patient expressed understanding and agreed to proceed.  Review of Systems     Cardiac Risk Factors include: advanced age (>71men, >7 women);hypertension;dyslipidemia     Objective:    Today's Vitals   06/21/22 1353  Weight: 191 lb (86.6 kg)  Height: 5\' 4"  (1.626 m)   Body mass index is 32.79 kg/m.     06/21/2022    1:57 PM 06/17/2021    8:21 AM 08/10/2020    9:58 AM 06/15/2020    4:17 PM 07/14/2016    2:02 PM 07/05/2016    9:49 AM 06/17/2016   11:02 AM  Advanced Directives  Does Patient Have a Medical Advance Directive? No No No No No No No  Would patient like information on creating a medical advance directive? No - Patient declined No - Patient declined  Yes (MAU/Ambulatory/Procedural Areas - Information given) No - Patient declined No - Patient declined No - Patient declined    Current Medications (verified) Outpatient Encounter Medications as of 06/21/2022  Medication Sig   acetaminophen (TYLENOL) 650 MG CR tablet Take 1,300 mg by mouth every 8 (eight) hours as needed for pain.   albuterol (VENTOLIN HFA) 108 (90 Base) MCG/ACT inhaler INHALE 2 PUFFS BY MOUTH EVERY 6 HOURS AS NEEDED FOR WHEEZE OR SHORTNESS OF BREATH   amLODipine (NORVASC) 5 MG tablet Take 1 tablet (5 mg total) by mouth daily. (NEEDS TO BE SEEN BEFORE NEXT REFILL)   aspirin EC 81 MG tablet Take 81 mg by mouth daily.   clorazepate (TRANXENE) 3.75 MG tablet Take 1 tablet (3.75 mg total) by mouth at bedtime as needed for anxiety.   DULoxetine (CYMBALTA) 60 MG capsule Take 1 capsule (60 mg total) by  mouth 2 (two) times daily. (NEEDS TO BE SEEN BEFORE NEXT REFILL)   estradiol (ESTRACE) 0.1 MG/GM vaginal cream PLACE 2 GRAMS VAGINALLY AS DIRECTED EVERY OTHER NIGHT (Patient taking differently: Place 1 Applicatorful vaginally every 3 (three) days.)   ferrous sulfate (FE TABS) 325 (65 FE) MG EC tablet Take 1 tablet (325 mg total) by mouth daily with breakfast. (Patient taking differently: Take 325 mg by mouth every Monday, Wednesday, and Friday.)   fexofenadine (ALLEGRA) 180 MG tablet Take 1 tablet (180 mg total) by mouth daily. For allergy symptoms (Patient taking differently: Take 180 mg by mouth daily as needed for allergies.)   fluticasone (FLONASE) 50 MCG/ACT nasal spray Place 2 sprays into both nostrils daily.   levothyroxine (SYNTHROID) 125 MCG tablet Take 1 tablet (125 mcg total) by mouth daily. (NEEDS TO BE SEEN BEFORE NEXT REFILL)   meloxicam (MOBIC) 15 MG tablet Take 1 tablet (15 mg total) by mouth daily.   Omega-3 Fatty Acids (FISH OIL PO) Take 1 capsule by mouth daily.   omeprazole (PRILOSEC) 40 MG capsule TAKE 1 CAPSULE (40 MG TOTAL) BY MOUTH DAILY. (Patient taking differently: Take 40 mg by mouth at bedtime.)   polyethylene glycol powder (GLYCOLAX/MIRALAX) 17 GM/SCOOP powder Take 17 g by mouth 2 (two) times daily as needed for moderate constipation. For constipation   rosuvastatin (CRESTOR) 20 MG tablet Take 1 tablet (20  mg total) by mouth daily. (NEEDS TO BE SEEN BEFORE NEXT REFILL)   triamterene-hydrochlorothiazide (MAXZIDE-25) 37.5-25 MG tablet Take 1 tablet by mouth daily. As needed for swelling (NEEDS TO BE SEEN BEFORE NEXT REFILL)   No facility-administered encounter medications on file as of 06/21/2022.    Allergies (verified) Sulfa antibiotics, Seroquel [quetiapine], and Sulfonamide derivatives   History: Past Medical History:  Diagnosis Date   Anxiety    Arthritis    Depression    GERD (gastroesophageal reflux disease)    History of hiatal hernia    Hypercholesteremia     Hypertension    Hypothyroidism    Past Surgical History:  Procedure Laterality Date   ABDOMINAL HYSTERECTOMY     BREAST LUMPECTOMY     benign   CARDIAC CATHETERIZATION     9 YRS AGO   COLONOSCOPY N/A 05/30/2012   Procedure: COLONOSCOPY;  Surgeon: Malissa Hippo, MD;  Location: [facility];  Service: Endoscopy;  Laterality: N/A;  830-moved to 1125 Ann to notify pt   DILATION AND CURETTAGE OF UTERUS     X2   JOINT REPLACEMENT Right    right hip   Right Carpal Tunnel Release     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   Procedure: LEFT TOTAL KNEE ARTHROPLASTY;  Surgeon: Loanne Drilling, MD;  Location: [facility];  Service: Orthopedics;  Laterality: Left;   TOTAL SHOULDER ARTHROPLASTY Right 06/17/2016   TOTAL SHOULDER ARTHROPLASTY Right 06/17/2016   Procedure: RIGHT TOTAL SHOULDER ARTHROPLASTY;  Surgeon: Beverely Low, MD;  Location[facility];  Service: Orthopedics;  Laterality: Right;   TUBAL LIGATION     Family History  Problem Relation Age of Onset   Dementia Mother    Hypertension Mother    Lupus Mother    Fibromyalgia Sister    Depression Sister    Bipolar disorder Daughter    Drug abuse Daughter    Breast cancer Maternal Aunt    Breast cancer Cousin    Breast cancer Cousin    Colon cancer Neg Hx    Social History   Socioeconomic History   Marital status: Married    Spouse name: Casimiro Needle   Number of children: 3   Years of education: Not on file   Highest education level: Not on file  Occupational History   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  Tobacco Use   Smoking status: Never   Smokeless tobacco: Never  Vaping Use   Vaping Use: Never used  Substance and Sexual Activity   Alcohol use: No   Drug use: No   Sexual activity: Yes    Birth control/protection: Surgical    Comment: hyst  Other Topics Concern   Not on file  Social History Narrative   3 grandchildren live with her and her husband full time - she has been their full time caregiver since 2019    One of her daughters lives nearby   Social Determinants of Health   Financial Resource Strain: Low Risk  (06/21/2022)   Overall Financial Resource Strain (CARDIA)    Difficulty of Paying Living Expenses: Not hard at all  Food Insecurity: No Food Insecurity (06/21/2022)   Hunger Vital Sign    Worried About Running Out of Food in the Last Year: Never true    Ran Out of Food in the Last Year: Never true  Transportation Needs: No Transportation Needs (06/21/2022)   PRAPARE - Transportation    Lack of Transportation (Medical): No    Lack of  Transportation (Non-Medical): No  Physical Activity: Insufficiently Active (06/21/2022)   Exercise Vital Sign    Days of Exercise per Week: 3 days    Minutes of Exercise per Session: 30 min  Stress: No Stress Concern Present (06/21/2022)   [employer] of Occupational Health - Occupational Stress Questionnaire    Feeling of Stress : Not at all  Social Connections: Moderately Isolated (06/21/2022)   Social Connection and Isolation Panel [NHANES]    Frequency of Communication with Friends and Family: More than three times a week    Frequency of Social Gatherings with Friends and Family: More than three times a week    Attends Religious Services: Never    Database administrator or Organizations: No    Attends Engineer, structural: Never    Marital Status: Married    Tobacco Counseling Counseling given: Not Answered   Clinical Intake:  Pre-visit preparation completed: Yes  Pain : No/denies pain     Nutritional Risks: None Diabetes: No  How often do you need to have someone help you when you read instructions, pamphlets, or other written materials from your doctor or pharmacy?: 1 - Never  Diabetic?no   Interpreter Needed?: No  Information entered by :: Renie Ora, LPN   Activities of Daily Living    06/21/2022    1:57 PM  In your present state of health, do you have any difficulty performing the following activities:   Hearing? 0  Vision? 0  Difficulty concentrating or making decisions? 0  Walking or climbing stairs? 0  Dressing or bathing? 0  Doing errands, shopping? 0  Preparing Food and eating ? N  Using the Toilet? N  In the past six months, have you accidently leaked urine? N  Do you have problems with loss of bowel control? N  Managing your Medications? N  Managing your Finances? N  Housekeeping or managing your Housekeeping? N    Patient Care Team: Mechele Claude, MD as PCP - General (Family Medicine) Delora Fuel, OD (Optometry) Ollen Gross, MD as Consulting Physician (Orthopedic Surgery) Marguerita Merles, Reuel Boom, MD as Consulting Physician (Gastroenterology)  Indicate any recent Medical Services you may have received from other than [facility] providers in the past year (date may be approximate).     Assessment:   This is a routine wellness examination for Leslie Robbins.  Hearing/Vision screen Vision Screening - Comments:: Wears rx glasses - up to date with routine eye exams with  Dr.Johnson   Dietary issues and exercise activities discussed: Current Exercise Habits: Home exercise routine, Type of exercise: walking, Time (Minutes): 30, Frequency (Times/Week): 3, Weekly Exercise (Minutes/Week): 90, Intensity: Mild, Exercise limited by: None identified   Goals Addressed             This Visit's Progress    Exercise 3x per week (30 min per time)   On track      Depression Screen    06/21/2022    1:56 PM 05/13/2022   12:32 PM 02/02/2022   10:30 AM 02/02/2022   10:01 AM 09/16/2021    9:35 AM 08/04/2021    9:49 AM 08/04/2021    8:27 AM  PHQ 2/9 Scores  PHQ - 2 Score 0 1 2 0 2 0 0  PHQ- 9 Score 0 7 9  6 2      Fall Risk    06/21/2022    1:54 PM 05/24/2022   10:46 AM 05/13/2022   12:32 PM 02/02/2022   10:01 AM 09/16/2021  9:35 AM  Fall Risk   Falls in the past year? 0 0 1 0 1  Number falls in past yr: 0  0  0  Injury with Fall? 0  0  0  Risk for fall due to : No Fall Risks       Follow up Falls prevention discussed Falls evaluation completed   Falls prevention discussed    FALL RISK PREVENTION PERTAINING TO THE HOME:  Any stairs in or around the home? No  If so, are there any without handrails? No  Home free of loose throw rugs in walkways, pet beds, electrical cords, etc? Yes  Adequate lighting in your home to reduce risk of falls? Yes   ASSISTIVE DEVICES UTILIZED TO PREVENT FALLS:  Life alert? No  Use of a cane, walker or w/c? No  Grab bars in the bathroom? Yes  Shower chair or bench in shower? Yes  Elevated toilet seat or a handicapped toilet? No       02/25/2019   11:09 AM  MMSE - Mini Mental State Exam  Orientation to time 5  Orientation to Place 5  Registration 3  Attention/ Calculation 5  Recall 2  Language- name 2 objects 2  Language- repeat 1  Language- follow 3 step command 3  Language- read & follow direction 1  Write a sentence 1  Copy design 1  Total score 29        06/21/2022    1:58 PM 06/17/2021    8:24 AM  6CIT Screen  What Year? 0 points 0 points  What month? 0 points 0 points  What time? 0 points 0 points  Count back from 20 0 points 0 points  Months in reverse 0 points 0 points  Repeat phrase 0 points 6 points  Total Score 0 points 6 points    Immunizations Immunization History  Administered Date(s) Administered   Fluad Quad(high Dose 65+) 12/03/2018, 01/28/2020, 03/30/2021, 02/02/2022   Hepatitis B 04/30/2010, 05/31/2010, 10/29/2010   Influenza,inj,Quad PF,6+ Mos 12/31/2012, 12/30/2013, 01/08/2015, 11/26/2015, 12/28/2016   Influenza,inj,quad, With Preservative 12/05/2016   Influenza-Unspecified 12/28/2016   Moderna Sars-Covid-2 Vaccination 06/19/2019, 07/17/2019   Pneumococcal Conjugate-13 02/13/2019   Pneumococcal Polysaccharide-23 07/23/2020   Zoster Recombinat (Shingrix) 03/30/2021, 02/02/2022    TDAP status: Due, Education has been provided regarding the importance of this vaccine. Advised may receive  this vaccine at local pharmacy or Health Dept. Aware to provide a copy of the vaccination record if obtained from local pharmacy or Health Dept. Verbalized acceptance and understanding.  Flu Vaccine status: Up to date  Pneumococcal vaccine status: Up to date  Covid-19 vaccine status: Completed vaccines  Qualifies for Shingles Vaccine? Yes   Zostavax completed Yes   Shingrix Completed?: Yes  Screening Tests Health Maintenance  Topic Date Due   DTaP/Tdap/Td (1 - Tdap) Never done   COVID-19 Vaccine (3 - 2023-24 season) 11/05/2021   COLONOSCOPY (Pts 45-11yrs Insurance coverage will need to be confirmed)  05/31/2022   DEXA SCAN  10/01/2022   INFLUENZA VACCINE  10/06/2022   MAMMOGRAM  02/16/2023   Medicare Annual Wellness (AWV)  06/21/2023   Pneumonia Vaccine 74+ Years old  Completed   Hepatitis C Screening  Completed   Zoster Vaccines- Shingrix  Completed   HPV VACCINES  Aged Out    Health Maintenance  Health Maintenance Due  Topic Date Due   DTaP/Tdap/Td (1 - Tdap) Never done   COVID-19 Vaccine (3 - 2023-24 season) 11/05/2021   COLONOSCOPY (Pts  45-63yrs Insurance coverage will need to be confirmed)  05/31/2022    Colorectal cancer screening: Referral to GI placed scheduled 07/2022. Pt aware the office will call re: appt.  Mammogram status: Completed 02/15/2022. Repeat every year  Bone Density status: Completed 09/30/2020. Results reflect: Bone density results: OSTEOPOROSIS. Repeat every 2 years.  Lung Cancer Screening: (Low Dose CT Chest recommended if Age 70-80 years, 30 pack-year currently smoking OR have quit w/in 15years.) does not qualify.   Lung Cancer Screening Referral: n/a  Additional Screening:  Hepatitis C Screening: does not qualify; Completed 02/13/2019  Vision Screening: Recommended annual ophthalmology exams for early detection of glaucoma and other disorders of the eye. Is the patient up to date with their annual eye exam?  Yes  Who is the provider or  what is the name of the office in which the patient attends annual eye exams? Dr.johnson  If pt is not established with a provider, would they like to be referred to a provider to establish care? No .   Dental Screening: Recommended annual dental exams for proper oral hygiene  Community Resource Referral / Chronic Care Management: CRR required this visit?  No   CCM required this visit?  No      Plan:     I have personally reviewed and noted the following in the patient's chart:   Medical and social history Use of alcohol, tobacco or illicit drugs  Current medications and supplements including opioid prescriptions. Patient is not currently taking opioid prescriptions. Functional ability and status Nutritional status Physical activity Advanced directives List of other physicians Hospitalizations, surgeries, and ER visits in previous 12 months Vitals Screenings to include cognitive, depression, and falls Referrals and appointments  In addition, I have reviewed and discussed with patient certain preventive protocols, quality metrics, and best practice recommendations. A written personalized care plan for preventive services as well as general preventive health recommendations were provided to patient.     Lorrene Reid, LPN   3/47/4259   Nurse Notes: Due Tdap Vaccine

## 2022-06-21 NOTE — Patient Instructions (Signed)
Ms. Leslie Robbins , Thank you for taking time to come for your Medicare Wellness Visit. I appreciate your ongoing commitment to your health goals. Please review the following plan we discussed and let me know if I can assist you in the future.   These are the goals we discussed:  Goals      Exercise 3x per week (30 min per time)        This is a list of the screening recommended for you and due dates:  Health Maintenance  Topic Date Due   DTaP/Tdap/Td vaccine (1 - Tdap) Never done   COVID-19 Vaccine (3 - 2023-24 season) 11/05/2021   Colon Cancer Screening  05/31/2022   DEXA scan (bone density measurement)  10/01/2022   Flu Shot  10/06/2022   Mammogram  02/16/2023   Medicare Annual Wellness Visit  06/21/2023   Pneumonia Vaccine  Completed   Hepatitis C Screening: USPSTF Recommendation to screen - Ages 18-79 yo.  Completed   Zoster (Shingles) Vaccine  Completed   HPV Vaccine  Aged Out    Advanced directives: Advance directive discussed with you today. I have provided a copy for you to complete at home and have notarized. Once this is complete please bring a copy in to our office so we can scan it into your chart.   Conditions/risks identified: Aim for 30 minutes of exercise or brisk walking, 6-8 glasses of water, and 5 servings of fruits and vegetables each day.   Next appointment: Follow up in one year for your annual wellness visit    Preventive Care 65 Years and Older, Female Preventive care refers to lifestyle choices and visits with your health care provider that can promote health and wellness. What does preventive care include? A yearly physical exam. This is also called an annual well check. Dental exams once or twice a year. Routine eye exams. Ask your health care provider how often you should have your eyes checked. Personal lifestyle choices, including: Daily care of your teeth and gums. Regular physical activity. Eating a healthy diet. Avoiding tobacco and drug  use. Limiting alcohol use. Practicing safe sex. Taking low-dose aspirin every day. Taking vitamin and mineral supplements as recommended by your health care provider. What happens during an annual well check? The services and screenings done by your health care provider during your annual well check will depend on your age, overall health, lifestyle risk factors, and family history of disease. Counseling  Your health care provider may ask you questions about your: Alcohol use. Tobacco use. Drug use. Emotional well-being. Home and relationship well-being. Sexual activity. Eating habits. History of falls. Memory and ability to understand (cognition). Work and work Astronomer. Reproductive health. Screening  You may have the following tests or measurements: Height, weight, and BMI. Blood pressure. Lipid and cholesterol levels. These may be checked every 5 years, or more frequently if you are over 78 years old. Skin check. Lung cancer screening. You may have this screening every year starting at age 57 if you have a 30-pack-year history of smoking and currently smoke or have quit within the past 15 years. Fecal occult blood test (FOBT) of the stool. You may have this test every year starting at age 64. Flexible sigmoidoscopy or colonoscopy. You may have a sigmoidoscopy every 5 years or a colonoscopy every 10 years starting at age 41. Hepatitis C blood test. Hepatitis B blood test. Sexually transmitted disease (STD) testing. Diabetes screening. This is done by checking your blood sugar (glucose) after  you have not eaten for a while (fasting). You may have this done every 1-3 years. Bone density scan. This is done to screen for osteoporosis. You may have this done starting at age 24. Mammogram. This may be done every 1-2 years. Talk to your health care provider about how often you should have regular mammograms. Talk with your health care provider about your test results, treatment  options, and if necessary, the need for more tests. Vaccines  Your health care provider may recommend certain vaccines, such as: Influenza vaccine. This is recommended every year. Tetanus, diphtheria, and acellular pertussis (Tdap, Td) vaccine. You may need a Td booster every 10 years. Zoster vaccine. You may need this after age 55. Pneumococcal 13-valent conjugate (PCV13) vaccine. One dose is recommended after age 50. Pneumococcal polysaccharide (PPSV23) vaccine. One dose is recommended after age 27. Talk to your health care provider about which screenings and vaccines you need and how often you need them. This information is not intended to replace advice given to you by your health care provider. Make sure you discuss any questions you have with your health care provider. Document Released: 03/20/2015 Document Revised: 11/11/2015 Document Reviewed: 12/23/2014 Elsevier Interactive Patient Education  2017 [place] Prevention in the Home Falls can cause injuries. They can happen to people of all ages. There are many things you can do to make your home safe and to help prevent falls. What can I do on the outside of my home? Regularly fix the edges of walkways and driveways and fix any cracks. Remove anything that might make you trip as you walk through a door, such as a raised step or threshold. Trim any bushes or trees on the path to your home. Use bright outdoor lighting. Clear any walking paths of anything that might make someone trip, such as rocks or tools. Regularly check to see if handrails are loose or broken. Make sure that both sides of any steps have handrails. Any raised decks and porches should have guardrails on the edges. Have any leaves, snow, or ice cleared regularly. Use sand or salt on walking paths during winter. Clean up any spills in your garage right away. This includes oil or grease spills. What can I do in the bathroom? Use night lights. Install grab  bars by the toilet and in the tub and shower. Do not use towel bars as grab bars. Use non-skid mats or decals in the tub or shower. If you need to sit down in the shower, use a plastic, non-slip stool. Keep the floor dry. Clean up any water that spills on the floor as soon as it happens. Remove soap buildup in the tub or shower regularly. Attach bath mats securely with double-sided non-slip rug tape. Do not have throw rugs and other things on the floor that can make you trip. What can I do in the bedroom? Use night lights. Make sure that you have a light by your bed that is easy to reach. Do not use any sheets or blankets that are too big for your bed. They should not hang down onto the floor. Have a firm chair that has side arms. You can use this for support while you get dressed. Do not have throw rugs and other things on the floor that can make you trip. What can I do in the kitchen? Clean up any spills right away. Avoid walking on wet floors. Keep items that you use a lot in easy-to-reach places. If you  need to reach something above you, use a strong step stool that has a grab bar. Keep electrical cords out of the way. Do not use floor polish or wax that makes floors slippery. If you must use wax, use non-skid floor wax. Do not have throw rugs and other things on the floor that can make you trip. What can I do with my stairs? Do not leave any items on the stairs. Make sure that there are handrails on both sides of the stairs and use them. Fix handrails that are broken or loose. Make sure that handrails are as long as the stairways. Check any carpeting to make sure that it is firmly attached to the stairs. Fix any carpet that is loose or worn. Avoid having throw rugs at the top or bottom of the stairs. If you do have throw rugs, attach them to the floor with carpet tape. Make sure that you have a light switch at the top of the stairs and the bottom of the stairs. If you do not have them,  ask someone to add them for you. What else can I do to help prevent falls? Wear shoes that: Do not have high heels. Have rubber bottoms. Are comfortable and fit you well. Are closed at the toe. Do not wear sandals. If you use a stepladder: Make sure that it is fully opened. Do not climb a closed stepladder. Make sure that both sides of the stepladder are locked into place. Ask someone to hold it for you, if possible. Clearly mark and make sure that you can see: Any grab bars or handrails. First and last steps. Where the edge of each step is. Use tools that help you move around (mobility aids) if they are needed. These include: Canes. Walkers. Scooters. Crutches. Turn on the lights when you go into a dark area. Replace any light bulbs as soon as they burn out. Set up your furniture so you have a clear path. Avoid moving your furniture around. If any of your floors are uneven, fix them. If there are any pets around you, be aware of where they are. Review your medicines with your doctor. Some medicines can make you feel dizzy. This can increase your chance of falling. Ask your doctor what other things that you can do to help prevent falls. This information is not intended to replace advice given to you by your health care provider. Make sure you discuss any questions you have with your health care provider. Document Released: 12/18/2008 Document Revised: 07/30/2015 Document Reviewed: 03/28/2014 Elsevier Interactive Patient Education  2017 [employer].

## 2022-06-22 NOTE — Patient Instructions (Signed)
Leslie Robbins  06/22/2022     @   Your procedure is scheduled on  06/27/2022.   Report to [facility] at  0815  A.M.   Call this number if you have problems the morning of surgery:  [phone]  If you experience any cold or flu symptoms such as cough, fever, chills, shortness of breath, etc. between now and your scheduled surgery, please notify us at the above number.   Remember:  Do not eat or drink after midnight.      Take these medicines the morning of surgery with A SIP OF WATER           Amlodipine, cymbalta, allegra, levothyroxine.     Do not wear jewelry, make-up or nail polish.  Do not wear lotions, powders, or perfumes, or deodorant.  Do not shave 48 hours prior to surgery.  Men may shave face and neck.  Do not bring valuables to the hospital.  [facility] is not responsible for any belongings or valuables.  Contacts, dentures or bridgework may not be worn into surgery.  Leave your suitcase in the car.  After surgery it may be brought to your room.  For patients admitted to the hospital, discharge time will be determined by your treatment team.  Patients discharged the day of surgery will not be allowed to drive home and must have someone with them for 24 hours.    Special instructions:   DO NOT smoke tobacco or vape for 24 hours before your procedure.  Please read over the following fact sheets that you were given. Pain Booklet, Coughing and Deep Breathing, Surgical Site Infection Prevention, Anesthesia Post-op Instructions, and Care and Recovery After Surgery      Minimally Invasive Cholecystectomy, Care After The following information offers guidance on how to care for yourself after your procedure. Your health care provider may also give you more specific instructions. If you have problems or questions, contact your health care provider. What can I expect after the procedure? After the procedure, it is common to have: Pain  at your incision sites. You will be given medicines to control this pain. Mild nausea or vomiting. Bloating and possible shoulder pain from the gas that was used during the procedure. Follow these instructions at home: Medicines Take over-the-counter and prescription medicines only as told by your health care provider. If you were prescribed an antibiotic medicine, take it as told by your health care provider. Do not stop using the antibiotic even if you start to feel better. Ask your health care provider if the medicine prescribed to you: Requires you to avoid driving or using machinery. Can cause constipation. You may need to take these actions to prevent or treat constipation: Drink enough fluid to keep your urine pale yellow. Take over-the-counter or prescription medicines. Eat foods that are high in fiber, such as beans, whole grains, and fresh fruits and vegetables. Limit foods that are high in fat and processed sugars, such as fried or sweet foods. Incision care  Follow instructions from your health care provider about how to take care of your incisions. Make sure you: Wash your hands with soap and water for at least 20 seconds before and after you change your bandage (dressing). If soap and water are not available, use hand sanitizer. Change your dressing as told by your health care provider. Leave stitches (sutures), skin glue, or adhesive strips in place. These skin closures may need to  be in place for 2 weeks or longer. If adhesive strip edges start to loosen and curl up, you may trim the loose edges. Do not remove adhesive strips completely unless your health care provider tells you to do that. Do not take baths, swim, or use a hot tub until your health care provider approves. Ask your health care provider if you may take showers. You may only be allowed to take sponge baths. Check your incision area every day for signs of infection. Check for: More redness, swelling, or  pain. Fluid or blood. Warmth. Pus or a bad smell. Activity Rest as told by your health care provider. Do not do activities that require a lot of effort. Avoid sitting for a long time without moving. Get up to take short walks every 1-2 hours. This is important to improve blood flow and breathing. Ask for help if you feel weak or unsteady. Do not lift anything that is heavier than 10 lb (4.5 kg), or the limit that you are told, until your health care provider says that it is safe. Do not play contact sports until your health care provider approves. Do not return to work or school until your health care provider approves. Return to your normal activities as told by your health care provider. Ask your health care provider what activities are safe for you. General instructions If you were given a sedative during the procedure, it can affect you for several hours. Do not drive or operate machinery until your health care provider says that it is safe. Keep all follow-up visits. This is important. Contact a health care provider if: You develop a rash. You have more redness, swelling, or pain around your incisions. You have fluid or blood coming from your incisions. Your incisions feel warm to the touch. You have pus or a bad smell coming from your incisions. You have a fever. One or more of your incisions breaks open. Get help right away if: You have trouble breathing. You have chest pain. You have more pain in your shoulders. You faint or feel dizzy when you stand. You have severe pain in your abdomen. You have nausea or vomiting that lasts for more than one day. You have leg pain that is new or unusual, or if it is localized to one specific spot. These symptoms may represent a serious problem that is an emergency. Do not wait to see if the symptoms will go away. Get medical help right away. Call your local emergency services (911 in the [place] Do not drive yourself to the  hospital. Summary After your procedure, it is common to have pain at the incision sites. You may also have nausea or bloating. Follow your health care provider's instructions about medicine, activity restrictions, and caring for your incision areas. Do not do activities that require a lot of effort. Contact a health care provider if you have a fever or other signs of infection, such as more redness, swelling, or pain around the incisions. Get help right away if you have chest pain, increasing pain in the shoulders, or trouble breathing. This information is not intended to replace advice given to you by your health care provider. Make sure you discuss any questions you have with your health care provider. Document Revised: 08/25/2020 Document Reviewed: 08/25/2020 Elsevier Patient Education  2023 [employer] General Anesthesia, Adult, Care After The following information offers guidance on how to care for yourself after your procedure. Your health care provider may also give you more  specific instructions. If you have problems or questions, contact your health care provider. What can I expect after the procedure? After the procedure, it is common for people to: Have pain or discomfort at the IV site. Have nausea or vomiting. Have a sore throat or hoarseness. Have trouble concentrating. Feel cold or chills. Feel weak, sleepy, or tired (fatigue). Have soreness and body aches. These can affect parts of the body that were not involved in surgery. Follow these instructions at home: For the time period you were told by your health care provider:  Rest. Do not participate in activities where you could fall or become injured. Do not drive or use machinery. Do not drink alcohol. Do not take sleeping pills or medicines that cause drowsiness. Do not make important decisions or sign legal documents. Do not take care of children on your own. General instructions Drink enough fluid to keep your  urine pale yellow. If you have sleep apnea, surgery and certain medicines can increase your risk for breathing problems. Follow instructions from your health care provider about wearing your sleep device: Anytime you are sleeping, including during daytime naps. While taking prescription pain medicines, sleeping medicines, or medicines that make you drowsy. Return to your normal activities as told by your health care provider. Ask your health care provider what activities are safe for you. Take over-the-counter and prescription medicines only as told by your health care provider. Do not use any products that contain nicotine or tobacco. These products include cigarettes, chewing tobacco, and vaping devices, such as e-cigarettes. These can delay incision healing after surgery. If you need help quitting, ask your health care provider. Contact a health care provider if: You have nausea or vomiting that does not get better with medicine. You vomit every time you eat or drink. You have pain that does not get better with medicine. You cannot urinate or have bloody urine. You develop a skin rash. You have a fever. Get help right away if: You have trouble breathing. You have chest pain. You vomit blood. These symptoms may be an emergency. Get help right away. Call 911. Do not wait to see if the symptoms will go away. Do not drive yourself to the hospital. Summary After the procedure, it is common to have a sore throat, hoarseness, nausea, vomiting, or to feel weak, sleepy, or fatigue. For the time period you were told by your health care provider, do not drive or use machinery. Get help right away if you have difficulty breathing, have chest pain, or vomit blood. These symptoms may be an emergency. This information is not intended to replace advice given to you by your health care provider. Make sure you discuss any questions you have with your health care provider. Document Revised: 05/21/2021  Document Reviewed: 05/21/2021 Elsevier Patient Education  2023 [employer] How to Use Chlorhexidine Before Surgery Chlorhexidine gluconate (CHG) is a germ-killing (antiseptic) solution that is used to clean the skin. It can get rid of the bacteria that normally live on the skin and can keep them away for about 24 hours. To clean your skin with CHG, you may be given: A CHG solution to use in the shower or as part of a sponge bath. A prepackaged cloth that contains CHG. Cleaning your skin with CHG may help lower the risk for infection: While you are staying in the intensive care unit of the hospital. If you have a vascular access, such as a central line, to provide short-term or long-term access  to your veins. If you have a catheter to drain urine from your bladder. If you are on a ventilator. A ventilator is a machine that helps you breathe by moving air in and out of your lungs. After surgery. What are the risks? Risks of using CHG include: A skin reaction. Hearing loss, if CHG gets in your ears and you have a perforated eardrum. Eye injury, if CHG gets in your eyes and is not rinsed out. The CHG product catching fire. Make sure that you avoid smoking and flames after applying CHG to your skin. Do not use CHG: If you have a chlorhexidine allergy or have previously reacted to chlorhexidine. On babies younger than 10 months of age. How to use CHG solution Use CHG only as told by your health care provider, and follow the instructions on the label. Use the full amount of CHG as directed. Usually, this is one bottle. During a shower Follow these steps when using CHG solution during a shower (unless your health care provider gives you different instructions): Start the shower. Use your normal soap and shampoo to wash your face and hair. Turn off the shower or move out of the shower stream. Pour the CHG onto a clean washcloth. Do not use any type of brush or rough-edged sponge. Starting at  your neck, lather your body down to your toes. Make sure you follow these instructions: If you will be having surgery, pay special attention to the part of your body where you will be having surgery. Scrub this area for at least 1 minute. Do not use CHG on your head or face. If the solution gets into your ears or eyes, rinse them well with water. Avoid your genital area. Avoid any areas of skin that have broken skin, cuts, or scrapes. Scrub your back and under your arms. Make sure to wash skin folds. Let the lather sit on your skin for 1-2 minutes or as long as told by your health care provider. Thoroughly rinse your entire body in the shower. Make sure that all body creases and crevices are rinsed well. Dry off with a clean towel. Do not put any substances on your body afterward--such as powder, lotion, or perfume--unless you are told to do so by your health care provider. Only use lotions that are recommended by the manufacturer. Put on clean clothes or pajamas. If it is the night before your surgery, sleep in clean sheets.  During a sponge bath Follow these steps when using CHG solution during a sponge bath (unless your health care provider gives you different instructions): Use your normal soap and shampoo to wash your face and hair. Pour the CHG onto a clean washcloth. Starting at your neck, lather your body down to your toes. Make sure you follow these instructions: If you will be having surgery, pay special attention to the part of your body where you will be having surgery. Scrub this area for at least 1 minute. Do not use CHG on your head or face. If the solution gets into your ears or eyes, rinse them well with water. Avoid your genital area. Avoid any areas of skin that have broken skin, cuts, or scrapes. Scrub your back and under your arms. Make sure to wash skin folds. Let the lather sit on your skin for 1-2 minutes or as long as told by your health care provider. Using a  different clean, wet washcloth, thoroughly rinse your entire body. Make sure that all body creases and crevices  are rinsed well. Dry off with a clean towel. Do not put any substances on your body afterward--such as powder, lotion, or perfume--unless you are told to do so by your health care provider. Only use lotions that are recommended by the manufacturer. Put on clean clothes or pajamas. If it is the night before your surgery, sleep in clean sheets. How to use CHG prepackaged cloths Only use CHG cloths as told by your health care provider, and follow the instructions on the label. Use the CHG cloth on clean, dry skin. Do not use the CHG cloth on your head or face unless your health care provider tells you to. When washing with the CHG cloth: Avoid your genital area. Avoid any areas of skin that have broken skin, cuts, or scrapes. Before surgery Follow these steps when using a CHG cloth to clean before surgery (unless your health care provider gives you different instructions): Using the CHG cloth, vigorously scrub the part of your body where you will be having surgery. Scrub using a back-and-forth motion for 3 minutes. The area on your body should be completely wet with CHG when you are done scrubbing. Do not rinse. Discard the cloth and let the area air-dry. Do not put any substances on the area afterward, such as powder, lotion, or perfume. Put on clean clothes or pajamas. If it is the night before your surgery, sleep in clean sheets.  For general bathing Follow these steps when using CHG cloths for general bathing (unless your health care provider gives you different instructions). Use a separate CHG cloth for each area of your body. Make sure you wash between any folds of skin and between your fingers and toes. Wash your body in the following order, switching to a new cloth after each step: The front of your neck, shoulders, and chest. Both of your arms, under your arms, and your  hands. Your stomach and groin area, avoiding the genitals. Your right leg and foot. Your left leg and foot. The back of your neck, your back, and your buttocks. Do not rinse. Discard the cloth and let the area air-dry. Do not put any substances on your body afterward--such as powder, lotion, or perfume--unless you are told to do so by your health care provider. Only use lotions that are recommended by the manufacturer. Put on clean clothes or pajamas. Contact a health care provider if: Your skin gets irritated after scrubbing. You have questions about using your solution or cloth. You swallow any chlorhexidine. Call your local poison control center [phone] in the [place] Get help right away if: Your eyes itch badly, or they become very red or swollen. Your skin itches badly and is red or swollen. Your hearing changes. You have trouble seeing. You have swelling or tingling in your mouth or throat. You have trouble breathing. These symptoms may represent a serious problem that is an emergency. Do not wait to see if the symptoms will go away. Get medical help right away. Call your local emergency services (911 in the [place] Do not drive yourself to the hospital. Summary Chlorhexidine gluconate (CHG) is a germ-killing (antiseptic) solution that is used to clean the skin. Cleaning your skin with CHG may help to lower your risk for infection. You may be given CHG to use for bathing. It may be in a bottle or in a prepackaged cloth to use on your skin. Carefully follow your health care provider's instructions and the instructions on the product label. Do not use CHG  if you have a chlorhexidine allergy. Contact your health care provider if your skin gets irritated after scrubbing. This information is not intended to replace advice given to you by your health care provider. Make sure you discuss any questions you have with your health care provider. Document Revised: 06/21/2021 Document  Reviewed: 05/04/2020 Elsevier Patient Education  2023 [employer]

## 2022-06-23 ENCOUNTER — Encounter (HOSPITAL_COMMUNITY)
Admission: RE | Admit: 2022-06-23 | Discharge: 2022-06-23 | Disposition: A | Discharge status: Home / Self Care | Payer: Medicare HMO | Source: Ambulatory Visit | Attending: General Surgery | Admitting: General Surgery

## 2022-06-23 VITALS — BP 129/87 | HR 95 | Temp 97.5°F | Resp 18 | Ht 64.0 in | Wt 191.0 lb

## 2022-06-23 DIAGNOSIS — Z01810 Encounter for preprocedural cardiovascular examination: Secondary | ICD-10-CM | POA: Insufficient documentation

## 2022-06-23 DIAGNOSIS — I10 Essential (primary) hypertension: Secondary | ICD-10-CM | POA: Insufficient documentation

## 2022-06-27 ENCOUNTER — Encounter (HOSPITAL_COMMUNITY)
Admission: RE | Disposition: A | Discharge status: Home / Self Care | Payer: Self-pay | Source: Home / Self Care | Attending: General Surgery

## 2022-06-27 ENCOUNTER — Encounter (HOSPITAL_COMMUNITY): Payer: Self-pay | Admitting: General Surgery

## 2022-06-27 ENCOUNTER — Ambulatory Visit (HOSPITAL_BASED_OUTPATIENT_CLINIC_OR_DEPARTMENT_OTHER): Payer: Medicare HMO | Admitting: Certified Registered"

## 2022-06-27 ENCOUNTER — Ambulatory Visit (HOSPITAL_COMMUNITY): Payer: Medicare HMO | Admitting: Certified Registered"

## 2022-06-27 ENCOUNTER — Other Ambulatory Visit: Payer: Self-pay

## 2022-06-27 ENCOUNTER — Ambulatory Visit (HOSPITAL_COMMUNITY)
Admission: RE | Admit: 2022-06-27 | Discharge: 2022-06-27 | Disposition: A | Discharge status: Home / Self Care | Payer: Medicare HMO | Attending: General Surgery | Admitting: General Surgery

## 2022-06-27 DIAGNOSIS — F32A Depression, unspecified: Secondary | ICD-10-CM | POA: Insufficient documentation

## 2022-06-27 DIAGNOSIS — K449 Diaphragmatic hernia without obstruction or gangrene: Secondary | ICD-10-CM | POA: Diagnosis not present

## 2022-06-27 DIAGNOSIS — D638 Anemia in other chronic diseases classified elsewhere: Secondary | ICD-10-CM | POA: Diagnosis not present

## 2022-06-27 DIAGNOSIS — K8010 Calculus of gallbladder with chronic cholecystitis without obstruction: Secondary | ICD-10-CM | POA: Diagnosis present

## 2022-06-27 DIAGNOSIS — F418 Other specified anxiety disorders: Secondary | ICD-10-CM

## 2022-06-27 DIAGNOSIS — K8020 Calculus of gallbladder without cholecystitis without obstruction: Secondary | ICD-10-CM | POA: Diagnosis not present

## 2022-06-27 DIAGNOSIS — M1712 Unilateral primary osteoarthritis, left knee: Secondary | ICD-10-CM

## 2022-06-27 DIAGNOSIS — I10 Essential (primary) hypertension: Secondary | ICD-10-CM

## 2022-06-27 DIAGNOSIS — D649 Anemia, unspecified: Secondary | ICD-10-CM | POA: Diagnosis not present

## 2022-06-27 DIAGNOSIS — K219 Gastro-esophageal reflux disease without esophagitis: Secondary | ICD-10-CM | POA: Insufficient documentation

## 2022-06-27 DIAGNOSIS — E039 Hypothyroidism, unspecified: Secondary | ICD-10-CM

## 2022-06-27 DIAGNOSIS — D759 Disease of blood and blood-forming organs, unspecified: Secondary | ICD-10-CM | POA: Diagnosis not present

## 2022-06-27 DIAGNOSIS — F419 Anxiety disorder, unspecified: Secondary | ICD-10-CM | POA: Insufficient documentation

## 2022-06-27 HISTORY — PX: ROBOTIC ASSISTED LAPAROSCOPIC CHOLECYSTECTOMY-MULTI SITE: SHX6603

## 2022-06-27 SURGERY — CHOLECYSTECTOMY, ROBOT-ASSISTED, LAPAROSCOPIC
Anesthesia: General | Site: Abdomen

## 2022-06-27 MED ORDER — EPHEDRINE SULFATE-NACL 50-0.9 MG/10ML-% IV SOSY
PREFILLED_SYRINGE | INTRAVENOUS | Status: DC | PRN
  Administered 2022-06-27: 10 mg via INTRAVENOUS
  Administered 2022-06-27: 5 mg via INTRAVENOUS

## 2022-06-27 MED ORDER — ONDANSETRON HCL 4 MG/2ML IJ SOLN
INTRAMUSCULAR | Status: AC
  Filled 2022-06-27: qty 2

## 2022-06-27 MED ORDER — PROPOFOL 10 MG/ML IV BOLUS
INTRAVENOUS | Status: AC
  Filled 2022-06-27: qty 20

## 2022-06-27 MED ORDER — DEXAMETHASONE SODIUM PHOSPHATE 4 MG/ML IJ SOLN
INTRAMUSCULAR | Status: AC
  Filled 2022-06-27: qty 1

## 2022-06-27 MED ORDER — PHENYLEPHRINE 80 MCG/ML (10ML) SYRINGE FOR IV PUSH (FOR BLOOD PRESSURE SUPPORT)
PREFILLED_SYRINGE | INTRAVENOUS | Status: AC
  Filled 2022-06-27: qty 10

## 2022-06-27 MED ORDER — MIDAZOLAM HCL 2 MG/2ML IJ SOLN
INTRAMUSCULAR | Status: DC | PRN
  Administered 2022-06-27: 2 mg via INTRAVENOUS

## 2022-06-27 MED ORDER — LIDOCAINE HCL (PF) 2 % IJ SOLN
INTRAMUSCULAR | Status: AC
  Filled 2022-06-27: qty 5

## 2022-06-27 MED ORDER — ACETAMINOPHEN 500 MG PO TABS
1000.0000 mg | ORAL_TABLET | Freq: Four times a day (QID) | ORAL | Status: AC | PRN
  Administered 2022-06-27: 1000 mg via ORAL

## 2022-06-27 MED ORDER — BUPIVACAINE HCL (PF) 0.5 % IJ SOLN
INTRAMUSCULAR | Status: AC
  Filled 2022-06-27: qty 30

## 2022-06-27 MED ORDER — MELOXICAM 15 MG PO TABS
15.0000 mg | ORAL_TABLET | Freq: Every day | ORAL | 3 refills | Status: DC
Start: 2022-07-04 — End: 2022-07-25

## 2022-06-27 MED ORDER — PROPOFOL 10 MG/ML IV BOLUS
INTRAVENOUS | Status: DC | PRN
  Administered 2022-06-27: 150 mg via INTRAVENOUS

## 2022-06-27 MED ORDER — HYDROMORPHONE HCL 1 MG/ML IJ SOLN
0.2500 mg | INTRAMUSCULAR | Status: DC | PRN
  Administered 2022-06-27 (×3): 0.5 mg via INTRAVENOUS
  Filled 2022-06-27 (×2): qty 0.5

## 2022-06-27 MED ORDER — ROCURONIUM BROMIDE 10 MG/ML (PF) SYRINGE
PREFILLED_SYRINGE | INTRAVENOUS | Status: AC
  Filled 2022-06-27: qty 10

## 2022-06-27 MED ORDER — MIDAZOLAM HCL 2 MG/2ML IJ SOLN
INTRAMUSCULAR | Status: AC
  Filled 2022-06-27: qty 2

## 2022-06-27 MED ORDER — FISH OIL 300 MG PO CAPS: 1.0000 | ORAL_CAPSULE | Freq: Every day | ORAL | Status: DC

## 2022-06-27 MED ORDER — SODIUM CHLORIDE 0.9 % IR SOLN
Status: DC | PRN
  Administered 2022-06-27: 3000 mL

## 2022-06-27 MED ORDER — ASPIRIN EC 81 MG PO TBEC: 81.0000 mg | DELAYED_RELEASE_TABLET | Freq: Every day | ORAL | 11 refills | Status: AC

## 2022-06-27 MED ORDER — FENTANYL CITRATE (PF) 250 MCG/5ML IJ SOLN
INTRAMUSCULAR | Status: DC | PRN
  Administered 2022-06-27: 50 ug via INTRAVENOUS
  Administered 2022-06-27: 25 ug via INTRAVENOUS
  Administered 2022-06-27: 50 ug via INTRAVENOUS
  Administered 2022-06-27: 25 ug via INTRAVENOUS
  Administered 2022-06-27 (×2): 50 ug via INTRAVENOUS

## 2022-06-27 MED ORDER — ONDANSETRON HCL 4 MG/2ML IJ SOLN: 4.0000 mg | Freq: Once | INTRAMUSCULAR | Status: DC | PRN

## 2022-06-27 MED ORDER — INDOCYANINE GREEN 25 MG IV SOLR
INTRAVENOUS | Status: AC
  Filled 2022-06-27: qty 10

## 2022-06-27 MED ORDER — CHLORHEXIDINE GLUCONATE 0.12 % MT SOLN
15.0000 mL | Freq: Once | OROMUCOSAL | Status: AC
  Administered 2022-06-27: 15 mL via OROMUCOSAL

## 2022-06-27 MED ORDER — LIDOCAINE 2% (20 MG/ML) 5 ML SYRINGE
INTRAMUSCULAR | Status: DC | PRN
  Administered 2022-06-27: 100 mg via INTRAVENOUS

## 2022-06-27 MED ORDER — SUGAMMADEX SODIUM 200 MG/2ML IV SOLN
INTRAVENOUS | Status: DC | PRN
  Administered 2022-06-27: 200 mg via INTRAVENOUS

## 2022-06-27 MED ORDER — ROCURONIUM BROMIDE 10 MG/ML (PF) SYRINGE
PREFILLED_SYRINGE | INTRAVENOUS | Status: DC | PRN
  Administered 2022-06-27: 50 mg via INTRAVENOUS

## 2022-06-27 MED ORDER — KETOROLAC TROMETHAMINE 30 MG/ML IJ SOLN
INTRAMUSCULAR | Status: AC
  Filled 2022-06-27: qty 1

## 2022-06-27 MED ORDER — ONDANSETRON HCL 4 MG/2ML IJ SOLN
INTRAMUSCULAR | Status: DC | PRN
  Administered 2022-06-27: 4 mg via INTRAVENOUS

## 2022-06-27 MED ORDER — CHLORHEXIDINE GLUCONATE CLOTH 2 % EX PADS: 6.0000 | MEDICATED_PAD | Freq: Once | CUTANEOUS | Status: DC

## 2022-06-27 MED ORDER — DEXAMETHASONE SODIUM PHOSPHATE 4 MG/ML IJ SOLN
4.0000 mg | Freq: Once | INTRAMUSCULAR | Status: AC
  Administered 2022-06-27: 4 mg via INTRAVENOUS

## 2022-06-27 MED ORDER — PHENYLEPHRINE 80 MCG/ML (10ML) SYRINGE FOR IV PUSH (FOR BLOOD PRESSURE SUPPORT)
PREFILLED_SYRINGE | INTRAVENOUS | Status: DC | PRN
  Administered 2022-06-27 (×2): 160 ug via INTRAVENOUS

## 2022-06-27 MED ORDER — ONDANSETRON HCL 4 MG PO TABS: 4.0000 mg | ORAL_TABLET | Freq: Three times a day (TID) | ORAL | 1 refills | Status: DC | PRN

## 2022-06-27 MED ORDER — OXYCODONE HCL 5 MG PO TABS: 5.0000 mg | ORAL_TABLET | ORAL | 0 refills | Status: DC | PRN

## 2022-06-27 MED ORDER — ACETAMINOPHEN 500 MG PO TABS
ORAL_TABLET | ORAL | Status: AC
  Filled 2022-06-27: qty 2

## 2022-06-27 MED ORDER — SEVOFLURANE IN SOLN
RESPIRATORY_TRACT | Status: AC
  Filled 2022-06-27: qty 250

## 2022-06-27 MED ORDER — HEMOSTATIC AGENTS (NO CHARGE) OPTIME
TOPICAL | Status: DC | PRN
  Administered 2022-06-27: 1 via TOPICAL

## 2022-06-27 MED ORDER — BUPIVACAINE HCL (PF) 0.5 % IJ SOLN
INTRAMUSCULAR | Status: DC | PRN
  Administered 2022-06-27: 30 mL

## 2022-06-27 MED ORDER — MEPERIDINE HCL 50 MG/ML IJ SOLN: 6.2500 mg | INTRAMUSCULAR | Status: DC | PRN

## 2022-06-27 MED ORDER — LACTATED RINGERS IV SOLN
INTRAVENOUS | Status: DC
  Administered 2022-06-27 (×2): 1000 mL via INTRAVENOUS

## 2022-06-27 MED ORDER — SODIUM CHLORIDE 0.9 % IV SOLN
2.0000 g | INTRAVENOUS | Status: AC
  Administered 2022-06-27: 2 g via INTRAVENOUS
  Filled 2022-06-27: qty 2

## 2022-06-27 MED ORDER — HYDROMORPHONE HCL 1 MG/ML IJ SOLN
INTRAMUSCULAR | Status: AC
  Filled 2022-06-27: qty 0.5

## 2022-06-27 MED ORDER — EPHEDRINE 5 MG/ML INJ
INTRAVENOUS | Status: AC
  Filled 2022-06-27: qty 5

## 2022-06-27 MED ORDER — INDOCYANINE GREEN 25 MG IV SOLR
2.5000 mg | Freq: Once | INTRAVENOUS | Status: AC
  Administered 2022-06-27: 2.5 mg via INTRAVENOUS
  Filled 2022-06-27: qty 10

## 2022-06-27 MED ORDER — ORAL CARE MOUTH RINSE: 15.0000 mL | Freq: Once | OROMUCOSAL | Status: AC

## 2022-06-27 MED ORDER — FENTANYL CITRATE (PF) 250 MCG/5ML IJ SOLN
INTRAMUSCULAR | Status: AC
  Filled 2022-06-27: qty 5

## 2022-06-27 MED ORDER — DEXAMETHASONE SODIUM PHOSPHATE 4 MG/ML IJ SOLN
INTRAMUSCULAR | Status: DC | PRN
  Administered 2022-06-27: 5 mg via INTRAVENOUS

## 2022-06-27 MED ORDER — STERILE WATER FOR IRRIGATION IR SOLN
Status: DC | PRN
  Administered 2022-06-27: 500 mL

## 2022-06-27 SURGICAL SUPPLY — 53 items
ADH SKN CLS APL DERMABOND .7 (GAUZE/BANDAGES/DRESSINGS) ×1
APL PRP STRL LF DISP 70% ISPRP (MISCELLANEOUS) ×1
BLADE SURG 15 STRL LF DISP TIS (BLADE) ×1 IMPLANT
BLADE SURG 15 STRL SS (BLADE) ×1
CAUTERY HOOK MNPLR 1.6 DVNC XI (INSTRUMENTS) ×1 IMPLANT
CHLORAPREP W/TINT 26 (MISCELLANEOUS) ×1 IMPLANT
CLIP LIGATING HEM O LOK PURPLE (MISCELLANEOUS) ×1 IMPLANT
COVER LIGHT HANDLE STERIS (MISCELLANEOUS) ×2 IMPLANT
COVER TIP SHEARS 8 DVNC (MISCELLANEOUS) ×1 IMPLANT
DERMABOND ADVANCED .7 DNX12 (GAUZE/BANDAGES/DRESSINGS) ×1 IMPLANT
DRAPE ARM DVNC X/XI (DISPOSABLE) ×4 IMPLANT
DRAPE COLUMN DVNC XI (DISPOSABLE) ×1 IMPLANT
ELECT REM PT RETURN 9FT ADLT (ELECTROSURGICAL) ×1
ELECTRODE REM PT RTRN 9FT ADLT (ELECTROSURGICAL) ×1 IMPLANT
FORCEPS BPLR R/ABLATION 8 DVNC (INSTRUMENTS) ×1 IMPLANT
FORCEPS PROGRASP DVNC XI (FORCEP) ×1 IMPLANT
GAUZE 4X4 16PLY ~~LOC~~+RFID DBL (SPONGE) IMPLANT
GLOVE BIO SURGEON STRL SZ 6.5 (GLOVE) ×2 IMPLANT
GLOVE BIO SURGEON STRL SZ7 (GLOVE) IMPLANT
GLOVE BIOGEL PI IND STRL 6.5 (GLOVE) ×2 IMPLANT
GLOVE BIOGEL PI IND STRL 7.0 (GLOVE) ×2 IMPLANT
GLOVE BIOGEL PI IND STRL 7.5 (GLOVE) IMPLANT
GOWN STRL REUS W/TWL LRG LVL3 (GOWN DISPOSABLE) ×4 IMPLANT
GRASPER SUT TROCAR 14GX15 (MISCELLANEOUS) IMPLANT
HEMOSTAT SNOW SURGICEL 2X4 (HEMOSTASIS) IMPLANT
IRRIGATOR SUCT 8 DISP DVNC XI (IRRIGATION / IRRIGATOR) IMPLANT
IV NS IRRIG 3000ML ARTHROMATIC (IV SOLUTION) ×1 IMPLANT
KIT TURNOVER KIT A (KITS) ×1 IMPLANT
MANIFOLD NEPTUNE II (INSTRUMENTS) IMPLANT
NDL HYPO 21X1.5 SAFETY (NEEDLE) ×1 IMPLANT
NDL INSUFFLATION 14GA 120MM (NEEDLE) ×1 IMPLANT
NEEDLE HYPO 21X1.5 SAFETY (NEEDLE) ×1 IMPLANT
NEEDLE INSUFFLATION 14GA 120MM (NEEDLE) ×1 IMPLANT
OBTURATOR OPTICAL STND 8 DVNC (TROCAR) ×1
OBTURATOR OPTICALSTD 8 DVNC (TROCAR) ×1 IMPLANT
PACK LAP CHOLE LZT030E (CUSTOM PROCEDURE TRAY) ×1 IMPLANT
PAD ARMBOARD 7.5X6 YLW CONV (MISCELLANEOUS) ×1 IMPLANT
PENCIL HANDSWITCHING (ELECTRODE) ×1 IMPLANT
SCISSORS MNPLR CVD DVNC XI (INSTRUMENTS) ×1 IMPLANT
SEAL CANN UNIV 5-8 DVNC XI (MISCELLANEOUS) ×4 IMPLANT
SET BASIN LINEN APH (SET/KITS/TRAYS/PACK) ×1 IMPLANT
SET TUBE SMOKE EVAC HIGH FLOW (TUBING) ×1 IMPLANT
SUT MNCRL AB 4-0 PS2 18 (SUTURE) ×2 IMPLANT
SUT VIC AB 3-0 SH 27 (SUTURE) ×1
SUT VIC AB 3-0 SH 27X BRD (SUTURE) IMPLANT
SUT VICRYL 0 AB UR-6 (SUTURE) IMPLANT
SUT VICRYL 0 UR6 27IN ABS (SUTURE) IMPLANT
SYR 30ML LL (SYRINGE) ×1 IMPLANT
SYS RETRIEVAL 5MM INZII UNIV (BASKET) ×1
SYSTEM RETRIEVL 5MM INZII UNIV (BASKET) ×1 IMPLANT
TUBE CONNECTING 12X1/4 (SUCTIONS) IMPLANT
WATER STERILE IRR 500ML POUR (IV SOLUTION) ×1 IMPLANT
YANKAUER SUCT BULB TIP NO VENT (SUCTIONS) IMPLANT

## 2022-06-27 NOTE — Anesthesia Preprocedure Evaluation (Addendum)
Anesthesia Evaluation  Patient identified by MRN, date of birth, ID band Patient awake    Reviewed: Allergy & Precautions, H&P , NPO status , Patient's Chart, lab work & pertinent test results  Airway Mallampati: II  TM Distance: >3 FB Neck ROM: Full    Dental  (+) Missing, Caps, Dental Advisory Given   Pulmonary neg pulmonary ROS   Pulmonary exam normal breath sounds clear to auscultation       Cardiovascular Exercise Tolerance: Good hypertension, Pt. on medications Normal cardiovascular exam Rhythm:Regular Rate:Normal     Neuro/Psych  PSYCHIATRIC DISORDERS Anxiety Depression    negative neurological ROS     GI/Hepatic Neg liver ROS, hiatal hernia,GERD  Medicated and Controlled,,  Endo/Other  Hypothyroidism    Renal/GU negative Renal ROS  negative genitourinary   Musculoskeletal  (+) Arthritis , Osteoarthritis,    Abdominal   Peds negative pediatric ROS (+)  Hematology  (+) Blood dyscrasia, anemia   Anesthesia Other Findings   Reproductive/Obstetrics negative OB ROS                             Anesthesia Physical Anesthesia Plan  ASA: 2  Anesthesia Plan: General   Post-op Pain Management: Dilaudid IV   Induction: Intravenous  PONV Risk Score and Plan: 4 or greater and Ondansetron and Dexamethasone  Airway Management Planned: Oral ETT  Additional Equipment:   Intra-op Plan:   Post-operative Plan: Extubation in OR  Informed Consent: I have reviewed the patients History and Physical, chart, labs and discussed the procedure including the risks, benefits and alternatives for the proposed anesthesia with the patient or authorized representative who has indicated his/her understanding and acceptance.     Dental advisory given  Plan Discussed with: CRNA and Surgeon  Anesthesia Plan Comments:        Anesthesia Quick Evaluation

## 2022-06-27 NOTE — H&P (Signed)
[facility] History and Physical  Reason for Referral: gallstone    Leslie Robbins is a 67 y.o. female.  HPI: Patient known to me who was found to have a large gallstone on her imaging. She was having pretty severe abdominal pain and we knew she had stones but given the severity we obtained a CT to ensure we were not missing anything. She also had some anemia that has resolved which was all reassuring. She is due for a colonoscopy she reports.  Past Medical History:  Diagnosis Date   Anxiety    Arthritis    Depression    GERD (gastroesophageal reflux disease)    History of hiatal hernia    Hypercholesteremia    Hypertension    Hypothyroidism     Past Surgical History:  Procedure Laterality Date   ABDOMINAL HYSTERECTOMY     BREAST LUMPECTOMY     benign   CARDIAC CATHETERIZATION     9 YRS AGO   COLONOSCOPY N/A 05/30/2012   Procedure: COLONOSCOPY;  Surgeon: Malissa Hippo, MD;  Location: [facility];  Service: Endoscopy;  Laterality: N/A;  830-moved to 1125 Ann to notify pt   DILATION AND CURETTAGE OF UTERUS     X2   JOINT REPLACEMENT Right    right hip   Right Carpal Tunnel Release     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   Procedure: LEFT TOTAL KNEE ARTHROPLASTY;  Surgeon: Loanne Drilling, MD;  Location: [facility];  Service: Orthopedics;  Laterality: Left;   TOTAL SHOULDER ARTHROPLASTY Right 06/17/2016   TOTAL SHOULDER ARTHROPLASTY Right 06/17/2016   Procedure: RIGHT TOTAL SHOULDER ARTHROPLASTY;  Surgeon: Beverely Low, MD;  Location: [facility] OR;  Service: Orthopedics;  Laterality: Right;   TUBAL LIGATION      Family History  Problem Relation Age of Onset   Dementia Mother    Hypertension Mother    Lupus Mother    Fibromyalgia Sister    Depression Sister    Bipolar disorder Daughter    Drug abuse Daughter    Breast cancer Maternal Aunt    Breast cancer Cousin    Breast cancer Cousin    Colon cancer Neg Hx     Social History   Tobacco Use   Smoking  status: Never   Smokeless tobacco: Never  Vaping Use   Vaping Use: Never used  Substance Use Topics   Alcohol use: No   Drug use: No    Medications: I have reviewed the patient's current medications. Medications Prior to Admission  Medication Sig Dispense Refill   acetaminophen (TYLENOL) 650 MG CR tablet Take 1,300 mg by mouth every 8 (eight) hours as needed for pain.     albuterol (VENTOLIN HFA) 108 (90 Base) MCG/ACT inhaler INHALE 2 PUFFS BY MOUTH EVERY 6 HOURS AS NEEDED FOR WHEEZE OR SHORTNESS OF BREATH 8.5 each 2   amLODipine (NORVASC) 5 MG tablet Take 1 tablet (5 mg total) by mouth daily. (NEEDS TO BE SEEN BEFORE NEXT REFILL) 30 tablet 0   aspirin EC 81 MG tablet Take 81 mg by mouth daily.     clorazepate (TRANXENE) 3.75 MG tablet Take 1 tablet (3.75 mg total) by mouth at bedtime as needed for anxiety. 30 tablet 5   DULoxetine (CYMBALTA) 60 MG capsule Take 1 capsule (60 mg total) by mouth 2 (two) times daily. (NEEDS TO BE SEEN BEFORE NEXT REFILL) 60 capsule 0   estradiol (ESTRACE) 0.1 MG/GM vaginal cream PLACE 2 GRAMS VAGINALLY AS  DIRECTED EVERY OTHER NIGHT (Patient taking differently: Place 1 Applicatorful vaginally every 3 (three) days.) 42.5 g 12   ferrous sulfate (FE TABS) 325 (65 FE) MG EC tablet Take 1 tablet (325 mg total) by mouth daily with breakfast. (Patient taking differently: Take 325 mg by mouth every Monday, Wednesday, and Friday.) 90 tablet 3   fexofenadine (ALLEGRA) 180 MG tablet Take 1 tablet (180 mg total) by mouth daily. For allergy symptoms (Patient taking differently: Take 180 mg by mouth daily as needed for allergies.) 30 tablet 11   fluticasone (FLONASE) 50 MCG/ACT nasal spray Place 2 sprays into both nostrils daily. 16 g 6   levothyroxine (SYNTHROID) 125 MCG tablet Take 1 tablet (125 mcg total) by mouth daily. (NEEDS TO BE SEEN BEFORE NEXT REFILL) 30 tablet 0   meloxicam (MOBIC) 15 MG tablet Take 1 tablet (15 mg total) by mouth daily. 90 tablet 3   Omega-3 Fatty  Acids (FISH OIL PO) Take 1 capsule by mouth daily.     omeprazole (PRILOSEC) 40 MG capsule TAKE 1 CAPSULE (40 MG TOTAL) BY MOUTH DAILY. (Patient taking differently: Take 40 mg by mouth at bedtime.) 90 capsule 0   polyethylene glycol powder (GLYCOLAX/MIRALAX) 17 GM/SCOOP powder Take 17 g by mouth 2 (two) times daily as needed for moderate constipation. For constipation 3350 g 5   rosuvastatin (CRESTOR) 20 MG tablet Take 1 tablet (20 mg total) by mouth daily. (NEEDS TO BE SEEN BEFORE NEXT REFILL) 30 tablet 0   triamterene-hydrochlorothiazide (MAXZIDE-25) 37.5-25 MG tablet Take 1 tablet by mouth daily. As needed for swelling (NEEDS TO BE SEEN BEFORE NEXT REFILL) 30 tablet 0    Allergies  Allergen Reactions   Sulfa Antibiotics Rash   Seroquel [Quetiapine] Other (See Comments)    Seroquel anger, hostility, increased anxiety   Sulfonamide Derivatives Rash    SULFONAMIDES INCREASE SENSITIVITY   TO SUNLIGHT > MORE LIKELY TO SUNBURN SUNBURN-LIKE REDNESS  \   ROS:  A comprehensive review of systems was negative except for: Gastrointestinal: positive for abdominal pain and nausea Musculoskeletal: positive for back pain and neck pain Neurological: positive for dizziness  Blood pressure 131/85, temperature 98 F (36.7 C), temperature source Oral, resp. rate 19, height  (1.626 m), weight 87.5 kg, SpO2 100 %. Physical Exam Vitals reviewed.  HENT:     Head: Normocephalic.     Nose: Nose normal.  Cardiovascular:     Rate and Rhythm: Normal rate.  Pulmonary:     Effort: Pulmonary effort is normal.  Abdominal:     General: There is no distension.     Palpations: Abdomen is soft.  Musculoskeletal:        General: Normal range of motion.  Skin:    General: Skin is warm.  Neurological:     General: No focal deficit present.     Mental Status: She is alert and oriented to person, place, and time.     Results: CT with large gallstone   Assessment & Plan:  Leslie Robbins is a 67 y.o.  female with gallstones.    PLAN: I counseled the patient about the indication, risks and benefits of robotic assisted laparoscopic cholecystectomy.  She understands there is a very small chance for bleeding, infection, injury to normal structures (including common bile duct), conversion to open surgery, persistent symptoms, evolution of postcholecystectomy diarrhea, need for secondary interventions, anesthesia reaction, cardiopulmonary issues and other risks not specifically detailed here. I described the expected recovery, the plan for follow-up  and the restrictions during the recovery phase.  All questions were answered.   All questions were answered to the satisfaction of the patient.   Lucretia Roers 06/27/2022, 10:20 AM

## 2022-06-27 NOTE — Anesthesia Procedure Notes (Signed)
Procedure Name: Intubation Date/Time: 06/27/2022 10:38 AM  Performed by: Julian Reil, CRNAPre-anesthesia Checklist: Patient identified, Emergency Drugs available, Suction available and Patient being monitored Patient Re-evaluated:Patient Re-evaluated prior to induction Oxygen Delivery Method: Circle system utilized Preoxygenation: Pre-oxygenation with 100% oxygen Induction Type: IV induction Ventilation: Mask ventilation without difficulty and Oral airway inserted - appropriate to patient size Laryngoscope Size: Miller and 3 Grade View: Grade II Tube type: Oral Tube size: 7.5 mm Number of attempts: 1 Airway Equipment and Method: Stylet and Oral airway Placement Confirmation: ETT inserted through vocal cords under direct vision, positive ETCO2 and breath sounds checked- equal and bilateral Secured at: 23 cm Tube secured with: Tape Dental Injury: Teeth and Oropharynx as per pre-operative assessment  Comments: 4x4s bite block used.

## 2022-06-27 NOTE — Transfer of Care (Signed)
Immediate Anesthesia Transfer of Care Note  Patient: Leslie Robbins  Procedure(s) Performed: XI ROBOTIC ASSISTED LAPAROSCOPIC CHOLECYSTECTOMY (Abdomen)  Patient Location: PACU  Anesthesia Type:General  Level of Consciousness: awake  Airway & Oxygen Therapy: Patient Spontanous Breathing and Patient connected to face mask oxygen  Post-op Assessment: Report given to RN and Post -op Vital signs reviewed and stable  Post vital signs: Reviewed and stable  Last Vitals:  Vitals Value Taken Time  BP    Temp    Pulse 86 06/27/22 1234  Resp 18 06/27/22 1234  SpO2 100 % 06/27/22 1234  Vitals shown include unvalidated device data.  Last Pain:  Vitals:   06/27/22 0908  TempSrc: Oral  PainSc: 0-No pain      Patients Stated Pain Goal: 8 (06/27/22 0908)  Complications: No notable events documented.

## 2022-06-27 NOTE — Discharge Instructions (Signed)
Do not take your Aspirin, Mobic, Other NSAIDs (aleve, motrin, ibuprofen, BC powder) or your fish oil for the next 7 days.    Discharge Robotic Assisted Laparoscopic Surgery Instructions:  Common Complaints: Right shoulder pain is common after laparoscopic surgery.  This is secondary to the gas used in the surgery being trapped under the diaphragm.  Walk to help your body absorb the gas. This will improve in a few days. Pain at the port sites are common, especially the larger port sites. This will improve with time.  Some nausea is common and poor appetite. The main goal is to stay hydrated the first few days after surgery.   Diet/ Activity: Diet as tolerated. You may not have an appetite, but it is important to stay hydrated.  Drink 64 ounces of water a day. Your appetite will return with time.  Shower per your regular routine daily.  Do not take hot showers. Take warm showers that are less than 10 minutes. Rest and listen to your body, but do not remain in bed all day.  Walk everyday for at least 15-20 minutes. Deep cough and move around every 1-2 hours in the first few days after surgery.  Do not lift > 10 lbs, perform excessive bending, pushing, pulling, squatting for 1-2 weeks after surgery.  Do not pick at the dermabond glue on your incision sites.  This glue film will remain in place for 1-2 weeks and will start to peel off.  Do not place lotions or balms on your incision unless instructed to specifically by Dr. Henreitta Leber.   Pain Expectations and Narcotics: -After surgery you will have pain associated with your incisions and this is normal. The pain is muscular and nerve pain, and will get better with time. -You are encouraged and expected to take non narcotic medications like tylenol and ibuprofen (when able) to treat pain as multiple modalities can aid with pain treatment. -Narcotics are only used when pain is severe or there is breakthrough pain. -You are not expected to have a pain  score of 0 after surgery, as we cannot prevent pain. A pain score of 3-4 that allows you to be functional, move, walk, and tolerate some activity is the goal. The pain will continue to improve over the days after surgery and is dependent on your surgery. -Due to [place] law, we are only able to give a certain amount of pain medication to treat post operative pain, and we only give additional narcotics on a patient by patient basis.  -For most laparoscopic surgery, studies have shown that the majority of patients only need 10-15 narcotic pills, and for open surgeries most patients only need 15-20.   -Having appropriate expectations of pain and knowledge of pain management with non narcotics is important as we do not want anyone to become addicted to narcotic pain medication.  -Using ice packs in the first 48 hours and heating pads after 48 hours, wearing an abdominal binder (when recommended), and using over the counter medications are all ways to help with pain management.   -Simple acts like meditation and mindfulness practices after surgery can also help with pain control and research has proven the benefit of these practices.  Medication: Take tylenol. Tylenol  @ 6am, 12noon, 6pm, (Do not exceed  of tylenol a day). Take Roxicodone for breakthrough pain every 4 hours.  Take Colace for constipation related to narcotic pain medication. If you do not have a bowel movement in 2 days, take Miralax over  the counter.  Drink plenty of water to also prevent constipation.   Contact Information: If you have questions or concerns, please call our office, [phone], Monday- Thursday 8AM-5PM and Friday 8AM-12Noon.  If it is after hours or on the weekend, please call [facility] Main [facility], [phone], [phone], and ask to speak to the surgeon on call for Dr. Henreitta Leber at [facility].

## 2022-06-27 NOTE — Op Note (Signed)
[facility] Operative Note  06/27/22  Preoperative Diagnosis: Symptomatic Cholelithiasis   Postoperative Diagnosis: Same   Procedure(s) Performed: Robotic Assisted Laparoscopic Cholecystectomy   Surgeon: Leatrice Jewels. Henreitta Leber, MD   Assistants: No qualified resident was available    Anesthesia: General endotracheal   Anesthesiologist: Molli Barrows, MD    Specimens: Gallbladder   Estimated Blood Loss: 30cc   Blood Replacement: None    Complications: None   Wound Class: Clean contaminated   Operative Indications: The patient was found to have gallstones on imaging and was symptomatic.  We discussed the risk of the procedure including but not limited to bleeding, infection, injury to the common bile duct, bile leak, need for further procedures, chance of subtotal cholecystectomy.   Findings:  Large lymph node just lateral and inferior to the common duct, unable to sample or remove Critical view of safety noted All clips intact at the end of the case Adequate hemostasis, Very oozy in general, Surgical Snow placed  Evacuated hematoma at umbilical incision after initial closure, had to reopen and close again, ensure hemostasis    Procedure: Firefly was given in the preoperative area. The patient was taken to the operating room and placed supine. General endotracheal anesthesia was induced. Intravenous antibiotics were  administered per protocol.  An orogastric tube positioned to decompress the stomach. The abdomen was prepared and draped in the usual sterile fashion.   Veress needle was placed at the supraumbilical area and insufflation was started after confirming a positive saline drop test and no immediate increase in abdominal pressure.  After reaching 15 mm, the Veress needle was removed and a 8 mm port was placed via optiview technique supraumbilical, measuring 20 mm away from the suspected position of the gallbladder.  The abdomen was inspected and no  abnormalities or injuries were found.  Under direct vision, ports were placed in the following locations in a semi curvilinear position around the target of the gallbladder: Two 8 mm ports on the patient's right each having 8cm clearance to the adjacent ports and one 8 mm port placed on the patient's left 8 cm from the umbilical port. Once ports were placed, the table was placed in the reverse Trendelenburg position with the right side up. The Xi platform was brought into the operative field and docked to the ports successfully.  An endoscope was placed through the umbilical port, prograsper through the most lateral right port, forced bipolar to the port just right of the umbilicus, and then a hook cautery in the left port.   The dome of the gallbladder was grasped with prograsp and retracted over the dome of the liver. Adhesions between the gallbladder and omentum, duodenum and transverse colon were lysed via hook cautery. The infundibulum was grasped with the fenestrated grasper and retracted toward the right lower quadrant. This maneuver exposed Calot's triangle. Firefly was used throughout the dissection to ensure safe visualization of the cystic duct.The peritoneum overlying the gallbladder infundibulum was then dissected and the cystic duct and cystic artery identified.  Critical view of safety with the liver bed clearly visible behind the duct and artery with no additional structures noted.  there was a large lymph node inferior and posterior to the common bile duct. I could not get to this safely to remove. She had a CT preoperatively that did not see any adenopathy or concerning feature. The cystic duct and cystic artery were doubly clipped and divided close to the gallbladder.  She had generalized oozing the  entire case.   The gallbladder was then dissected from its peritoneal and liver bed attachments by electrocautery. I used electrocautery and suctioned out the bleeding.  Hemostasis was checked  prior to removing the cautery.   A 5mm Endo Catch bag was then placed through the left side port and the gallbladder was removed.  The [employer] was undocked and moved out of the field.  The gallbladder was passed off the table as a specimen. There was no evidence of bleeding from the gallbladder fossa or cystic artery or leakage of the bile from the cystic duct stump. Surgical Jamelle Haring was placed due to the generalized oozing.  The abdomen was desufflated and secondary trocars were removed under direct vision.  No bleeding was noted on removal of these. The left sided port site closed with a PMI and 0 vicryl.  No bleeding was noted. All skin incisions were closed with subcuticular sutures of 4-0 monocryl.   I then was about to get the dermabond and noted a lemon sized hematoma just the right of midline at the umbilicus site. I opened the site back up and evacuated a hematoma. I could find no source of bleeding just generalized oozing. I did opt to close the fascia to ensure nothing was bleeding. I grasped the edges with a kocher and closed the fascia with 0 Vicryl. I closed the subcutaneous tissue with 3-0 Vicryl after ensuring hemostasis. The skin was again closed with 4-0 Monocryl and dermabond was applied.  She had generalized oozing during the case but everything was hemostatic at the completion.  Final inspection revealed acceptable hemostasis. All counts were correct at the end of the case. The patient was awakened from anesthesia and extubated without complication. The OG tube was removed.  The patient went to the PACU in stable condition.   Algis Greenhouse, MD [facility] [address] [place], [place] [address] [phone] (office)

## 2022-06-27 NOTE — Anesthesia Postprocedure Evaluation (Signed)
Anesthesia Post Note  Patient: Leslie Robbins  Procedure(s) Performed: XI ROBOTIC ASSISTED LAPAROSCOPIC CHOLECYSTECTOMY (Abdomen)  Patient location during evaluation: Phase II Anesthesia Type: General Level of consciousness: awake and alert and oriented Pain management: pain level controlled Vital Signs Assessment: post-procedure vital signs reviewed and stable Respiratory status: spontaneous breathing, nonlabored ventilation and respiratory function stable Cardiovascular status: blood pressure returned to baseline and stable Postop Assessment: no apparent nausea or vomiting Anesthetic complications: no  No notable events documented.   Last Vitals:  Vitals:   06/27/22 1415 06/27/22 1440  BP: 134/75 (!) 141/86  Pulse: 88 87  Resp: 12   Temp:  (!) 36.3 C  SpO2: 99% 95%    Last Pain:  Vitals:   06/27/22 1440  TempSrc: Axillary  PainSc: 3                  Raygan Skarda C Yossef Gilkison

## 2022-06-28 LAB — SURGICAL PATHOLOGY

## 2022-07-13 ENCOUNTER — Ambulatory Visit (INDEPENDENT_AMBULATORY_CARE_PROVIDER_SITE_OTHER): Payer: Medicare HMO | Admitting: General Surgery

## 2022-07-13 DIAGNOSIS — K8020 Calculus of gallbladder without cholecystitis without obstruction: Secondary | ICD-10-CM

## 2022-07-13 NOTE — Progress Notes (Signed)
[facility]  I am calling the patient for post operative evaluation. This is not a billable encounter as it is under the global charges for the surgery.  The patient had a robotic assisted cholecystectomy on 4/22. The patient reports that she is doing ok but is more sore at the umbilical area where she had a hematoma evacuated during the end of surgery. She stretches some and this helps. If she sits and drives then that hurts the area more. The are tolerating a diet, having good pain control, and having regular Bms.  The incisions are healing well.   She did have a spell of epigastric pain with fried fish and this caused her symptoms.   Told her to tell us if the umbilical pain is not getting better.  She has noticed some RUQ pain on occasion but this is "light pain" but nothing major. Told her to watch this and let us know if any issues.   She is taking miralax Bid and this is helping her go to the bathroom with her issues with constipation.   Pathology: A. GALLBLADDER, CHOLECYSTECTOMY:  - Chronic cholecystitis and cholelithiasis  - Benign lymph node   Will see the patient PRN.   Algis Greenhouse, MD [facility] [address] [place], [place] [address] [phone] (office)

## 2022-07-13 NOTE — Progress Notes (Deleted)
Referring Provider: Mechele Claude, MD Primary Care Physician:  Mechele Claude, MD Primary GI Physician: Dr. Levon Hedger  No chief complaint on file.   HPI:   Leslie Robbins is a 67 y.o. female with history of anxiety, depression, HTN, HLD, hypothyroidism, GERD, adenomatous colon polyps, currently due for surveillance colonoscopy, presenting today for follow-up of abdominal pain, constipation, IDA, and to discuss scheduling procedures.  Last seen in our office 06/02/2022.  She reported intermittent abdominal pain in RUQ, LUQ, lower abdomen.  RUQ pain sometimes affected by fatty meals, but not always.  Pain was worse on days that she was not moving her bowels.  Chronic GERD was well-controlled.  Had recently completed a CT scan, but result was pending.  Noted patient was found to have iron deficiency anemia in November 2023 with hemoglobin down to 8.5.  She started oral iron, and hemoglobin normalized.  FOBT was negative in March 2024.  Admitted to daily NSAID use.  Reported some rectal bleeding in the setting of hemorrhoids and dark stools with iron.  Plan included following up on CT result, if no acute abnormalities, proceed with EGD and colonoscopy, continue omeprazole, minimize meloxicam is much as possible and avoid all other NSAIDs, start MiraLAX 17 g daily.   CT without acute abnormalities.  She did have a large gallstone.  She had seen Dr. Henreitta Leber and was scheduled for cholecystectomy.  Offered EGD and colonoscopy prior to cholecystectomy or 1 month after cholecystectomy.  Patient preferred to wait until after cholecystectomy.  Patient reported incomplete bowel movements with MiraLAX daily on 4/15.  Recommended increasing MiraLAX to twice daily.  Cholecystectomy 06/27/2022.   Today:    Past Medical History:  Diagnosis Date   Anxiety    Arthritis    Depression    GERD (gastroesophageal reflux disease)    History of hiatal hernia    Hypercholesteremia    Hypertension     Hypothyroidism     Past Surgical History:  Procedure Laterality Date   ABDOMINAL HYSTERECTOMY     BREAST LUMPECTOMY     benign   CARDIAC CATHETERIZATION     9 YRS AGO   COLONOSCOPY N/A 05/30/2012   Procedure: COLONOSCOPY;  Surgeon: Malissa Hippo, MD;  [facility];  Service: Endoscopy;  Laterality: N/A;  830-moved to 1125 Ann to notify pt   DILATION AND CURETTAGE OF UTERUS     X2   JOINT REPLACEMENT Right    right hip   Right Carpal Tunnel Release     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   Procedure: LEFT TOTAL KNEE ARTHROPLASTY;  Surgeon: Loanne Drilling, MD;  Location: [facility];  Service: Orthopedics;  Laterality: Left;   TOTAL SHOULDER ARTHROPLASTY Right 06/17/2016   TOTAL SHOULDER ARTHROPLASTY Right 06/17/2016   Procedure: RIGHT TOTAL SHOULDER ARTHROPLASTY;  Surgeon: Beverely Low, MD;  Location: [facility];  Service: Orthopedics;  Laterality: Right;   TUBAL LIGATION      Current Outpatient Medications  Medication Sig Dispense Refill   acetaminophen (TYLENOL) 650 MG CR tablet Take 1,300 mg by mouth every 8 (eight) hours as needed for pain.     albuterol (VENTOLIN HFA) 108 (90 Base) MCG/ACT inhaler INHALE 2 PUFFS BY MOUTH EVERY 6 HOURS AS NEEDED FOR WHEEZE OR SHORTNESS OF BREATH 8.5 each 2   amLODipine (NORVASC) 5 MG tablet Take 1 tablet (5 mg total) by mouth daily. (NEEDS TO BE SEEN BEFORE NEXT REFILL) 30 tablet 0   aspirin EC 81 MG  tablet Take 1 tablet (81 mg total) by mouth daily. 30 tablet 11   clorazepate (TRANXENE) 3.75 MG tablet Take 1 tablet (3.75 mg total) by mouth at bedtime as needed for anxiety. 30 tablet 5   DULoxetine (CYMBALTA) 60 MG capsule Take 1 capsule (60 mg total) by mouth 2 (two) times daily. (NEEDS TO BE SEEN BEFORE NEXT REFILL) 60 capsule 0   estradiol (ESTRACE) 0.1 MG/GM vaginal cream PLACE 2 GRAMS VAGINALLY AS DIRECTED EVERY OTHER NIGHT (Patient taking differently: Place 1 Applicatorful vaginally every 3 (three) days.) 42.5 g 12   ferrous sulfate  (FE TABS) 325 (65 FE) MG EC tablet Take 1 tablet (325 mg total) by mouth daily with breakfast. (Patient taking differently: Take 325 mg by mouth every Monday, Wednesday, and Friday.) 90 tablet 3   fexofenadine (ALLEGRA) 180 MG tablet Take 1 tablet (180 mg total) by mouth daily. For allergy symptoms (Patient taking differently: Take 180 mg by mouth daily as needed for allergies.) 30 tablet 11   fluticasone (FLONASE) 50 MCG/ACT nasal spray Place 2 sprays into both nostrils daily. 16 g 6   levothyroxine (SYNTHROID) 125 MCG tablet Take 1 tablet (125 mcg total) by mouth daily. (NEEDS TO BE SEEN BEFORE NEXT REFILL) 30 tablet 0   meloxicam (MOBIC) 15 MG tablet Take 1 tablet (15 mg total) by mouth daily. 90 tablet 3   Omega-3 Fatty Acids (FISH OIL) 300 MG CAPS Take 1 capsule (300 mg total) by mouth daily.     omeprazole (PRILOSEC) 40 MG capsule TAKE 1 CAPSULE (40 MG TOTAL) BY MOUTH DAILY. (Patient taking differently: Take 40 mg by mouth at bedtime.) 90 capsule 0   ondansetron (ZOFRAN) 4 MG tablet Take 1 tablet (4 mg total) by mouth every 8 (eight) hours as needed. 30 tablet 1   oxyCODONE (ROXICODONE) 5 MG immediate release tablet Take 1 tablet (5 mg total) by mouth every 4 (four) hours as needed for severe pain or breakthrough pain. 15 tablet 0   polyethylene glycol powder (GLYCOLAX/MIRALAX) 17 GM/SCOOP powder Take 17 g by mouth 2 (two) times daily as needed for moderate constipation. For constipation 3350 g 5   rosuvastatin (CRESTOR) 20 MG tablet Take 1 tablet (20 mg total) by mouth daily. (NEEDS TO BE SEEN BEFORE NEXT REFILL) 30 tablet 0   triamterene-hydrochlorothiazide (MAXZIDE-25) 37.5-25 MG tablet Take 1 tablet by mouth daily. As needed for swelling (NEEDS TO BE SEEN BEFORE NEXT REFILL) 30 tablet 0   No current facility-administered medications for this visit.    Allergies as of 07/14/2022 - Review Complete 06/27/2022  Allergen Reaction Noted   Sulfa antibiotics Rash 09/16/2021   Seroquel  [quetiapine] Other (See Comments) 03/30/2021   Sulfonamide derivatives Rash     Family History  Problem Relation Age of Onset   Dementia Mother    Hypertension Mother    Lupus Mother    Fibromyalgia Sister    Depression Sister    Bipolar disorder Daughter    Drug abuse Daughter    Breast cancer Maternal Aunt    Breast cancer Cousin    Breast cancer Cousin    Colon cancer Neg Hx     Social History   Socioeconomic History   Marital status: Married    Spouse name: Casimiro Needle   Number of children: 3   Years of education: Not on file   Highest education level: Not on file  Occupational History   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  Tobacco Use  Smoking status: Never   Smokeless tobacco: Never  Vaping Use   Vaping Use: Never used  Substance and Sexual Activity   Alcohol use: No   Drug use: No   Sexual activity: Yes    Birth control/protection: Surgical    Comment: hyst  Other Topics Concern   Not on file  Social History Narrative   3 grandchildren live with her and her husband full time - she has been their full time caregiver since 2019   One of her daughters lives nearby   Social Determinants of Health   Financial Resource Strain: Low Risk  (06/21/2022)   Overall Financial Resource Strain (CARDIA)    Difficulty of Paying Living Expenses: Not hard at all  Food Insecurity: No Food Insecurity (06/21/2022)   Hunger Vital Sign    Worried About Running Out of Food in the Last Year: Never true    Ran Out of Food in the Last Year: Never true  Transportation Needs: No Transportation Needs (06/21/2022)   PRAPARE - Administrator, Civil Service (Medical): No    Lack of Transportation (Non-Medical): No  Physical Activity: Insufficiently Active (06/21/2022)   Exercise Vital Sign    Days of Exercise per Week: 3 days    Minutes of Exercise per Session: 30 min  Stress: No Stress Concern Present (06/21/2022)   [employer] of Occupational Health -  Occupational Stress Questionnaire    Feeling of Stress : Not at all  Social Connections: Moderately Isolated (06/21/2022)   Social Connection and Isolation Panel [NHANES]    Frequency of Communication with Friends and Family: More than three times a week    Frequency of Social Gatherings with Friends and Family: More than three times a week    Attends Religious Services: Never    Database administrator or Organizations: No    Attends Banker Meetings: Never    Marital Status: Married    Review of Systems: Gen: Denies fever, chills, anorexia. Denies fatigue, weakness, weight loss.  CV: Denies chest pain, palpitations, syncope, peripheral edema, and claudication. Resp: Denies dyspnea at rest, cough, wheezing, coughing up blood, and pleurisy. GI: Denies vomiting blood, jaundice, and fecal incontinence.   Denies dysphagia or odynophagia. Derm: Denies rash, itching, dry skin Psych: Denies depression, anxiety, memory loss, confusion. No homicidal or suicidal ideation.  Heme: Denies bruising, bleeding, and enlarged lymph nodes.  Physical Exam: There were no vitals taken for this visit. General:   Alert and oriented. No distress noted. Pleasant and cooperative.  Head:  Normocephalic and atraumatic. Eyes:  Conjuctiva clear without scleral icterus. Heart:  S1, S2 present without murmurs appreciated. Lungs:  Clear to auscultation bilaterally. No wheezes, rales, or rhonchi. No distress.  Abdomen:  +BS, soft, non-tender and non-distended. No rebound or guarding. No HSM or masses noted. Msk:  Symmetrical without gross deformities. Normal posture. Extremities:  Without edema. Neurologic:  Alert and  oriented x4 Psych:  Normal mood and affect.    Assessment:     Plan:  ***   Ermalinda Memos, PA-C [facility] 07/14/2022

## 2022-07-14 ENCOUNTER — Encounter: Payer: Self-pay | Admitting: Gastroenterology

## 2022-07-14 ENCOUNTER — Ambulatory Visit: Payer: Medicare HMO | Admitting: Gastroenterology

## 2022-07-18 ENCOUNTER — Other Ambulatory Visit: Payer: Self-pay | Admitting: Family Medicine

## 2022-07-18 DIAGNOSIS — F418 Other specified anxiety disorders: Secondary | ICD-10-CM

## 2022-07-18 DIAGNOSIS — E785 Hyperlipidemia, unspecified: Secondary | ICD-10-CM

## 2022-07-18 DIAGNOSIS — I10 Essential (primary) hypertension: Secondary | ICD-10-CM

## 2022-07-24 NOTE — Progress Notes (Unsigned)
Referring Provider: Mechele Claude, MD Primary Care Physician:  Mechele Claude, MD Primary GI Physician: Dr. Levon Hedger  Chief Complaint  Patient presents with   Follow-up    Follow up. No problems    HPI:   Leslie Robbins is a 67 y.o. female presenting today with a history of with history of anxiety, depression, HTN, HLD, hypothyroidism, GERD, adenomatous colon polyps, currently due for surveillance colonoscopy, presenting today for follow-up of abdominal pain, constipation, IDA, and to discuss scheduling procedures.   Last seen in our office 06/02/2022.  She reported intermittent abdominal pain in RUQ, LUQ, lower abdomen.  RUQ pain sometimes affected by fatty meals, but not always.  Pain was worse on days that she was not moving her bowels.  Chronic GERD was well-controlled.  Had recently completed a CT scan, but result was pending.  Noted patient was found to have iron deficiency anemia in November 2023 with hemoglobin down to 8.5.  She started oral iron, and hemoglobin normalized.  FOBT was negative in March 2024.  Admitted to daily NSAID use.  Reported some rectal bleeding in the setting of hemorrhoids and dark stools with iron.  Plan included following up on CT result, if no acute abnormalities, proceed with EGD and colonoscopy, continue omeprazole, minimize meloxicam is much as possible and avoid all other NSAIDs, start MiraLAX 17 g daily.    CT without acute abnormalities.  She did have a large gallstone.  She had seen Dr. Henreitta Leber and was scheduled for cholecystectomy.  Offered EGD and colonoscopy prior to cholecystectomy or 1 month after cholecystectomy.  Patient preferred to wait until after cholecystectomy.   Patient reported incomplete bowel movements with MiraLAX daily on 4/15.  Recommended increasing MiraLAX to twice daily.   Cholecystectomy 06/27/2022.     Today:  Reports she is feeling much better since having her gallbladder removed.  Prior abdominal pain has resolved.   Reflux is well-controlled on omeprazole 40 mg daily.  Occasional indigestion if eating something that she should not such as fried fish.  No nausea, vomiting, dysphagia.  Bowels are moving well with MiraLAX twice daily.  No BRBPR or melena.  She did discontinue meloxicam.  Still taking iron 3 times a week.   Going to PCP for physical on 5/29. Will have blood work then.    Past Medical History:  Diagnosis Date   Anxiety    Arthritis    Depression    GERD (gastroesophageal reflux disease)    History of hiatal hernia    Hypercholesteremia    Hypertension    Hypothyroidism     Past Surgical History:  Procedure Laterality Date   ABDOMINAL HYSTERECTOMY     BREAST LUMPECTOMY     benign   CARDIAC CATHETERIZATION     9 YRS AGO   COLONOSCOPY N/A 05/30/2012   Procedure: COLONOSCOPY;  Surgeon: Malissa Hippo, MD;  [facility];  Service: Endoscopy;  Laterality: N/A;  830-moved to 1125 Ann to notify pt   DILATION AND CURETTAGE OF UTERUS     X2   JOINT REPLACEMENT Right    right hip   Right Carpal Tunnel Release     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   Procedure: LEFT TOTAL KNEE ARTHROPLASTY;  Surgeon: Loanne Drilling, MD;  Location: [facility];  Service: Orthopedics;  Laterality: Left;   TOTAL SHOULDER ARTHROPLASTY Right 06/17/2016   TOTAL SHOULDER ARTHROPLASTY Right 06/17/2016   Procedure: RIGHT TOTAL SHOULDER ARTHROPLASTY;  Surgeon: Beverely Low, MD;  Location: [facility];  Service: Orthopedics;  Laterality: Right;   TUBAL LIGATION      Current Outpatient Medications  Medication Sig Dispense Refill   acetaminophen (TYLENOL) 650 MG CR tablet Take 1,300 mg by mouth every 8 (eight) hours as needed for pain.     albuterol (VENTOLIN HFA) 108 (90 Base) MCG/ACT inhaler INHALE 2 PUFFS BY MOUTH EVERY 6 HOURS AS NEEDED FOR WHEEZE OR SHORTNESS OF BREATH 8.5 each 2   amLODipine (NORVASC) 5 MG tablet Take 1 tablet (5 mg total) by mouth daily. 90 tablet 0   aspirin EC 81 MG tablet Take 1  tablet (81 mg total) by mouth daily. 30 tablet 11   clorazepate (TRANXENE) 3.75 MG tablet Take 1 tablet (3.75 mg total) by mouth at bedtime as needed for anxiety. 30 tablet 5   DULoxetine (CYMBALTA) 60 MG capsule Take 1 capsule (60 mg total) by mouth 2 (two) times daily. 180 capsule 0   estradiol (ESTRACE) 0.1 MG/GM vaginal cream PLACE 2 GRAMS VAGINALLY AS DIRECTED EVERY OTHER NIGHT (Patient taking differently: Place 1 Applicatorful vaginally every 3 (three) days.) 42.5 g 12   ferrous sulfate (FE TABS) 325 (65 FE) MG EC tablet Take 1 tablet (325 mg total) by mouth daily with breakfast. (Patient taking differently: Take 325 mg by mouth every Monday, Wednesday, and Friday.) 90 tablet 3   fexofenadine (ALLEGRA) 180 MG tablet Take 1 tablet (180 mg total) by mouth daily. For allergy symptoms (Patient taking differently: Take 180 mg by mouth daily as needed for allergies.) 30 tablet 11   fluticasone (FLONASE) 50 MCG/ACT nasal spray Place 2 sprays into both nostrils daily. 16 g 6   levothyroxine (SYNTHROID) 125 MCG tablet Take 1 tablet (125 mcg total) by mouth daily. (NEEDS TO BE SEEN BEFORE NEXT REFILL) 30 tablet 0   Omega-3 Fatty Acids (FISH OIL) 300 MG CAPS Take 1 capsule (300 mg total) by mouth daily.     omeprazole (PRILOSEC) 40 MG capsule TAKE 1 CAPSULE (40 MG TOTAL) BY MOUTH DAILY. (Patient taking differently: Take 40 mg by mouth at bedtime.) 90 capsule 0   polyethylene glycol powder (GLYCOLAX/MIRALAX) 17 GM/SCOOP powder Take 17 g by mouth 2 (two) times daily as needed for moderate constipation. For constipation 3350 g 5   rosuvastatin (CRESTOR) 20 MG tablet Take 1 tablet (20 mg total) by mouth daily. 90 tablet 0   triamterene-hydrochlorothiazide (MAXZIDE-25) 37.5-25 MG tablet Take 1 tablet by mouth daily. As needed for swelling 90 tablet 0   No current facility-administered medications for this visit.    Allergies as of 07/25/2022 - Review Complete 07/25/2022  Allergen Reaction Noted   Sulfa  antibiotics Rash 09/16/2021   Seroquel [quetiapine] Other (See Comments) 03/30/2021   Sulfonamide derivatives Rash     Family History  Problem Relation Age of Onset   Dementia Mother    Hypertension Mother    Lupus Mother    Fibromyalgia Sister    Depression Sister    Bipolar disorder Daughter    Drug abuse Daughter    Breast cancer Maternal Aunt    Breast cancer Cousin    Breast cancer Cousin    Colon cancer Neg Hx     Social History   Socioeconomic History   Marital status: Married    Spouse name: Casimiro Needle   Number of children: 3   Years of education: Not on file   Highest education level: Not on file  Occupational History   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  Tobacco Use   Smoking status: Never   Smokeless tobacco: Never  Vaping Use   Vaping Use: Never used  Substance and Sexual Activity   Alcohol use: No   Drug use: No   Sexual activity: Yes    Birth control/protection: Surgical    Comment: hyst  Other Topics Concern   Not on file  Social History Narrative   3 grandchildren live with her and her husband full time - she has been their full time caregiver since 2019   One of her daughters lives nearby   Social Determinants of Health   Financial Resource Strain: Low Risk  (06/21/2022)   Overall Financial Resource Strain (CARDIA)    Difficulty of Paying Living Expenses: Not hard at all  Food Insecurity: No Food Insecurity (06/21/2022)   Hunger Vital Sign    Worried About Running Out of Food in the Last Year: Never true    Ran Out of Food in the Last Year: Never true  Transportation Needs: No Transportation Needs (06/21/2022)   PRAPARE - Administrator, Civil Service (Medical): No    Lack of Transportation (Non-Medical): No  Physical Activity: Insufficiently Active (06/21/2022)   Exercise Vital Sign    Days of Exercise per Week: 3 days    Minutes of Exercise per Session: 30 min  Stress: No Stress Concern Present (06/21/2022)   [employer] of Occupational Health - Occupational Stress Questionnaire    Feeling of Stress : Not at all  Social Connections: Moderately Isolated (06/21/2022)   Social Connection and Isolation Panel [NHANES]    Frequency of Communication with Friends and Family: More than three times a week    Frequency of Social Gatherings with Friends and Family: More than three times a week    Attends Religious Services: Never    Database administrator or Organizations: No    Attends Engineer, structural: Never    Marital Status: Married    Review of Systems: Gen: Denies fever, chills, cold or flulike symptoms, presyncope, syncope. CV: Denies chest pain, palpitations. Resp: Denies dyspnea, cough. GI: See HPI Heme: See HPI  Physical Exam: BP 124/80 (BP Location: Right Arm, Patient Position: Sitting, Cuff Size: Normal)   Pulse 82   Temp 97.6 F (36.4 C) (Temporal)   Ht 5\' 4"  (1.626 m)   Wt 187 lb 12.8 oz (85.2 kg)   SpO2 96%   BMI 32.24 kg/m  General:   Alert and oriented. No distress noted. Pleasant and cooperative.  Head:  Normocephalic and atraumatic. Eyes:  Conjuctiva clear without scleral icterus. Heart:  S1, S2 present without murmurs appreciated. Lungs:  Clear to auscultation bilaterally. No wheezes, rales, or rhonchi. No distress.  Abdomen:  +BS, soft, non-tender and non-distended. No rebound or guarding. No HSM or masses noted. Msk:  Symmetrical without gross deformities. Normal posture. Extremities:  Without edema. Neurologic:  Alert and  oriented x4 Psych:  Normal mood and affect.    Assessment:  67 year old female with history of anxiety, depression, HTN, HLD, hypothyroidism, GERD, adenomatous colon polyps, presenting today for follow-up of abdominal pain, constipation, IDA, and to discuss scheduling procedures.  Abdominal pain: Resolved s/p cholecystectomy.  Constipation: Well-controlled with MiraLAX twice daily.  IDA: Found to have iron deficiency anemia in  November 2023 with hemoglobin down to 8.5. She started oral iron daily with most recent hemoglobin 13.8 on 05/24/2022. She has since started taking iron about 3 times a week. FOBT was negative  on 05/25/2022.  Previously reported rectal bleeding in the setting of hemorrhoids, but none recently.  No melena.  She is at risk for PUD as she had previously been taking daily NSAIDs, but discontinued meloxicam after her last office visit with me in March 2024.  She is also due for colonoscopy due to history of colon polyps.  Etiology of her iron deficiency anemia is unclear, but we will plan to arrange EGD and colonoscopy for further evaluation.  GERD: Well-controlled on omeprazole 40 mg daily.  History of colon polyps: Due for surveillance colonoscopy. Last colonoscopy in March 2014 with 2 small polyps in the cecum and ascending colon removed, diverticula in the sigmoid colon, small external hemorrhoids. Pathology with tubular adenoma. Recommended 10-year surveillance.    Plan:  Proceed with upper endoscopy and colonoscopy with propofol by Dr. Levon Hedger in near future. The risks, benefits, and alternatives have been discussed with the patient in detail. The patient states understanding and desires to proceed.  ASA 2 BMP Hold iron x 7 days prior.  Continue omeprazole 40 mg daily. Continue MiraLAX 17 g twice daily. Continue to avoid NSAIDs. Follow-up after procedures.   Ermalinda Memos, PA-C [facility] 07/25/2022   I have reviewed the note and agree with the APP's assessment as described in this progress note  Katrinka Blazing, MD Gastroenterology and Hepatology [facility]

## 2022-07-25 ENCOUNTER — Ambulatory Visit (INDEPENDENT_AMBULATORY_CARE_PROVIDER_SITE_OTHER): Payer: Medicare HMO | Admitting: Gastroenterology

## 2022-07-25 ENCOUNTER — Encounter: Payer: Self-pay | Admitting: Gastroenterology

## 2022-07-25 VITALS — BP 124/80 | HR 82 | Temp 97.6°F | Ht 64.0 in | Wt 187.8 lb

## 2022-07-25 DIAGNOSIS — D509 Iron deficiency anemia, unspecified: Secondary | ICD-10-CM | POA: Diagnosis not present

## 2022-07-25 DIAGNOSIS — R109 Unspecified abdominal pain: Secondary | ICD-10-CM

## 2022-07-25 DIAGNOSIS — Z86010 Personal history of colonic polyps: Secondary | ICD-10-CM

## 2022-07-25 DIAGNOSIS — K219 Gastro-esophageal reflux disease without esophagitis: Secondary | ICD-10-CM | POA: Diagnosis not present

## 2022-07-25 DIAGNOSIS — K5900 Constipation, unspecified: Secondary | ICD-10-CM | POA: Diagnosis not present

## 2022-07-25 NOTE — Patient Instructions (Signed)
We will arrange to have an upper endoscopy and colonoscopy in the near future with Dr. Levon Hedger. You need to hold iron for 7 days prior to your procedures.  Continue omeprazole 40 mg daily.  Continue MiraLAX 17 g twice daily.  Continue to avoid NSAIDs.  We will follow-up with you in the office after your procedures.  It was good to see you again today!  I am glad you are feeling better!  Ermalinda Memos, PA-C [facility]

## 2022-07-26 ENCOUNTER — Encounter: Payer: Self-pay | Admitting: *Deleted

## 2022-07-26 ENCOUNTER — Other Ambulatory Visit: Payer: Self-pay | Admitting: *Deleted

## 2022-07-26 ENCOUNTER — Telehealth: Payer: Self-pay | Admitting: *Deleted

## 2022-07-26 DIAGNOSIS — D509 Iron deficiency anemia, unspecified: Secondary | ICD-10-CM

## 2022-07-26 DIAGNOSIS — Z86010 Personal history of colonic polyps: Secondary | ICD-10-CM

## 2022-07-26 DIAGNOSIS — K219 Gastro-esophageal reflux disease without esophagitis: Secondary | ICD-10-CM

## 2022-07-26 MED ORDER — PEG 3350-KCL-NA BICARB-NACL 420 G PO SOLR: 4000.0000 mL | Freq: Once | ORAL | 0 refills | Status: AC

## 2022-07-26 NOTE — Telephone Encounter (Signed)
[facility]  TCS/EGD w/Dr.Castaneda. ASA 2, BMP, Hold Iron x 7 days

## 2022-07-26 NOTE — Addendum Note (Signed)
Addended by: Elinor Dodge on: 07/26/2022 10:06 AM   Modules accepted: Orders

## 2022-07-26 NOTE — Telephone Encounter (Signed)
Availity PA: [id number]  Quantity 1 Days  Procedure From - To Date 2022-08-31  Status NO AUTH REQUIRED  Message No precert required. The requested service may not be eligible for coverage . Refer to the online Clinical Policy Bulletins or the Provider Code Search Tool on [employer]. You may also contact provider services using the Precert number on the member id card. [id number]  Procedure Code 2 21308  Quantity 1 Days  Procedure From - To Date 2022-08-31  Status NO AUTH REQUIRED

## 2022-07-26 NOTE — Telephone Encounter (Signed)
Pt has been scheduled for 08/31/22. Instructions mailed and prep sent to the pharmacy 

## 2022-08-03 ENCOUNTER — Ambulatory Visit (INDEPENDENT_AMBULATORY_CARE_PROVIDER_SITE_OTHER): Payer: Medicare HMO | Admitting: Family Medicine

## 2022-08-03 VITALS — BP 122/81 | HR 76 | Temp 97.8°F | Ht 64.0 in | Wt 188.0 lb

## 2022-08-03 DIAGNOSIS — E039 Hypothyroidism, unspecified: Secondary | ICD-10-CM | POA: Diagnosis not present

## 2022-08-03 DIAGNOSIS — I10 Essential (primary) hypertension: Secondary | ICD-10-CM

## 2022-08-03 DIAGNOSIS — Z79899 Other long term (current) drug therapy: Secondary | ICD-10-CM | POA: Diagnosis not present

## 2022-08-03 DIAGNOSIS — D509 Iron deficiency anemia, unspecified: Secondary | ICD-10-CM | POA: Diagnosis not present

## 2022-08-03 DIAGNOSIS — F418 Other specified anxiety disorders: Secondary | ICD-10-CM | POA: Diagnosis not present

## 2022-08-03 DIAGNOSIS — E785 Hyperlipidemia, unspecified: Secondary | ICD-10-CM

## 2022-08-03 DIAGNOSIS — K219 Gastro-esophageal reflux disease without esophagitis: Secondary | ICD-10-CM | POA: Diagnosis not present

## 2022-08-03 LAB — LIPID PANEL

## 2022-08-03 LAB — CMP14+EGFR
AST: 29 IU/L (ref 0–40)
Albumin/Globulin Ratio: 1.5 (ref 1.2–2.2)
Alkaline Phosphatase: 237 IU/L — ABNORMAL HIGH (ref 44–121)
Calcium: 10.8 mg/dL — ABNORMAL HIGH (ref 8.7–10.3)
Globulin, Total: 2.8 g/dL (ref 1.5–4.5)
Potassium: 4 mmol/L (ref 3.5–5.2)

## 2022-08-03 LAB — CBC WITH DIFFERENTIAL/PLATELET
Basophils Absolute: 0.1 10*3/uL (ref 0.0–0.2)
Eos: 6 %
Hematocrit: 42.5 % (ref 34.0–46.6)
Hemoglobin: 13.8 g/dL (ref 11.1–15.9)
Lymphocytes Absolute: 1.5 10*3/uL (ref 0.7–3.1)
MCV: 83 fL (ref 79–97)
Neutrophils: 67 %

## 2022-08-03 LAB — IRON,TIBC AND FERRITIN PANEL

## 2022-08-03 MED ORDER — ROSUVASTATIN CALCIUM 20 MG PO TABS: 20.0000 mg | ORAL_TABLET | Freq: Every day | ORAL | 2 refills | Status: DC

## 2022-08-03 MED ORDER — OMEPRAZOLE 40 MG PO CPDR: 40.0000 mg | DELAYED_RELEASE_CAPSULE | Freq: Every day | ORAL | 2 refills | Status: DC

## 2022-08-03 MED ORDER — TRIAMTERENE-HCTZ 37.5-25 MG PO TABS: 1.0000 | ORAL_TABLET | Freq: Every day | ORAL | 2 refills | Status: DC

## 2022-08-03 MED ORDER — CLORAZEPATE DIPOTASSIUM 3.75 MG PO TABS
3.7500 mg | ORAL_TABLET | Freq: Every evening | ORAL | 5 refills | Status: DC | PRN
Start: 2022-08-03 — End: 2023-02-06

## 2022-08-03 MED ORDER — DULOXETINE HCL 60 MG PO CPEP: 60.0000 mg | ORAL_CAPSULE | Freq: Two times a day (BID) | ORAL | 0 refills | Status: DC

## 2022-08-03 MED ORDER — LEVOTHYROXINE SODIUM 125 MCG PO TABS: 125.0000 ug | ORAL_TABLET | Freq: Every day | ORAL | 2 refills | Status: DC

## 2022-08-03 MED ORDER — ESTRADIOL 0.1 MG/GM VA CREA: 1.0000 | TOPICAL_CREAM | VAGINAL | 11 refills | Status: DC

## 2022-08-03 MED ORDER — AMLODIPINE BESYLATE 5 MG PO TABS: 5.0000 mg | ORAL_TABLET | Freq: Every day | ORAL | 2 refills | Status: DC

## 2022-08-03 NOTE — Progress Notes (Signed)
Subjective:  Patient ID: Leslie Robbins, female    DOB: March 21, 1955  Age: 67 y.o. MRN: [id number]  CC: Medical Management of Chronic Issues   HPI Leslie Robbins presents for  follow-up on  thyroid. The patient has a history of hypothyroidism for many years. It has been stable recently. Pt. denies any change in  voice, loss of hair, heat or cold intolerance. Energy level has been adequate to good. Patient denies constipation and diarrhea. No myxedema. Medication is as noted below. Verified that pt is taking it daily on an empty stomach. Well tolerated.  Colonoscopy and EGD coming up. Recently had cholecystectomy.     08/03/2022   10:10 AM 06/21/2022    1:56 PM 05/13/2022   12:32 PM  Depression screen PHQ 2/9  Decreased Interest 0 0 1  Down, Depressed, Hopeless 0 0 0  PHQ - 2 Score 0 0 1  Altered sleeping 1 0 2  Tired, decreased energy 1 0 2  Change in appetite 0 0 0  Feeling bad or failure about yourself  0 0 2  Trouble concentrating 0 0 0  Moving slowly or fidgety/restless 0 0 0  Suicidal thoughts 0 0 0  PHQ-9 Score 2 0 7  Difficult doing work/chores Somewhat difficult Not difficult at all Somewhat difficult      08/03/2022   10:10 AM 05/13/2022   12:33 PM 02/02/2022   10:31 AM 09/16/2021    9:36 AM  GAD 7 : Generalized Anxiety Score  Nervous, Anxious, on Edge 1 1 2 2   Control/stop worrying 1 3 1 1   Worry too much - different things 1 1 1 1   Trouble relaxing 0 0 0 2  Restless 0 0 0 1  Easily annoyed or irritable 0 3 2 2   Afraid - awful might happen 0 1 0 1  Total GAD 7 Score 3 9 6 10   Anxiety Difficulty Somewhat difficult Somewhat difficult Not difficult at all Not difficult at all     History Leslie Robbins has a past medical history of Anxiety, Arthritis, Depression, GERD (gastroesophageal reflux disease), History of hiatal hernia, Hypercholesteremia, Hypertension, and Hypothyroidism.   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   Her family history includes Bipolar disorder in her daughter; Breast cancer in her cousin, cousin, and maternal aunt; Dementia in her mother; Depression in her sister; Drug abuse in her daughter; Fibromyalgia in her sister; Hypertension in her mother; Lupus in her mother.She reports that she has never smoked. She has never used smokeless tobacco. She reports that she does not drink alcohol and does not use drugs.    ROS Review of Systems  Constitutional: Negative.   HENT:  Negative for congestion.   Eyes:  Negative for visual disturbance.  Respiratory:  Negative for shortness of breath.   Cardiovascular:  Negative for chest pain.  Gastrointestinal:  Positive for abdominal pain. Negative for constipation, diarrhea, nausea and vomiting.  Genitourinary:  Negative for difficulty urinating.  Musculoskeletal:  Positive for arthralgias. Negative for myalgias.  Neurological:  Negative for headaches.  Psychiatric/Behavioral:  Negative for sleep disturbance.     Objective:  BP 122/81   Pulse 76   Temp 97.8 F (36.6 C)   Ht 5\' 4"  (1.626 m)  Wt 188 lb (85.3 kg)   SpO2 95%   BMI 32.27 kg/m   BP Readings from Last 3 Encounters:  08/03/22 122/81  07/25/22 124/80  06/27/22 (!) 141/86    Wt Readings from Last 3 Encounters:  08/03/22 188 lb (85.3 kg)  07/25/22 187 lb 12.8 oz (85.2 kg)  06/27/22 193 lb (87.5 kg)     Physical Exam Constitutional:      General: She is not in acute distress.    Appearance: She is well-developed.  HENT:     Head: Normocephalic and atraumatic.  Eyes:     Conjunctiva/sclera: Conjunctivae normal.     Pupils: Pupils are equal, round, and reactive to light.  Neck:      Thyroid: No thyromegaly.  Cardiovascular:     Rate and Rhythm: Normal rate and regular rhythm.     Heart sounds: Normal heart sounds. No murmur heard. Pulmonary:     Effort: Pulmonary effort is normal. No respiratory distress.     Breath sounds: Normal breath sounds. No wheezing or rales.  Abdominal:     General: Bowel sounds are normal. There is no distension.     Palpations: Abdomen is soft.     Tenderness: There is abdominal tenderness (related to recent chlecystectomy).  Musculoskeletal:        General: Normal range of motion.     Cervical back: Normal range of motion and neck supple.  Lymphadenopathy:     Cervical: No cervical adenopathy.  Skin:    General: Skin is warm and dry.  Neurological:     Mental Status: She is alert and oriented to person, place, and time.  Psychiatric:        Behavior: Behavior normal.        Thought Content: Thought content normal.        Judgment: Judgment normal.       Assessment & Plan:   Jomanda was seen today for medical management of chronic issues.  Diagnoses and all orders for this visit:  Hypothyroidism, unspecified type -     TSH + free T4 -     levothyroxine (SYNTHROID) 125 MCG tablet; Take 1 tablet (125 mcg total) by mouth daily.  Essential hypertension -     CBC with Differential/Platelet -     CMP14+EGFR -     amLODipine (NORVASC) 5 MG tablet; Take 1 tablet (5 mg total) by mouth daily. -     triamterene-hydrochlorothiazide (MAXZIDE-25) 37.5-25 MG tablet; Take 1 tablet by mouth daily. As needed for swelling  Hyperlipidemia with target LDL less than 100 -     Lipid panel -     rosuvastatin (CRESTOR) 20 MG tablet; Take 1 tablet (20 mg total) by mouth daily.  Iron deficiency anemia, unspecified iron deficiency anemia type -     Iron, TIBC and Ferritin Panel -     Vitamin B12  Depression with anxiety -     clorazepate (TRANXENE) 3.75 MG tablet; Take 1 tablet (3.75 mg total) by mouth at bedtime as needed for anxiety. -      DULoxetine (CYMBALTA) 60 MG capsule; Take 1 capsule (60 mg total) by mouth 2 (two) times daily.  Gastroesophageal reflux disease without esophagitis -     omeprazole (PRILOSEC) 40 MG capsule; Take 1 capsule (40 mg total) by mouth daily.  Controlled substance agreement signed -     ToxASSURE Select 13 (MW), Urine  Other orders -     estradiol (ESTRACE) 0.1 MG/GM vaginal cream; Place  1 Applicatorful vaginally every 3 (three) days.       I have changed Arby Barrette. Kocak's levothyroxine and estradiol. I am also having her maintain her polyethylene glycol powder, fluticasone, fexofenadine, albuterol, ferrous sulfate, acetaminophen, aspirin EC, Fish Oil, amLODipine, clorazepate, DULoxetine, omeprazole, rosuvastatin, and triamterene-hydrochlorothiazide.  Allergies as of 08/03/2022       Reactions   Sulfa Antibiotics Rash   Seroquel [quetiapine] Other (See Comments)   Seroquel anger, hostility, increased anxiety   Sulfonamide Derivatives Rash   SULFONAMIDES INCREASE SENSITIVITY   TO SUNLIGHT > MORE LIKELY TO SUNBURN SUNBURN-LIKE REDNESS        Medication List        Accurate as of Aug 03, 2022 10:55 AM. If you have any questions, ask your nurse or doctor.          acetaminophen 650 MG CR tablet Commonly known as: TYLENOL Take 1,300 mg by mouth every 8 (eight) hours as needed for pain.   albuterol 108 (90 Base) MCG/ACT inhaler Commonly known as: VENTOLIN HFA INHALE 2 PUFFS BY MOUTH EVERY 6 HOURS AS NEEDED FOR WHEEZE OR SHORTNESS OF BREATH   amLODipine 5 MG tablet Commonly known as: NORVASC Take 1 tablet (5 mg total) by mouth daily.   aspirin EC 81 MG tablet Take 1 tablet (81 mg total) by mouth daily.   clorazepate 3.75 MG tablet Commonly known as: TRANXENE Take 1 tablet (3.75 mg total) by mouth at bedtime as needed for anxiety.   DULoxetine 60 MG capsule Commonly known as: CYMBALTA Take 1 capsule (60 mg total) by mouth 2 (two) times daily.   estradiol 0.1 MG/GM  vaginal cream Commonly known as: ESTRACE Place 1 Applicatorful vaginally every 3 (three) days.   ferrous sulfate 325 (65 FE) MG EC tablet Commonly known as: Fe Tabs Take 1 tablet (325 mg total) by mouth daily with breakfast. What changed: when to take this   fexofenadine 180 MG tablet Commonly known as: ALLEGRA Take 1 tablet (180 mg total) by mouth daily. For allergy symptoms What changed:  when to take this reasons to take this additional instructions   Fish Oil 300 MG Caps Take 1 capsule (300 mg total) by mouth daily.   fluticasone 50 MCG/ACT nasal spray Commonly known as: FLONASE Place 2 sprays into both nostrils daily.   levothyroxine 125 MCG tablet Commonly known as: SYNTHROID Take 1 tablet (125 mcg total) by mouth daily. What changed: additional instructions Changed by: Mechele Claude, MD   omeprazole 40 MG capsule Commonly known as: PRILOSEC Take 1 capsule (40 mg total) by mouth daily. What changed: when to take this   polyethylene glycol powder 17 GM/SCOOP powder Commonly known as: GLYCOLAX/MIRALAX Take 17 g by mouth 2 (two) times daily as needed for moderate constipation. For constipation   rosuvastatin 20 MG tablet Commonly known as: CRESTOR Take 1 tablet (20 mg total) by mouth daily.   triamterene-hydrochlorothiazide 37.5-25 MG tablet Commonly known as: MAXZIDE-25 Take 1 tablet by mouth daily. As needed for swelling         Follow-up: Return in about 6 months (around 02/03/2023) for Anxiety, Hypothyroidism.  Mechele Claude, M.D.

## 2022-08-04 ENCOUNTER — Other Ambulatory Visit: Payer: Self-pay | Admitting: Family Medicine

## 2022-08-04 ENCOUNTER — Other Ambulatory Visit (HOSPITAL_COMMUNITY): Payer: Self-pay

## 2022-08-04 ENCOUNTER — Telehealth: Payer: Self-pay

## 2022-08-04 DIAGNOSIS — E039 Hypothyroidism, unspecified: Secondary | ICD-10-CM

## 2022-08-04 LAB — CBC WITH DIFFERENTIAL/PLATELET
Immature Grans (Abs): 0 10*3/uL (ref 0.0–0.1)
Immature Granulocytes: 0 %
Lymphs: 20 %
MCHC: 32.5 g/dL (ref 31.5–35.7)
Platelets: 258 10*3/uL (ref 150–450)

## 2022-08-04 LAB — IRON,TIBC AND FERRITIN PANEL: Ferritin: 58 ng/mL (ref 15–150)

## 2022-08-04 LAB — CMP14+EGFR
Albumin: 4.3 g/dL (ref 3.9–4.9)
CO2: 22 mmol/L (ref 20–29)
Chloride: 99 mmol/L (ref 96–106)
Glucose: 94 mg/dL (ref 70–99)

## 2022-08-04 LAB — LIPID PANEL
Chol/HDL Ratio: 2.5 ratio (ref 0.0–4.4)
VLDL Cholesterol Cal: 19 mg/dL (ref 5–40)

## 2022-08-04 LAB — TSH+FREE T4: Free T4: 1.86 ng/dL — ABNORMAL HIGH (ref 0.82–1.77)

## 2022-08-04 MED ORDER — LEVOTHYROXINE SODIUM 112 MCG PO TABS: 112.0000 ug | ORAL_TABLET | Freq: Every day | ORAL | 1 refills | Status: DC

## 2022-08-04 MED ORDER — LEVOTHYROXINE SODIUM 125 MCG PO TABS
125.0000 ug | ORAL_TABLET | Freq: Every day | ORAL | 3 refills | Status: DC
Start: 2022-08-04 — End: 2023-08-07

## 2022-08-04 NOTE — Telephone Encounter (Signed)
Leslie Robbins ([name]) PA Case ID #: [id number] Rx #: [id number] Need Help? Call us at [phone] Status sent iconSent to Plan today Drug Clorazepate Dipotassium 3.75MG  tablets ePA cloud logo Form [employer] Electronic PA Form [phone] NCPDP) Original Claim Info [phone] PRECERT REQ BY MD [phone] REQUIRES PRIOR AUTHORIZATION(PHARMACY HELP DESK [phone])

## 2022-08-04 NOTE — Telephone Encounter (Signed)
Patient Advocate Encounter  Prior Authorization for Clorazepate Dipotassium 3.75MG  tablets has been approved with [employer].    PA# [id number] Effective dates: 03/07/22 through 09/03/22

## 2022-08-05 LAB — TOXASSURE SELECT 13 (MW), URINE

## 2022-08-06 LAB — CBC WITH DIFFERENTIAL/PLATELET
Basos: 1 %
EOS (ABSOLUTE): 0.4 10*3/uL (ref 0.0–0.4)
MCH: 27 pg (ref 26.6–33.0)
Monocytes Absolute: 0.4 10*3/uL (ref 0.1–0.9)
Monocytes: 6 %
Neutrophils Absolute: 5 10*3/uL (ref 1.4–7.0)
RBC: 5.12 x10E6/uL (ref 3.77–5.28)
RDW: 16.2 % — ABNORMAL HIGH (ref 11.7–15.4)
WBC: 7.3 10*3/uL (ref 3.4–10.8)

## 2022-08-06 LAB — LIPID PANEL
Cholesterol, Total: 122 mg/dL (ref 100–199)
LDL Chol Calc (NIH): 54 mg/dL (ref 0–99)

## 2022-08-06 LAB — CMP14+EGFR
ALT: 25 IU/L (ref 0–32)
BUN/Creatinine Ratio: 15 (ref 12–28)
BUN: 18 mg/dL (ref 8–27)
Bilirubin Total: 0.6 mg/dL (ref 0.0–1.2)
Sodium: 138 mmol/L (ref 134–144)
Total Protein: 7.1 g/dL (ref 6.0–8.5)

## 2022-08-06 LAB — IRON,TIBC AND FERRITIN PANEL
Iron Saturation: 30 % (ref 15–55)
Iron: 106 ug/dL (ref 27–139)
Total Iron Binding Capacity: 354 ug/dL (ref 250–450)

## 2022-08-06 LAB — VITAMIN B12: Vitamin B-12: 620 pg/mL (ref 232–1245)

## 2022-08-06 LAB — TSH+FREE T4: TSH: 0.016 u[IU]/mL — ABNORMAL LOW (ref 0.450–4.500)

## 2022-08-31 ENCOUNTER — Ambulatory Visit (HOSPITAL_COMMUNITY)
Admission: RE | Admit: 2022-08-31 | Discharge: 2022-08-31 | Disposition: A | Discharge status: Home / Self Care | Payer: Medicare HMO | Attending: Gastroenterology | Admitting: Gastroenterology

## 2022-08-31 ENCOUNTER — Encounter (HOSPITAL_COMMUNITY)
Admission: RE | Disposition: A | Discharge status: Home / Self Care | Payer: Self-pay | Source: Home / Self Care | Attending: Gastroenterology

## 2022-08-31 ENCOUNTER — Ambulatory Visit (HOSPITAL_COMMUNITY): Payer: Medicare HMO | Admitting: Certified Registered Nurse Anesthetist

## 2022-08-31 ENCOUNTER — Encounter (HOSPITAL_COMMUNITY): Payer: Self-pay | Admitting: Gastroenterology

## 2022-08-31 ENCOUNTER — Other Ambulatory Visit: Payer: Self-pay

## 2022-08-31 DIAGNOSIS — D122 Benign neoplasm of ascending colon: Secondary | ICD-10-CM | POA: Diagnosis not present

## 2022-08-31 DIAGNOSIS — D123 Benign neoplasm of transverse colon: Secondary | ICD-10-CM

## 2022-08-31 DIAGNOSIS — K219 Gastro-esophageal reflux disease without esophagitis: Secondary | ICD-10-CM | POA: Diagnosis not present

## 2022-08-31 DIAGNOSIS — K5730 Diverticulosis of large intestine without perforation or abscess without bleeding: Secondary | ICD-10-CM | POA: Diagnosis not present

## 2022-08-31 DIAGNOSIS — D128 Benign neoplasm of rectum: Secondary | ICD-10-CM | POA: Diagnosis not present

## 2022-08-31 DIAGNOSIS — F419 Anxiety disorder, unspecified: Secondary | ICD-10-CM | POA: Diagnosis not present

## 2022-08-31 DIAGNOSIS — I10 Essential (primary) hypertension: Secondary | ICD-10-CM | POA: Diagnosis not present

## 2022-08-31 DIAGNOSIS — K449 Diaphragmatic hernia without obstruction or gangrene: Secondary | ICD-10-CM

## 2022-08-31 DIAGNOSIS — F418 Other specified anxiety disorders: Secondary | ICD-10-CM

## 2022-08-31 DIAGNOSIS — K648 Other hemorrhoids: Secondary | ICD-10-CM | POA: Insufficient documentation

## 2022-08-31 DIAGNOSIS — K635 Polyp of colon: Secondary | ICD-10-CM | POA: Diagnosis not present

## 2022-08-31 DIAGNOSIS — F32A Depression, unspecified: Secondary | ICD-10-CM | POA: Insufficient documentation

## 2022-08-31 DIAGNOSIS — Z86010 Personal history of colonic polyps: Secondary | ICD-10-CM

## 2022-08-31 DIAGNOSIS — E039 Hypothyroidism, unspecified: Secondary | ICD-10-CM | POA: Insufficient documentation

## 2022-08-31 DIAGNOSIS — K3189 Other diseases of stomach and duodenum: Secondary | ICD-10-CM | POA: Diagnosis not present

## 2022-08-31 DIAGNOSIS — E785 Hyperlipidemia, unspecified: Secondary | ICD-10-CM | POA: Insufficient documentation

## 2022-08-31 DIAGNOSIS — D509 Iron deficiency anemia, unspecified: Secondary | ICD-10-CM | POA: Diagnosis not present

## 2022-08-31 DIAGNOSIS — Z1211 Encounter for screening for malignant neoplasm of colon: Secondary | ICD-10-CM | POA: Diagnosis not present

## 2022-08-31 DIAGNOSIS — D126 Benign neoplasm of colon, unspecified: Secondary | ICD-10-CM

## 2022-08-31 DIAGNOSIS — D124 Benign neoplasm of descending colon: Secondary | ICD-10-CM | POA: Diagnosis not present

## 2022-08-31 DIAGNOSIS — K5790 Diverticulosis of intestine, part unspecified, without perforation or abscess without bleeding: Secondary | ICD-10-CM | POA: Diagnosis not present

## 2022-08-31 DIAGNOSIS — K649 Unspecified hemorrhoids: Secondary | ICD-10-CM | POA: Diagnosis not present

## 2022-08-31 HISTORY — PX: ESOPHAGOGASTRODUODENOSCOPY (EGD) WITH PROPOFOL: SHX5813

## 2022-08-31 HISTORY — PX: COLONOSCOPY WITH PROPOFOL: SHX5780

## 2022-08-31 HISTORY — PX: BIOPSY: SHX5522

## 2022-08-31 LAB — HM COLONOSCOPY

## 2022-08-31 SURGERY — COLONOSCOPY WITH PROPOFOL
Anesthesia: General

## 2022-08-31 MED ORDER — LACTATED RINGERS IV SOLN: INTRAVENOUS | Status: DC

## 2022-08-31 MED ORDER — PROPOFOL 10 MG/ML IV BOLUS
INTRAVENOUS | Status: DC | PRN
  Administered 2022-08-31: 100 mg via INTRAVENOUS
  Administered 2022-08-31: 30 mg via INTRAVENOUS
  Administered 2022-08-31: 20 mg via INTRAVENOUS

## 2022-08-31 MED ORDER — PROPOFOL 500 MG/50ML IV EMUL
INTRAVENOUS | Status: DC | PRN
  Administered 2022-08-31: 150 ug/kg/min via INTRAVENOUS

## 2022-08-31 MED ORDER — STERILE WATER FOR IRRIGATION IR SOLN
Status: DC | PRN
  Administered 2022-08-31: 240 mL

## 2022-08-31 MED ORDER — PROPOFOL 500 MG/50ML IV EMUL: INTRAVENOUS | Status: DC | PRN

## 2022-08-31 MED ORDER — PROPOFOL 500 MG/50ML IV EMUL
INTRAVENOUS | Status: AC
  Filled 2022-08-31: qty 50

## 2022-08-31 MED ORDER — LIDOCAINE HCL 1 % IJ SOLN
INTRAMUSCULAR | Status: DC | PRN
  Administered 2022-08-31: 50 mg via INTRADERMAL

## 2022-08-31 MED ORDER — LACTATED RINGERS IV SOLN: INTRAVENOUS | Status: DC | PRN

## 2022-08-31 NOTE — Discharge Instructions (Addendum)
You are being discharged to home.  ?Resume your previous diet.  ?We are waiting for your pathology results.  ?Continue your present medications.  ?

## 2022-08-31 NOTE — Op Note (Addendum)
[facility] Patient Name: Leslie Robbins Procedure Date: 08/31/2022 11:19 AM MRN: [id number] Date of Birth: [birth year] Attending MD: Katrinka Blazing , , [id number] CSN: [id number] Age: 67 Admit Type: Outpatient Procedure:                Colonoscopy Indications:              Surveillance: Personal history of adenomatous                            polyps on last colonoscopy > 5 years ago Providers:                Katrinka Blazing, Edrick Kins, RN, Pandora Leiter,                            Technician Referring MD:              Medicines:                Monitored Anesthesia Care Complications:            No immediate complications. Estimated Blood Loss:     Estimated blood loss: none. Procedure:                Pre-Anesthesia Assessment:                           - Prior to the procedure, a History and Physical                            was performed, and patient medications, allergies                            and sensitivities were reviewed. The patient's                            tolerance of previous anesthesia was reviewed.                           - The risks and benefits of the procedure and the                            sedation options and risks were discussed with the                            patient. All questions were answered and informed                            consent was obtained.                           - ASA Grade Assessment: II - A patient with mild                            systemic disease.                           After obtaining informed consent, the colonoscope  was passed under direct vision. Throughout the                            procedure, the patient's blood pressure, pulse, and                            oxygen saturations were monitored continuously. The                            [id number]-HQ190L ([id number]) scope was introduced through                            the anus and advanced to the the terminal ileum.                             The colonoscopy was performed without difficulty.                            The patient tolerated the procedure well. The                            quality of the bowel preparation was good. Scope In: 11:41:35 AM Scope Out: 12:14:20 PM Scope Withdrawal Time: 0 hours 21 minutes 57 seconds  Total Procedure Duration: 0 hours 32 minutes 45 seconds  Findings:      The perianal and digital rectal examinations were normal.      The terminal ileum appeared normal.      A 4 mm polyp was found in the ascending colon. The polyp was sessile.       The polyp was removed with a cold snare. Resection and retrieval were       complete.      A 1 mm polyp was found in the transverse colon. The polyp was sessile.       The polyp was removed with a cold biopsy forceps. Resection and       retrieval were complete.      Two sessile polyps were found in the descending colon. The polyps were 3       to 5 mm in size. These polyps were removed with a cold snare. Resection       and retrieval were complete.      Scattered small-mouthed diverticula were found in the sigmoid colon and       descending colon.      A 2 mm polyp was found in the rectum. The polyp was sessile. The polyp       was removed with a cold snare. Resection and retrieval were complete.      Non-bleeding internal hemorrhoids were found during retroflexion. The       hemorrhoids were small. Impression:               - The examined portion of the ileum was normal.                           - One 4 mm polyp in the ascending colon, removed  with a cold snare. Resected and retrieved.                           - One 1 mm polyp in the transverse colon, removed                            with a cold biopsy forceps. Resected and retrieved.                           - Two 3 to 5 mm polyps in the descending colon,                            removed with a cold snare. Resected and retrieved.                            - Diverticulosis in the sigmoid colon and in the                            descending colon.                           - One 2 mm polyp in the rectum, removed with a cold                            snare. Resected and retrieved.                           - Non-bleeding internal hemorrhoids. Moderate Sedation:      Per Anesthesia Care Recommendation:           - Discharge patient to home (ambulatory).                           - Resume previous diet.                           - Await pathology results.                           - Repeat colonoscopy for surveillance based on                            pathology results. Procedure Code(s):        --- Professional ---                           [phone], Colonoscopy, flexible; with removal of                            tumor(s), polyp(s), or other lesion(s) by snare                            technique                           45380, 59, Colonoscopy, flexible; with  biopsy,                            single or multiple Diagnosis Code(s):        --- Professional ---                           Z86.010, Personal history of colonic polyps                           K64.8, Other hemorrhoids                           D12.2, Benign neoplasm of ascending colon                           D12.3, Benign neoplasm of transverse colon (hepatic                            flexure or splenic flexure)                           D12.8, Benign neoplasm of rectum                           D12.4, Benign neoplasm of descending colon                           K57.30, Diverticulosis of large intestine without                            perforation or abscess without bleeding CPT copyright 2022 American [employer] Association. All rights reserved. The codes documented in this report are preliminary and upon coder review may  be revised to meet current compliance requirements. Katrinka Blazing, MD Katrinka Blazing,  08/31/2022 12:19:55 PM This report has been signed  electronically. Number of Addenda: 0

## 2022-08-31 NOTE — Op Note (Signed)
[facility] Patient Name: Leslie Robbins Procedure Date: 08/31/2022 11:19 AM MRN: [id number] Date of Birth: 03/05/1956 Attending MD: Katrinka Blazing , , [id number] CSN: [id number] Age: 67 Admit Type: Outpatient Procedure:                Upper GI endoscopy Indications:              Iron deficiency anemia Providers:                Katrinka Blazing, Edrick Kins, RN, Pandora Leiter,                            Technician Referring MD:              Medicines:                Monitored Anesthesia Care Complications:            No immediate complications. Estimated Blood Loss:     Estimated blood loss: none. Procedure:                Pre-Anesthesia Assessment:                           - Prior to the procedure, a History and Physical                            was performed, and patient medications, allergies                            and sensitivities were reviewed. The patient's                            tolerance of previous anesthesia was reviewed.                           - The risks and benefits of the procedure and the                            sedation options and risks were discussed with the                            patient. All questions were answered and informed                            consent was obtained.                           - ASA Grade Assessment: II - A patient with mild                            systemic disease.                           After obtaining informed consent, the endoscope was                            passed under direct vision. Throughout the  procedure, the patient's blood pressure, pulse, and                            oxygen saturations were monitored continuously. The                            [id number]-[id number] ([id number]) scope was introduced through the                            mouth, and advanced to the second part of duodenum.                            The upper GI endoscopy was accomplished without                             difficulty. The patient tolerated the procedure                            well. Scope In: 11:26:47 AM Scope Out: 11:35:33 AM Total Procedure Duration: 0 hours 8 minutes 46 seconds  Findings:      A 6 cm hiatal hernia was present.      Patchy mildly erythematous mucosa without bleeding was found in the       gastric antrum. Biopsies were taken with a cold forceps for Helicobacter       pylori testing.      The examined duodenum was normal. Biopsies were taken with a cold       forceps for histology. Impression:               - 6 cm hiatal hernia.                           - Erythematous mucosa in the antrum. Biopsied.                           - Normal examined duodenum. Biopsied. Moderate Sedation:      Per Anesthesia Care Recommendation:           - Discharge patient to home (ambulatory).                           - Resume previous diet.                           - Await pathology results.                           - Continue present medications. Procedure Code(s):        --- Professional ---                           [phone], Esophagogastroduodenoscopy, flexible,                            transoral; with biopsy, single or multiple Diagnosis Code(s):        --- Professional ---  K44.9, Diaphragmatic hernia without obstruction or                            gangrene                           K31.89, Other diseases of stomach and duodenum                           D50.9, Iron deficiency anemia, unspecified CPT copyright 2022 American Medical Association. All rights reserved. The codes documented in this report are preliminary and upon coder review may  be revised to meet current compliance requirements. Katrinka Blazing, MD Katrinka Blazing,  08/31/2022 11:40:55 AM This report has been signed electronically. Number of Addenda: 0

## 2022-08-31 NOTE — H&P (Addendum)
Leslie Robbins is an 67 y.o. female.   Chief Complaint: iron def anemia and history of colon polyps HPI: Leslie Robbins is a 67 y.o. female presenting today with a history of with history of anxiety, depression, HTN, HLD, hypothyroidism, GERD, adenomatous colon polyps, currently due for surveillance colonoscopy, presenting today for evaluation of IDA and history of colon polyps.  The patient denies having any nausea, vomiting, fever, chills, hematochezia, melena, hematemesis, abdominal distention, abdominal pain, diarrhea, jaundice, pruritus or weight loss.   Past Medical History:  Diagnosis Date   Anxiety    Arthritis    Depression    GERD (gastroesophageal reflux disease)    History of hiatal hernia    Hypercholesteremia    Hypertension    Hypothyroidism     Past Surgical History:  Procedure Laterality Date   ABDOMINAL HYSTERECTOMY     BREAST LUMPECTOMY     benign   CARDIAC CATHETERIZATION     9 YRS AGO   COLONOSCOPY N/A 05/30/2012   Procedure: COLONOSCOPY;  Surgeon: Malissa Hippo, MD;  Location: [facility];  Service: Endoscopy;  Laterality: N/A;  830-moved to 1125 Ann to notify pt   DILATION AND CURETTAGE OF UTERUS     X2   JOINT REPLACEMENT Right    right hip   Right Carpal Tunnel Release     ROBOTIC ASSISTED LAPAROSCOPIC CHOLECYSTECTOMY-MULTI SITE N/A 06/27/2022   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   Procedure: LEFT TOTAL KNEE ARTHROPLASTY;  Surgeon: Loanne Drilling, MD;  Location: [facility];  Service: Orthopedics;  Laterality: Left;   TOTAL SHOULDER ARTHROPLASTY Right 06/17/2016   TOTAL SHOULDER ARTHROPLASTY Right 06/17/2016   Procedure: RIGHT TOTAL SHOULDER ARTHROPLASTY;  Surgeon: Beverely Low, MD;  Location: [facility] OR;  Service: Orthopedics;  Laterality: Right;   TUBAL LIGATION      Family History  Problem Relation Age of Onset   Dementia Mother    Hypertension Mother    Lupus Mother    Fibromyalgia Sister    Depression Sister    Bipolar disorder Daughter     Drug abuse Daughter    Breast cancer Maternal Aunt    Breast cancer Cousin    Breast cancer Cousin    Colon cancer Neg Hx    Social History:  reports that she has never smoked. She has never used smokeless tobacco. She reports that she does not drink alcohol and does not use drugs.  Allergies:  Allergies  Allergen Reactions   Sulfa Antibiotics Rash   Seroquel [Quetiapine] Other (See Comments)    Seroquel anger, hostility, increased anxiety   Sulfonamide Derivatives Rash    SULFONAMIDES INCREASE SENSITIVITY   TO SUNLIGHT > MORE LIKELY TO SUNBURN SUNBURN-LIKE REDNESS    Medications Prior to Admission  Medication Sig Dispense Refill   acetaminophen (TYLENOL) 650 MG CR tablet Take 1,300 mg by mouth every 8 (eight) hours as needed for pain.     amLODipine (NORVASC) 5 MG tablet Take 1 tablet (5 mg total) by mouth daily. 90 tablet 2   aspirin EC 81 MG tablet Take 1 tablet (81 mg total) by mouth daily. 30 tablet 11   clorazepate (TRANXENE) 3.75 MG tablet Take 1 tablet (3.75 mg total) by mouth at bedtime as needed for anxiety. 30 tablet 5   DULoxetine (CYMBALTA) 60 MG capsule Take 1 capsule (60 mg total) by mouth 2 (two) times daily. 180 capsule 0   estradiol (ESTRACE) 0.1 MG/GM vaginal cream Place 1 Applicatorful vaginally every 3 (  three) days. 42.5 g 11   fluticasone (FLONASE) 50 MCG/ACT nasal spray Place 2 sprays into both nostrils daily. 16 g 6   levothyroxine (SYNTHROID) 125 MCG tablet Take 1 tablet (125 mcg total) by mouth daily. 90 tablet 3   Omega-3 Fatty Acids (FISH OIL) 300 MG CAPS Take 1 capsule (300 mg total) by mouth daily.     omeprazole (PRILOSEC) 40 MG capsule Take 1 capsule (40 mg total) by mouth daily. 90 capsule 2   rosuvastatin (CRESTOR) 20 MG tablet Take 1 tablet (20 mg total) by mouth daily. 90 tablet 2   triamterene-hydrochlorothiazide (MAXZIDE-25) 37.5-25 MG tablet Take 1 tablet by mouth daily. As needed for swelling 90 tablet 2   albuterol (VENTOLIN HFA) 108 (90  Base) MCG/ACT inhaler INHALE 2 PUFFS BY MOUTH EVERY 6 HOURS AS NEEDED FOR WHEEZE OR SHORTNESS OF BREATH 8.5 each 2   ferrous sulfate (FE TABS) 325 (65 FE) MG EC tablet Take 1 tablet (325 mg total) by mouth daily with breakfast. (Patient taking differently: Take 325 mg by mouth every Monday, Wednesday, and Friday.) 90 tablet 3   fexofenadine (ALLEGRA) 180 MG tablet Take 1 tablet (180 mg total) by mouth daily. For allergy symptoms (Patient taking differently: Take 180 mg by mouth daily as needed for allergies.) 30 tablet 11   polyethylene glycol powder (GLYCOLAX/MIRALAX) 17 GM/SCOOP powder Take 17 g by mouth 2 (two) times daily as needed for moderate constipation. For constipation 3350 g 5    No results found for this or any previous visit (from the past 48 hour(s)). No results found.  Review of Systems  All other systems reviewed and are negative.   Blood pressure 135/80, pulse 80, temperature 97.9 F (36.6 C), temperature source Oral, resp. rate 18, height 5\' 4"  (1.626 m), weight 83.5 kg, SpO2 95 %. Physical Exam  GENERAL: The patient is AO x3, in no acute distress. HEENT: Head is normocephalic and atraumatic. EOMI are intact. Mouth is well hydrated and without lesions. NECK: Supple. No masses LUNGS: Clear to auscultation. No presence of rhonchi/wheezing/rales. Adequate chest expansion HEART: RRR, normal s1 and s2. ABDOMEN: Soft, nontender, no guarding, no peritoneal signs, and nondistended. BS +. No masses. EXTREMITIES: Without any cyanosis, clubbing, rash, lesions or edema. NEUROLOGIC: AOx3, no focal motor deficit. SKIN: no jaundice, no rashes  Assessment/Plan Leslie Robbins is a 67 y.o. female presenting today with a history of with history of anxiety, depression, HTN, HLD, hypothyroidism, GERD, adenomatous colon polyps, currently due for surveillance colonoscopy, presenting today for evaluation of IDA and history of colon polyps.Will proceed with EGD and colonoscopy.  Dolores Frame, MD 08/31/2022, 11:13 AM

## 2022-08-31 NOTE — Transfer of Care (Signed)
Immediate Anesthesia Transfer of Care Note  Patient: Shamika Pedregon Noguchi  Procedure(s) Performed: COLONOSCOPY WITH PROPOFOL ESOPHAGOGASTRODUODENOSCOPY (EGD) WITH PROPOFOL BIOPSY  Patient Location: Endoscopy Unit  Anesthesia Type:General  Level of Consciousness: awake, alert , and oriented  Airway & Oxygen Therapy: Patient Spontanous Breathing  Post-op Assessment: Report given to RN, Post -op Vital signs reviewed and stable, Patient moving all extremities X 4, and Patient able to stick tongue midline  Post vital signs: Reviewed  Last Vitals:  Vitals Value Taken Time  BP 105/63 08/31/22 1219  Temp 36.4 C 08/31/22 1219  Pulse 65 08/31/22 1219  Resp 18   SpO2 98 % 08/31/22 1219    Last Pain:  Vitals:   08/31/22 1219  TempSrc: Oral  PainSc: 0-No pain      Patients Stated Pain Goal: 5 (08/31/22 0929)  Complications: No notable events documented.

## 2022-08-31 NOTE — Anesthesia Preprocedure Evaluation (Signed)
Anesthesia Evaluation  Patient identified by MRN, date of birth, ID band Patient awake    Reviewed: Allergy & Precautions, H&P , NPO status , Patient's Chart, lab work & pertinent test results, reviewed documented beta blocker date and time   Airway Mallampati: II  TM Distance: >3 FB Neck ROM: full    Dental no notable dental hx.    Pulmonary neg pulmonary ROS   Pulmonary exam normal breath sounds clear to auscultation       Cardiovascular Exercise Tolerance: Good hypertension, negative cardio ROS  Rhythm:regular Rate:Normal     Neuro/Psych  PSYCHIATRIC DISORDERS Anxiety Depression    negative neurological ROS  negative psych ROS   GI/Hepatic negative GI ROS, Neg liver ROS, hiatal hernia,GERD  ,,  Endo/Other  negative endocrine ROSHypothyroidism    Renal/GU negative Renal ROS  negative genitourinary   Musculoskeletal   Abdominal   Peds  Hematology negative hematology ROS (+) Blood dyscrasia, anemia   Anesthesia Other Findings   Reproductive/Obstetrics negative OB ROS                             Anesthesia Physical Anesthesia Plan  ASA: 2  Anesthesia Plan: General   Post-op Pain Management:    Induction:   PONV Risk Score and Plan:   Airway Management Planned:   Additional Equipment:   Intra-op Plan:   Post-operative Plan:   Informed Consent: I have reviewed the patients History and Physical, chart, labs and discussed the procedure including the risks, benefits and alternatives for the proposed anesthesia with the patient or authorized representative who has indicated his/her understanding and acceptance.     Dental Advisory Given  Plan Discussed with: CRNA  Anesthesia Plan Comments:        Anesthesia Quick Evaluation

## 2022-09-02 LAB — SURGICAL PATHOLOGY

## 2022-09-02 NOTE — Anesthesia Postprocedure Evaluation (Signed)
Anesthesia Post Note  Patient: Leslie Robbins  Procedure(s) Performed: COLONOSCOPY WITH PROPOFOL ESOPHAGOGASTRODUODENOSCOPY (EGD) WITH PROPOFOL BIOPSY  Patient location during evaluation: Phase II Anesthesia Type: General Level of consciousness: awake Pain management: pain level controlled Vital Signs Assessment: post-procedure vital signs reviewed and stable Respiratory status: spontaneous breathing and respiratory function stable Cardiovascular status: blood pressure returned to baseline and stable Postop Assessment: no headache and no apparent nausea or vomiting Anesthetic complications: no Comments: Late entry   No notable events documented.   Last Vitals:  Vitals:   08/31/22 0929 08/31/22 1219  BP: 135/80 105/63  Pulse: 80 65  Resp: 18   Temp: 36.6 C 36.4 C  SpO2: 95% 98%    Last Pain:  Vitals:   08/31/22 1219  TempSrc: Oral  PainSc: 0-No pain                 Windell Norfolk

## 2022-09-05 ENCOUNTER — Encounter (HOSPITAL_COMMUNITY): Payer: Self-pay | Admitting: Gastroenterology

## 2022-09-05 ENCOUNTER — Encounter (INDEPENDENT_AMBULATORY_CARE_PROVIDER_SITE_OTHER): Payer: Self-pay | Admitting: *Deleted

## 2022-09-19 ENCOUNTER — Telehealth: Payer: Self-pay | Admitting: Gastroenterology

## 2022-09-19 ENCOUNTER — Telehealth (INDEPENDENT_AMBULATORY_CARE_PROVIDER_SITE_OTHER): Payer: Self-pay | Admitting: Gastroenterology

## 2022-09-19 NOTE — Telephone Encounter (Signed)
Room 1 Thanks 

## 2022-09-19 NOTE — Telephone Encounter (Signed)
Patient left a message saying the last time she was in the office she was told that she didn't need to pay her copay.  (There is a note in the [employer] Notes about that).  She keeps getting a bill for $20 and she wanted to make sure she doesn't owe that because she was told she didn't have to pay it.

## 2022-09-19 NOTE — Telephone Encounter (Signed)
Pt called in and is ready to proceed with EGD. Pt had TCS 08/31/22. Please advise what room.   (Pt available these dates in August; 7,8,9,13,15,21,23,26-30 but not on 11/02/22. Would like to have EGD mid morning around 10 or 11am)

## 2022-09-20 ENCOUNTER — Encounter (INDEPENDENT_AMBULATORY_CARE_PROVIDER_SITE_OTHER): Payer: Self-pay

## 2022-10-12 NOTE — Telephone Encounter (Signed)
Yes, Dx: GAVE, room 1 Thanks

## 2022-10-12 NOTE — Telephone Encounter (Signed)
Pt contacted and EGD scheduled for 11/03/22 at 12pm. Lab order already in. Instructions sent via mail. No PA needed per insurance.

## 2022-10-12 NOTE — Telephone Encounter (Signed)
Pt had EGD in June-will patient need another one? Please advise. Thank you

## 2022-10-31 ENCOUNTER — Other Ambulatory Visit (HOSPITAL_COMMUNITY)
Admission: RE | Admit: 2022-10-31 | Discharge: 2022-10-31 | Disposition: A | Discharge status: Home / Self Care | Payer: Medicare HMO | Source: Ambulatory Visit | Attending: Gastroenterology | Admitting: Gastroenterology

## 2022-10-31 DIAGNOSIS — Z860100 Personal history of colon polyps, unspecified: Secondary | ICD-10-CM

## 2022-10-31 DIAGNOSIS — D509 Iron deficiency anemia, unspecified: Secondary | ICD-10-CM

## 2022-10-31 DIAGNOSIS — K219 Gastro-esophageal reflux disease without esophagitis: Secondary | ICD-10-CM | POA: Diagnosis not present

## 2022-10-31 LAB — BASIC METABOLIC PANEL
Anion gap: 7 (ref 5–15)
BUN: 22 mg/dL (ref 8–23)
CO2: 27 mmol/L (ref 22–32)
Calcium: 10.2 mg/dL (ref 8.9–10.3)
Chloride: 100 mmol/L (ref 98–111)
Creatinine, Ser: 1.16 mg/dL — ABNORMAL HIGH (ref 0.44–1.00)
GFR, Estimated: 51 mL/min — ABNORMAL LOW (ref >60)
Glucose, Bld: 93 mg/dL (ref 70–99)
Potassium: 4.1 mmol/L (ref 3.5–5.1)
Sodium: 134 mmol/L — ABNORMAL LOW (ref 135–145)

## 2022-11-03 ENCOUNTER — Ambulatory Visit (HOSPITAL_BASED_OUTPATIENT_CLINIC_OR_DEPARTMENT_OTHER): Payer: Medicare HMO | Admitting: Certified Registered"

## 2022-11-03 ENCOUNTER — Other Ambulatory Visit: Payer: Self-pay

## 2022-11-03 ENCOUNTER — Ambulatory Visit (HOSPITAL_COMMUNITY): Payer: Medicare HMO | Admitting: Certified Registered"

## 2022-11-03 ENCOUNTER — Encounter (HOSPITAL_COMMUNITY): Payer: Self-pay | Admitting: Gastroenterology

## 2022-11-03 ENCOUNTER — Ambulatory Visit (HOSPITAL_COMMUNITY)
Admission: RE | Admit: 2022-11-03 | Discharge: 2022-11-03 | Disposition: A | Discharge status: Home / Self Care | Payer: Medicare HMO | Attending: Gastroenterology | Admitting: Gastroenterology

## 2022-11-03 ENCOUNTER — Encounter (HOSPITAL_COMMUNITY)
Admission: RE | Disposition: A | Discharge status: Home / Self Care | Payer: Self-pay | Source: Home / Self Care | Attending: Gastroenterology

## 2022-11-03 DIAGNOSIS — Z79899 Other long term (current) drug therapy: Secondary | ICD-10-CM | POA: Diagnosis not present

## 2022-11-03 DIAGNOSIS — D509 Iron deficiency anemia, unspecified: Secondary | ICD-10-CM | POA: Diagnosis not present

## 2022-11-03 DIAGNOSIS — K31819 Angiodysplasia of stomach and duodenum without bleeding: Secondary | ICD-10-CM

## 2022-11-03 DIAGNOSIS — K449 Diaphragmatic hernia without obstruction or gangrene: Secondary | ICD-10-CM | POA: Diagnosis not present

## 2022-11-03 DIAGNOSIS — K219 Gastro-esophageal reflux disease without esophagitis: Secondary | ICD-10-CM | POA: Insufficient documentation

## 2022-11-03 HISTORY — PX: HOT HEMOSTASIS: SHX5433

## 2022-11-03 HISTORY — PX: ESOPHAGOGASTRODUODENOSCOPY (EGD) WITH PROPOFOL: SHX5813

## 2022-11-03 SURGERY — ESOPHAGOGASTRODUODENOSCOPY (EGD) WITH PROPOFOL
Anesthesia: General

## 2022-11-03 MED ORDER — GLUCAGON HCL RDNA (DIAGNOSTIC) 1 MG IJ SOLR
INTRAMUSCULAR | Status: DC | PRN
Start: 2022-11-03 — End: 2022-11-03
  Administered 2022-11-03: .25 mg via INTRAVENOUS

## 2022-11-03 MED ORDER — OMEPRAZOLE 40 MG PO CPDR
40.0000 mg | DELAYED_RELEASE_CAPSULE | Freq: Two times a day (BID) | ORAL | 0 refills | Status: DC
Start: 2022-11-03 — End: 2022-12-12

## 2022-11-03 MED ORDER — PROPOFOL 500 MG/50ML IV EMUL
INTRAVENOUS | Status: DC | PRN
  Administered 2022-11-03: 175 ug/kg/min via INTRAVENOUS

## 2022-11-03 MED ORDER — PROPOFOL 10 MG/ML IV BOLUS
INTRAVENOUS | Status: DC | PRN
  Administered 2022-11-03: 100 mg via INTRAVENOUS
  Administered 2022-11-03: 40 mg via INTRAVENOUS

## 2022-11-03 MED ORDER — LACTATED RINGERS IV SOLN: INTRAVENOUS | Status: DC

## 2022-11-03 MED ORDER — LIDOCAINE HCL (CARDIAC) PF 100 MG/5ML IV SOSY
PREFILLED_SYRINGE | INTRAVENOUS | Status: DC | PRN
  Administered 2022-11-03: 100 mg via INTRAVENOUS

## 2022-11-03 MED ORDER — PHENYLEPHRINE 80 MCG/ML (10ML) SYRINGE FOR IV PUSH (FOR BLOOD PRESSURE SUPPORT)
PREFILLED_SYRINGE | INTRAVENOUS | Status: DC | PRN
  Administered 2022-11-03 (×2): 160 ug via INTRAVENOUS

## 2022-11-03 MED ORDER — PHENYLEPHRINE 80 MCG/ML (10ML) SYRINGE FOR IV PUSH (FOR BLOOD PRESSURE SUPPORT)
PREFILLED_SYRINGE | INTRAVENOUS | Status: AC
  Filled 2022-11-03: qty 10

## 2022-11-03 NOTE — Discharge Instructions (Signed)
You are being discharged to home.  Resume your previous diet.  Take Prilosec (omeprazole) 40 mg by mouth twice a day for four weeks.  Follow up in GI clinic in 3 months - will repeat CBC and iron stores. Continue oral iron.

## 2022-11-03 NOTE — Anesthesia Preprocedure Evaluation (Signed)
Anesthesia Evaluation  Patient identified by MRN, date of birth, ID band Patient awake    Reviewed: Allergy & Precautions, H&P , NPO status , Patient's Chart, lab work & pertinent test results, reviewed documented beta blocker date and time   Airway Mallampati: II  TM Distance: >3 FB Neck ROM: full    Dental no notable dental hx.    Pulmonary neg pulmonary ROS   Pulmonary exam normal breath sounds clear to auscultation       Cardiovascular Exercise Tolerance: Good hypertension, negative cardio ROS  Rhythm:regular Rate:Normal     Neuro/Psych  PSYCHIATRIC DISORDERS Anxiety Depression    negative neurological ROS  negative psych ROS   GI/Hepatic negative GI ROS, Neg liver ROS, hiatal hernia,GERD  ,,  Endo/Other  negative endocrine ROSHypothyroidism    Renal/GU negative Renal ROS  negative genitourinary   Musculoskeletal   Abdominal   Peds  Hematology negative hematology ROS (+) Blood dyscrasia, anemia   Anesthesia Other Findings   Reproductive/Obstetrics negative OB ROS                             Anesthesia Physical Anesthesia Plan  ASA: 2  Anesthesia Plan: General   Post-op Pain Management:    Induction:   PONV Risk Score and Plan: Propofol infusion  Airway Management Planned:   Additional Equipment:   Intra-op Plan:   Post-operative Plan:   Informed Consent: I have reviewed the patients History and Physical, chart, labs and discussed the procedure including the risks, benefits and alternatives for the proposed anesthesia with the patient or authorized representative who has indicated his/her understanding and acceptance.     Dental Advisory Given  Plan Discussed with: CRNA  Anesthesia Plan Comments:        Anesthesia Quick Evaluation

## 2022-11-03 NOTE — Op Note (Signed)
[facility] Patient Name: Leslie Robbins Procedure Date: 11/03/2022 10:55 AM MRN: [id number] Date of Birth: [birth year] Attending MD: Katrinka Blazing , , [id number] CSN: [id number] Age: 67 Admit Type: Outpatient Procedure:                Upper GI endoscopy Indications:              Iron deficiency anemia, Watermelon stomach (GAVE                            syndrome) Providers:                Katrinka Blazing, Buel Ream. Thomasena Edis RN, RN, Lennice Sites Technician, Pensions consultant Referring MD:              Medicines:                Monitored Anesthesia Care Complications:            No immediate complications. Estimated Blood Loss:     Estimated blood loss: none. Procedure:                Pre-Anesthesia Assessment:                           - Prior to the procedure, a History and Physical                            was performed, and patient medications, allergies                            and sensitivities were reviewed. The patient's                            tolerance of previous anesthesia was reviewed.                           - The risks and benefits of the procedure and the                            sedation options and risks were discussed with the                            patient. All questions were answered and informed                            consent was obtained.                           - ASA Grade Assessment: II - A patient with mild                            systemic disease.                           After obtaining informed consent, the endoscope was  passed under direct vision. Throughout the                            procedure, the patient's blood pressure, pulse, and                            oxygen saturations were monitored continuously. The                            GIF-H190 ([id number]) scope was introduced through the                            mouth, and advanced to the second part of duodenum.                             The upper GI endoscopy was accomplished without                            difficulty. The patient tolerated the procedure                            well. Scope In: 11:23:54 AM Scope Out: 11:37:31 AM Total Procedure Duration: 0 hours 13 minutes 37 seconds  Findings:      A 7 cm hiatal hernia was present.      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 Coagulation for bleeding prevention using argon       plasma at 0.3 liters/minute and 30 watts was successful.      The examined duodenum was normal. Impression:               - 7 cm hiatal hernia.                           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 Treated with argon plasma coagulation                            ([facility]).                           - Normal examined duodenum.                           - No specimens collected. Moderate Sedation:      Per Anesthesia Care Recommendation:           - Discharge patient to home (ambulatory).                           - Resume previous diet.                           - Use Prilosec (omeprazole) 40 mg PO BID for 4  weeks.                           - Follow up in GI clinic in 3 months - will repeat                            CBC and iron stores.                           - Continue oral iron.                           - If recurrent anemia/low iron stores, will proceed                            with EGD with GAVE banding. Procedure Code(s):        --- Professional ---                           [id number], Esophagogastroduodenoscopy, flexible,                            transoral; with control of bleeding, any method Diagnosis Code(s):        --- Professional ---                           K44.9, Diaphragmatic hernia without obstruction or                            gangrene                           K31.819, Angiodysplasia of stomach and  duodenum                            without bleeding                           D50.9, Iron deficiency anemia, unspecified CPT copyright 2022 [employer]. All rights reserved. The codes documented in this report are preliminary and upon coder review may  be revised to meet current compliance requirements. Katrinka Blazing, MD Katrinka Blazing,  11/03/2022 11:47:02 AM This report has been signed electronically. Number of Addenda: 0

## 2022-11-03 NOTE — Transfer of Care (Addendum)
Immediate Anesthesia Transfer of Care Note  Patient: Leslie Robbins  Procedure(s) Performed: ESOPHAGOGASTRODUODENOSCOPY (EGD) WITH PROPOFOL HOT HEMOSTASIS (ARGON PLASMA COAGULATION/BICAP)  Patient Location: Short Stay  Anesthesia Type:General  Level of Consciousness: drowsy and patient cooperative  Airway & Oxygen Therapy: Patient Spontanous Breathing and Patient connected to nasal cannula oxygen  Post-op Assessment: Report given to RN and Post -op Vital signs reviewed and stable  Post vital signs: Reviewed and stable  Last Vitals:  Vitals Value Taken Time  BP 90/55 11/03/22   1142  Temp 36.4 11/03/22   1142  Pulse 63 11/03/22   1142  Resp 15 11/03/22   1142  SpO2 95% 11/03/22   1142    Last Pain:  Vitals:   11/03/22 1120  TempSrc:   PainSc: 0-No pain      Patients Stated Pain Goal: 4 (11/03/22 1032)  Complications: No notable events documented.

## 2022-11-03 NOTE — H&P (Signed)
Leslie Robbins is an 67 y.o. female.   Chief Complaint: GAVE HPI: Leslie Robbins is a 67 y.o. female presenting today with a history of with history of anxiety, depression, HTN, HLD, hypothyroidism, GERD, adenomatous colon polyps, currently due for surveillance colonoscopy, presenting today for management of GAVE.  The patient denies having any nausea, vomiting, fever, chills, hematochezia, melena, hematemesis, abdominal distention, abdominal pain, diarrhea, jaundice, pruritus or weight loss.   Past Medical History:  Diagnosis Date   Anxiety    Arthritis    Depression    GERD (gastroesophageal reflux disease)    History of hiatal hernia    Hypercholesteremia    Hypertension    Hypothyroidism     Past Surgical History:  Procedure Laterality Date   ABDOMINAL HYSTERECTOMY     BIOPSY  08/31/2022   Procedure: BIOPSY;  Surgeon: Dolores Frame, MD;  [facility];  Service: Gastroenterology;;   BREAST LUMPECTOMY     benign   CARDIAC CATHETERIZATION     9 YRS AGO   COLONOSCOPY N/A 05/30/2012   Procedure: COLONOSCOPY;  Surgeon: Malissa Hippo, MD;  [facility];  Service: Endoscopy;  Laterality: N/A;  830-moved to 1125 Ann to notify pt   COLONOSCOPY WITH PROPOFOL N/A 08/31/2022   Procedure: COLONOSCOPY WITH PROPOFOL;  Surgeon: Dolores Frame, MD;  [facility];  Service: Gastroenterology;  Laterality: N/A;  12:30 pm, asa 2, pt knows to arrive at 8:45   DILATION AND CURETTAGE OF UTERUS     X2   ESOPHAGOGASTRODUODENOSCOPY (EGD) WITH PROPOFOL N/A 08/31/2022   Procedure: ESOPHAGOGASTRODUODENOSCOPY (EGD) WITH PROPOFOL;  Surgeon: Dolores Frame, MD;  [facility];  Service: Gastroenterology;  Laterality: N/A;   JOINT REPLACEMENT Right    right hip   Right Carpal Tunnel Release     ROBOTIC ASSISTED LAPAROSCOPIC CHOLECYSTECTOMY-MULTI SITE N/A 06/27/2022   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   Procedure: LEFT TOTAL KNEE  ARTHROPLASTY;  Surgeon: Loanne Drilling, MD;  Location: [facility];  Service: Orthopedics;  Laterality: Left;   TOTAL SHOULDER ARTHROPLASTY Right 06/17/2016   TOTAL SHOULDER ARTHROPLASTY Right 06/17/2016   Procedure: RIGHT TOTAL SHOULDER ARTHROPLASTY;  Surgeon: Beverely Low, MD;  Location: [facility] OR;  Service: Orthopedics;  Laterality: Right;   TUBAL LIGATION      Family History  Problem Relation Age of Onset   Dementia Mother    Hypertension Mother    Lupus Mother    Fibromyalgia Sister    Depression Sister    Bipolar disorder Daughter    Drug abuse Daughter    Breast cancer Maternal Aunt    Breast cancer Cousin    Breast cancer Cousin    Colon cancer Neg Hx    Social History:  reports that she has never smoked. She has never used smokeless tobacco. She reports that she does not drink alcohol and does not use drugs.  Allergies:  Allergies  Allergen Reactions   Sulfa Antibiotics Rash   Seroquel [Quetiapine] Other (See Comments)    Seroquel anger, hostility, increased anxiety   Sulfonamide Derivatives Rash    SULFONAMIDES INCREASE SENSITIVITY   TO SUNLIGHT > MORE LIKELY TO SUNBURN SUNBURN-LIKE REDNESS    Medications Prior to Admission  Medication Sig Dispense Refill   acetaminophen (TYLENOL) 650 MG CR tablet Take 1,300 mg by mouth every 8 (eight) hours as needed for pain.     albuterol (VENTOLIN HFA) 108 (90 Base) MCG/ACT inhaler INHALE 2 PUFFS BY  MOUTH EVERY 6 HOURS AS NEEDED FOR WHEEZE OR SHORTNESS OF BREATH 8.5 each 2   amLODipine (NORVASC) 5 MG tablet Take 1 tablet (5 mg total) by mouth daily. 90 tablet 2   aspirin EC 81 MG tablet Take 1 tablet (81 mg total) by mouth daily. 30 tablet 11   clorazepate (TRANXENE) 3.75 MG tablet Take 1 tablet (3.75 mg total) by mouth at bedtime as needed for anxiety. 30 tablet 5   DULoxetine (CYMBALTA) 60 MG capsule Take 1 capsule (60 mg total) by mouth 2 (two) times daily. 180 capsule 0   estradiol (ESTRACE) 0.1 MG/GM vaginal cream Place 1  Applicatorful vaginally every 3 (three) days. 42.5 g 11   fexofenadine (ALLEGRA) 180 MG tablet Take 1 tablet (180 mg total) by mouth daily. For allergy symptoms (Patient taking differently: Take 180 mg by mouth daily as needed for allergies.) 30 tablet 11   levothyroxine (SYNTHROID) 125 MCG tablet Take 1 tablet (125 mcg total) by mouth daily. 90 tablet 3   Omega-3 Fatty Acids (FISH OIL) 300 MG CAPS Take 1 capsule (300 mg total) by mouth daily.     omeprazole (PRILOSEC) 40 MG capsule Take 1 capsule (40 mg total) by mouth daily. 90 capsule 2   polyethylene glycol powder (GLYCOLAX/MIRALAX) 17 GM/SCOOP powder Take 17 g by mouth 2 (two) times daily as needed for moderate constipation. For constipation 3350 g 5   rosuvastatin (CRESTOR) 20 MG tablet Take 1 tablet (20 mg total) by mouth daily. 90 tablet 2   triamterene-hydrochlorothiazide (MAXZIDE-25) 37.5-25 MG tablet Take 1 tablet by mouth daily. As needed for swelling 90 tablet 2   ferrous sulfate (FE TABS) 325 (65 FE) MG EC tablet Take 1 tablet (325 mg total) by mouth daily with breakfast. (Patient taking differently: Take 325 mg by mouth every Monday, Wednesday, and Friday.) 90 tablet 3   fluticasone (FLONASE) 50 MCG/ACT nasal spray Place 2 sprays into both nostrils daily. 16 g 6    No results found for this or any previous visit (from the past 48 hour(s)). No results found.  Review of Systems  All other systems reviewed and are negative.   Blood pressure 123/87, pulse 80, temperature 99.1 F (37.3 C), temperature source Oral, resp. rate 20, height 5\' 4"  (1.626 m), weight 82.1 kg, SpO2 95%. Physical Exam  GENERAL: The patient is AO x3, in no acute distress. HEENT: Head is normocephalic and atraumatic. EOMI are intact. Mouth is well hydrated and without lesions. NECK: Supple. No masses LUNGS: Clear to auscultation. No presence of rhonchi/wheezing/rales. Adequate chest expansion HEART: RRR, normal s1 and s2. ABDOMEN: Soft, nontender, no  guarding, no peritoneal signs, and nondistended. BS +. No masses. EXTREMITIES: Without any cyanosis, clubbing, rash, lesions or edema. NEUROLOGIC: AOx3, no focal motor deficit. SKIN: no jaundice, no rashes  Assessment/Plan NICOLETA DEMMITT is a 67 y.o. female presenting today with a history of with history of anxiety, depression, HTN, HLD, hypothyroidism, GERD, adenomatous colon polyps, currently due for surveillance colonoscopy, presenting today for management of GAVE.  Will proceed with EGD and APC ablation.  Dolores Frame, MD 11/03/2022, 11:01 AM

## 2022-11-04 NOTE — Anesthesia Postprocedure Evaluation (Signed)
Anesthesia Post Note  Patient: Leslie Robbins  Procedure(s) Performed: ESOPHAGOGASTRODUODENOSCOPY (EGD) WITH PROPOFOL HOT HEMOSTASIS (ARGON PLASMA COAGULATION/BICAP)  Patient location during evaluation: Phase II Anesthesia Type: General Level of consciousness: awake Pain management: pain level controlled Vital Signs Assessment: post-procedure vital signs reviewed and stable Respiratory status: spontaneous breathing and respiratory function stable Cardiovascular status: blood pressure returned to baseline and stable Postop Assessment: no headache and no apparent nausea or vomiting Anesthetic complications: no Comments: Late entry   No notable events documented.   Last Vitals:  Vitals:   11/03/22 1142 11/03/22 1146  BP: (!) 90/55 91/67  Pulse: 63   Resp: 15   Temp: 36.4 C   SpO2: 95%     Last Pain:  Vitals:   11/03/22 1146  TempSrc:   PainSc: 0-No pain                 Windell Norfolk

## 2022-11-09 ENCOUNTER — Encounter (HOSPITAL_COMMUNITY): Payer: Self-pay | Admitting: Gastroenterology

## 2022-11-09 ENCOUNTER — Encounter (INDEPENDENT_AMBULATORY_CARE_PROVIDER_SITE_OTHER): Payer: Self-pay

## 2022-11-21 ENCOUNTER — Other Ambulatory Visit (INDEPENDENT_AMBULATORY_CARE_PROVIDER_SITE_OTHER): Payer: Self-pay | Admitting: Gastroenterology

## 2022-11-21 ENCOUNTER — Other Ambulatory Visit: Payer: Self-pay | Admitting: Family Medicine

## 2022-11-21 DIAGNOSIS — K219 Gastro-esophageal reflux disease without esophagitis: Secondary | ICD-10-CM

## 2022-12-04 ENCOUNTER — Other Ambulatory Visit: Payer: Self-pay | Admitting: Family Medicine

## 2022-12-05 ENCOUNTER — Other Ambulatory Visit: Payer: Self-pay | Admitting: Family Medicine

## 2022-12-11 ENCOUNTER — Other Ambulatory Visit (INDEPENDENT_AMBULATORY_CARE_PROVIDER_SITE_OTHER): Payer: Self-pay | Admitting: Gastroenterology

## 2022-12-11 DIAGNOSIS — K219 Gastro-esophageal reflux disease without esophagitis: Secondary | ICD-10-CM

## 2022-12-22 ENCOUNTER — Ambulatory Visit (INDEPENDENT_AMBULATORY_CARE_PROVIDER_SITE_OTHER): Payer: Medicare HMO | Admitting: Gastroenterology

## 2023-01-24 ENCOUNTER — Encounter (INDEPENDENT_AMBULATORY_CARE_PROVIDER_SITE_OTHER): Payer: Self-pay | Admitting: Gastroenterology

## 2023-01-24 ENCOUNTER — Ambulatory Visit (INDEPENDENT_AMBULATORY_CARE_PROVIDER_SITE_OTHER): Payer: Medicare HMO | Admitting: Gastroenterology

## 2023-01-24 VITALS — BP 112/80 | HR 71 | Temp 98.6°F | Ht 64.0 in | Wt 181.6 lb

## 2023-01-24 DIAGNOSIS — D509 Iron deficiency anemia, unspecified: Secondary | ICD-10-CM | POA: Diagnosis not present

## 2023-01-24 DIAGNOSIS — K219 Gastro-esophageal reflux disease without esophagitis: Secondary | ICD-10-CM | POA: Diagnosis not present

## 2023-01-24 DIAGNOSIS — K31819 Angiodysplasia of stomach and duodenum without bleeding: Secondary | ICD-10-CM | POA: Diagnosis not present

## 2023-01-24 DIAGNOSIS — K5900 Constipation, unspecified: Secondary | ICD-10-CM

## 2023-01-24 NOTE — Progress Notes (Signed)
Referring Provider: Mechele Claude, MD Primary Care Physician:  Mechele Claude, MD Primary GI Physician: Dr. Levon Hedger   Chief Complaint  Patient presents with   Gastroesophageal Reflux    Follow up on GERD.    HPI:   Leslie Robbins is a 67 y.o. female with past medical history of anxiety, depression, HTN, HLD, hypothyroidism, GERD, adenomatous colon polyps   Patient presenting today for follow up of GERD, IDA and Constipation  Last seen in May 2024 by Ermalinda Memos, PA, patient reported feeling better after gallbladder removal.  Reflux well-controlled on omeprazole 40 mg daily, bowels moving well with MiraLAX twice daily.  Patient taking iron 3 times per week and did discontinue meloxicam.  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 Repeat CBC and iron studies in 3 months, if anemia persistent, will need follow up EGD with GAVE banding  Last hgb in may was 13.8  Present: Patient notes that she had GB removed in April, feels symptoms have worsened since then with having some upper abdominal discomfort and belching with certain things she eats. No nausea or vomiting. She notes that she has had a few episodes where she will go have a BM and will vomit thereafter, this has been going on for some time though has only occurred once since GB removal. She does report she was having worsening constipation when this was happening more often previously. She is taking miralax every couple of days, having a BM usually every day. No rectal bleeding or melena. She is taking omeprazole 40mg  daily. Felt UGI symptoms may have been better on BID dosing of PPI. Still taking PO iron every other day. Denies SOB, fatigue. Has some dizziness upon standing sometimes.   Last Colonoscopy: 08/2022- The examined portion of the ileum  was normal.                           - One 4 mm polyp in the ascending colon, removed                            with a cold snare. Resected and retrieved.                           - One 1 mm polyp in the transverse colon, removed                            with a cold biopsy forceps. Resected and retrieved.                           - Two 3 to 5 mm polyps in the descending colon,                            removed with a cold snare. Resected and retrieved.                           - Diverticulosis in the sigmoid colon and in the  descending colon.                           - One 2 mm polyp in the rectum, removed with a cold                            snare. Resected and retrieved.                           - Non-bleeding internal hemorrhoids.  Last Endoscopy: - 10/2022 - 7 cm hiatal hernia.                           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 Treated with argon plasma coagulation                            (APC).                           - Normal examined duodenum.                           - No specimens collected.  4 colonic tubular adenomas and one hyperplastic polyp, normal small bowel biopsies, has gastric biopsies consistent with GAVE, repeat colonoscopy in 5 years.   Recommendations:    Past Medical History:  Diagnosis Date   Anxiety    Arthritis    Depression    GERD (gastroesophageal reflux disease)    History of hiatal hernia    Hypercholesteremia    Hypertension    Hypothyroidism     Past Surgical History:  Procedure Laterality Date   ABDOMINAL HYSTERECTOMY     BIOPSY  08/31/2022   Procedure: BIOPSY;  Surgeon: Dolores Frame, MD;  [facility];  Service: Gastroenterology;;   BREAST LUMPECTOMY     benign   CARDIAC CATHETERIZATION     9 YRS AGO   COLONOSCOPY N/A 05/30/2012   Procedure: COLONOSCOPY;  Surgeon: Malissa Hippo, MD;  [facility];  Service: Endoscopy;  Laterality: N/A;  830-moved to 1125 Ann to notify pt   COLONOSCOPY WITH PROPOFOL N/A 08/31/2022   Procedure: COLONOSCOPY WITH PROPOFOL;  Surgeon: Dolores Frame, MD;  [facility];  Service: Gastroenterology;  Laterality: N/A;  12:30 pm, asa 2, pt knows to arrive at 8:45   DILATION AND CURETTAGE OF UTERUS     X2   ESOPHAGOGASTRODUODENOSCOPY (EGD) WITH PROPOFOL N/A 08/31/2022   Procedure: ESOPHAGOGASTRODUODENOSCOPY (EGD) WITH PROPOFOL;  Surgeon: Dolores Frame, MD;  [facility];  Service: Gastroenterology;  Laterality: N/A;   ESOPHAGOGASTRODUODENOSCOPY (EGD) WITH PROPOFOL N/A 11/03/2022   Procedure: ESOPHAGOGASTRODUODENOSCOPY (EGD) WITH PROPOFOL;  Surgeon: Dolores Frame, MD;  [facility];  Service: Gastroenterology;  Laterality: N/A;  12:00pm;asa 1   HOT HEMOSTASIS  11/03/2022   Procedure: HOT HEMOSTASIS (ARGON PLASMA COAGULATION/BICAP);  Surgeon: Marguerita Merles, Reuel Boom, MD;  [facility];  Service: Gastroenterology;;   JOINT REPLACEMENT Right    right hip   Right Carpal Tunnel Release  ROBOTIC ASSISTED LAPAROSCOPIC CHOLECYSTECTOMY-MULTI SITE N/A 06/27/2022   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   Procedure: LEFT TOTAL KNEE ARTHROPLASTY;  Surgeon: Loanne Drilling, MD;  Location: [facility];  Service: Orthopedics;  Laterality: Left;   TOTAL SHOULDER ARTHROPLASTY Right 06/17/2016   TOTAL SHOULDER ARTHROPLASTY Right 06/17/2016   Procedure: RIGHT TOTAL SHOULDER ARTHROPLASTY;  Surgeon: Beverely Low, MD;  Location: [facility] OR;  Service: Orthopedics;  Laterality: Right;   TUBAL LIGATION      Current Outpatient Medications  Medication Sig Dispense Refill   acetaminophen (TYLENOL) 650 MG CR tablet Take 1,300 mg by mouth every 8 (eight) hours as needed for pain.     albuterol (VENTOLIN HFA) 108 (90 Base) MCG/ACT inhaler INHALE 2 PUFFS BY MOUTH EVERY 6 HOURS AS NEEDED FOR WHEEZE OR SHORTNESS OF  BREATH 8.5 each 2   amLODipine (NORVASC) 5 MG tablet Take 1 tablet (5 mg total) by mouth daily. 90 tablet 2   aspirin EC 81 MG tablet Take 1 tablet (81 mg total) by mouth daily. 30 tablet 11   clorazepate (TRANXENE) 3.75 MG tablet Take 1 tablet (3.75 mg total) by mouth at bedtime as needed for anxiety. 30 tablet 5   DULoxetine (CYMBALTA) 60 MG capsule Take 1 capsule (60 mg total) by mouth 2 (two) times daily. 180 capsule 0   estradiol (ESTRACE) 0.1 MG/GM vaginal cream Place 1 Applicatorful vaginally every 3 (three) days. 42.5 g 11   ferrous sulfate (FE TABS) 325 (65 FE) MG EC tablet Take 1 tablet (325 mg total) by mouth daily with breakfast. (Patient taking differently: Take 325 mg by mouth every Monday, Wednesday, and Friday.) 90 tablet 3   fexofenadine (ALLEGRA) 180 MG tablet TAKE 1 TABLET (180 MG TOTAL) BY MOUTH DAILY. FOR ALLERGY SYMPTOMS 90 tablet 0   fluticasone (FLONASE) 50 MCG/ACT nasal spray Place 2 sprays into both nostrils daily. 16 g 6   levothyroxine (SYNTHROID) 125 MCG tablet Take 1 tablet (125 mcg total) by mouth daily. 90 tablet 3   Omega-3 Fatty Acids (FISH OIL) 300 MG CAPS Take 1 capsule (300 mg total) by mouth daily.     omeprazole (PRILOSEC) 40 MG capsule Take 1 capsule (40 mg total) by mouth daily. 30 capsule 5   polyethylene glycol powder (GLYCOLAX/MIRALAX) 17 GM/SCOOP powder Take 17 g by mouth 2 (two) times daily as needed for moderate constipation. For constipation 3350 g 5   rosuvastatin (CRESTOR) 20 MG tablet Take 1 tablet (20 mg total) by mouth daily. 90 tablet 2   triamterene-hydrochlorothiazide (MAXZIDE-25) 37.5-25 MG tablet Take 1 tablet by mouth daily. As needed for swelling 90 tablet 2   No current facility-administered medications for this visit.    Allergies as of 01/24/2023 - Review Complete 01/24/2023  Allergen Reaction Noted   Sulfa antibiotics Rash 09/16/2021   Seroquel [quetiapine] Other (See Comments) 03/30/2021   Sulfonamide derivatives Rash      Family History  Problem Relation Age of Onset   Dementia Mother    Hypertension Mother    Lupus Mother    Fibromyalgia Sister    Depression Sister    Bipolar disorder Daughter    Drug abuse Daughter    Breast cancer Maternal Aunt    Breast cancer Cousin    Breast cancer Cousin    Colon cancer Neg Hx     Social History   Socioeconomic History   Marital status: Married    Spouse name: Casimiro Needle   Number of children: 3   Years of  education: Not on file   Highest education level: Some college, no degree  Occupational History   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  Tobacco Use   Smoking status: Never   Smokeless tobacco: Never  Vaping Use   Vaping status: Never Used  Substance and Sexual Activity   Alcohol use: No   Drug use: No   Sexual activity: Yes    Birth control/protection: Surgical    Comment: hyst  Other Topics Concern   Not on file  Social History Narrative   3 grandchildren live with her and her husband full time - she has been their full time caregiver since 2019   One of her daughters lives nearby   Social Determinants of Health   Financial Resource Strain: Medium Risk (08/03/2022)   Overall Financial Resource Strain (CARDIA)    Difficulty of Paying Living Expenses: Somewhat hard  Food Insecurity: No Food Insecurity (08/03/2022)   Hunger Vital Sign    Worried About Running Out of Food in the Last Year: Never true    Ran Out of Food in the Last Year: Never true  Transportation Needs: No Transportation Needs (08/03/2022)   PRAPARE - Administrator, Civil Service (Medical): No    Lack of Transportation (Non-Medical): No  Physical Activity: Inactive (08/03/2022)   Exercise Vital Sign    Days of Exercise per Week: 0 days    Minutes of Exercise per Session: 30 min  Stress: Stress Concern Present (08/03/2022)   [employer] of Occupational Health - Occupational Stress Questionnaire    Feeling of Stress : To some extent  Social  Connections: Unknown (08/03/2022)   Social Connection and Isolation Panel [NHANES]    Frequency of Communication with Friends and Family: More than three times a week    Frequency of Social Gatherings with Friends and Family: Once a week    Attends Religious Services: Patient declined    Database administrator or Organizations: No    Attends Banker Meetings: Never    Marital Status: Married  Recent Concern: Social Connections - Moderately Isolated (06/21/2022)   Social Connection and Isolation Panel [NHANES]    Frequency of Communication with Friends and Family: More than three times a week    Frequency of Social Gatherings with Friends and Family: More than three times a week    Attends Religious Services: Never    Database administrator or Organizations: No    Attends Engineer, structural: Never    Marital Status: Married    Review of systems General: negative for malaise, night sweats, fever, chills, weight loss Neck: Negative for lumps, goiter, pain and significant neck swelling Resp: Negative for cough, wheezing, dyspnea at rest CV: Negative for chest pain, leg swelling, palpitations, orthopnea GI: denies melena, hematochezia, nausea,  diarrhea, constipation, dysphagia, odyonophagia, early satiety or unintentional weight loss. +upper abdominal discomfort/belching  MSK: Negative for joint pain or swelling, back pain, and muscle pain. Derm: Negative for itching or rash Psych: Denies depression, anxiety, memory loss, confusion. No homicidal or suicidal ideation.  Heme: Negative for prolonged bleeding, bruising easily, and swollen nodes. Endocrine: Negative for cold or heat intolerance, polyuria, polydipsia and goiter. Neuro: negative for tremor, gait imbalance, syncope and seizures. The remainder of the review of systems is noncontributory.  Physical Exam: There were no vitals taken for this visit. General:   Alert and oriented. No distress noted. Pleasant and  cooperative.  Head:  Normocephalic and atraumatic. Eyes:  Conjuctiva clear without scleral icterus. Mouth:  Oral mucosa pink and moist. Good dentition. No lesions. Heart: Normal rate and rhythm, s1 and s2 heart sounds present.  Lungs: Clear lung sounds in all lobes. Respirations equal and unlabored. Abdomen:  +BS, soft, non-tender and non-distended. No rebound or guarding. No HSM or masses noted. Derm: No palmar erythema or jaundice Msk:  Symmetrical without gross deformities. Normal posture. Extremities:  Without edema. Neurologic:  Alert and  oriented x4 Psych:  Alert and cooperative. Normal mood and affect.  Invalid input(s): "6 MONTHS"   ASSESSMENT: Leslie Robbins is a 67 y.o. 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 Last hgb 13.8 in may. She is taking PO iron every other day.  Denies rectal bleeding or melena.  Denies shortness of breath or fatigue.  Will update H&H and iron studies.  She should continue with p.o. iron as she is doing.  Will need to proceed with repeat upper endoscopy with GAVE banding if anemia persistent.  GERD: Denies heartburn or acid regurgitation though is having some upper abdominal discomfort and belching especially with spicy or greasy foods, notices seems to be worse since gallbladder movable.  She does feel that symptoms may have been better on twice daily dosing of PPI.  At this time recommend she increase omeprazole 40 mg to twice daily dosing.  Constipation: Well-managed with MiraLAX she is using this few times per week.  Encouraged to continue with MiraLAX as she is doing, increase water intake, diet high in fruits, veggies, whole grains.  PLAN:  H&h, Iron studies  2. Increase omeprazole 40mg  to BID  3. Continue PO iron  4. Continue miralax  5. Increase water intake, aim for atleast 64 oz per day Increase fruits, veggies and whole grains, kiwi and prunes are especially good  for constipation   All questions were answered, patient verbalized understanding and is in agreement with plan as outlined above.    Follow Up: 4 months   Jairy Angulo L. Jeanmarie Hubert, MSN, APRN, AGNP-C Adult-Gerontology Nurse Practitioner [facility]  I have reviewed the note and agree with the APP's assessment as described in this progress note  Katrinka Blazing, MD Gastroenterology and Hepatology [facility] Gastroenterology

## 2023-01-24 NOTE — Patient Instructions (Signed)
Please increase omeprazole 40 mg to twice a day Be mindful of greasy, spicy, citrus/tomato-based foods, caffeine, chocolate, alcohol as these can increase reflux symptoms.  Stay upright 2 to 3 hours after eating prior to lying down.  Continue with iron pill every other day, we will update blood counts and iron stores at this time.  Continue with MiraLAX as you are doing. Increase water intake, aim for atleast 64 oz per day Increase fruits, veggies and whole grains, kiwi and prunes are especially good for constipation   Follow-up 4 months  It was a pleasure to see you today. I want to create trusting relationships with patients and provide genuine, compassionate, and quality care. I truly value your feedback! please be on the lookout for a survey regarding your visit with me today. I appreciate your input about our visit and your time in completing this!    Medard Decuir L. Jeanmarie Hubert, MSN, APRN, AGNP-C Adult-Gerontology Nurse Practitioner [facility]

## 2023-01-25 ENCOUNTER — Encounter: Payer: Self-pay | Admitting: *Deleted

## 2023-01-25 LAB — IRON,TIBC AND FERRITIN PANEL
%SAT: 12 % — ABNORMAL LOW (ref 16–45)
Ferritin: 50 ng/mL (ref 16–288)
Iron: 41 ug/dL — ABNORMAL LOW (ref 45–160)
TIBC: 349 ug/dL (ref 250–450)

## 2023-01-25 LAB — HEMOGLOBIN AND HEMATOCRIT, BLOOD
HCT: 46.1 % — ABNORMAL HIGH (ref 35.0–45.0)
Hemoglobin: 14.8 g/dL (ref 11.7–15.5)

## 2023-01-27 ENCOUNTER — Telehealth (INDEPENDENT_AMBULATORY_CARE_PROVIDER_SITE_OTHER): Payer: Self-pay | Admitting: *Deleted

## 2023-01-27 NOTE — Telephone Encounter (Signed)
Noted-she is in my January folder

## 2023-01-27 NOTE — Telephone Encounter (Signed)
Patient made aware that per [facility] wanted to let you know  that iron levels are still low. Would encourage you to increase iron and try taking this day if you can tolerate. We will also need to schedule you for repeat EGD to try and treat the GAVE you have in your stomach as this is the source of her IDA.   If you are  amenable, can we schedule EGD with GAVE Banding with Dr. Salena Saner, ASA II, diagnosis is IDA and GAVE. Patient states understanding and says she would like to wait until after Christmas to have these procedures.

## 2023-01-27 NOTE — Telephone Encounter (Signed)
Thanks

## 2023-01-27 NOTE — Telephone Encounter (Signed)
Copied mychart message below because patient has not read message and I called her this morning to discuss and left her a message to return call.   Leslie Robbins wanted to let you know  that iron levels are still low. Would encourage you to increase iron and try taking this day if you can tolerate. We will also need to schedule you for repeat EGD to try and treat the GAVE you have in your stomach as this is the source of her IDA.   If you are  amenable, can we schedule EGD with GAVE Banding with Dr. Salena Saner, ASA II, diagnosis is IDA and GAVE.

## 2023-02-06 ENCOUNTER — Encounter: Payer: Self-pay | Admitting: Family Medicine

## 2023-02-06 ENCOUNTER — Ambulatory Visit (INDEPENDENT_AMBULATORY_CARE_PROVIDER_SITE_OTHER): Payer: Medicare HMO | Admitting: Family Medicine

## 2023-02-06 ENCOUNTER — Telehealth (INDEPENDENT_AMBULATORY_CARE_PROVIDER_SITE_OTHER): Payer: Self-pay

## 2023-02-06 VITALS — BP 131/88 | HR 70 | Temp 97.3°F | Ht 64.0 in | Wt 179.2 lb

## 2023-02-06 DIAGNOSIS — E039 Hypothyroidism, unspecified: Secondary | ICD-10-CM

## 2023-02-06 DIAGNOSIS — D509 Iron deficiency anemia, unspecified: Secondary | ICD-10-CM

## 2023-02-06 DIAGNOSIS — E785 Hyperlipidemia, unspecified: Secondary | ICD-10-CM | POA: Diagnosis not present

## 2023-02-06 DIAGNOSIS — I10 Essential (primary) hypertension: Secondary | ICD-10-CM | POA: Diagnosis not present

## 2023-02-06 DIAGNOSIS — K219 Gastro-esophageal reflux disease without esophagitis: Secondary | ICD-10-CM | POA: Diagnosis not present

## 2023-02-06 DIAGNOSIS — Z23 Encounter for immunization: Secondary | ICD-10-CM

## 2023-02-06 DIAGNOSIS — K31819 Angiodysplasia of stomach and duodenum without bleeding: Secondary | ICD-10-CM

## 2023-02-06 DIAGNOSIS — F418 Other specified anxiety disorders: Secondary | ICD-10-CM | POA: Diagnosis not present

## 2023-02-06 DIAGNOSIS — J324 Chronic pansinusitis: Secondary | ICD-10-CM | POA: Diagnosis not present

## 2023-02-06 MED ORDER — OMEPRAZOLE 40 MG PO CPDR
40.0000 mg | DELAYED_RELEASE_CAPSULE | Freq: Two times a day (BID) | ORAL | 3 refills | Status: DC
Start: 2023-02-06 — End: 2023-03-02

## 2023-02-06 MED ORDER — BETAMETHASONE SOD PHOS & ACET 6 (3-3) MG/ML IJ SUSP
6.0000 mg | Freq: Once | INTRAMUSCULAR | Status: AC
Start: 2023-02-06 — End: 2023-02-06
  Administered 2023-02-06: 6 mg via INTRAMUSCULAR

## 2023-02-06 MED ORDER — CLORAZEPATE DIPOTASSIUM 3.75 MG PO TABS
3.7500 mg | ORAL_TABLET | Freq: Every evening | ORAL | 5 refills | Status: DC | PRN
Start: 2023-02-06 — End: 2023-08-07

## 2023-02-06 NOTE — Telephone Encounter (Signed)
Spoke with pt. Pt states there is a form that can be filled out to waive her copay. I have held a time on 03/02/23 for EGD. I spoke with someone at [facility] about form but representative I spoke with was rude and unprofessional. She was going to transfer me and then came back on the line and said "ma'am you can call them now". Contacted pt back and advised her what happened; since it is the pt responsibility to verify insurance, I have asked pt to call insurance and have them fax [place] the form over. Pt verbalized understanding. Will finish booking case once I hear back from patient.

## 2023-02-06 NOTE — Telephone Encounter (Signed)
Leslie Robbins, this patient has called in today regarding this. Looks like she is wanting something done before the end of the year.

## 2023-02-06 NOTE — Progress Notes (Signed)
Subjective:  Patient ID: Leslie Robbins, female    DOB: Jul 08, 1955  Age: 67 y.o. MRN: [id number]  CC: Medical Management of Chronic Issues   HPI Leslie Robbins presents for  follow-up on  thyroid. The patient has a history of hypothyroidism for many years. It has been stable recently. Pt. denies any change in  voice, loss of hair, heat or cold intolerance. Energy level has been adequate to poor. Patient denies constipation and diarrhea. No myxedema. Medication is as noted below. Verified that pt is taking it daily on an empty stomach. Well tolerated.If she goes below 125 of levothyroxine, energy goes to nothing.    presents for  follow-up of hypertension. Patient has no history of headache chest pain or shortness of breath or recent cough. Patient also denies symptoms of TIA such as focal numbness or weakness. Patient denies side effects from medication. States taking it regularly.     02/06/2023    9:18 AM 08/03/2022   10:10 AM 05/13/2022   12:33 PM 02/02/2022   10:31 AM  GAD 7 : Generalized Anxiety Score  Nervous, Anxious, on Edge 1 1 1 2   Control/stop worrying 1 1 3 1   Worry too much - different things 1 1 1 1   Trouble relaxing 0 0 0 0  Restless 0 0 0 0  Easily annoyed or irritable 1 0 3 2  Afraid - awful might happen 0 0 1 0  Total GAD 7 Score 4 3 9 6   Anxiety Difficulty Not difficult at all Somewhat difficult Somewhat difficult Not difficult at all          02/06/2023    9:18 AM 02/06/2023    9:11 AM 08/03/2022   10:10 AM  Depression screen PHQ 2/9  Decreased Interest 0 0 0  Down, Depressed, Hopeless 0 0 0  PHQ - 2 Score 0 0 0  Altered sleeping 2  1  Tired, decreased energy 2  1  Change in appetite 0  0  Feeling bad or failure about yourself  0  0  Trouble concentrating 0  0  Moving slowly or fidgety/restless 0  0  Suicidal thoughts 0  0  PHQ-9 Score 4  2  Difficult doing work/chores Not difficult at all  Somewhat difficult    History Leslie Robbins has a past medical history  of Anxiety, Arthritis, Depression, GERD (gastroesophageal reflux disease), History of hiatal hernia, Hypercholesteremia, Hypertension, and Hypothyroidism.   She has a past surgical history that includes Abdominal hysterectomy; Tubal ligation; Right Carpal Tunnel Release; Breast lumpectomy; Colonoscopy (N/A, 05/30/2012); Dilation and curettage of uterus; Cardiac catheterization; Total knee arthroplasty (Left, 09/16/2013); Joint replacement (Right); Total shoulder arthroplasty (Right, 06/17/2016); Total shoulder arthroplasty (Right, 06/17/2016); Robotic assisted laparoscopic cholecystectomy-multi site (N/A, 06/27/2022); Colonoscopy with propofol (N/A, 08/31/2022); Esophagogastroduodenoscopy (egd) with propofol (N/A, 08/31/2022); biopsy (08/31/2022); Esophagogastroduodenoscopy (egd) with propofol (N/A, 11/03/2022); and Hot hemostasis (11/03/2022).   Her family history includes Bipolar disorder in her daughter; Breast cancer in her cousin, cousin, and maternal aunt; Dementia in her mother; Depression in her sister; Drug abuse in her daughter; Fibromyalgia in her sister; Hypertension in her mother; Lupus in her mother.She reports that she has never smoked. She has never used smokeless tobacco. She reports that she does not drink alcohol and does not use drugs.    ROS Review of Systems  Constitutional: Negative.   HENT:  Positive for congestion and sinus pressure (around eyes).   Eyes:  Negative for visual disturbance.  Respiratory:  Negative for shortness of breath.   Cardiovascular:  Negative for chest pain.  Gastrointestinal:  Negative for abdominal pain.  Musculoskeletal:  Negative for arthralgias.    Objective:  BP 131/88   Pulse 70   Temp (!) 97.3 F (36.3 C)   Ht 5\' 4"  (1.626 m)   Wt 179 lb 3.2 oz (81.3 kg)   SpO2 96%   BMI 30.76 kg/m   BP Readings from Last 3 Encounters:  02/06/23 131/88  01/24/23 112/80  11/03/22 91/67    Wt Readings from Last 3 Encounters:  02/06/23 179 lb 3.2 oz  (81.3 kg)  01/24/23 181 lb 9.6 oz (82.4 kg)  11/03/22 181 lb (82.1 kg)     Physical Exam Constitutional:      General: She is not in acute distress.    Appearance: She is well-developed.  Cardiovascular:     Rate and Rhythm: Normal rate and regular rhythm.  Pulmonary:     Breath sounds: Normal breath sounds.  Musculoskeletal:        General: Normal range of motion.  Skin:    General: Skin is warm and dry.  Neurological:     Mental Status: She is alert and oriented to person, place, and time.       Assessment & Plan:   Leslie Robbins was seen today for medical management of chronic issues.  Diagnoses and all orders for this visit:  Essential hypertension -     CBC with Differential/Platelet -     CMP14+EGFR  Hypothyroidism, unspecified type -     TSH + free T4  Hyperlipidemia with target LDL less than 100 -     Lipid panel  Need for Tdap vaccination -     Tdap vaccine greater than or equal to 7yo IM  Need for influenza vaccination -     Flu Vaccine Trivalent High Dose (Fluad)  Depression with anxiety -     clorazepate (TRANXENE) 3.75 MG tablet; Take 1 tablet (3.75 mg total) by mouth at bedtime as needed for anxiety.  Gastroesophageal reflux disease without esophagitis -     omeprazole (PRILOSEC) 40 MG capsule; Take 1 capsule (40 mg total) by mouth in the morning and at bedtime.  Chronic pansinusitis -     betamethasone acetate-betamethasone sodium phosphate (CELESTONE) injection 6 mg       I have changed Leslie Robbins's omeprazole. I am also having her maintain her polyethylene glycol powder, fluticasone, ferrous sulfate, acetaminophen, aspirin EC, amLODipine, DULoxetine, rosuvastatin, triamterene-hydrochlorothiazide, estradiol, levothyroxine, albuterol, fexofenadine, and clorazepate. We will continue to administer betamethasone acetate-betamethasone sodium phosphate.  Allergies as of 02/06/2023       Reactions   Sulfa Antibiotics Rash   Seroquel [quetiapine]  Other (See Comments)   Seroquel anger, hostility, increased anxiety   Sulfonamide Derivatives Rash   SULFONAMIDES INCREASE SENSITIVITY   TO SUNLIGHT > MORE LIKELY TO SUNBURN SUNBURN-LIKE REDNESS        Medication List        Accurate as of February 06, 2023  9:43 AM. If you have any questions, ask your nurse or doctor.          acetaminophen 650 MG CR tablet Commonly known as: TYLENOL Take 1,300 mg by mouth every 8 (eight) hours as needed for pain.   albuterol 108 (90 Base) MCG/ACT inhaler Commonly known as: VENTOLIN HFA INHALE 2 PUFFS BY MOUTH EVERY 6 HOURS AS NEEDED FOR WHEEZE OR SHORTNESS OF BREATH   amLODipine 5 MG tablet Commonly known  as: NORVASC Take 1 tablet (5 mg total) by mouth daily.   aspirin EC 81 MG tablet Take 1 tablet (81 mg total) by mouth daily.   clorazepate 3.75 MG tablet Commonly known as: TRANXENE Take 1 tablet (3.75 mg total) by mouth at bedtime as needed for anxiety.   DULoxetine 60 MG capsule Commonly known as: CYMBALTA Take 1 capsule (60 mg total) by mouth 2 (two) times daily.   estradiol 0.1 MG/GM vaginal cream Commonly known as: ESTRACE Place 1 Applicatorful vaginally every 3 (three) days.   ferrous sulfate 325 (65 FE) MG EC tablet Commonly known as: Fe Tabs Take 1 tablet (325 mg total) by mouth daily with breakfast. What changed: when to take this   fexofenadine 180 MG tablet Commonly known as: ALLEGRA TAKE 1 TABLET (180 MG TOTAL) BY MOUTH DAILY. FOR ALLERGY SYMPTOMS   fluticasone 50 MCG/ACT nasal spray Commonly known as: FLONASE Place 2 sprays into both nostrils daily.   levothyroxine 125 MCG tablet Commonly known as: SYNTHROID Take 1 tablet (125 mcg total) by mouth daily.   omeprazole 40 MG capsule Commonly known as: PRILOSEC Take 1 capsule (40 mg total) by mouth in the morning and at bedtime.   polyethylene glycol powder 17 GM/SCOOP powder Commonly known as: GLYCOLAX/MIRALAX Take 17 g by mouth 2 (two) times daily as  needed for moderate constipation. For constipation   rosuvastatin 20 MG tablet Commonly known as: CRESTOR Take 1 tablet (20 mg total) by mouth daily.   triamterene-hydrochlorothiazide 37.5-25 MG tablet Commonly known as: MAXZIDE-25 Take 1 tablet by mouth daily. As needed for swelling         Follow-up: Return in about 6 months (around 08/07/2023).  Mechele Claude, M.D.

## 2023-02-06 NOTE — Telephone Encounter (Signed)
Hey Crystal. Last time I spoke with pt (11/22//24) she wanted to wait until after Christmas to have procedure done. She is in my January folder.

## 2023-02-06 NOTE — Telephone Encounter (Signed)
I called and spoke with the patient and made her aware we may not have any availability before the end of the year, unless someone cancels. Patient would like to speak with you, I have transferred her to your phone line vm. Thanks

## 2023-02-06 NOTE — Telephone Encounter (Signed)
Pt left voicemail stating that she spoke with insurance and they do not have a form to fill out. Pt states to go ahead and schedule her and she will make payments. Will mail instructions to pt once pre op appt is received. No PA needed per insurance.

## 2023-02-07 ENCOUNTER — Other Ambulatory Visit: Payer: Self-pay | Admitting: Family Medicine

## 2023-02-07 ENCOUNTER — Encounter: Payer: Self-pay | Admitting: Family Medicine

## 2023-02-07 DIAGNOSIS — E785 Hyperlipidemia, unspecified: Secondary | ICD-10-CM

## 2023-02-07 LAB — CMP14+EGFR
ALT: 24 IU/L (ref 0–32)
AST: 28 IU/L (ref 0–40)
Albumin: 4.3 g/dL (ref 3.9–4.9)
Alkaline Phosphatase: 205 IU/L — ABNORMAL HIGH (ref 44–121)
BUN/Creatinine Ratio: 23 (ref 12–28)
BUN: 30 mg/dL — ABNORMAL HIGH (ref 8–27)
Bilirubin Total: 0.6 mg/dL (ref 0.0–1.2)
CO2: 23 mmol/L (ref 20–29)
Calcium: 11 mg/dL — ABNORMAL HIGH (ref 8.7–10.3)
Chloride: 99 mmol/L (ref 96–106)
Creatinine, Ser: 1.31 mg/dL — ABNORMAL HIGH (ref 0.57–1.00)
Globulin, Total: 2.9 g/dL (ref 1.5–4.5)
Glucose: 89 mg/dL (ref 70–99)
Potassium: 4.1 mmol/L (ref 3.5–5.2)
Sodium: 139 mmol/L (ref 134–144)
Total Protein: 7.2 g/dL (ref 6.0–8.5)
eGFR: 44 mL/min/{1.73_m2} — ABNORMAL LOW (ref >59)

## 2023-02-07 LAB — CBC WITH DIFFERENTIAL/PLATELET
Basophils Absolute: 0.1 10*3/uL (ref 0.0–0.2)
Basos: 1 %
EOS (ABSOLUTE): 0.4 10*3/uL (ref 0.0–0.4)
Eos: 6 %
Hematocrit: 48.6 % — ABNORMAL HIGH (ref 34.0–46.6)
Hemoglobin: 15.4 g/dL (ref 11.1–15.9)
Immature Grans (Abs): 0 10*3/uL (ref 0.0–0.1)
Immature Granulocytes: 0 %
Lymphocytes Absolute: 1.5 10*3/uL (ref 0.7–3.1)
Lymphs: 23 %
MCH: 28.6 pg (ref 26.6–33.0)
MCHC: 31.7 g/dL (ref 31.5–35.7)
MCV: 90 fL (ref 79–97)
Monocytes Absolute: 0.5 10*3/uL (ref 0.1–0.9)
Monocytes: 7 %
Neutrophils Absolute: 4 10*3/uL (ref 1.4–7.0)
Neutrophils: 63 %
Platelets: 292 10*3/uL (ref 150–450)
RBC: 5.38 x10E6/uL — ABNORMAL HIGH (ref 3.77–5.28)
RDW: 14.4 % (ref 11.7–15.4)
WBC: 6.4 10*3/uL (ref 3.4–10.8)

## 2023-02-07 LAB — LIPID PANEL
Cholesterol, Total: 223 mg/dL — ABNORMAL HIGH (ref 100–199)
HDL: 57 mg/dL (ref >39)
LDL CALC COMMENT:: 3.9 ratio (ref 0.0–4.4)
LDL Chol Calc (NIH): 138 mg/dL — ABNORMAL HIGH (ref 0–99)
Triglycerides: 158 mg/dL — ABNORMAL HIGH (ref 0–149)
VLDL Cholesterol Cal: 28 mg/dL (ref 5–40)

## 2023-02-07 LAB — TSH+FREE T4
Free T4: 1.34 ng/dL (ref 0.82–1.77)
TSH: 0.291 u[IU]/mL — ABNORMAL LOW (ref 0.450–4.500)

## 2023-02-07 MED ORDER — ROSUVASTATIN CALCIUM 40 MG PO TABS
40.0000 mg | ORAL_TABLET | Freq: Every day | ORAL | 3 refills | Status: DC
Start: 2023-02-07 — End: 2023-10-27

## 2023-02-23 ENCOUNTER — Other Ambulatory Visit: Payer: Self-pay | Admitting: Family Medicine

## 2023-02-23 DIAGNOSIS — Z1231 Encounter for screening mammogram for malignant neoplasm of breast: Secondary | ICD-10-CM

## 2023-02-24 ENCOUNTER — Encounter (HOSPITAL_COMMUNITY)
Admission: RE | Admit: 2023-02-24 | Discharge: 2023-02-24 | Disposition: A | Discharge status: Home / Self Care | Payer: Medicare HMO | Source: Ambulatory Visit | Attending: Gastroenterology | Admitting: Gastroenterology

## 2023-02-24 ENCOUNTER — Other Ambulatory Visit: Payer: Self-pay

## 2023-02-24 ENCOUNTER — Encounter (HOSPITAL_COMMUNITY): Payer: Self-pay

## 2023-02-24 ENCOUNTER — Ambulatory Visit
Admission: RE | Admit: 2023-02-24 | Discharge: 2023-02-24 | Disposition: A | Discharge status: Home / Self Care | Payer: Medicare HMO | Source: Ambulatory Visit | Attending: Family Medicine | Admitting: Family Medicine

## 2023-02-24 DIAGNOSIS — Z1231 Encounter for screening mammogram for malignant neoplasm of breast: Secondary | ICD-10-CM | POA: Diagnosis not present

## 2023-02-27 ENCOUNTER — Other Ambulatory Visit (HOSPITAL_COMMUNITY)
Admission: RE | Admit: 2023-02-27 | Discharge: 2023-02-27 | Disposition: A | Discharge status: Home / Self Care | Payer: Medicare HMO | Source: Ambulatory Visit | Attending: Gastroenterology | Admitting: Gastroenterology

## 2023-02-27 DIAGNOSIS — K31819 Angiodysplasia of stomach and duodenum without bleeding: Secondary | ICD-10-CM | POA: Diagnosis not present

## 2023-02-27 DIAGNOSIS — D509 Iron deficiency anemia, unspecified: Secondary | ICD-10-CM | POA: Insufficient documentation

## 2023-02-27 LAB — BASIC METABOLIC PANEL
Anion gap: 7 (ref 5–15)
BUN: 20 mg/dL (ref 8–23)
CO2: 27 mmol/L (ref 22–32)
Calcium: 9.8 mg/dL (ref 8.9–10.3)
Chloride: 99 mmol/L (ref 98–111)
Creatinine, Ser: 1.14 mg/dL — ABNORMAL HIGH (ref 0.44–1.00)
GFR, Estimated: 52 mL/min — ABNORMAL LOW (ref >60)
Glucose, Bld: 89 mg/dL (ref 70–99)
Potassium: 4.1 mmol/L (ref 3.5–5.1)
Sodium: 133 mmol/L — ABNORMAL LOW (ref 135–145)

## 2023-03-02 ENCOUNTER — Encounter (HOSPITAL_COMMUNITY)
Admission: RE | Disposition: A | Discharge status: Home / Self Care | Payer: Self-pay | Source: Home / Self Care | Attending: Gastroenterology

## 2023-03-02 ENCOUNTER — Ambulatory Visit (HOSPITAL_COMMUNITY): Payer: Medicare HMO | Admitting: Anesthesiology

## 2023-03-02 ENCOUNTER — Encounter (HOSPITAL_COMMUNITY): Payer: Self-pay | Admitting: Gastroenterology

## 2023-03-02 ENCOUNTER — Ambulatory Visit (HOSPITAL_COMMUNITY)
Admission: RE | Admit: 2023-03-02 | Discharge: 2023-03-02 | Disposition: A | Discharge status: Home / Self Care | Payer: Medicare HMO | Attending: Gastroenterology | Admitting: Gastroenterology

## 2023-03-02 DIAGNOSIS — E039 Hypothyroidism, unspecified: Secondary | ICD-10-CM | POA: Diagnosis not present

## 2023-03-02 DIAGNOSIS — B3781 Candidal esophagitis: Secondary | ICD-10-CM | POA: Insufficient documentation

## 2023-03-02 DIAGNOSIS — K449 Diaphragmatic hernia without obstruction or gangrene: Secondary | ICD-10-CM | POA: Diagnosis not present

## 2023-03-02 DIAGNOSIS — K219 Gastro-esophageal reflux disease without esophagitis: Secondary | ICD-10-CM | POA: Insufficient documentation

## 2023-03-02 DIAGNOSIS — I10 Essential (primary) hypertension: Secondary | ICD-10-CM | POA: Diagnosis not present

## 2023-03-02 DIAGNOSIS — K2289 Other specified disease of esophagus: Secondary | ICD-10-CM | POA: Insufficient documentation

## 2023-03-02 DIAGNOSIS — K31819 Angiodysplasia of stomach and duodenum without bleeding: Secondary | ICD-10-CM | POA: Insufficient documentation

## 2023-03-02 HISTORY — PX: ESOPHAGOGASTRODUODENOSCOPY (EGD) WITH PROPOFOL: SHX5813

## 2023-03-02 HISTORY — PX: ESOPHAGEAL BANDING: SHX5518

## 2023-03-02 HISTORY — PX: BIOPSY: SHX5522

## 2023-03-02 SURGERY — ESOPHAGOGASTRODUODENOSCOPY (EGD) WITH PROPOFOL
Anesthesia: General

## 2023-03-02 MED ORDER — OMEPRAZOLE 40 MG PO CPDR: 40.0000 mg | DELAYED_RELEASE_CAPSULE | Freq: Two times a day (BID) | ORAL | 1 refills | Status: DC

## 2023-03-02 MED ORDER — LIDOCAINE HCL (CARDIAC) PF 100 MG/5ML IV SOSY
PREFILLED_SYRINGE | INTRAVENOUS | Status: DC | PRN
Start: 2023-03-02 — End: 2023-03-02
  Administered 2023-03-02: 100 mg via INTRATRACHEAL

## 2023-03-02 MED ORDER — PROPOFOL 500 MG/50ML IV EMUL
INTRAVENOUS | Status: DC | PRN
  Administered 2023-03-02: 150 ug/kg/min via INTRAVENOUS

## 2023-03-02 MED ORDER — LACTATED RINGERS IV SOLN: INTRAVENOUS | Status: DC | PRN

## 2023-03-02 MED ORDER — PROPOFOL 10 MG/ML IV BOLUS
INTRAVENOUS | Status: DC | PRN
  Administered 2023-03-02: 50 mg via INTRAVENOUS
  Administered 2023-03-02: 100 mg via INTRAVENOUS

## 2023-03-02 NOTE — H&P (Signed)
Leslie Robbins is an 67 y.o. female.   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  Patient reports that she has been feeling well.  No melena, hematochezia, New onset abdominal pain, nausea, vomiting, fever or chills. she endorses having intermittent episodes of pain for multiple years but no new pain.  Past Medical History:  Diagnosis Date   Anxiety    Arthritis    Depression    GERD (gastroesophageal reflux disease)    History of hiatal hernia    Hypercholesteremia    Hypertension    Hypothyroidism    Pain management contract signed 02/06/2017   The current plan is that she will use hydrocodone for pain management until the time of her surgery which is planned for approximately 2 months from now.      Past Surgical History:  Procedure Laterality Date   ABDOMINAL HYSTERECTOMY     BIOPSY  08/31/2022   Procedure: BIOPSY;  Surgeon: Marguerita Merles, Reuel Boom, MD;  Location: [facility];  Service: Gastroenterology;;   BREAST LUMPECTOMY     benign   CARDIAC CATHETERIZATION     9 YRS AGO   COLONOSCOPY N/A 05/30/2012   Procedure: COLONOSCOPY;  Surgeon: Malissa Hippo, MD;  Location: [facility];  Service: Endoscopy;  Laterality: N/A;  830-moved to 1125 Ann to notify pt   COLONOSCOPY WITH PROPOFOL N/A 08/31/2022   Procedure: COLONOSCOPY WITH PROPOFOL;  Surgeon: Dolores Frame, MD;  Location: [facility];  Service: Gastroenterology;  Laterality: N/A;  12:30 pm, asa 2, pt knows to arrive at 8:45   DILATION AND CURETTAGE OF UTERUS     X2   ESOPHAGOGASTRODUODENOSCOPY (EGD) WITH PROPOFOL N/A 08/31/2022   Procedure: ESOPHAGOGASTRODUODENOSCOPY (EGD) WITH PROPOFOL;  Surgeon: Dolores Frame, MD;  Location: [facility];  Service: Gastroenterology;  Laterality: N/A;   ESOPHAGOGASTRODUODENOSCOPY (EGD) WITH PROPOFOL N/A 11/03/2022    Procedure: ESOPHAGOGASTRODUODENOSCOPY (EGD) WITH PROPOFOL;  Surgeon: Dolores Frame, MD;  Location: [facility];  Service: Gastroenterology;  Laterality: N/A;  12:00pm;asa 1   HOT HEMOSTASIS  11/03/2022   Procedure: HOT HEMOSTASIS (ARGON PLASMA COAGULATION/BICAP);  Surgeon: Marguerita Merles, Reuel Boom, MD;  Location: [facility];  Service: Gastroenterology;;   JOINT REPLACEMENT Right    right hip   Right Carpal Tunnel Release     ROBOTIC ASSISTED LAPAROSCOPIC CHOLECYSTECTOMY-MULTI SITE N/A 06/27/2022   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   Procedure: LEFT TOTAL KNEE ARTHROPLASTY;  Surgeon: Loanne Drilling, MD;  Location: [facility];  Service: Orthopedics;  Laterality: Left;   TOTAL SHOULDER ARTHROPLASTY Right 06/17/2016   TOTAL SHOULDER ARTHROPLASTY Right 06/17/2016   Procedure: RIGHT TOTAL SHOULDER ARTHROPLASTY;  Surgeon: Beverely Low, MD;  Location: [facility];  Service: Orthopedics;  Laterality: Right;   TUBAL LIGATION      Family History  Problem Relation Age of Onset   Dementia Mother    Hypertension Mother    Lupus Mother    Fibromyalgia Sister    Depression Sister    Bipolar disorder Daughter    Drug abuse Daughter    Breast cancer Maternal Aunt    Breast cancer Cousin    Breast cancer Cousin    Colon cancer Neg Hx    Social History:  reports that she has never smoked. She has never used smokeless tobacco. She reports that she does not drink alcohol and does not use drugs.  Allergies:  Allergies  Allergen Reactions   Sulfa Antibiotics Rash   Seroquel [Quetiapine] Other (See Comments)    Seroquel anger, hostility, increased anxiety   Sulfonamide Derivatives Rash    SULFONAMIDES INCREASE SENSITIVITY   TO SUNLIGHT > MORE LIKELY TO SUNBURN SUNBURN-LIKE REDNESS    Medications Prior to Admission  Medication Sig Dispense Refill   acetaminophen (TYLENOL) 650 MG CR tablet Take 1,300 mg by mouth every 8 (eight) hours as needed for pain.     amLODipine (NORVASC) 5 MG  tablet Take 1 tablet (5 mg total) by mouth daily. 90 tablet 2   aspirin EC 81 MG tablet Take 1 tablet (81 mg total) by mouth daily. 30 tablet 11   clorazepate (TRANXENE) 3.75 MG tablet Take 1 tablet (3.75 mg total) by mouth at bedtime as needed for anxiety. 30 tablet 5   DULoxetine (CYMBALTA) 60 MG capsule Take 1 capsule (60 mg total) by mouth 2 (two) times daily. 180 capsule 0   estradiol (ESTRACE) 0.1 MG/GM vaginal cream Place 1 Applicatorful vaginally every 3 (three) days. 42.5 g 11   fexofenadine (ALLEGRA) 180 MG tablet TAKE 1 TABLET (180 MG TOTAL) BY MOUTH DAILY. FOR ALLERGY SYMPTOMS 90 tablet 0   levothyroxine (SYNTHROID) 125 MCG tablet Take 1 tablet (125 mcg total) by mouth daily. 90 tablet 3   omeprazole (PRILOSEC) 40 MG capsule Take 1 capsule (40 mg total) by mouth in the morning and at bedtime. 180 capsule 3   rosuvastatin (CRESTOR) 40 MG tablet Take 1 tablet (40 mg total) by mouth daily. 90 tablet 3   triamterene-hydrochlorothiazide (MAXZIDE-25) 37.5-25 MG tablet Take 1 tablet by mouth daily. As needed for swelling 90 tablet 2   albuterol (VENTOLIN HFA) 108 (90 Base) MCG/ACT inhaler INHALE 2 PUFFS BY MOUTH EVERY 6 HOURS AS NEEDED FOR WHEEZE OR SHORTNESS OF BREATH 8.5 each 2   ferrous sulfate (FE TABS) 325 (65 FE) MG EC tablet Take 1 tablet (325 mg total) by mouth daily with breakfast. (Patient taking differently: Take 325 mg by mouth every Monday, Wednesday, and Friday.) 90 tablet 3   fluticasone (FLONASE) 50 MCG/ACT nasal spray Place 2 sprays into both nostrils daily. 16 g 6   polyethylene glycol powder (GLYCOLAX/MIRALAX) 17 GM/SCOOP powder Take 17 g by mouth 2 (two) times daily as needed for moderate constipation. For constipation 3350 g 5    No results found for this or any previous visit (from the past 48 hours). No results found.  Review of Systems  All other systems reviewed and are negative.   Blood pressure 124/88, pulse 60, temperature 97.9 F (36.6 C), resp. rate 14,  height 5\' 4"  (1.626 m), weight 81.3 kg, SpO2 100%. Physical Exam  GENERAL: The patient is AO x3, in no acute distress. HEENT: Head is normocephalic and atraumatic. EOMI are intact. Mouth is well hydrated and without lesions. NECK: Supple. No masses LUNGS: Clear to auscultation. No presence of rhonchi/wheezing/rales. Adequate chest expansion HEART: RRR, normal s1 and s2. ABDOMEN: Soft, nontender, no guarding, no peritoneal signs, and nondistended. BS +. No masses. EXTREMITIES: Without any cyanosis, clubbing, rash, lesions or edema. NEUROLOGIC: AOx3, no focal motor deficit. 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  Will proceed with EGD.  Dolores Frame, MD 03/02/2023, 10:55 AM

## 2023-03-02 NOTE — Anesthesia Postprocedure Evaluation (Signed)
Anesthesia Post Note  Patient: Leslie Robbins  Procedure(s) Performed: ESOPHAGOGASTRODUODENOSCOPY (EGD) WITH PROPOFOL ESOPHAGEAL BANDING BIOPSY  Patient location during evaluation: PACU Anesthesia Type: General Level of consciousness: awake and alert Pain management: pain level controlled Vital Signs Assessment: post-procedure vital signs reviewed and stable Respiratory status: spontaneous breathing, nonlabored ventilation, respiratory function stable and patient connected to nasal cannula oxygen Cardiovascular status: blood pressure returned to baseline and stable Postop Assessment: no apparent nausea or vomiting Anesthetic complications: no   There were no known notable events for this encounter.   Last Vitals:  Vitals:   03/02/23 1132 03/02/23 1138  BP: 105/66 112/70  Pulse: 62 60  Resp: 14 13  Temp: 36.6 C   SpO2: 98% 98%    Last Pain:  Vitals:   03/02/23 1138  TempSrc:   PainSc: 0-No pain                 Devani Odonnel L Tocara Mennen

## 2023-03-02 NOTE — Anesthesia Preprocedure Evaluation (Addendum)
Anesthesia Evaluation  Patient identified by MRN, date of birth, ID band Patient awake    Reviewed: Allergy & Precautions, H&P , NPO status , Patient's Chart, lab work & pertinent test results, reviewed documented beta blocker date and time   Airway Mallampati: II  TM Distance: >3 FB Neck ROM: full    Dental  (+) Dental Advisory Given, Missing   Pulmonary neg pulmonary ROS   Pulmonary exam normal breath sounds clear to auscultation       Cardiovascular Exercise Tolerance: Good hypertension, Normal cardiovascular exam Rhythm:regular Rate:Normal     Neuro/Psych  PSYCHIATRIC DISORDERS Anxiety Depression    negative neurological ROS  negative psych ROS   GI/Hepatic negative GI ROS, Neg liver ROS, hiatal hernia,GERD  ,,  Endo/Other  negative endocrine ROSHypothyroidism    Renal/GU negative Renal ROS  negative genitourinary   Musculoskeletal   Abdominal   Peds  Hematology negative hematology ROS (+) Blood dyscrasia, anemia   Anesthesia Other Findings   Reproductive/Obstetrics negative OB ROS                             Anesthesia Physical Anesthesia Plan  ASA: 2  Anesthesia Plan: General   Post-op Pain Management: Minimal or no pain anticipated   Induction: Intravenous  PONV Risk Score and Plan: Propofol infusion  Airway Management Planned: Nasal Cannula and Natural Airway  Additional Equipment: None  Intra-op Plan:   Post-operative Plan:   Informed Consent: I have reviewed the patients History and Physical, chart, labs and discussed the procedure including the risks, benefits and alternatives for the proposed anesthesia with the patient or authorized representative who has indicated his/her understanding and acceptance.     Dental Advisory Given  Plan Discussed with: CRNA  Anesthesia Plan Comments:        Anesthesia Quick Evaluation

## 2023-03-02 NOTE — Op Note (Signed)
[facility] Patient Name: Leslie Robbins Procedure Date: 03/02/2023 11:09 AM MRN: [id number] Date of Birth: Mar 29, 1955 Attending MD: Katrinka Blazing , , [id number] CSN: [id number] Age: 67 Admit Type: Outpatient Procedure:                Upper GI endoscopy Indications:              Watermelon stomach (GAVE syndrome) Providers:                Katrinka Blazing, Crystal Page, Zena Amos Referring MD:              Medicines:                Monitored Anesthesia Care Complications:            No immediate complications. Estimated Blood Loss:     Estimated blood loss: none. Procedure:                Pre-Anesthesia Assessment:                           - Prior to the procedure, a History and Physical                            was performed, and patient medications, allergies                            and sensitivities were reviewed. The patient's                            tolerance of previous anesthesia was reviewed.                           - The risks and benefits of the procedure and the                            sedation options and risks were discussed with the                            patient. All questions were answered and informed                            consent was obtained.                           - ASA Grade Assessment: II - A patient with mild                            systemic disease.                           After obtaining informed consent, the endoscope was                            passed under direct vision. Throughout the                            procedure, the patient's blood  pressure, pulse, and                            oxygen saturations were monitored continuously. The                            [id number]-[id number] ([id number]) scope was introduced through the                            mouth, and advanced to the second part of duodenum.                            The upper GI endoscopy was accomplished without                            difficulty. The  patient tolerated the procedure                            well. Scope In: 11:18:23 AM Scope Out: 11:30:12 AM Total Procedure Duration: 0 hours 11 minutes 49 seconds  Findings:      A 2 cm hiatal hernia was present.      White nummular lesions were noted in the middle third of the esophagus.       Biopsies were taken with a cold forceps for histology.      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 Three bands were successfully placed. There was no       bleeding during the procedure.      The examined duodenum was normal. Impression:               - 2 cm hiatal hernia.                           - White nummular lesions in esophageal mucosa.                            Biopsied.                           - Gastric antral vascular ectasia without bleeding.                            Banded.                           - Normal examined duodenum. Moderate Sedation:      Per Anesthesia Care Recommendation:           - Discharge patient to home (ambulatory).                           - Resume previous diet.                           - Await pathology results.                           -  Use Prilosec (omeprazole) 40 mg PO BID.                           - Repeat upper endoscopy in 3 months for                            surveillance. Procedure Code(s):        --- Professional ---                           [phone], Esophagogastroduodenoscopy, flexible,                            transoral; with band ligation of esophageal/gastric                            varices                           [id number], Esophagogastroduodenoscopy, flexible,                            transoral; with biopsy, single or multiple Diagnosis Code(s):        --- Professional ---                           K44.9, Diaphragmatic hernia without obstruction or                            gangrene                           K22.89, Other specified disease of esophagus                            K31.819, Angiodysplasia of stomach and duodenum                            without bleeding CPT copyright 2022 [employer]. All rights reserved. The codes documented in this report are preliminary and upon coder review may  be revised to meet current compliance requirements. Katrinka Blazing, MD Katrinka Blazing,  03/02/2023 11:37:12 AM This report has been signed electronically. Number of Addenda: 0

## 2023-03-02 NOTE — Anesthesia Procedure Notes (Signed)
Date/Time: 03/02/2023 11:14 AM  Performed by: Julian Reil, CRNAPre-anesthesia Checklist: Patient identified, Emergency Drugs available, Suction available and Patient being monitored Patient Re-evaluated:Patient Re-evaluated prior to induction Oxygen Delivery Method: Nasal cannula Induction Type: IV induction Placement Confirmation: positive ETCO2

## 2023-03-02 NOTE — Transfer of Care (Signed)
Immediate Anesthesia Transfer of Care Note  Patient: Leslie Robbins  Procedure(s) Performed: ESOPHAGOGASTRODUODENOSCOPY (EGD) WITH PROPOFOL ESOPHAGEAL BANDING BIOPSY  Patient Location: Short Stay  Anesthesia Type:General  Level of Consciousness: drowsy  Airway & Oxygen Therapy: Patient Spontanous Breathing  Post-op Assessment: Report given to RN and Post -op Vital signs reviewed and stable  Post vital signs: Reviewed and stable  Last Vitals:  Vitals Value Taken Time  BP    Temp    Pulse    Resp    SpO2      Last Pain:  Vitals:   03/02/23 1114  PainSc: 0-No pain         Complications: No notable events documented.

## 2023-03-02 NOTE — Discharge Instructions (Signed)
You are being discharged to home.  Resume your previous diet.  We are waiting for your pathology results.  Take Prilosec (omeprazole) 40 mg by mouth twice a day.  Your physician has recommended a repeat upper endoscopy in three months for surveillance.

## 2023-03-03 ENCOUNTER — Other Ambulatory Visit (INDEPENDENT_AMBULATORY_CARE_PROVIDER_SITE_OTHER): Payer: Self-pay | Admitting: Gastroenterology

## 2023-03-03 DIAGNOSIS — B3781 Candidal esophagitis: Secondary | ICD-10-CM

## 2023-03-03 LAB — SURGICAL PATHOLOGY

## 2023-03-03 MED ORDER — FLUCONAZOLE 100 MG PO TABS: 100.0000 mg | ORAL_TABLET | Freq: Every day | ORAL | 0 refills | Status: DC

## 2023-03-19 ENCOUNTER — Other Ambulatory Visit: Payer: Self-pay | Admitting: Family Medicine

## 2023-03-19 DIAGNOSIS — F418 Other specified anxiety disorders: Secondary | ICD-10-CM

## 2023-04-05 ENCOUNTER — Encounter (HOSPITAL_COMMUNITY): Payer: Self-pay | Admitting: Gastroenterology

## 2023-04-24 ENCOUNTER — Encounter (INDEPENDENT_AMBULATORY_CARE_PROVIDER_SITE_OTHER): Payer: Self-pay | Admitting: *Deleted

## 2023-05-10 ENCOUNTER — Ambulatory Visit: Payer: Self-pay | Admitting: Family Medicine

## 2023-05-10 NOTE — Telephone Encounter (Signed)
 Copied from CRM [id number]. Topic: Clinical - Red Word Triage >> May 10, 2023  2:11 PM Clayton Bibles wrote: Red Word that prompted transfer to Nurse Triage: She is itching in her rectum and she also has a rash. Sometimes she has discharges from her rectum. No fever Reason for Disposition  MODERATE-SEVERE rectal itching (i.e., interferes with school, work, or sleep)  Answer Assessment - Initial Assessment Questions 1. SYMPTOM:  "What's the main symptom you're concerned about?" (e.g., pain, itching, swelling, rash)     Itching to the rectum 2. ONSET: "When did the itching start?"     Several months, but now tired of it, been using vaseline 3. RECTAL PAIN: "Do you have any pain around your rectum?" "How bad is the pain?"  (Scale 0-10; or mild, moderate, severe)   - NONE (0): no pain   - MILD (1-3): doesn't interfere with normal activities    - MODERATE (4-7): interferes with normal activities or awakens from sleep, limping    - SEVERE (8-10): excruciating pain, unable to have a bowel movement      No 4. RECTAL ITCHING: "Do you have any itching in this area?" "How bad is the itching?"  (Scale 0-10; or mild, moderate, severe)   - NONE: no itching   - MILD: doesn't interfere with normal activities    - MODERATE-SEVERE: interferes with normal activities or awakens from sleep     5 mostly when have a BM 5. CONSTIPATION: "Do you have constipation?" If Yes, ask: "How bad is it?"     Sometimes, taking miralax 2-3 times a week 6. CAUSE: "What do you think is causing the anus symptoms?"     No idea 7. OTHER SYMPTOMS: "Do you have any other symptoms?"  (e.g., abdomen pain, fever, rectal bleeding, vomiting)     Feels like a rash, urinary symptoms (burning, little pressure every now and then)  Protocols used: Rectal Symptoms-A-AH

## 2023-05-10 NOTE — Telephone Encounter (Signed)
 Chief Complaint: Rectal itching Symptoms: itching 5/10, feels like a rash is at the rectum, Urine symptoms (sometimes pressure, burning) Frequency: Onset several months of itching Pertinent Negatives: Patient denies other symptoms Disposition: [] ED /[] Urgent Care (no appt availability in office) / [x] Appointment(In office/virtual)/ []  [facility]/ [] Home Care/ [] Refused Recommended Disposition /[] [facility]/ []  Follow-up with PCP Additional Notes: Patient says the rectal itching has been ongoing for several months and now she's tired of it and wants to see someone. She says she's using vaseline and it feels like there is a rash. She says she wants to get tested for a urinary infection because she has burning and sometimes pressure, especially if she's not drinking a lot. Scheduled with PCP tomorrow.

## 2023-05-11 ENCOUNTER — Ambulatory Visit: Admitting: Family Medicine

## 2023-05-23 ENCOUNTER — Ambulatory Visit (INDEPENDENT_AMBULATORY_CARE_PROVIDER_SITE_OTHER): Payer: Medicare HMO | Admitting: Gastroenterology

## 2023-06-23 ENCOUNTER — Ambulatory Visit: Payer: Self-pay

## 2023-06-23 DIAGNOSIS — R300 Dysuria: Secondary | ICD-10-CM | POA: Diagnosis not present

## 2023-06-23 DIAGNOSIS — K5792 Diverticulitis of intestine, part unspecified, without perforation or abscess without bleeding: Secondary | ICD-10-CM | POA: Diagnosis not present

## 2023-06-23 DIAGNOSIS — B379 Candidiasis, unspecified: Secondary | ICD-10-CM | POA: Diagnosis not present

## 2023-06-23 NOTE — Telephone Encounter (Signed)
  Chief Complaint: burning around bladder area Symptoms: intermittent bladder pain Frequency: yesterday Pertinent Negatives: Patient denies burning WITH urination, flank pain or fever Disposition: [] ED /[x] Urgent Care (no appt availability in office) / [] Appointment(In office/virtual)/ []  [facility]/ [] Home Care/ [] Refused Recommended Disposition /[] [facility]/ []  Follow-up with PCP Additional Notes: office closed  Copied from CRM [id number]. Topic: Clinical - Red Word Triage >> Jun 23, 2023  9:37 AM Carlatta H wrote: [facility] that prompted transfer to Nurse Triage: Patient thinks she may have a bladder infection and is having a lot of pain in growing area//  Pt hung up prior to transfer.  Reason for Disposition  Age > 60 years  Answer Assessment - Initial Assessment Questions 1. LOCATION: "Where does it hurt?"      Bladder area 2. RADIATION: "Does the pain shoot anywhere else?" (e.g., chest, back)     Stomach x 1  3. ONSET: "When did the pain begin?" (e.g., minutes, hours or days ago)      Yesterday  4. SUDDEN: "Gradual or sudden onset?"     suden 5. PATTERN "Does the pain come and go, or is it constant?"    - If it comes and goes: "How long does it last?" "Do you have pain now?"     (Note: Comes and goes means the pain is intermittent. It goes away completely between bouts.)    - If constant: "Is it getting better, staying the same, or getting worse?"      (Note: Constant means the pain never goes away completely; most serious pain is constant and gets worse.)      Comes and goes  6. SEVERITY: "How bad is the pain?"  (e.g., Scale 1-10; mild, moderate, or severe)    - MILD (1-3): Doesn't interfere with normal activities, abdomen soft and not tender to touch.     - MODERATE (4-7): Interferes with normal activities or awakens from sleep, abdomen tender to touch.     - SEVERE (8-10): Excruciating pain, doubled over, unable to do any normal activities.        Intermittent  8. CAUSE: "What do you think is causing the stomach pain?"     Bladder infection   10. OTHER SYMPTOMS: "Do you have any other symptoms?" (e.g., back pain, diarrhea, fever, urination pain, vomiting)       Burning sensation but not with urination  Protocols used: Abdominal Pain - Female-A-AH

## 2023-06-26 NOTE — Telephone Encounter (Signed)
 Seen by urgent care. LS

## 2023-07-05 DIAGNOSIS — H52223 Regular astigmatism, bilateral: Secondary | ICD-10-CM | POA: Diagnosis not present

## 2023-07-05 DIAGNOSIS — H2513 Age-related nuclear cataract, bilateral: Secondary | ICD-10-CM | POA: Diagnosis not present

## 2023-07-05 DIAGNOSIS — H524 Presbyopia: Secondary | ICD-10-CM | POA: Diagnosis not present

## 2023-07-05 DIAGNOSIS — H5203 Hypermetropia, bilateral: Secondary | ICD-10-CM | POA: Diagnosis not present

## 2023-07-06 ENCOUNTER — Other Ambulatory Visit: Payer: Self-pay | Admitting: Family Medicine

## 2023-07-06 DIAGNOSIS — I10 Essential (primary) hypertension: Secondary | ICD-10-CM

## 2023-07-06 DIAGNOSIS — F418 Other specified anxiety disorders: Secondary | ICD-10-CM

## 2023-08-07 ENCOUNTER — Ambulatory Visit (INDEPENDENT_AMBULATORY_CARE_PROVIDER_SITE_OTHER): Payer: Medicare HMO | Admitting: Family Medicine

## 2023-08-07 ENCOUNTER — Encounter: Payer: Self-pay | Admitting: Family Medicine

## 2023-08-07 VITALS — BP 98/66 | HR 68 | Temp 97.9°F | Ht 64.0 in | Wt 183.0 lb

## 2023-08-07 DIAGNOSIS — Z79899 Other long term (current) drug therapy: Secondary | ICD-10-CM

## 2023-08-07 DIAGNOSIS — F418 Other specified anxiety disorders: Secondary | ICD-10-CM

## 2023-08-07 DIAGNOSIS — J3489 Other specified disorders of nose and nasal sinuses: Secondary | ICD-10-CM

## 2023-08-07 DIAGNOSIS — E785 Hyperlipidemia, unspecified: Secondary | ICD-10-CM | POA: Diagnosis not present

## 2023-08-07 DIAGNOSIS — E039 Hypothyroidism, unspecified: Secondary | ICD-10-CM | POA: Diagnosis not present

## 2023-08-07 DIAGNOSIS — I10 Essential (primary) hypertension: Secondary | ICD-10-CM | POA: Diagnosis not present

## 2023-08-07 DIAGNOSIS — D509 Iron deficiency anemia, unspecified: Secondary | ICD-10-CM

## 2023-08-07 MED ORDER — FEXOFENADINE HCL 180 MG PO TABS: 180.0000 mg | ORAL_TABLET | Freq: Every day | ORAL | 0 refills | Status: DC

## 2023-08-07 MED ORDER — LEVOTHYROXINE SODIUM 125 MCG PO TABS
125.0000 ug | ORAL_TABLET | Freq: Every day | ORAL | 3 refills | Status: AC
Start: 2023-08-07 — End: ?

## 2023-08-07 MED ORDER — CLORAZEPATE DIPOTASSIUM 3.75 MG PO TABS: 3.7500 mg | ORAL_TABLET | Freq: Every evening | ORAL | 5 refills | Status: DC | PRN

## 2023-08-07 MED ORDER — DULOXETINE HCL 60 MG PO CPEP: 60.0000 mg | ORAL_CAPSULE | Freq: Two times a day (BID) | ORAL | 0 refills | Status: DC

## 2023-08-07 MED ORDER — ESTRADIOL 0.1 MG/GM VA CREA: 1.0000 | TOPICAL_CREAM | VAGINAL | 11 refills | Status: AC

## 2023-08-07 MED ORDER — BUTENAFINE HCL 1 % EX CREA: TOPICAL_CREAM | CUTANEOUS | 5 refills | Status: AC

## 2023-08-07 MED ORDER — TRIAMTERENE-HCTZ 37.5-25 MG PO TABS: 0.5000 | ORAL_TABLET | Freq: Every day | ORAL | 3 refills | Status: DC

## 2023-08-07 MED ORDER — ALBUTEROL SULFATE HFA 108 (90 BASE) MCG/ACT IN AERS: 2.0000 | INHALATION_SPRAY | RESPIRATORY_TRACT | 2 refills | Status: AC | PRN

## 2023-08-07 NOTE — Progress Notes (Signed)
 Subjective:  Patient ID: Leslie Robbins, female    DOB: Jun 11, 1955  Age: 68 y.o. MRN: [id number]  CC: Medical Management of Chronic Issues (Pain all over since taken off of arthritis medicine. ), itching (Itching btween vaginal and rectal area. Ongoing for 5 to 6 months. Otc cream helps but itching does not go away. ), and sore (Sore on nose for about 6 months. Scab that wont heal. Bleeds when messed with. No drainage.  )   HPI Leslie Robbins presents for  follow-up of hypertension. Patient has no history of headache chest pain or shortness of breath or recent cough. Patient also denies symptoms of TIA such as focal numbness or weakness. Patient denies side effects from medication. States taking it regularly.   follow-up on  thyroid . The patient has a history of hypothyroidism for many years. It has been stable recently. Pt. denies any change in  voice, loss of hair, heat or cold intolerance. Energy level has been adequate to good. Patient denies constipation and diarrhea. No myxedema. Medication is as noted below. Verified that pt is taking it daily on an empty stomach. Well tolerated.  Using CERvo  (like vaseline) Itches after BM.    History Leslie Robbins has a past medical history of Anxiety, Arthritis, Depression, GERD (gastroesophageal reflux disease), History of hiatal hernia, Hypercholesteremia, Hypertension, Hypothyroidism, and Pain management contract signed (02/06/2017).   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   Her family history includes Bipolar disorder in her daughter; Breast cancer in her cousin, cousin, and maternal aunt; Dementia in her mother; Depression in her sister; Drug abuse in her daughter; Fibromyalgia in her sister; Hypertension in her mother; Lupus in her mother.She reports that she has never smoked. She has never used smokeless tobacco. She reports that she does not drink alcohol and does not use drugs.  Current Outpatient Medications on File Prior to Visit  Medication Sig Dispense Refill   acetaminophen  (TYLENOL ) 650 MG CR tablet Take 1,300 mg by mouth every 8 (eight) hours as needed for pain.     amLODipine  (NORVASC ) 5 MG tablet TAKE 1 TABLET (5 MG TOTAL) BY MOUTH DAILY. 90 tablet 0   aspirin  EC 81 MG tablet Take 1 tablet (81 mg total) by mouth daily. 30 tablet 11   ferrous sulfate  (FE TABS) 325 (65 FE) MG EC tablet Take 1 tablet (325 mg total) by mouth daily with breakfast. (Patient taking differently: Take 325 mg by mouth every Monday, Wednesday, and Friday.) 90 tablet 3   omeprazole  (PRILOSEC) 40 MG capsule Take 1 capsule (40 mg total) by mouth in the morning and at bedtime. 180 capsule 1   polyethylene glycol powder (GLYCOLAX /MIRALAX ) 17 GM/SCOOP powder Take 17 g by mouth 2 (two) times daily as needed for moderate constipation. For constipation 3350 g 5   rosuvastatin  (CRESTOR ) 40 MG tablet Take 1 tablet (40 mg total) by mouth daily. 90 tablet 3   fluconazole  (DIFLUCAN ) 100 MG tablet Take 1 tablet (100 mg total) by mouth daily. Take  2 tablets on day one, then one tablet daily (Patient not taking: Reported on 08/07/2023) 15 tablet 0   fluticasone  (FLONASE ) 50 MCG/ACT nasal spray Place 2  sprays into both nostrils daily. (Patient not taking: Reported on 08/07/2023) 16 g 6   No current facility-administered medications on file prior to visit.    ROS Review of Systems  Constitutional: Negative.   HENT: Negative.    Eyes:  Negative for visual disturbance.  Respiratory:  Negative for shortness of breath.   Cardiovascular:  Negative for chest pain.  Gastrointestinal:  Negative for abdominal pain.  Musculoskeletal:  Negative for arthralgias.  Skin:        Itching at perineum    Objective:  BP 98/66   Pulse 68   Temp 97.9 F (36.6 C)   Ht 5\' 4"  (1.626 m)   Wt 183 lb (83 kg)   SpO2 96%   BMI 31.41 kg/m   BP Readings from Last 3 Encounters:  08/07/23 98/66  03/02/23 112/70  02/06/23 131/88    Wt Readings from Last 3 Encounters:  08/07/23 183 lb (83 kg)  03/02/23 179 lb 3.7 oz (81.3 kg)  02/24/23 179 lb 3.2 oz (81.3 kg)     Physical Exam    Assessment & Plan:  Hyperlipidemia with target LDL less than 100 -     CMP14+EGFR -     Lipid panel  Essential hypertension -     CBC with Differential/Platelet -     CMP14+EGFR -     Triamterene -HCTZ; Take 0.5 tablets by mouth daily. As needed for swelling  Dispense: 45 tablet; Refill: 3  Hypothyroidism, unspecified type -     TSH + free T4 -     Levothyroxine  Sodium; Take 1 tablet (125 mcg total) by mouth daily.  Dispense: 90 tablet; Refill: 3  Iron deficiency anemia, unspecified iron deficiency anemia type -     CBC with Differential/Platelet  Depression with anxiety -     Clorazepate  Dipotassium; Take 1 tablet (3.75 mg total) by mouth at bedtime as needed for anxiety.  Dispense: 30 tablet; Refill: 5 -     DULoxetine  HCl; Take 1 capsule (60 mg total) by mouth 2 (two) times daily.  Dispense: 180 capsule; Refill: 0  Controlled substance agreement signed -     ToxASSURE Select 13 (MW), Urine  Lesion of nasal mucosa -     Ambulatory referral to ENT  Other orders -     Fexofenadine  HCl; Take 1 tablet (180  mg total) by mouth daily. For allergy symptoms  Dispense: 90 tablet; Refill: 0 -     Estradiol ; Place 1 Applicatorful vaginally every 3 (three) days.  Dispense: 42.5 g; Refill: 11 -     Albuterol  Sulfate HFA; Inhale 2 puffs into the lungs every 4 (four) hours as needed for wheezing or shortness of breath.  Dispense: 8.5 each; Refill: 2 -     Butenafine HCl; Apply twice daily to affected areas  Dispense: 30 g; Refill: 5    Allergies as of 08/07/2023       Reactions   Sulfa Antibiotics Rash   Seroquel  [quetiapine ] Other (See Comments)   Seroquel  anger, hostility, increased anxiety   Sulfonamide Derivatives Rash   SULFONAMIDES INCREASE SENSITIVITY   TO SUNLIGHT > MORE LIKELY TO SUNBURN SUNBURN-LIKE REDNESS        Medication List        Accurate as of August 07, 2023 10:31 AM. If you have any questions, ask  your nurse or doctor.          acetaminophen  650 MG CR tablet Commonly known as: TYLENOL  Take 1,300 mg by mouth every 8 (eight) hours as needed for pain.   albuterol  108 (90 Base) MCG/ACT inhaler Commonly known as: VENTOLIN  HFA Inhale 2 puffs into the lungs every 4 (four) hours as needed for wheezing or shortness of breath. What changed: See the new instructions. Changed by: Illyanna Petillo   amLODipine  5 MG tablet Commonly known as: NORVASC  TAKE 1 TABLET (5 MG TOTAL) BY MOUTH DAILY.   aspirin  EC 81 MG tablet Take 1 tablet (81 mg total) by mouth daily.   Butenafine HCl 1 % cream Apply twice daily to affected areas Started by: Theresa Wedel   clorazepate  3.75 MG tablet Commonly known as: TRANXENE  Take 1 tablet (3.75 mg total) by mouth at bedtime as needed for anxiety.   DULoxetine  60 MG capsule Commonly known as: CYMBALTA  Take 1 capsule (60 mg total) by mouth 2 (two) times daily.   estradiol  0.1 MG/GM vaginal cream Commonly known as: ESTRACE  Place 1 Applicatorful vaginally every 3 (three) days.   ferrous sulfate  325 (65 FE) MG EC tablet Commonly known as: Fe  Tabs Take 1 tablet (325 mg total) by mouth daily with breakfast. What changed: when to take this   fexofenadine  180 MG tablet Commonly known as: ALLEGRA  Take 1 tablet (180 mg total) by mouth daily. For allergy symptoms   fluconazole  100 MG tablet Commonly known as: DIFLUCAN  Take 1 tablet (100 mg total) by mouth daily. Take 2 tablets on day one, then one tablet daily   fluticasone  50 MCG/ACT nasal spray Commonly known as: FLONASE  Place 2 sprays into both nostrils daily.   levothyroxine  125 MCG tablet Commonly known as: SYNTHROID  Take 1 tablet (125 mcg total) by mouth daily.   omeprazole  40 MG capsule Commonly known as: PRILOSEC Take 1 capsule (40 mg total) by mouth in the morning and at bedtime.   polyethylene glycol powder 17 GM/SCOOP powder Commonly known as: GLYCOLAX /MIRALAX  Take 17 g by mouth 2 (two) times daily as needed for moderate constipation. For constipation   rosuvastatin  40 MG tablet Commonly known as: CRESTOR  Take 1 tablet (40 mg total) by mouth daily.   triamterene -hydrochlorothiazide  37.5-25 MG tablet Commonly known as: MAXZIDE-25 Take 0.5 tablets by mouth daily. As needed for swelling What changed: how much to take Changed by: Vallorie Gayer Dusan Lipford         Follow-up: Return in about 6 months (around 02/06/2024) for Anxiety, hypertension, Hypothyroidism.  Roise Cleaver, M.D.

## 2023-08-08 LAB — CBC WITH DIFFERENTIAL/PLATELET
Basophils Absolute: 0 10*3/uL (ref 0.0–0.2)
Basos: 1 %
EOS (ABSOLUTE): 0.3 10*3/uL (ref 0.0–0.4)
Eos: 4 %
Hematocrit: 44 % (ref 34.0–46.6)
Hemoglobin: 13.9 g/dL (ref 11.1–15.9)
Immature Grans (Abs): 0 10*3/uL (ref 0.0–0.1)
Immature Granulocytes: 0 %
Lymphocytes Absolute: 1.3 10*3/uL (ref 0.7–3.1)
Lymphs: 19 %
MCH: 27.9 pg (ref 26.6–33.0)
MCHC: 31.6 g/dL (ref 31.5–35.7)
MCV: 88 fL (ref 79–97)
Monocytes Absolute: 0.5 10*3/uL (ref 0.1–0.9)
Monocytes: 7 %
Neutrophils Absolute: 4.6 10*3/uL (ref 1.4–7.0)
Neutrophils: 69 %
Platelets: 273 10*3/uL (ref 150–450)
RBC: 4.99 x10E6/uL (ref 3.77–5.28)
RDW: 13.6 % (ref 11.7–15.4)
WBC: 6.7 10*3/uL (ref 3.4–10.8)

## 2023-08-08 LAB — CMP14+EGFR
ALT: 24 IU/L (ref 0–32)
AST: 29 IU/L (ref 0–40)
Albumin: 4.3 g/dL (ref 3.9–4.9)
Alkaline Phosphatase: 215 IU/L — ABNORMAL HIGH (ref 44–121)
BUN/Creatinine Ratio: 21 (ref 12–28)
BUN: 24 mg/dL (ref 8–27)
Bilirubin Total: 0.6 mg/dL (ref 0.0–1.2)
CO2: 23 mmol/L (ref 20–29)
Calcium: 10.7 mg/dL — ABNORMAL HIGH (ref 8.7–10.3)
Chloride: 101 mmol/L (ref 96–106)
Creatinine, Ser: 1.14 mg/dL — ABNORMAL HIGH (ref 0.57–1.00)
Globulin, Total: 2.8 g/dL (ref 1.5–4.5)
Glucose: 88 mg/dL (ref 70–99)
Potassium: 3.9 mmol/L (ref 3.5–5.2)
Sodium: 140 mmol/L (ref 134–144)
Total Protein: 7.1 g/dL (ref 6.0–8.5)
eGFR: 52 mL/min/{1.73_m2} — ABNORMAL LOW (ref >59)

## 2023-08-08 LAB — LIPID PANEL
Chol/HDL Ratio: 2.2 ratio (ref 0.0–4.4)
Cholesterol, Total: 114 mg/dL (ref 100–199)
HDL: 51 mg/dL (ref >39)
LDL Chol Calc (NIH): 46 mg/dL (ref 0–99)
Triglycerides: 88 mg/dL (ref 0–149)
VLDL Cholesterol Cal: 17 mg/dL (ref 5–40)

## 2023-08-08 LAB — TSH+FREE T4
Free T4: 1.75 ng/dL (ref 0.82–1.77)
TSH: 0.017 u[IU]/mL — ABNORMAL LOW (ref 0.450–4.500)

## 2023-08-09 ENCOUNTER — Ambulatory Visit: Payer: Self-pay | Admitting: Family Medicine

## 2023-08-09 LAB — TOXASSURE SELECT 13 (MW), URINE

## 2023-08-09 NOTE — Progress Notes (Signed)
Hello Lyrica,  Your lab result is normal and/or stable.Some minor variations that are not significant are commonly marked abnormal, but do not represent any medical problem for you.  Best regards, Claretta Fraise, M.D.

## 2023-09-07 ENCOUNTER — Ambulatory Visit

## 2023-09-07 VITALS — BP 98/66 | HR 68 | Ht 64.0 in | Wt 183.0 lb

## 2023-09-07 DIAGNOSIS — Z13820 Encounter for screening for osteoporosis: Secondary | ICD-10-CM

## 2023-09-07 DIAGNOSIS — Z0000 Encounter for general adult medical examination without abnormal findings: Secondary | ICD-10-CM

## 2023-09-07 NOTE — Patient Instructions (Signed)
 Leslie Robbins , Thank you for taking time out of your busy schedule to complete your Annual Wellness Visit with me. I enjoyed our conversation and look forward to speaking with you again next year. I, as well as your care team,  appreciate your ongoing commitment to your health goals. Please review the following plan we discussed and let me know if I can assist you in the future. Your Game plan/ To Do List     Follow up Visits: Next Medicare AWV with our clinical staff: 09/10/24 at 1:50p.m.    Next Office Visit with your provider: 02/06/24 at 9:55a.m.  Clinician Recommendations:  Aim for 30 minutes of exercise or brisk walking, 6-8 glasses of water , and 5 servings of fruits and vegetables each day.       This is a list of the screening recommended for you and due dates:  Health Maintenance  Topic Date Due   DEXA scan (bone density measurement)  10/01/2022   Medicare Annual Wellness Visit  06/21/2023   COVID-19 Vaccine (3 - Moderna risk series) 09/22/2024*   Flu Shot  10/06/2023   Mammogram  02/24/2024   Colon Cancer Screening  08/31/2027   DTaP/Tdap/Td vaccine (2 - Td or Tdap) 02/05/2033   Pneumococcal Vaccine for age over 58  Completed   Hepatitis B Vaccine  Completed   Hepatitis C Screening  Completed   Zoster (Shingles) Vaccine  Completed   HPV Vaccine  Aged Out   Meningitis B Vaccine  Aged Out  *Topic was postponed. The date shown is not the original due date.    Advanced directives: (Declined) Advance directive discussed with you today. Even though you declined this today, please call our office should you change your mind, and we can give you the proper paperwork for you to fill out. Advance Care Planning is important because it:  [x]  Makes sure you receive the medical care that is consistent with your values, goals, and preferences  [x]  It provides guidance to your family and loved ones and reduces their decisional burden about whether or not they are making the right decisions  based on your wishes.  Follow the link provided in your after visit summary or read over the paperwork we have mailed to you to help you started getting your Advance Directives in place. If you need assistance in completing these, please reach out to us  so that we can help you!  See attachments for Preventive Care and Fall Prevention Tips.

## 2023-09-07 NOTE — Progress Notes (Signed)
 Subjective:   Leslie Robbins is a 68 y.o. who presents for a Medicare Wellness preventive visit.  As a reminder, Annual Wellness Visits don't include a physical exam, and some assessments may be limited, especially if this visit is performed virtually. We may recommend an in-person follow-up visit with your provider if needed.  Visit Complete: Virtual I connected with  Leslie Robbins on 09/07/23 by a audio enabled telemedicine application and verified that I am speaking with the correct person using two identifiers.  Patient Location: Home  Provider Location: Home Office  I discussed the limitations of evaluation and management by telemedicine. The patient expressed understanding and agreed to proceed.  Vital Signs: Because this visit was a virtual/telehealth visit, some criteria may be missing or patient reported. Any vitals not documented were not able to be obtained and vitals that have been documented are patient reported.  VideoDeclined- This patient declined Librarian, academic. Therefore the visit was completed with audio only.  Persons Participating in Visit: Patient.  AWV Questionnaire: Yes: Patient Medicare AWV questionnaire was completed by the patient on 08/31/23; I have confirmed that all information answered by patient is correct and no changes since this date.        Objective:    Today's Vitals   08/31/23 1239 09/07/23 1533  BP:  98/66  Pulse:  68  Weight:  183 lb (83 kg)  Height:  5' 4 (1.626 m)  PainSc: 4     Body mass index is 31.41 kg/m.     09/07/2023    3:39 PM 02/24/2023    1:33 PM 11/03/2022   10:22 AM 08/31/2022    9:24 AM 06/23/2022   11:00 AM 06/21/2022    1:57 PM 06/17/2021    8:21 AM  Advanced Directives  Does Patient Have a Medical Advance Directive? No No No No No No No  Would patient like information on creating a medical advance directive?  No - Patient declined No - Patient declined No - Patient declined No -  Patient declined No - Patient declined No - Patient declined    Current Medications (verified) Outpatient Encounter Medications as of 09/07/2023  Medication Sig   acetaminophen  (TYLENOL ) 650 MG CR tablet Take 1,300 mg by mouth every 8 (eight) hours as needed for pain.   albuterol  (VENTOLIN  HFA) 108 (90 Base) MCG/ACT inhaler Inhale 2 puffs into the lungs every 4 (four) hours as needed for wheezing or shortness of breath.   amLODipine  (NORVASC ) 5 MG tablet TAKE 1 TABLET (5 MG TOTAL) BY MOUTH DAILY.   aspirin  EC 81 MG tablet Take 1 tablet (81 mg total) by mouth daily.   Butenafine  HCl 1 % cream Apply twice daily to affected areas   clorazepate  (TRANXENE ) 3.75 MG tablet Take 1 tablet (3.75 mg total) by mouth at bedtime as needed for anxiety.   DULoxetine  (CYMBALTA ) 60 MG capsule Take 1 capsule (60 mg total) by mouth 2 (two) times daily.   estradiol  (ESTRACE ) 0.1 MG/GM vaginal cream Place 1 Applicatorful vaginally every 3 (three) days.   ferrous sulfate  (FE TABS) 325 (65 FE) MG EC tablet Take 1 tablet (325 mg total) by mouth daily with breakfast. (Patient taking differently: Take 325 mg by mouth every Monday, Wednesday, and Friday.)   fexofenadine  (ALLEGRA ) 180 MG tablet Take 1 tablet (180 mg total) by mouth daily. For allergy symptoms   levothyroxine  (SYNTHROID ) 125 MCG tablet Take 1 tablet (125 mcg total) by mouth daily.  omeprazole  (PRILOSEC) 40 MG capsule Take 1 capsule (40 mg total) by mouth in the morning and at bedtime.   polyethylene glycol powder (GLYCOLAX /MIRALAX ) 17 GM/SCOOP powder Take 17 g by mouth 2 (two) times daily as needed for moderate constipation. For constipation   rosuvastatin  (CRESTOR ) 40 MG tablet Take 1 tablet (40 mg total) by mouth daily.   triamterene -hydrochlorothiazide  (MAXZIDE-25) 37.5-25 MG tablet Take 0.5 tablets by mouth daily. As needed for swelling   fluconazole  (DIFLUCAN ) 100 MG tablet Take 1 tablet (100 mg total) by mouth daily. Take 2 tablets on day one, then one  tablet daily (Patient not taking: Reported on 09/07/2023)   fluticasone  (FLONASE ) 50 MCG/ACT nasal spray Place 2 sprays into both nostrils daily. (Patient not taking: Reported on 09/07/2023)   No facility-administered encounter medications on file as of 09/07/2023.    Allergies (verified) Sulfa antibiotics, Seroquel  [quetiapine ], and Sulfonamide derivatives   History: Past Medical History:  Diagnosis Date   Anxiety    Arthritis    Depression    GERD (gastroesophageal reflux disease)    History of hiatal hernia    Hypercholesteremia    Hypertension    Hypothyroidism    Pain management contract signed 02/06/2017   The current plan is that she will use hydrocodone  for pain management until the time of her surgery which is planned for approximately 2 months from now.     Past Surgical History:  Procedure Laterality Date   ABDOMINAL HYSTERECTOMY     BIOPSY  08/31/2022   Procedure: BIOPSY;  Surgeon: Eartha Angelia Sieving, MD;  Location: [facility];  Service: Gastroenterology;;   BIOPSY  03/02/2023   Procedure: BIOPSY;  Surgeon: Eartha Angelia, Sieving, MD;  Location: [facility];  Service: Gastroenterology;;   BREAST LUMPECTOMY     benign   CARDIAC CATHETERIZATION     9 YRS AGO   COLONOSCOPY N/A 05/30/2012   Procedure: COLONOSCOPY;  Surgeon: Claudis RAYMOND Rivet, MD;  Location: [facility];  Service: Endoscopy;  Laterality: N/A;  830-moved to 1125 Ann to notify pt   COLONOSCOPY WITH PROPOFOL  N/A 08/31/2022   Procedure: COLONOSCOPY WITH PROPOFOL ;  Surgeon: Eartha Angelia Sieving, MD;  Location: [facility];  Service: Gastroenterology;  Laterality: N/A;  12:30 pm, asa 2, pt knows to arrive at 8:45   DILATION AND CURETTAGE OF UTERUS     X2   ESOPHAGEAL BANDING N/A 03/02/2023   Procedure: ESOPHAGEAL BANDING;  Surgeon: Eartha Angelia Sieving, MD;  Location: [facility];  Service: Gastroenterology;  Laterality: N/A;  2:00PM;ASA 2; GAVE BANDING   ESOPHAGOGASTRODUODENOSCOPY  (EGD) WITH PROPOFOL  N/A 08/31/2022   Procedure: ESOPHAGOGASTRODUODENOSCOPY (EGD) WITH PROPOFOL ;  Surgeon: Eartha Angelia Sieving, MD;  [facility]: [facility];  Service: Gastroenterology;  Laterality: N/A;   ESOPHAGOGASTRODUODENOSCOPY (EGD) WITH PROPOFOL  N/A 11/03/2022   Procedure: ESOPHAGOGASTRODUODENOSCOPY (EGD) WITH PROPOFOL ;  Surgeon: Eartha Angelia Sieving, MD;  [facility]: [facility];  Service: Gastroenterology;  Laterality: N/A;  12:00pm;asa 1   ESOPHAGOGASTRODUODENOSCOPY (EGD) WITH PROPOFOL  N/A 03/02/2023   Procedure: ESOPHAGOGASTRODUODENOSCOPY (EGD) WITH PROPOFOL ;  Surgeon: Eartha Angelia Sieving, MD;  [facility]: [facility];  Service: Gastroenterology;  Laterality: N/A;  2:00PM;ASA 2   HOT HEMOSTASIS  11/03/2022   Procedure: HOT HEMOSTASIS (ARGON PLASMA COAGULATION/BICAP);  Surgeon: Eartha Angelia, Sieving, MD;  Location: [facility];  Service: Gastroenterology;;   JOINT REPLACEMENT Right    right hip   Right Carpal Tunnel Release     ROBOTIC ASSISTED LAPAROSCOPIC CHOLECYSTECTOMY-MULTI SITE N/A 06/27/2022  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   Procedure: LEFT TOTAL KNEE ARTHROPLASTY;  Surgeon: Dempsey Melodi GAILS, MD;  Location: [facility];  Service: Orthopedics;  Laterality: Left;   TOTAL SHOULDER ARTHROPLASTY Right 06/17/2016   TOTAL SHOULDER ARTHROPLASTY Right 06/17/2016   Procedure: RIGHT TOTAL SHOULDER ARTHROPLASTY;  Surgeon: Marcey Her, MD;  Location: [facility];  Service: Orthopedics;  Laterality: Right;   TUBAL LIGATION     Family History  Problem Relation Age of Onset   Dementia Mother    Hypertension Mother    Lupus Mother    Fibromyalgia Sister    Depression Sister    Bipolar disorder Daughter    Drug abuse Daughter    Breast cancer Maternal Aunt    Breast cancer Cousin    Breast cancer Cousin    Colon cancer Neg Hx    Social History   Socioeconomic History   Marital status: Married    Spouse name: Ozell   Number of children: 3   Years of education:  Not on file   Highest education level: Some college, no degree  Occupational History   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  Tobacco Use   Smoking status: Never   Smokeless tobacco: Never  Vaping Use   Vaping status: Never Used  Substance and Sexual Activity   Alcohol use: No   Drug use: No   Sexual activity: Yes    Birth control/protection: Surgical    Comment: hyst  Other Topics Concern   Not on file  Social History Narrative   3 grandchildren live with her and her husband full time - she has been their full time caregiver since 2019   One of her daughters lives nearby   Social Drivers of Health   Financial Resource Strain: Medium Risk (08/31/2023)   Overall Financial Resource Strain (CARDIA)    Difficulty of Paying Living Expenses: Somewhat hard  Food Insecurity: No Food Insecurity (08/31/2023)   Hunger Vital Sign    Worried About Running Out of Food in the Last Year: Never true    Ran Out of Food in the Last Year: Never true  Transportation Needs: No Transportation Needs (08/31/2023)   PRAPARE - Administrator, Civil Service (Medical): No    Lack of Transportation (Non-Medical): No  Physical Activity: Inactive (08/31/2023)   Exercise Vital Sign    Days of Exercise per Week: 0 days    Minutes of Exercise per Session: Not on file  Stress: No Stress Concern Present (08/31/2023)   [employer] of Occupational Health - Occupational Stress Questionnaire    Feeling of Stress: Only a little  Social Connections: Moderately Integrated (08/31/2023)   Social Connection and Isolation Panel    Frequency of Communication with Friends and Family: Three times a week    Frequency of Social Gatherings with Friends and Family: Once a week    Attends Religious Services: 1 to 4 times per year    Active Member of [employer] or Organizations: No    Attends Engineer, structural: Not on file    Marital Status: Married    Tobacco Counseling Counseling given:  Yes    Clinical Intake:  Pre-visit preparation completed: Yes  Pain : 0-10 (r-hip/shoulder pain) Pain Score: 4  Pain Type: Chronic pain Pain Location: Hip (shoulder) Pain Orientation: Right Pain Descriptors / Indicators: Constant Pain Onset: More than a month ago Pain Frequency: Constant Pain Relieving Factors: tylenol   Pain Relieving Factors: tylenol   BMI - recorded: 31.42  Nutritional Status: BMI > 30  Obese Nutritional Risks: None Diabetes: No  No results found for: HGBA1C   How often do you need to have someone help you when you read instructions, pamphlets, or other written materials from your doctor or pharmacy?: 1 - Never  Interpreter Needed?: No  Information entered by :: alia t/cma   Activities of Daily Living     08/31/2023   12:39 PM 02/24/2023    1:34 PM  In your present state of health, do you have any difficulty performing the following activities:  Hearing? 0   Vision? 0   Difficulty concentrating or making decisions? 0   Walking or climbing stairs? 0   Dressing or bathing? 0   Doing errands, shopping? 0 0  Preparing Food and eating ? N   Using the Toilet? N   In the past six months, have you accidently leaked urine? Y   Do you have problems with loss of bowel control? N   Managing your Medications? N   Managing your Finances? N   Housekeeping or managing your Housekeeping? N     Patient Care Team: Zollie Lowers, MD as PCP - General (Family Medicine) Vicci Mcardle, OD (Optometry) Melodi Lerner, MD as Consulting Physician (Orthopedic Surgery) Eartha Flavors, Toribio, MD as Consulting Physician (Gastroenterology)  I have updated your Care Teams any recent Medical Services you may have received from other providers in the past year.     Assessment:   This is a routine wellness examination for Leslie Robbins.  Hearing/Vision screen Hearing Screening - Comments:: Pt denies hearing dif Vision Screening - Comments:: Pt denies vision dif/pt goes  [facility] [address] in [place], [place]/last ov 4/25   Goals Addressed             This Visit's Progress    Exercise 3x per week (30 min per time)   On track      Depression Screen     09/07/2023    3:42 PM 08/07/2023   10:01 AM 02/06/2023    9:18 AM 02/06/2023    9:11 AM 08/03/2022   10:10 AM 06/21/2022    1:56 PM 05/13/2022   12:32 PM  PHQ 2/9 Scores  PHQ - 2 Score 1 1 0 0 0 0 1  PHQ- 9 Score 5 4 4  2  0 7    Fall Risk     09/07/2023    3:35 PM 08/31/2023   12:39 PM 02/06/2023    9:18 AM 02/06/2023    9:11 AM 08/03/2022   10:10 AM  Fall Risk   Falls in the past year? 1 1 1  0 1  Number falls in past yr: 1 1 1   0  Injury with Fall? 1 0 1  0  Risk for fall due to : Impaired balance/gait;Impaired mobility  History of fall(s)  History of fall(s)  Follow up Falls evaluation completed  Falls evaluation completed  Falls evaluation completed    MEDICARE RISK AT HOME:  Medicare Risk at Home Any stairs in or around the home?: (Patient-Rptd) Yes If so, are there any without handrails?: (Patient-Rptd) No Home free of loose throw rugs in walkways, pet beds, electrical cords, etc?: (Patient-Rptd) Yes Adequate lighting in your home to reduce risk of falls?: (Patient-Rptd) Yes Life alert?: (Patient-Rptd) No Use of a cane, walker or w/c?: (Patient-Rptd) No Grab bars in the bathroom?: (Patient-Rptd) Yes Shower chair or bench in shower?: (Patient-Rptd) Yes Elevated toilet seat or a handicapped toilet?: (Patient-Rptd) No  TIMED UP AND GO:  Was the test performed?  no  Cognitive Function: 6CIT completed    02/25/2019   11:09 AM  MMSE - Mini Mental State Exam  Orientation to time 5  Orientation to Place 5  Registration 3  Attention/ Calculation 5  Recall 2  Language- name 2 objects 2  Language- repeat 1  Language- follow 3 step command 3  Language- read & follow direction 1  Write a sentence 1  Copy design 1  Total score 29        09/07/2023    3:39 PM 06/21/2022    1:58 PM 06/17/2021     8:24 AM  6CIT Screen  What Year? 0 points 0 points 0 points  What month? 0 points 0 points 0 points  What time? 0 points 0 points 0 points  Count back from 20 0 points 0 points 0 points  Months in reverse 0 points 0 points 0 points  Repeat phrase 0 points 0 points 6 points  Total Score 0 points 0 points 6 points    Immunizations Immunization History  Administered Date(s) Administered   Fluad Quad(high Dose 65+) 12/03/2018, 01/28/2020, 03/30/2021, 02/02/2022   Fluad Trivalent(High Dose 65+) 02/06/2023   Hepatitis B 04/30/2010, 05/31/2010, 10/29/2010   Influenza,inj,Quad PF,6+ Mos 12/31/2012, 12/30/2013, 01/08/2015, 11/26/2015, 12/28/2016   Influenza,inj,quad, With Preservative 12/05/2016   Influenza-Unspecified 12/28/2016   Moderna Sars-Covid-2 Vaccination 06/19/2019, 07/17/2019   Pneumococcal Conjugate-13 02/13/2019   Pneumococcal Polysaccharide-23 07/23/2020   Tdap 02/06/2023   Zoster Recombinant(Shingrix ) 03/30/2021, 02/02/2022    Screening Tests Health Maintenance  Topic Date Due   DEXA SCAN  10/01/2022   COVID-19 Vaccine (3 - Moderna risk series) 09/22/2024 (Originally 08/14/2019)   INFLUENZA VACCINE  10/06/2023   MAMMOGRAM  02/24/2024   Medicare Annual Wellness (AWV)  09/06/2024   Colonoscopy  08/31/2027   DTaP/Tdap/Td (2 - Td or Tdap) 02/05/2033   Pneumococcal Vaccine: 50+ Years  Completed   Hepatitis B Vaccines  Completed   Hepatitis C Screening  Completed   Zoster Vaccines- Shingrix   Completed   HPV VACCINES  Aged Out   Meningococcal B Vaccine  Aged Out    Health Maintenance  Health Maintenance Due  Topic Date Due   DEXA SCAN  10/01/2022   Health Maintenance Items Addressed: DEXA ordered  Additional Screening:  Vision Screening: Recommended annual ophthalmology exams for early detection of glaucoma and other disorders of the eye. Would you like a referral to an eye doctor? No    Dental Screening: Recommended annual dental exams for proper oral  hygiene  Community Resource Referral / Chronic Care Management: CRR required this visit?  No   CCM required this visit?  No   Plan:    I have personally reviewed and noted the following in the patient's chart:   Medical and social history Use of alcohol, tobacco or illicit drugs  Current medications and supplements including opioid prescriptions. Patient is not currently taking opioid prescriptions. Functional ability and status Nutritional status Physical activity Advanced directives List of other physicians Hospitalizations, surgeries, and ER visits in previous 12 months Vitals Screenings to include cognitive, depression, and falls Referrals and appointments  In addition, I have reviewed and discussed with patient certain preventive protocols, quality metrics, and best practice recommendations. A written personalized care plan for preventive services as well as general preventive health recommendations were provided to patient.   Ozie Ned, CMA   09/07/2023   After Visit Summary: (MyChart) Due to this  being a telephonic visit, the after visit summary with patients personalized plan was offered to patient via MyChart   Notes: Nothing significant to report at this time.

## 2023-09-13 ENCOUNTER — Telehealth: Payer: Self-pay

## 2023-09-13 NOTE — Telephone Encounter (Signed)
 Copied from CRM [id number]. Topic: Appointments - Scheduling Inquiry for Clinic >> Sep 13, 2023 10:08 AM [name] wrote: Reason for CRM: Patient is returning missed call about bone density scan that needs to be scheduled, patient is requesting it be done at the same time as her office visit with Dr. Zollie in December. Please advise, schedule accordingly # [phone]

## 2023-09-14 NOTE — Telephone Encounter (Signed)
 Appt made for 02/06/24 pt aware

## 2023-09-25 ENCOUNTER — Ambulatory Visit: Payer: Self-pay

## 2023-09-25 NOTE — Telephone Encounter (Signed)
 FYI Only or Action Required?: FYI only for provider.  Patient was last seen in primary care on 08/07/2023 by Zollie Lowers, MD.  Called Nurse Triage reporting Urinary Frequency.  Symptoms began several days ago.  Interventions attempted: Rest, hydration, or home remedies.  Symptoms are: unchanged.  Triage Disposition: See Physician Within 24 Hours  Patient/caregiver understands and will follow disposition?: Yes  **Appt. Scheduled for 7/22**                       Copied from CRM #[id number]. Topic: Clinical - Red Word Triage >> Sep 25, 2023  4:57 PM Nathanel BROCKS wrote: Red Word that prompted transfer to Nurse Triage: possible uti. Spasms and hurts to pee. Reason for Disposition  Urinating more frequently than usual (i.e., frequency) OR new-onset of the feeling of an urgent need to urinate (i.e., urgency)  Answer Assessment - Initial Assessment Questions 1. SYMPTOM: What's the main symptom you're concerned about? (e.g., frequency, incontinence)      Burning, bladder spasms, pain  2. ONSET: When did the  symptoms  start?     Last Saturday  3. PAIN: Is there any pain? If Yes, ask: How bad is it? (Scale: 1-10; mild, moderate, severe)     Yes, lower abdomen, vaginal area  4. CAUSE: What do you think is causing the symptoms?     UTI  5. OTHER SYMPTOMS: Do you have any other symptoms? (e.g., blood in urine, fever, flank pain, pain with urination)     No  Patient reports drinking plenty of water . She has an upcomming 7/22/ appt.  Protocols used: Urinary Symptoms-A-AH

## 2023-09-26 ENCOUNTER — Encounter: Payer: Self-pay | Admitting: Family Medicine

## 2023-09-26 ENCOUNTER — Ambulatory Visit (INDEPENDENT_AMBULATORY_CARE_PROVIDER_SITE_OTHER): Admitting: Family Medicine

## 2023-09-26 VITALS — BP 125/81 | HR 77 | Temp 98.3°F | Ht 64.0 in | Wt 189.0 lb

## 2023-09-26 DIAGNOSIS — R300 Dysuria: Secondary | ICD-10-CM

## 2023-09-26 LAB — URINALYSIS, ROUTINE W REFLEX MICROSCOPIC
Bilirubin, UA: NEGATIVE
Glucose, UA: NEGATIVE
Ketones, UA: NEGATIVE
Nitrite, UA: NEGATIVE
Protein,UA: NEGATIVE
Specific Gravity, UA: 1.015 (ref 1.005–1.030)
Urobilinogen, Ur: 0.2 mg/dL (ref 0.2–1.0)
pH, UA: 6 (ref 5.0–7.5)

## 2023-09-26 LAB — MICROSCOPIC EXAMINATION
RBC, Urine: NONE SEEN /HPF (ref 0–2)
Renal Epithel, UA: NONE SEEN /HPF
Yeast, UA: NONE SEEN

## 2023-09-26 MED ORDER — CIPROFLOXACIN HCL 500 MG PO TABS: 500.0000 mg | ORAL_TABLET | Freq: Two times a day (BID) | ORAL | 0 refills | Status: DC

## 2023-09-26 NOTE — Progress Notes (Signed)
 Subjective:  Patient ID: FARRAH SKODA, female    DOB: [birth year]  Age: 68 y.o. MRN: [id number]  CC: No chief complaint on file.   HPI Korissa Horsford Deboard presents for 3 days ago onset of pressure burning with urination and abd pain. Nocturia X 3. Not taking the omeprazole  BID due to difficulty timing the AM dose with the thyroxine.      09/26/2023    2:59 PM 09/07/2023    3:42 PM 08/07/2023   10:01 AM  Depression screen PHQ 2/9  Decreased Interest 1 0 1  Down, Depressed, Hopeless 0 1 0  PHQ - 2 Score 1 1 1   Altered sleeping 0 1 1  Tired, decreased energy 1 3 2   Change in appetite 1 0 0  Feeling bad or failure about yourself  0 0 0  Trouble concentrating 0 0 0  Moving slowly or fidgety/restless 0 0 0  Suicidal thoughts 0 0 0  PHQ-9 Score 3 5 4   Difficult doing work/chores Not difficult at all      History Daisey has a past medical history of Anxiety, Arthritis, Depression, GERD (gastroesophageal reflux disease), History of hiatal hernia, Hypercholesteremia, Hypertension, Hypothyroidism, and Pain management contract signed (02/06/2017).   She has a past surgical history that includes Abdominal hysterectomy; Tubal ligation; Right Carpal Tunnel Release; Breast lumpectomy; Colonoscopy (N/A, 05/30/2012); Dilation and curettage of uterus; Cardiac catheterization; Total knee arthroplasty (Left, 09/16/2013); Joint replacement (Right); Total shoulder arthroplasty (Right, 06/17/2016); Total shoulder arthroplasty (Right, 06/17/2016); Robotic assisted laparoscopic cholecystectomy-multi site (N/A, 06/27/2022); Colonoscopy with propofol  (N/A, 08/31/2022); Esophagogastroduodenoscopy (egd) with propofol  (N/A, 08/31/2022); biopsy (08/31/2022); Esophagogastroduodenoscopy (egd) with propofol  (N/A, 11/03/2022); Hot hemostasis (11/03/2022); Esophagogastroduodenoscopy (egd) with propofol  (N/A, 03/02/2023); esophageal banding (N/A, 03/02/2023); and biopsy (03/02/2023).   Her family history includes Bipolar disorder in her  daughter; Breast cancer in her cousin, cousin, and maternal aunt; Dementia in her mother; Depression in her sister; Drug abuse in her daughter; Fibromyalgia in her sister; Hypertension in her mother; Lupus in her mother.She reports that she has never smoked. She has never used smokeless tobacco. She reports that she does not drink alcohol and does not use drugs.    ROS Review of Systems  Constitutional:  Negative for chills, diaphoresis and fever.  HENT:  Negative for congestion.   Eyes:  Negative for visual disturbance.  Respiratory:  Negative for cough and shortness of breath.   Cardiovascular:  Negative for chest pain and palpitations.  Gastrointestinal:  Positive for constipation (Does well if using miralax  2-3 times a week on a prn basis). Negative for diarrhea and nausea.  Genitourinary:  Positive for dysuria, frequency and urgency. Negative for decreased urine volume, flank pain, hematuria, menstrual problem and pelvic pain.  Musculoskeletal:  Negative for arthralgias and joint swelling.  Skin:  Negative for rash.  Neurological:  Negative for dizziness and numbness.    Objective:  BP 125/81   Pulse 77   Temp 98.3 F (36.8 C)   Ht 5' 4 (1.626 m)   Wt 189 lb (85.7 kg)   SpO2 95%   BMI 32.44 kg/m   BP Readings from Last 3 Encounters:  09/26/23 125/81  09/07/23 98/66  08/07/23 98/66    Wt Readings from Last 3 Encounters:  09/26/23 189 lb (85.7 kg)  09/07/23 183 lb (83 kg)  08/07/23 183 lb (83 kg)     Physical Exam Constitutional:      Appearance: She is well-developed.  HENT:     Head: Normocephalic  and atraumatic.  Cardiovascular:     Rate and Rhythm: Normal rate and regular rhythm.     Heart sounds: No murmur heard. Pulmonary:     Effort: Pulmonary effort is normal.     Breath sounds: Normal breath sounds.  Abdominal:     General: Bowel sounds are normal.     Palpations: Abdomen is soft. There is no mass.     Tenderness: There is no abdominal tenderness.  There is no guarding or rebound.  Musculoskeletal:        General: No tenderness.  Skin:    General: Skin is warm and dry.  Neurological:     Mental Status: She is alert and oriented to person, place, and time.  Psychiatric:        Behavior: Behavior normal.      Assessment & Plan:  Dysuria -     Urinalysis, Routine w reflex microscopic -     Urine Culture  Other orders -     Microscopic Examination -     Ciprofloxacin  HCl; Take 1 tablet (500 mg total) by mouth 2 (two) times daily.  Dispense: 14 tablet; Refill: 0     Follow-up: Return if symptoms worsen or fail to improve.  Butler Der, M.D.

## 2023-09-26 NOTE — Telephone Encounter (Signed)
 Appt. Scheduled for 7/22

## 2023-09-27 LAB — URINE CULTURE

## 2023-10-11 ENCOUNTER — Encounter: Payer: Self-pay | Admitting: Family Medicine

## 2023-10-11 ENCOUNTER — Ambulatory Visit (INDEPENDENT_AMBULATORY_CARE_PROVIDER_SITE_OTHER)

## 2023-10-11 ENCOUNTER — Ambulatory Visit (INDEPENDENT_AMBULATORY_CARE_PROVIDER_SITE_OTHER): Admitting: Family Medicine

## 2023-10-11 ENCOUNTER — Ambulatory Visit: Payer: Self-pay | Admitting: Family Medicine

## 2023-10-11 VITALS — BP 125/82 | HR 76 | Temp 97.3°F | Ht 64.0 in | Wt 190.2 lb

## 2023-10-11 DIAGNOSIS — R300 Dysuria: Secondary | ICD-10-CM | POA: Diagnosis not present

## 2023-10-11 DIAGNOSIS — M25511 Pain in right shoulder: Secondary | ICD-10-CM | POA: Diagnosis not present

## 2023-10-11 DIAGNOSIS — M25512 Pain in left shoulder: Secondary | ICD-10-CM | POA: Diagnosis not present

## 2023-10-11 DIAGNOSIS — M19012 Primary osteoarthritis, left shoulder: Secondary | ICD-10-CM | POA: Diagnosis not present

## 2023-10-11 LAB — URINALYSIS, COMPLETE
Bilirubin, UA: NEGATIVE
Glucose, UA: NEGATIVE
Ketones, UA: NEGATIVE
Nitrite, UA: NEGATIVE
Protein,UA: NEGATIVE
Specific Gravity, UA: 1.015 (ref 1.005–1.030)
Urobilinogen, Ur: 2 mg/dL — ABNORMAL HIGH (ref 0.2–1.0)
pH, UA: 7 (ref 5.0–7.5)

## 2023-10-11 LAB — MICROSCOPIC EXAMINATION
Renal Epithel, UA: NONE SEEN /HPF
Yeast, UA: NONE SEEN

## 2023-10-11 MED ORDER — BETAMETHASONE SOD PHOS & ACET 6 (3-3) MG/ML IJ SUSP
6.0000 mg | Freq: Once | INTRAMUSCULAR | Status: AC
  Administered 2023-10-11: 6 mg via INTRAMUSCULAR

## 2023-10-11 NOTE — Progress Notes (Signed)
 Subjective:  Patient ID: Leslie Robbins, female    DOB: 1955/09/29  Age: 68 y.o. MRN: [id number]  CC: Shoulder Pain (LEFT SHOULDER AND ARM PAIN X 1 MONTH, NO KNOWN INJURY)   HPI Leslie Robbins presents for pain with abduction. Has to do passive motion with right arm to abduct left.  Onset was about a month ago.  She has no known injury.  There is some soreness anteriorly.  The pain is over the ball of the arm particularly with abduction.  It does not hurt at rest.  She says the pain radiates to the neck intermittently as well as the superior trapezius.     10/11/2023    9:28 AM 09/26/2023    2:59 PM 09/07/2023    3:42 PM  Depression screen PHQ 2/9  Decreased Interest 1 1 0  Down, Depressed, Hopeless 0 0 1  PHQ - 2 Score 1 1 1   Altered sleeping 1 0 1  Tired, decreased energy 2 1 3   Change in appetite 2 1 0  Feeling bad or failure about yourself  0 0 0  Trouble concentrating 0 0 0  Moving slowly or fidgety/restless 0 0 0  Suicidal thoughts 0 0 0  PHQ-9 Score 6 3 5   Difficult doing work/chores Not difficult at all Not difficult at all     History Leslie Robbins has a past medical history of Anxiety, Arthritis, Depression, GERD (gastroesophageal reflux disease), History of hiatal hernia, Hypercholesteremia, Hypertension, Hypothyroidism, and Pain management contract signed (02/06/2017).   She has a past surgical history that includes Abdominal hysterectomy; Tubal ligation; Right Carpal Tunnel Release; Breast lumpectomy; Colonoscopy (N/A, 05/30/2012); Dilation and curettage of uterus; Cardiac catheterization; Total knee arthroplasty (Left, 09/16/2013); Joint replacement (Right); Total shoulder arthroplasty (Right, 06/17/2016); Total shoulder arthroplasty (Right, 06/17/2016); Robotic assisted laparoscopic cholecystectomy-multi site (N/A, 06/27/2022); Colonoscopy with propofol  (N/A, 08/31/2022); Esophagogastroduodenoscopy (egd) with propofol  (N/A, 08/31/2022); biopsy (08/31/2022); Esophagogastroduodenoscopy  (egd) with propofol  (N/A, 11/03/2022); Hot hemostasis (11/03/2022); Esophagogastroduodenoscopy (egd) with propofol  (N/A, 03/02/2023); esophageal banding (N/A, 03/02/2023); and biopsy (03/02/2023).   Her family history includes Bipolar disorder in her daughter; Breast cancer in her cousin, cousin, and maternal aunt; Dementia in her mother; Depression in her sister; Drug abuse in her daughter; Fibromyalgia in her sister; Hypertension in her mother; Lupus in her mother.She reports that she has never smoked. She has never used smokeless tobacco. She reports that she does not drink alcohol and does not use drugs.    ROS Review of Systems  Constitutional: Negative.   HENT: Negative.    Eyes:  Negative for visual disturbance.  Respiratory:  Negative for shortness of breath.   Cardiovascular:  Negative for chest pain.  Gastrointestinal:  Negative for abdominal pain.  Genitourinary:  Positive for dysuria (Notices occasional mild pressure.  Her main concern is of course that she was recently treated.  She wants to be sure the infection is entirely gone.).  Musculoskeletal:  Negative for arthralgias.    Objective:  BP 125/82   Pulse 76   Temp (!) 97.3 F (36.3 C)   Ht 5' 4 (1.626 m)   Wt 190 lb 3.2 oz (86.3 kg)   SpO2 96%   BMI 32.65 kg/m   BP Readings from Last 3 Encounters:  10/11/23 125/82  09/26/23 125/81  09/07/23 98/66    Wt Readings from Last 3 Encounters:  10/11/23 190 lb 3.2 oz (86.3 kg)  09/26/23 189 lb (85.7 kg)  09/07/23 183 lb (83 kg)  Physical Exam Constitutional:      General: She is not in acute distress.    Appearance: She is obese. She is not ill-appearing.  Cardiovascular:     Rate and Rhythm: Normal rate and regular rhythm.  Pulmonary:     Breath sounds: Normal breath sounds.  Musculoskeletal:        General: Tenderness (Lateral aspect of left shoulder joint.  Range of motion restricted for abduction.  However there is full range of motion for flexion and  extension.  Shoulder elevation is normal as well.  Elbow and wrist motion including pronation supination are normal.) present.  Skin:    General: Skin is warm and dry.      Assessment & Plan:  Acute pain of right shoulder -     Betamethasone  Sod Phos & Acet -     DG Shoulder Left; Future  Dysuria -     Urinalysis, Complete -     Urine Culture   This is likely arthritis related.  X-rays are currently pending.  She is not a candidate for NSAIDs.  We discussed over-the-counter Tylenol  500 mg 2 tablets 3 times a day on schedule.  Follow-up: Return if symptoms worsen or fail to improve.  Butler Der, M.D.

## 2023-10-13 ENCOUNTER — Encounter: Payer: Self-pay | Admitting: Family Medicine

## 2023-10-13 ENCOUNTER — Telehealth: Payer: Self-pay | Admitting: Family Medicine

## 2023-10-13 LAB — URINE CULTURE

## 2023-10-13 NOTE — Telephone Encounter (Signed)
 I called and spoke with patient and she says she saw her results on her mychart and wants to know if Dr Zollie thinks he should send in another round of antibiotics so that the bacteria she still has, doesn't grow. Made her aware that we havent received her xray results yet.    Copied from CRM [id number]. Topic: Clinical - Lab/Test Results >> Oct 13, 2023  9:22 AM Leonette SQUIBB wrote: Reason for CRM: pt called asking about her xray results and her lab result result.  She cannot get into he mychart.

## 2023-10-13 NOTE — Telephone Encounter (Signed)
 Your urine cultue did not grow anything so should not need another antibiotic

## 2023-10-16 NOTE — Telephone Encounter (Signed)
 Patient aware. She is feeling better. Will call if symptoms  return.

## 2023-10-16 NOTE — Telephone Encounter (Signed)
 Few bacteria when noted on urinalysis and mixed urogenital flora when noted on a urine culture are typical findings that do not represent infection.  I do not recommend treating those findings.  If she is having symptoms then we can see her back collect a new culture and consider treatment based on those symptoms.

## 2023-10-17 NOTE — Telephone Encounter (Signed)
 Patient aware and verbalized understanding.

## 2023-10-25 ENCOUNTER — Telehealth: Payer: Self-pay

## 2023-10-25 NOTE — Telephone Encounter (Signed)
CALLED PATIENT, NO ANSWER, LEFT MESSAGE TO RETURN CALL 

## 2023-10-25 NOTE — Telephone Encounter (Signed)
 Copied from CRM [id number]. Topic: Clinical - Lab/Test Results >> Oct 13, 2023  9:22 AM Leonette SQUIBB wrote: Reason for CRM: pt called asking about her xray results and her lab result result.  She cannot get into he mychart. >> Oct 25, 2023 11:59 AM Gustabo D wrote: Patient is calling back to get the results to her labs and x ray

## 2023-10-25 NOTE — Telephone Encounter (Signed)
 Called patient, no answer, mailbox full

## 2023-10-26 NOTE — Telephone Encounter (Signed)
 Left message for patient to call back to get her lab and xray results if she hasn't received them by now.

## 2023-10-26 NOTE — Telephone Encounter (Signed)
 Left message for patient to call back to get her lab and xray results, if she has not received them by now.

## 2023-10-27 ENCOUNTER — Other Ambulatory Visit (INDEPENDENT_AMBULATORY_CARE_PROVIDER_SITE_OTHER): Payer: Self-pay | Admitting: Gastroenterology

## 2023-10-27 ENCOUNTER — Other Ambulatory Visit: Payer: Self-pay | Admitting: Family Medicine

## 2023-10-27 DIAGNOSIS — E785 Hyperlipidemia, unspecified: Secondary | ICD-10-CM

## 2023-10-27 DIAGNOSIS — K219 Gastro-esophageal reflux disease without esophagitis: Secondary | ICD-10-CM

## 2023-11-15 ENCOUNTER — Institutional Professional Consult (permissible substitution) (INDEPENDENT_AMBULATORY_CARE_PROVIDER_SITE_OTHER): Admitting: Otolaryngology

## 2023-12-15 DIAGNOSIS — M542 Cervicalgia: Secondary | ICD-10-CM | POA: Diagnosis not present

## 2023-12-15 DIAGNOSIS — M25512 Pain in left shoulder: Secondary | ICD-10-CM | POA: Diagnosis not present

## 2023-12-20 ENCOUNTER — Encounter (INDEPENDENT_AMBULATORY_CARE_PROVIDER_SITE_OTHER): Payer: Self-pay | Admitting: Gastroenterology

## 2024-01-05 ENCOUNTER — Other Ambulatory Visit: Payer: Self-pay | Admitting: Family Medicine

## 2024-01-05 DIAGNOSIS — I10 Essential (primary) hypertension: Secondary | ICD-10-CM

## 2024-01-24 ENCOUNTER — Other Ambulatory Visit (INDEPENDENT_AMBULATORY_CARE_PROVIDER_SITE_OTHER): Payer: Self-pay | Admitting: Gastroenterology

## 2024-01-24 DIAGNOSIS — K219 Gastro-esophageal reflux disease without esophagitis: Secondary | ICD-10-CM

## 2024-01-24 NOTE — Telephone Encounter (Signed)
 needs office visit, last seen 01/24/2023, by Mitzie Boettcher, Np, No future appointments scheduled. And medication was increased to bid at [facility] in 12/24. Have patient call office for appointment.

## 2024-02-06 ENCOUNTER — Ambulatory Visit: Payer: Self-pay

## 2024-02-06 ENCOUNTER — Ambulatory Visit: Payer: Self-pay | Admitting: Family Medicine

## 2024-02-06 ENCOUNTER — Encounter: Payer: Self-pay | Admitting: Family Medicine

## 2024-02-06 VITALS — BP 114/79 | HR 78 | Temp 97.9°F | Ht 64.0 in | Wt 195.0 lb

## 2024-02-06 DIAGNOSIS — Z780 Asymptomatic menopausal state: Secondary | ICD-10-CM | POA: Diagnosis not present

## 2024-02-06 DIAGNOSIS — Z23 Encounter for immunization: Secondary | ICD-10-CM

## 2024-02-06 DIAGNOSIS — I10 Essential (primary) hypertension: Secondary | ICD-10-CM

## 2024-02-06 DIAGNOSIS — M19011 Primary osteoarthritis, right shoulder: Secondary | ICD-10-CM

## 2024-02-06 DIAGNOSIS — F418 Other specified anxiety disorders: Secondary | ICD-10-CM

## 2024-02-06 DIAGNOSIS — J0100 Acute maxillary sinusitis, unspecified: Secondary | ICD-10-CM

## 2024-02-06 DIAGNOSIS — E039 Hypothyroidism, unspecified: Secondary | ICD-10-CM

## 2024-02-06 DIAGNOSIS — Z13820 Encounter for screening for osteoporosis: Secondary | ICD-10-CM

## 2024-02-06 MED ORDER — TRIAMTERENE-HCTZ 37.5-25 MG PO TABS: 0.5000 | ORAL_TABLET | Freq: Every day | ORAL | 3 refills | Status: AC

## 2024-02-06 MED ORDER — CLORAZEPATE DIPOTASSIUM 3.75 MG PO TABS: 3.7500 mg | ORAL_TABLET | Freq: Every evening | ORAL | 5 refills | Status: AC | PRN

## 2024-02-06 MED ORDER — DULOXETINE HCL 60 MG PO CPEP: 60.0000 mg | ORAL_CAPSULE | Freq: Two times a day (BID) | ORAL | 0 refills | Status: AC

## 2024-02-06 MED ORDER — AMOXICILLIN-POT CLAVULANATE 875-125 MG PO TABS: 1.0000 | ORAL_TABLET | Freq: Two times a day (BID) | ORAL | 0 refills | Status: DC

## 2024-02-06 MED ORDER — FLUCONAZOLE 150 MG PO TABS: 150.0000 mg | ORAL_TABLET | Freq: Once | ORAL | 0 refills | Status: AC

## 2024-02-06 MED ORDER — FEXOFENADINE HCL 180 MG PO TABS: 180.0000 mg | ORAL_TABLET | Freq: Every day | ORAL | 0 refills | Status: AC

## 2024-02-06 MED ORDER — AMLODIPINE BESYLATE 5 MG PO TABS: 5.0000 mg | ORAL_TABLET | Freq: Every day | ORAL | 0 refills | Status: AC

## 2024-02-06 NOTE — Progress Notes (Signed)
 Subjective:  Patient ID: KAMARIA LUCIA, female    DOB: [birth year]  Age: 68 y.o. MRN: [id number]  CC: Form Completion (Left shoulder clearance.)   HPI  Discussed the use of AI scribe software for clinical note transcription with the patient, who gave verbal consent to proceed.  History of Present Illness SINAYA MINOGUE is a 68 year old female with severe arthritis who presents for a preoperative evaluation.  She reports severe arthritis in her shoulder and states that an MRI was performed. She is scheduled for joint replacement surgery in February. She experiences difficulty walking due to hip pain and plans to consult with another doctor for further evaluation. She reports that all her joints will be replaced except for the smaller ones.  She occasionally uses a Ventolin  HFA inhaler, primarily during colds, which she had recently for about two weeks. During that period, she used the inhaler several times but typically uses it only a couple of times a month.  Her current medications include estradiol  cream and Prilosec. She was previously on ciprofloxacin  and fluconazole  but is no longer taking them. Iron tablets were discontinued by her healthcare provider.  She reports her ears feel stopped up intermittently, possibly due to sinus issues, with more pronounced symptoms in the left ear. They do not pop as expected.  She is concerned about her weight and is considering options for weight loss.  No heartburn, indigestion, or tarry black bowel movements. No current use of iron tablets and her blood pressure is under good control with amlodipine .    follow-up on  thyroid . The patient has a history of hypothyroidism for many years. It has been stable recently. Pt. denies any change in  voice, loss of hair, heat or cold intolerance. Energy level has been adequate to good. Patient denies constipation and diarrhea. No myxedema. Medication is as noted below. Verified that pt is taking it daily on an  empty stomach. Well tolerated.       02/06/2024   10:08 AM 10/11/2023    9:28 AM 09/26/2023    2:59 PM  Depression screen PHQ 2/9  Decreased Interest 1 1 1   Down, Depressed, Hopeless 1 0 0  PHQ - 2 Score 2 1 1   Altered sleeping 0 1 0  Tired, decreased energy 2 2 1   Change in appetite 2 2 1   Feeling bad or failure about yourself  0 0 0  Trouble concentrating 0 0 0  Moving slowly or fidgety/restless 0 0 0  Suicidal thoughts 0 0 0  PHQ-9 Score 6 6  3    Difficult doing work/chores Not difficult at all Not difficult at all Not difficult at all     Data saved with a previous flowsheet row definition    History Tasheema has a past medical history of Anxiety, Arthritis, Depression, GERD (gastroesophageal reflux disease), History of hiatal hernia, Hypercholesteremia, Hypertension, Hypothyroidism, and Pain management contract signed (02/06/2017).   She has a past surgical history that includes Abdominal hysterectomy; Tubal ligation; Right Carpal Tunnel Release; Breast lumpectomy; Colonoscopy (N/A, 05/30/2012); Dilation and curettage of uterus; Cardiac catheterization; Total knee arthroplasty (Left, 09/16/2013); Joint replacement (Right); Total shoulder arthroplasty (Right, 06/17/2016); Total shoulder arthroplasty (Right, 06/17/2016); Robotic assisted laparoscopic cholecystectomy-multi site (N/A, 06/27/2022); Colonoscopy with propofol  (N/A, 08/31/2022); Esophagogastroduodenoscopy (egd) with propofol  (N/A, 08/31/2022); biopsy (08/31/2022); Esophagogastroduodenoscopy (egd) with propofol  (N/A, 11/03/2022); Hot hemostasis (11/03/2022); Esophagogastroduodenoscopy (egd) with propofol  (N/A, 03/02/2023); esophageal banding (N/A, 03/02/2023); and biopsy (03/02/2023).   Her family history includes Bipolar disorder  in her daughter; Breast cancer in her cousin, cousin, and maternal aunt; Dementia in her mother; Depression in her sister; Drug abuse in her daughter; Fibromyalgia in her sister; Hypertension in her mother;  Lupus in her mother.She reports that she has never smoked. She has never used smokeless tobacco. She reports that she does not drink alcohol and does not use drugs.    ROS Review of Systems  Objective:  BP 114/79   Pulse 78   Temp 97.9 F (36.6 C)   Ht 5' 4 (1.626 m)   Wt 195 lb (88.5 kg)   SpO2 97%   BMI 33.47 kg/m   BP Readings from Last 3 Encounters:  02/06/24 114/79  10/11/23 125/82  09/26/23 125/81    Wt Readings from Last 3 Encounters:  02/06/24 195 lb (88.5 kg)  10/11/23 190 lb 3.2 oz (86.3 kg)  09/26/23 189 lb (85.7 kg)     Physical Exam Physical Exam GENERAL: Alert, cooperative, well developed, no acute distress. HEENT: Normocephalic, normal oropharynx, moist mucous membranes. Fluid in ears, primarily left, indicating low-grade infection. CHEST: Clear to auscultation bilaterally. No wheezes, rhonchi, or crackles. CARDIOVASCULAR: Normal heart rate and rhythm, S1 and S2 normal without murmurs. ABDOMEN: Soft, non-tender, non-distended, without organomegaly. Normal bowel sounds. EXTREMITIES: No cyanosis or edema. NEUROLOGICAL: Cranial nerves grossly intact. Moves all extremities without gross motor or sensory deficit.   Assessment & Plan:  Primary osteoarthritis of right shoulder  Essential hypertension -     Triamterene -HCTZ; Take 0.5 tablets by mouth daily. As needed for swelling  Dispense: 45 tablet; Refill: 3 -     amLODIPine  Besylate; Take 1 tablet (5 mg total) by mouth daily.  Dispense: 90 tablet; Refill: 0 -     CBC with Differential/Platelet -     CMP14+EGFR -     TSH + free T4  Depression with anxiety -     DULoxetine  HCl; Take 1 capsule (60 mg total) by mouth 2 (two) times daily.  Dispense: 180 capsule; Refill: 0 -     Clorazepate  Dipotassium; Take 1 tablet (3.75 mg total) by mouth at bedtime as needed for anxiety.  Dispense: 30 tablet; Refill: 5  Encounter for immunization -     Flu vaccine HIGH DOSE PF(Fluzone Trivalent)  Hypothyroidism,  unspecified type -     CBC with Differential/Platelet -     CMP14+EGFR -     TSH + free T4  Acute maxillary sinusitis, recurrence not specified  Other orders -     Fexofenadine  HCl; Take 1 tablet (180 mg total) by mouth daily. For allergy symptoms  Dispense: 90 tablet; Refill: 0 -     Amoxicillin -Pot Clavulanate; Take 1 tablet by mouth 2 (two) times daily. Take all of this medication  Dispense: 20 tablet; Refill: 0 -     Fluconazole ; Take 1 tablet (150 mg total) by mouth once for 1 dose. At onset of symptoms. Repeat at end of treatment  Dispense: 2 tablet; Refill: 0    Assessment and Plan Assessment & Plan Preoperative evaluation for severe osteoarthritis of left shoulder   Severe osteoarthritis of the left shoulder requires surgical intervention due to significant joint damage shown on MRI. Surgery is planned for February, pending scheduling. Preoperative clearance is necessary for optimal outcomes. A CBC is ordered to assess hemoglobin levels and rule out anemia. A thyroid  function test is ordered to ensure optimal thyroid  levels for healing. Discontinue ciprofloxacin  and fluconazole  prior to surgery. Continue current medications except those affecting blood  thinning. Plan for preoperative and postoperative EKG as needed by the anesthesia team.  Osteoarthritis of hip   Severe osteoarthritis of the hip causes significant mobility issues, with difficulty walking reported. Further evaluation and management by an orthopedic specialist are required. A referral to an orthopedic specialist is made.  Eustachian tube dysfunction with possible middle ear effusion, bilateral   Bilateral Eustachian tube dysfunction with possible middle ear effusion is more pronounced on the left side. Symptoms include ear fullness and inability to equalize ear pressure. Fluconazole  is prescribed to prevent yeast infection post-antibiotic treatment. Follow-up is advised if symptoms persist after the antibiotic  course.  Essential hypertension   Blood pressure is well-controlled with the current medication regimen. Continue the current antihypertensive medication regimen.  Gastroesophageal reflux disease   GERD is well-managed with current medication. No recent episodes of heartburn or indigestion are reported. Continue the current GERD medication regimen.       Follow-up: Return in about 6 months (around 08/06/2024).  Butler Der, M.D.

## 2024-02-07 ENCOUNTER — Ambulatory Visit: Payer: Self-pay | Admitting: Family Medicine

## 2024-02-07 LAB — CMP14+EGFR
ALT: 38 IU/L — ABNORMAL HIGH (ref 0–32)
AST: 35 IU/L (ref 0–40)
Albumin: 3.9 g/dL (ref 3.9–4.9)
Alkaline Phosphatase: 143 IU/L — ABNORMAL HIGH (ref 49–135)
BUN/Creatinine Ratio: 20 (ref 12–28)
BUN: 23 mg/dL (ref 8–27)
Bilirubin Total: 0.8 mg/dL (ref 0.0–1.2)
CO2: 25 mmol/L (ref 20–29)
Calcium: 10.3 mg/dL (ref 8.7–10.3)
Chloride: 100 mmol/L (ref 96–106)
Creatinine, Ser: 1.14 mg/dL — ABNORMAL HIGH (ref 0.57–1.00)
Globulin, Total: 2.3 g/dL (ref 1.5–4.5)
Glucose: 85 mg/dL (ref 70–99)
Potassium: 4.5 mmol/L (ref 3.5–5.2)
Sodium: 139 mmol/L (ref 134–144)
Total Protein: 6.2 g/dL (ref 6.0–8.5)
eGFR: 52 mL/min/1.73 — ABNORMAL LOW (ref >59)

## 2024-02-07 LAB — CBC WITH DIFFERENTIAL/PLATELET
Basophils Absolute: 0 x10E3/uL (ref 0.0–0.2)
Basos: 1 %
EOS (ABSOLUTE): 0.2 x10E3/uL (ref 0.0–0.4)
Eos: 3 %
Hematocrit: 45.2 % (ref 34.0–46.6)
Hemoglobin: 14.3 g/dL (ref 11.1–15.9)
Immature Grans (Abs): 0 x10E3/uL (ref 0.0–0.1)
Immature Granulocytes: 0 %
Lymphocytes Absolute: 1.4 x10E3/uL (ref 0.7–3.1)
Lymphs: 24 %
MCH: 28.3 pg (ref 26.6–33.0)
MCHC: 31.6 g/dL (ref 31.5–35.7)
MCV: 89 fL (ref 79–97)
Monocytes Absolute: 0.5 x10E3/uL (ref 0.1–0.9)
Monocytes: 8 %
Neutrophils Absolute: 3.6 x10E3/uL (ref 1.4–7.0)
Neutrophils: 64 %
Platelets: 183 x10E3/uL (ref 150–450)
RBC: 5.06 x10E6/uL (ref 3.77–5.28)
RDW: 14.7 % (ref 11.7–15.4)
WBC: 5.7 x10E3/uL (ref 3.4–10.8)

## 2024-02-07 LAB — TSH+FREE T4
Free T4: 1.51 ng/dL (ref 0.82–1.77)
TSH: 0.352 u[IU]/mL — AB (ref 0.450–4.500)

## 2024-02-07 NOTE — Progress Notes (Signed)
Hello Lyrica,  Your lab result is normal and/or stable.Some minor variations that are not significant are commonly marked abnormal, but do not represent any medical problem for you.  Best regards, Claretta Fraise, M.D.

## 2024-02-13 ENCOUNTER — Ambulatory Visit: Payer: Self-pay | Admitting: Family Medicine

## 2024-02-13 NOTE — Progress Notes (Signed)
DEXA shows osteopenia. I recommend weekly fosamax. ?Nurse, if pt. Is agreeable, send in Fosamax 70 mg weekly, #13. ? ?Thanks, ?WS ?

## 2024-02-16 ENCOUNTER — Encounter: Payer: Self-pay | Admitting: Family Medicine

## 2024-02-16 ENCOUNTER — Telehealth: Payer: Self-pay | Admitting: Family Medicine

## 2024-02-16 NOTE — Telephone Encounter (Signed)
 Copied from CRM #[id number]. Topic: Clinical - Lab/Test Results >> Feb 16, 2024  2:12 PM Fredrica W wrote: Reason for CRM: Patient returned missed calls for results. DEXA. Called [facility] - Leah and Dr Zollie are out. Advised to send CRM. Thank You

## 2024-02-16 NOTE — Telephone Encounter (Signed)
Refer to result note 

## 2024-02-19 ENCOUNTER — Other Ambulatory Visit: Payer: Self-pay | Admitting: Family Medicine

## 2024-02-19 ENCOUNTER — Telehealth: Payer: Self-pay

## 2024-02-19 MED ORDER — AMOXICILLIN-POT CLAVULANATE 875-125 MG PO TABS: 1.0000 | ORAL_TABLET | Freq: Two times a day (BID) | ORAL | 0 refills | Status: DC

## 2024-02-19 NOTE — Telephone Encounter (Signed)
 The augmentin  will not cause bleeding. Tylenol  is the only medication she should take for arthritis

## 2024-02-19 NOTE — Telephone Encounter (Signed)
 I sent a refill of augmentin  to [facility]

## 2024-02-19 NOTE — Telephone Encounter (Signed)
 Copied from CRM [id number]. Topic: Clinical - Medical Advice >> Feb 19, 2024 10:37 AM Graeme ORN wrote: Reason for CRM: Patient called. Dr Zollie told her to call back after completing medication. States she completed medication but is still having concerns will ears. They pop and the get stuffed back up. Thank You

## 2024-02-20 NOTE — Telephone Encounter (Signed)
 Pt informed    LS

## 2024-04-04 ENCOUNTER — Telehealth: Payer: Self-pay | Admitting: Family Medicine

## 2024-04-04 NOTE — Telephone Encounter (Signed)
 [facility] FAXED SURGICAL CLEARANCE FORM AND I PUT IT ON NURSE DESK

## 2024-04-09 ENCOUNTER — Other Ambulatory Visit: Payer: Self-pay | Admitting: Family Medicine

## 2024-08-06 ENCOUNTER — Ambulatory Visit: Admitting: Family Medicine

## 2024-09-10 ENCOUNTER — Ambulatory Visit: Payer: Self-pay
